# Patient Record
Sex: Male | Born: 1940
Health system: Southern US, Community
[De-identification: ages and names within clinical notes are randomized; demographics above are authoritative.]

## PROBLEM LIST (undated history)

## (undated) DIAGNOSIS — T782XXA Anaphylactic shock, unspecified, initial encounter: Secondary | ICD-10-CM

## (undated) DIAGNOSIS — F17200 Nicotine dependence, unspecified, uncomplicated: Secondary | ICD-10-CM

## (undated) DIAGNOSIS — I6529 Occlusion and stenosis of unspecified carotid artery: Secondary | ICD-10-CM

## (undated) DIAGNOSIS — IMO0002 Reserved for concepts with insufficient information to code with codable children: Secondary | ICD-10-CM

## (undated) DIAGNOSIS — I10 Essential (primary) hypertension: Secondary | ICD-10-CM

## (undated) DIAGNOSIS — J449 Chronic obstructive pulmonary disease, unspecified: Secondary | ICD-10-CM

## (undated) DIAGNOSIS — I739 Peripheral vascular disease, unspecified: Secondary | ICD-10-CM

## (undated) DIAGNOSIS — Z86010 Personal history of colonic polyps: Secondary | ICD-10-CM

## (undated) DIAGNOSIS — K5790 Diverticulosis of intestine, part unspecified, without perforation or abscess without bleeding: Secondary | ICD-10-CM

## (undated) DIAGNOSIS — R892 Abnormal level of other drugs, medicaments and biological substances in specimens from other organs, systems and tissues: Secondary | ICD-10-CM

## (undated) DIAGNOSIS — Z9889 Other specified postprocedural states: Secondary | ICD-10-CM

## (undated) DIAGNOSIS — Z87442 Personal history of urinary calculi: Secondary | ICD-10-CM

## (undated) DIAGNOSIS — K222 Esophageal obstruction: Secondary | ICD-10-CM

## (undated) DIAGNOSIS — K5792 Diverticulitis of intestine, part unspecified, without perforation or abscess without bleeding: Secondary | ICD-10-CM

## (undated) DIAGNOSIS — M1990 Unspecified osteoarthritis, unspecified site: Secondary | ICD-10-CM

## (undated) DIAGNOSIS — G458 Other transient cerebral ischemic attacks and related syndromes: Secondary | ICD-10-CM

## (undated) DIAGNOSIS — K219 Gastro-esophageal reflux disease without esophagitis: Secondary | ICD-10-CM

## (undated) DIAGNOSIS — D1800 Hemangioma unspecified site: Secondary | ICD-10-CM

## (undated) DIAGNOSIS — Z8719 Personal history of other diseases of the digestive system: Secondary | ICD-10-CM

## (undated) DIAGNOSIS — C61 Malignant neoplasm of prostate: Secondary | ICD-10-CM

## (undated) HISTORY — DX: Diverticulosis of intestine, part unspecified, without perforation or abscess without bleeding: K57.90

## (undated) HISTORY — DX: Essential (primary) hypertension: I10

## (undated) HISTORY — DX: Gastro-esophageal reflux disease without esophagitis: K21.9

## (undated) HISTORY — DX: Anaphylactic shock, unspecified, initial encounter: T78.2XXA

## (undated) HISTORY — DX: Personal history of urinary calculi: Z87.442

## (undated) HISTORY — DX: Other transient cerebral ischemic attacks and related syndromes: G45.8

## (undated) HISTORY — DX: Personal history of colonic polyps: Z86.010

## (undated) HISTORY — DX: Personal history of other diseases of the digestive system: Z98.890

## (undated) HISTORY — DX: Occlusion and stenosis of unspecified carotid artery: I65.29

## (undated) HISTORY — DX: Nicotine dependence, unspecified, uncomplicated: F17.200

## (undated) HISTORY — DX: Hemangioma unspecified site: D18.00

## (undated) HISTORY — DX: Esophageal obstruction: K22.2

## (undated) HISTORY — DX: Reserved for concepts with insufficient information to code with codable children: IMO0002

## (undated) HISTORY — DX: Malignant neoplasm of prostate: C61

## (undated) HISTORY — DX: Peripheral vascular disease, unspecified: I73.9

## (undated) HISTORY — DX: Chronic obstructive pulmonary disease, unspecified: J44.9

## (undated) HISTORY — DX: Personal history of other diseases of the digestive system: Z87.19

## (undated) HISTORY — DX: Abnormal level of other drugs, medicaments and biological substances in specimens from other organs, systems and tissues: R89.2

## (undated) HISTORY — DX: Unspecified osteoarthritis, unspecified site: M19.90

## (undated) HISTORY — PX: TONSILLECTOMY: SUR1361

## (undated) HISTORY — DX: Diverticulitis of intestine, part unspecified, without perforation or abscess without bleeding: K57.92

---

## 1959-12-05 HISTORY — PX: SHOULDER SURGERY: SHX246

## 2003-03-20 ENCOUNTER — Encounter: Payer: Self-pay | Admitting: Emergency Medicine

## 2003-03-20 ENCOUNTER — Emergency Department (HOSPITAL_COMMUNITY): Admission: EM | Admit: 2003-03-20 | Discharge: 2003-03-20 | Payer: Self-pay | Admitting: Emergency Medicine

## 2003-05-01 ENCOUNTER — Ambulatory Visit (HOSPITAL_COMMUNITY): Admission: RE | Admit: 2003-05-01 | Discharge: 2003-05-01 | Payer: Self-pay | Admitting: Gastroenterology

## 2003-05-14 ENCOUNTER — Encounter: Payer: Self-pay | Admitting: Gastroenterology

## 2003-05-14 ENCOUNTER — Encounter: Admission: RE | Admit: 2003-05-14 | Discharge: 2003-05-14 | Payer: Self-pay | Admitting: Gastroenterology

## 2004-03-22 ENCOUNTER — Encounter: Admission: RE | Admit: 2004-03-22 | Discharge: 2004-03-22 | Payer: Self-pay | Admitting: Family Medicine

## 2004-10-10 ENCOUNTER — Encounter: Admission: RE | Admit: 2004-10-10 | Discharge: 2004-10-10 | Payer: Self-pay | Admitting: Internal Medicine

## 2004-10-17 ENCOUNTER — Ambulatory Visit: Payer: Self-pay

## 2005-12-04 HISTORY — PX: OTHER SURGICAL HISTORY: SHX169

## 2006-02-01 ENCOUNTER — Encounter: Admission: RE | Admit: 2006-02-01 | Discharge: 2006-02-01 | Payer: Self-pay | Admitting: Internal Medicine

## 2006-10-01 ENCOUNTER — Ambulatory Visit (HOSPITAL_COMMUNITY): Admission: RE | Admit: 2006-10-01 | Discharge: 2006-10-01 | Payer: Self-pay | Admitting: Gastroenterology

## 2006-12-04 DIAGNOSIS — K5790 Diverticulosis of intestine, part unspecified, without perforation or abscess without bleeding: Secondary | ICD-10-CM

## 2006-12-04 HISTORY — DX: Diverticulosis of intestine, part unspecified, without perforation or abscess without bleeding: K57.90

## 2007-04-09 ENCOUNTER — Encounter: Admission: RE | Admit: 2007-04-09 | Discharge: 2007-04-09 | Payer: Self-pay | Admitting: Internal Medicine

## 2007-08-26 ENCOUNTER — Encounter: Payer: Self-pay | Admitting: Internal Medicine

## 2007-08-26 ENCOUNTER — Encounter
Admission: RE | Admit: 2007-08-26 | Discharge: 2007-08-26 | Payer: Self-pay | Admitting: Physical Medicine and Rehabilitation

## 2007-09-02 ENCOUNTER — Encounter
Admission: RE | Admit: 2007-09-02 | Discharge: 2007-09-02 | Payer: Self-pay | Admitting: Physical Medicine and Rehabilitation

## 2007-09-13 ENCOUNTER — Encounter
Admission: RE | Admit: 2007-09-13 | Discharge: 2007-09-13 | Payer: Self-pay | Admitting: Physical Medicine and Rehabilitation

## 2007-10-08 ENCOUNTER — Encounter
Admission: RE | Admit: 2007-10-08 | Discharge: 2007-10-08 | Payer: Self-pay | Admitting: Physical Medicine and Rehabilitation

## 2007-10-22 ENCOUNTER — Encounter: Payer: Self-pay | Admitting: Internal Medicine

## 2008-02-21 ENCOUNTER — Ambulatory Visit: Admission: RE | Admit: 2008-02-21 | Discharge: 2008-05-21 | Payer: Self-pay | Admitting: Radiation Oncology

## 2008-08-03 ENCOUNTER — Encounter: Payer: Self-pay | Admitting: Internal Medicine

## 2008-08-11 ENCOUNTER — Encounter: Payer: Self-pay | Admitting: Internal Medicine

## 2008-09-11 ENCOUNTER — Ambulatory Visit: Payer: Self-pay | Admitting: Vascular Surgery

## 2008-09-11 ENCOUNTER — Encounter: Payer: Self-pay | Admitting: Internal Medicine

## 2008-09-11 ENCOUNTER — Encounter: Admission: RE | Admit: 2008-09-11 | Discharge: 2008-09-11 | Payer: Self-pay | Admitting: Family Medicine

## 2008-09-11 ENCOUNTER — Encounter: Payer: Self-pay | Admitting: Family Medicine

## 2008-09-11 ENCOUNTER — Ambulatory Visit: Admission: RE | Admit: 2008-09-11 | Discharge: 2008-09-11 | Payer: Self-pay | Admitting: Family Medicine

## 2008-09-22 ENCOUNTER — Encounter: Admission: RE | Admit: 2008-09-22 | Discharge: 2008-09-22 | Payer: Self-pay | Admitting: Family Medicine

## 2008-09-24 ENCOUNTER — Encounter: Payer: Self-pay | Admitting: Internal Medicine

## 2009-05-30 ENCOUNTER — Encounter: Payer: Self-pay | Admitting: Internal Medicine

## 2009-05-30 ENCOUNTER — Emergency Department (HOSPITAL_COMMUNITY): Admission: EM | Admit: 2009-05-30 | Discharge: 2009-05-30 | Payer: Self-pay | Admitting: Emergency Medicine

## 2009-06-01 ENCOUNTER — Telehealth (INDEPENDENT_AMBULATORY_CARE_PROVIDER_SITE_OTHER): Payer: Self-pay

## 2009-06-04 ENCOUNTER — Telehealth: Payer: Self-pay | Admitting: Internal Medicine

## 2009-06-04 ENCOUNTER — Encounter: Admission: RE | Admit: 2009-06-04 | Discharge: 2009-06-04 | Payer: Self-pay | Admitting: Internal Medicine

## 2009-06-04 DIAGNOSIS — K222 Esophageal obstruction: Secondary | ICD-10-CM | POA: Insufficient documentation

## 2009-06-08 ENCOUNTER — Ambulatory Visit: Payer: Self-pay | Admitting: Internal Medicine

## 2009-06-08 ENCOUNTER — Ambulatory Visit (HOSPITAL_COMMUNITY): Admission: RE | Admit: 2009-06-08 | Discharge: 2009-06-08 | Payer: Self-pay | Admitting: Internal Medicine

## 2009-06-08 HISTORY — PX: UPPER GASTROINTESTINAL ENDOSCOPY: SHX188

## 2009-06-24 ENCOUNTER — Encounter: Payer: Self-pay | Admitting: Internal Medicine

## 2009-06-30 ENCOUNTER — Encounter: Admission: RE | Admit: 2009-06-30 | Discharge: 2009-06-30 | Payer: Self-pay | Admitting: Family Medicine

## 2009-09-27 ENCOUNTER — Telehealth: Payer: Self-pay | Admitting: Internal Medicine

## 2009-12-04 DIAGNOSIS — C61 Malignant neoplasm of prostate: Secondary | ICD-10-CM

## 2009-12-04 HISTORY — DX: Malignant neoplasm of prostate: C61

## 2010-02-14 LAB — TSH: TSH: 0.783

## 2010-02-14 LAB — VITAMIN D 25 HYDROXY (VIT D DEFICIENCY, FRACTURES): Vit D, 25-Hydroxy: 25

## 2010-02-14 LAB — PSA: PSA: 1.94 ng/mL

## 2010-02-14 LAB — VITAMIN B12: Vitamin B12 Deficiency Panel: 272

## 2010-02-14 LAB — VITAMIN D PNL(25-HYDRXY+1,25-DIHY)-BLD: Vit D, 25-Hydroxy: 25

## 2010-04-07 ENCOUNTER — Ambulatory Visit: Payer: Self-pay | Admitting: Vascular Surgery

## 2010-10-04 HISTORY — PX: SPIROMETRY: SHX456

## 2010-10-19 LAB — COMPREHENSIVE METABOLIC PANEL
ALT: 20 U/L (ref 10–40)
AST: 23 U/L
Albumin: 4.4
Alkaline Phosphatase: 78 U/L
BUN: 8 mg/dL (ref 4–21)
Calcium: 9.8 mg/dL
Creat: 0.86
Glucose: 108
Potassium: 4.5 mmol/L
Sodium: 140 mmol/L (ref 137–147)
Total Bilirubin: 0.5 mg/dL
Total Protein: 6.9 g/dL

## 2010-10-19 LAB — CBC
Hemoglobin: 14.9 g/dL (ref 13.5–17.5)
WBC: 7.8
platelet count: 244

## 2010-10-19 LAB — LIPID PANEL
Cholesterol, Total: 183
Direct LDL: 90
HDL: 71 mg/dL — AB (ref 35–70)
Triglycerides: 111

## 2010-10-19 LAB — PULMONARY FUNCTION TEST

## 2010-11-03 ENCOUNTER — Ambulatory Visit: Payer: Self-pay | Admitting: Vascular Surgery

## 2010-12-04 DIAGNOSIS — K222 Esophageal obstruction: Secondary | ICD-10-CM

## 2010-12-04 HISTORY — DX: Esophageal obstruction: K22.2

## 2010-12-25 ENCOUNTER — Encounter: Payer: Self-pay | Admitting: Family Medicine

## 2011-01-05 NOTE — Procedures (Signed)
Summary: EGD/[facility]  EGD/[facility]   Imported By: Lester [place] 07/14/2009 08:56:56  _____________________________________________________________________  External Attachment:    Type:   Image     Comment:   External Document

## 2011-01-05 NOTE — Progress Notes (Signed)
Summary: Schedule Barium swallow  Phone Note Outgoing Call Call back at [facility] Phone [phone]   Call placed by: Darcey Nora RN,  June 01, 2009 12:15 PM Call placed to: Patient Summary of Call: Patient scheduled for BS with tablet at [facility] [address] for 06-04-09 11:15 arrival.  Patient  will contact Dr Ernestene Mention office and have records faxed here for the last 2 years.  If they are unable to help him he will come here or there and sign a release  New Problems: DYSPHAGIA UNSPECIFIED (ICD-787.20)   New Problems: DYSPHAGIA UNSPECIFIED (ICD-787.20)

## 2011-01-05 NOTE — Consult Note (Signed)
Summary: Consultation Report/ER consult  Consultation Report/ER consult   Imported By: Christie Nottingham CMA 06/01/2009 12:22:40  _____________________________________________________________________  External Attachment:    Type:   Image     Comment:   External Document

## 2011-01-05 NOTE — Procedures (Signed)
Summary: EGD   EGD  Procedure date:  06/08/2009  Findings:      Location: [facility]    ENDOSCOPY PROCEDURE REPORT  PATIENT:  Parker Martinez, Parker Martinez  MR#:  [id number] BIRTHDATE:   May 10, 1941, 68 yrs. old   GENDER:   male  ENDOSCOPIST:   Iva Boop, MD, [facility]    PROCEDURE DATE:  06/08/2009 PROCEDURE:  EGD with balloon dilatation ASA CLASS:   Class II INDICATIONS: 1) dysphagia  2) dilation of esophageal stricture (years of intermittent dysphagia and food impactions, stricture on recent barium swallow)  MEDICATIONS:    Fentanyl 100 mcg IV, Versed 5 mg IV TOPICAL ANESTHETIC:   Cetacaine Spray  DESCRIPTION OF PROCEDURE:   After the risks benefits and alternatives of the procedure were thoroughly explained, informed consent was obtained.  The EG-[id number] ([id number]) endoscope was introduced through the mouth and advanced to the second portion of the duodenum, without limitations.  The instrument was slowly withdrawn as the mucosa was carefully examined. <<PROCEDUREIMAGES>>            <<OLD IMAGES>>  A stricture was found in the distal esophagus. Ring-like stricture 2-3 cm above GE junction. Gastroscope would not pass until dilation accomplished.  Duodenitis was found in the bulb and descending duodenum. Some patchy erythema.  The examination was otherwise normal.    Dilation was then performed at the distal esophagus  1) Dilator:   Balloon   Size(s):   15, 16.5 Resistance:   moderate   Heme:   yes Appearance:   satisfactory heme as expected with dilation  COMPLICATIONS:   None  ENDOSCOPIC IMPRESSION:  1) Stricture in the distal esophagus, dilated to 16.5 mm  2) Duodenitis in the bulb/descending duodenum (mild)  3) Otherwise normal examination. RECOMMENDATIONS:  Clear liquids until 3PM then soft diet. Normal diet tomorrow.  Call Dr. Leone Payor in 3-4 weeks re: is swallowing ok or are there still episodes of swallowing difficulty. If so, then a repeat dilation is  needed.  Continue current medications.  REPEAT EXAM:   No    Iva Boop, MD, [facility]   CC: The Patient

## 2011-01-05 NOTE — Progress Notes (Signed)
Summary: TRIAGE: stricture on Bariium swallow  Phone Note Call from Patient Call back at C#  [phone]   Caller: [facility]  Call For: Parker Martinez  Reason for Call: Talk to Nurse Summary of Call: pt had barium swallow -was told he has a severe esophageal stricture  Initial call taken by: [facility],  June 04, 2009 11:46 AM  Follow-up for Phone Call        I spoke with patient's wife.  I advised her that dictaed report is not ready yet.  I told her I will call back with any recommendations from Dr Leone Payor once he has reviewed scan.  She says she will keep him on a soft diet Follow-up by: Darcey Nora RN,  June 04, 2009 12:18 PM  Additional Follow-up for Phone Call Additional follow up Details #1::        I have reviewed report and images From what I know he is not on Plavix or Coumadin (double check med list and place into [facility], ED note has one list) See if he can have an EGD/dili (no fluoro, likely balloon)Tues afternoon at [facility], could move my 130 LEC case down a bit or do it in between cases, let Steph know as we could schedule others for 7/7 from PM clinic today Chew carefully, small pieces, avoid bread, beef, chicken, other meat pieces Additional Follow-up by: Iva Boop MD,  June 04, 2009 1:19 PM  New Problems: ESOPHAGEAL STRICTURE (ICD-530.3)   Additional Follow-up for Phone Call Additional follow up Details #2::    Patient 's wife notified and given instructions for zeg next week 06-09-09 12:30 Follow-up by: Darcey Nora RN,  June 04, 2009 3:05 PM  New Problems: ESOPHAGEAL STRICTURE (ICD-530.3) New/Updated Medications: PANTOPRAZOLE SODIUM 40 MG  TBEC (PANTOPRAZOLE SODIUM) 1 each day 30 minutes before meal FEXOFENADINE HCL 180 MG TABS (FEXOFENADINE HCL) 1 by mouth qd BABY ASPIRIN 81 MG CHEW (ASPIRIN) 1 by mouth qd FLONASE 50 MCG/ACT SUSP (FLUTICASONE PROPIONATE) 1 spray per nostril PRN

## 2011-01-05 NOTE — Progress Notes (Signed)
Summary: Propanazole  Phone Note Call from Patient Call back at [phone] (cell)   Caller: Patient Call For: Dr. Leone Payor Reason for Call: Talk to Nurse Summary of Call: pt needs refill of Propanazole... [facility] on [address] Initial call taken by: Vallarie Mare,  September 27, 2009 3:22 PM    Prescriptions: PANTOPRAZOLE SODIUM 40 MG  TBEC (PANTOPRAZOLE SODIUM) 1 each day 30 minutes before meal  #30 x 9   Entered by:   Francee Piccolo CMA (AAMA)   Authorized by:   Iva Boop MD, [facility]   Signed by:   Francee Piccolo CMA (AAMA) on 09/27/2009   Method used:   Electronically to        [facility]* (retail)       [address]       [place], [place]  [address]       Ph: [phone]       Fax: [phone]   RxID:   [id number]

## 2011-01-05 NOTE — Letter (Signed)
Summary: [facility]  [facility]   Imported By: Lester Martha 07/14/2009 09:00:06  _____________________________________________________________________  External Attachment:    Type:   Image     Comment:   External Document

## 2011-01-05 NOTE — Letter (Signed)
Summary: [facility]  [facility]   Imported By: Lester Bunkie 07/14/2009 08:55:21  _____________________________________________________________________  External Attachment:    Type:   Image     Comment:   External Document

## 2011-01-05 NOTE — Letter (Signed)
Summary: [facility]  [facility]   Imported By: Lester Darby 07/14/2009 08:58:23  _____________________________________________________________________  External Attachment:    Type:   Image     Comment:   External Document

## 2011-03-13 LAB — POCT I-STAT, CHEM 8
BUN: 9 mg/dL (ref 6–23)
Calcium, Ion: 1.05 mmol/L — ABNORMAL LOW (ref 1.12–1.32)
Chloride: 108 mEq/L (ref 96–112)
Creatinine, Ser: 0.8 mg/dL (ref 0.4–1.5)
Glucose, Bld: 148 mg/dL — ABNORMAL HIGH (ref 70–99)
HCT: 55 % — ABNORMAL HIGH (ref 39.0–52.0)
Hemoglobin: 18.7 g/dL — ABNORMAL HIGH (ref 13.0–17.0)
Potassium: 4.2 mEq/L (ref 3.5–5.1)
Sodium: 142 mEq/L (ref 135–145)
TCO2: 22 mmol/L (ref 0–100)

## 2011-04-18 NOTE — Consult Note (Signed)
VASCULAR SURGERY CONSULTATION   Parker Martinez, Parker Martinez.  DOB:  [birth year]                                       04/07/2010  ZOXWR#:[id number]   HISTORY:  This is a pleasant 70 year old gentleman who was referred with  peripheral vascular disease by Dr. Perrin Maltese.  He states that for years he  has had claudication symptoms in his right calf.  Over the last 6  months, these symptoms have gradually progressed.  He experiences calf  claudication only and has not had any thigh or hip claudication on the  right and has had no claudication symptoms on the left.  His symptoms  are brought on by ambulation and relieved with rest.  They occur at  approximately one city block.   PAST MEDICAL HISTORY:  Fairly unremarkable.  He does have a history of  prostate cancer which is being followed.  He denies any history of  diabetes, hypertension, hypercholesterolemia, history of previous  myocardial infarction, history of congestive heart failure or history of  COPD.   FAMILY HISTORY:  There is no history of premature cardiovascular  disease.   SOCIAL HISTORY:  He is married.  He has one child.  He smokes a pack per  day of cigarettes and has been smoking for 55 years.  He has 3 to 4  drinks a day.   REVIEW OF SYSTEMS:  GENERAL:  He has had no recent weight loss, weight  gain, or problems with his appetite.  CARDIOVASCULAR:  He had no chest pain, chest pressure, palpitations or  arrhythmias.  He has had no orthopnea.  He does admit to dyspnea on  exertion.  GI:  He has had a history of reflux.  GU:  He has some urinary frequency.  MUSCULOSKELETAL:  He has occasional joint pain.  ENT:  He has had some loss of his hearing recently.  He has had no  change in eye site, no nosebleeds or sore throat.  PULMONARY, NEUROLOGIC, PSYCHIATRIC, HEMATOLOGIC, AND INTEGUMENTARY:  Review of systems is unremarkable and is documented on the medical  history form in his chart.   PHYSICAL EXAMINATION:   This is a pleasant 70 year old gentleman who  appears his stated age.  Blood pressure is 129/74, heart rate is 69,  saturation 100%.  Temperature is 97.8.  HEENT:  Unremarkable.  Lungs:  Clear bilaterally to auscultation without rales, rhonchi or wheezing.  Cardiovascular:  I do not detect any carotid bruits.  Has a regular rate  and rhythm.  He has no significant peripheral edema.  He has palpable  femoral pulses and a palpable left popliteal, left dorsalis pedis and  posterior tibial pulse.  His pedal pulses on the left are diminished.  I  cannot palpate popliteal or pedal pulses on the right side.  Abdomen:  Soft and nontender.  I cannot appreciate an aneurysm.  He has normal  pitched bowel sounds.  Musculoskeletal:  No major deformities or  cyanosis.  Neurologic:  He has no focal weakness or paresthesias.  Skin:  There are no ulcers or rashes.   I have independently interpreted his arterial Doppler study which shows  monophasic Doppler signals in the right foot with an ABI of 75%.  On the  left side he has biphasic Doppler signals in the posterior tibial and  dorsalis pedis position with an ABI of 100%.  Based on his exam, he has evidence of a superficial femoral artery  occlusion on the right.  He has a stable claudication with no rest pain  and no evidence of limb-threatening ischemia.  We have discussed the  importance of tobacco cessation and also the importance of getting on a  structured walking program.  I have explained that generally we would  not consider arteriography and revascularization unless he developed  disabling claudication, rest pain or nonhealing ulcers.  I plan on  seeing him back in 6 months with followup ABIs.  He knows to call sooner  if he has problems.     Di Kindle. Edilia Bo, M.D.  Electronically Signed  CSD/MEDQ  D:  04/07/2010  T:  04/08/2010  Job:  [id number]   cc:   Jonita Albee, M.D.

## 2011-04-21 NOTE — Op Note (Signed)
Parker Martinez, Parker Martinez                          ACCOUNT NO.:  [id number]   MEDICAL RECORD NO.:  [id number]                   PATIENT TYPE:  AMB   LOCATION:  ENDO                                 FACILITY:  [facility]   PHYSICIAN:  Anselmo Rod, M.D.               DATE OF BIRTH:  [birth year]   DATE OF PROCEDURE:  05/01/2003  DATE OF DISCHARGE:                                 OPERATIVE REPORT   PROCEDURE:  Esophagogastroduodenoscopy.   ENDOSCOPIST:  Anselmo Rod, M.D.   INSTRUMENT USED:  Olympus video panendoscope.   INDICATIONS FOR PROCEDURE:  History of dysphagia with meat impaction in 70-year-old white male undergoing EGD.  The patient has a history of  strictures.  Dilatation is planned.   PREPROCEDURE PREPARATION:  Informed consent was procured from the patient.  The patient was fasted for eight hours prior to the procedure.   PREPROCEDURE PHYSICAL EXAMINATION:  VITAL SIGNS: Stable.  NECK:  Supple.  CHEST:  Clear to auscultation.  S1 and S2 regular.  ABDOMEN:  Soft with normal bowel sounds.   DESCRIPTION OF PROCEDURE:  The patient was placed in the left lateral  decubitus position and sedated with 50 mg of Demerol and 5 mg of Versed  intravenously.  Once the patient was adequately sedated and maintained on  low flow oxygen and continuous cardiac monitoring, the Olympus video  panendoscope was advanced through the mouthpiece, over the tongue, and into  the esophagus under direct vision.  The entire esophagus appeared widely  patent with no evidence of ring, stricture, masses, esophagitis, or  Barrett's mucosa.  The scope was easily advanced through the stomach.  Retroflexion in the high cardia revealed no abnormalities.  The entire  gastric mucosa appeared normal except for mild antral gastritis.  There was  a prominent fold in the duodenal bulb which may be a duodenal varix, but I  am doubtful about this.  Small bowel distal to the above appeared normal up  to 60 cm.   Duodenal bulb appeared healthy with no evidence of erosions or  ulcerations.   IMPRESSION:  1. Widely patient esophagus, no strictures seen, no dilatation required.  2. Mild antral gastritis.  3. No hiatal hernia on retroflexion.  4. Prominent fold in the duodenum questionable varix.    RECOMMENDATIONS:  Outpatient follow-up for further recommendations and  possible CT scan of the abdomen and to further evaluate abnormalities seen  on EGD.  Barium swallow as the patient seems to have persistent symptoms.  Continue PPI's.  Avoid nonsteroidals and follow antireflux measures.  Outpatient follow-up in the next two weeks for further recommendations.                                               Jyothi Nat  Loreta Ave, M.D.    JNM/MEDQ  D:  05/01/2003  T:  05/02/2003  Job:  [id number]   cc:   Christella Noa, M.D.  [address] [place]  [place], [place] [address]  Fax: [phone]

## 2011-04-21 NOTE — Op Note (Signed)
Parker Martinez, Parker Martinez              ACCOUNT NO.:  [id number]   MEDICAL RECORD NO.:  [id number]          PATIENT TYPE:  AMB   LOCATION:  ENDO                         FACILITY:  [facility]   PHYSICIAN:  Anselmo Rod, M.D.  DATE OF BIRTH:  1941/03/09   DATE OF PROCEDURE:  10/01/2006  DATE OF DISCHARGE:  10/01/2006                                 OPERATIVE REPORT   PROCEDURE PERFORMED:  Esophagogastroduodenoscopy with disimpaction of a meat  bolus.   ENDOSCOPIST:  Anselmo Rod, M.D.   INSTRUMENT USED:  Olympus video panendoscope.   INDICATION FOR PROCEDURE:  A 70 year old white male with a history of a meat  impaction since 7 p.m. yesterday, undergoing emergency EGD for disimpaction  purposes.   PREPROCEDURE PREPARATION:  Informed consent was procured from the patient.  The patient was in a fasting state for over 12 hours, as he could not  swallow his own secretions.  Risks and benefits of the procedure were  discussed with the patient in great detail.   PREPROCEDURE PHYSICAL:  VITAL SIGNS:  The patient had stable vital signs.  NECK:  Supple.  CHEST:  Clear to auscultation.  CARDIAC:  S1 and S2 regular.  ABDOMEN:  Soft with normal bowel sounds.   DESCRIPTION OF THE PROCEDURE:  The patient was placed in the left lateral  decubitus position and sedated with fentanyl and Versed.  Once the patient  was adequately sedated and maintained on low-flow oxygen and continuous  cardiac monitoring, the Olympus video panendoscope was advanced through the  mouthpiece, over the tongue and into the esophagus under direct vision.  A  large meat bolus was impacted at the GE junction.  Forceps were taken and  gently passed to the meat bolus and this was fragmented up into smaller  pieces and washed into the stomach with gentle flushes.  The gastric mucosa  was then visualized and a small hiatal hernia was seen on high retroflexion.  There was some debris in the distal stomach and therefore the  proximal small  bowel could not be visualized.  The patient tolerated the procedure well  without immediate complications.   IMPRESSION:  1. Large meat bolus impacted at gastroesophageal junction, gently broken      up with the forceps and pushed into the stomach.  2. Small hiatal hernia.  3. Proximal small bowel not visualized.  4. Severe esophagitis noted after disimpaction of meat bolus in the distal      esophagus (grade 3 esophagitis).   RECOMMENDATIONS:  1. Continue Prevacid, but increase the dosage to 30 mg b.i.d.  2. Carafate suspension 1 g q.i.d. has been prescribed to be taken in      between meals and at bedtime.  3. Avoid rare meats and drink liberal amount of fluids with meals.  4. Outpatient followup in the next 2 weeks for further recommendations.      Anselmo Rod, M.D.  Electronically Signed     JNM/MEDQ  D:  10/04/2006  T:  10/05/2006  Job:  [id number]   cc:   Sharlot Gowda, M.D.

## 2011-06-28 ENCOUNTER — Ambulatory Visit: Payer: Self-pay

## 2011-06-30 ENCOUNTER — Other Ambulatory Visit: Payer: Self-pay | Admitting: Gastroenterology

## 2011-06-30 MED ORDER — PANTOPRAZOLE SODIUM 40 MG PO TBEC: DELAYED_RELEASE_TABLET | ORAL | Status: DC

## 2011-07-24 ENCOUNTER — Encounter (HOSPITAL_COMMUNITY): Payer: Self-pay

## 2011-08-04 ENCOUNTER — Other Ambulatory Visit: Payer: Self-pay | Admitting: Gastroenterology

## 2011-08-04 ENCOUNTER — Other Ambulatory Visit: Payer: Self-pay | Admitting: Internal Medicine

## 2011-08-04 NOTE — Telephone Encounter (Signed)
Denied refill for Pantoprazole 40 mg #30. The patient needs an appointment.

## 2011-08-08 MED ORDER — PANTOPRAZOLE SODIUM 40 MG PO TBEC: DELAYED_RELEASE_TABLET | ORAL | Status: DC

## 2011-08-08 NOTE — Telephone Encounter (Signed)
Patient scheduled an appointment. Medication filled until appointment.

## 2011-08-09 ENCOUNTER — Other Ambulatory Visit: Payer: Self-pay | Admitting: Internal Medicine

## 2011-08-09 NOTE — Telephone Encounter (Signed)
Talked to pharmacy and they did receive the medication refill from 08/08/11.

## 2011-09-13 ENCOUNTER — Ambulatory Visit (INDEPENDENT_AMBULATORY_CARE_PROVIDER_SITE_OTHER): Payer: PRIVATE HEALTH INSURANCE | Admitting: Internal Medicine

## 2011-09-13 ENCOUNTER — Encounter: Payer: Self-pay | Admitting: Internal Medicine

## 2011-09-13 DIAGNOSIS — F17200 Nicotine dependence, unspecified, uncomplicated: Secondary | ICD-10-CM | POA: Insufficient documentation

## 2011-09-13 DIAGNOSIS — Z87891 Personal history of nicotine dependence: Secondary | ICD-10-CM | POA: Insufficient documentation

## 2011-09-13 DIAGNOSIS — K222 Esophageal obstruction: Secondary | ICD-10-CM

## 2011-09-13 MED ORDER — PANTOPRAZOLE SODIUM 40 MG PO TBEC: DELAYED_RELEASE_TABLET | ORAL | Status: DC

## 2011-09-13 NOTE — Patient Instructions (Addendum)
You have been given a separate informational sheet regarding your tobacco use, the importance of quitting and local resources to help you quit. Your prescription(s) has(have) been sent to your pharmacy for you to pick up. If your swallowing gets worse call back for repeat dilation. Please reduce your daily caffeine intake.

## 2011-09-13 NOTE — Assessment & Plan Note (Signed)
He is aware of problem but not willing to quit Wife smokes also  I advised they quit together

## 2011-09-13 NOTE — Assessment & Plan Note (Signed)
Refill PPI Was advised to call for repeat dilaton if deterioration Reduce caffeine Quit smoking

## 2011-09-13 NOTE — Progress Notes (Signed)
  Subjective:    Patient ID: Parker Martinez, male    DOB: 1941-08-03, 70 y.o.   MRN: [id number]  HPI70 yo wm with GERD and esophageal stricture s/p dilation in 2010. Doing well without impact dysphagia except rare instance of fullness if eats too fast. Last episode 1-2 months ago. < 6 episodes a year. Heartburn controlled if takes PPI. Usually 2 cups of coffee daily sometimes more. Still smokes and not ready to quit.     Review of Systems As above.     Objective:   Physical Exam WDWN Lungs: coarse BS and decreased with prolonged expiration Heart distant S1St abd soft and nontender       Assessment & Plan:  He was advised to pursue repeat CT colonoscopy as last was 02/2011

## 2012-01-09 ENCOUNTER — Other Ambulatory Visit: Payer: Self-pay

## 2012-01-28 ENCOUNTER — Encounter (HOSPITAL_BASED_OUTPATIENT_CLINIC_OR_DEPARTMENT_OTHER): Payer: Self-pay | Admitting: *Deleted

## 2012-01-28 ENCOUNTER — Ambulatory Visit (INDEPENDENT_AMBULATORY_CARE_PROVIDER_SITE_OTHER): Payer: PRIVATE HEALTH INSURANCE | Admitting: Emergency Medicine

## 2012-01-28 ENCOUNTER — Emergency Department (HOSPITAL_BASED_OUTPATIENT_CLINIC_OR_DEPARTMENT_OTHER)
Admission: EM | Admit: 2012-01-28 | Discharge: 2012-01-28 | Discharge status: Against Medical Advice | Payer: PRIVATE HEALTH INSURANCE | Attending: Emergency Medicine | Admitting: Emergency Medicine

## 2012-01-28 VITALS — BP 165/81 | HR 163 | Resp 30

## 2012-01-28 DIAGNOSIS — K219 Gastro-esophageal reflux disease without esophagitis: Secondary | ICD-10-CM | POA: Insufficient documentation

## 2012-01-28 DIAGNOSIS — R220 Localized swelling, mass and lump, head: Secondary | ICD-10-CM | POA: Insufficient documentation

## 2012-01-28 DIAGNOSIS — T7840XA Allergy, unspecified, initial encounter: Secondary | ICD-10-CM

## 2012-01-28 DIAGNOSIS — J449 Chronic obstructive pulmonary disease, unspecified: Secondary | ICD-10-CM | POA: Insufficient documentation

## 2012-01-28 DIAGNOSIS — R0902 Hypoxemia: Secondary | ICD-10-CM

## 2012-01-28 DIAGNOSIS — J4489 Other specified chronic obstructive pulmonary disease: Secondary | ICD-10-CM | POA: Insufficient documentation

## 2012-01-28 DIAGNOSIS — T782XXA Anaphylactic shock, unspecified, initial encounter: Secondary | ICD-10-CM

## 2012-01-28 DIAGNOSIS — R000 Tachycardia, unspecified: Secondary | ICD-10-CM

## 2012-01-28 MED ORDER — NON FORMULARY: 10.0000 mg | Freq: Once | Status: DC

## 2012-01-28 MED ORDER — EPINEPHRINE 0.3 MG/0.3ML IJ DEVI
0.3000 mg | Freq: Once | INTRAMUSCULAR | Status: AC
  Administered 2012-01-28: 0.3 mg via SUBCUTANEOUS

## 2012-01-28 MED ORDER — NON FORMULARY
10.0000 mg | Freq: Once | Status: AC
  Administered 2012-01-28: 10 mg via ORAL

## 2012-01-28 MED ORDER — EPINEPHRINE 0.3 MG/0.3ML IJ DEVI: 0.3000 mg | Freq: Once | INTRAMUSCULAR | Status: DC

## 2012-01-28 MED ORDER — NON FORMULARY
150.0000 mg | Freq: Once | Status: DC
  Administered 2012-01-28: 150 mg via ORAL

## 2012-01-28 MED ORDER — NON FORMULARY
150.0000 mg | Freq: Once | Status: AC
  Administered 2012-01-28: 150 mg via ORAL

## 2012-01-28 MED ORDER — METHYLPREDNISOLONE SODIUM SUCC 125 MG IJ SOLR: 125.0000 mg | Freq: Once | INTRAMUSCULAR | Status: DC

## 2012-01-28 MED ORDER — METHYLPREDNISOLONE SODIUM SUCC 125 MG IJ SOLR
125.0000 mg | Freq: Once | INTRAMUSCULAR | Status: AC
  Administered 2012-01-28: 125 mg via INTRAVENOUS

## 2012-01-28 MED ORDER — ALBUTEROL SULFATE (2.5 MG/3ML) 0.083% IN NEBU
2.5000 mg | INHALATION_SOLUTION | Freq: Once | RESPIRATORY_TRACT | Status: AC
  Administered 2012-01-28: 2.5 mg via RESPIRATORY_TRACT

## 2012-01-28 NOTE — ED Notes (Signed)
MD at bedside. 

## 2012-01-28 NOTE — Progress Notes (Signed)
  Subjective:    Patient ID: Parker Martinez, male    DOB: [birth year], 71 y.o.   MRN: [id number]  HPI Was brought back urgently complaining of shortness of breath and anaphylaxis due to unknown trigger. He was wheezing, short of breath and developing hives. He had run out of his EpiPen and had not administered epinephrine at home. He had already taken Benadryl by mouth prior to arrival. He has a history of recurrent anaphylaxis and had been evaluated at the [employer] of health without trigger found.   Review of Systems As above    Objective:   Physical Exam Patient was brought back urgently and several providers brought into the examination room to care for this patient. EMS was contacted. Vital signs were noted. Urticarial rash noted on arms and trunk. No posterior pharyngeal edema. Bilateral wheezes on respiratory exam. Tachycardia without murmur.     Assessment & Plan:  Please see emergency care sheet as part of this visit were found in the media section of chart review. The patient was placed on oxygen via nasal cannula, given epi via EpiPen, Zantac 300 mg and Zyrtec 10 mg were given by mouth. Solu-Medrol 125 mg was administered through IV. He also received an albuterol neb treatment. EMS arrived promptly patient was transported to the emergency department.  The care was delivered by Dr. Cleta Alberts and Eddie Candle, PA-C.

## 2012-01-28 NOTE — ED Notes (Signed)
Pt seen at [facility] earlier for anaphylactic reaction. Treated with Solumedrol, Epi, Ranitidine, Neb tx and Benadryl. States he is fine now. Occasional cough. BBS slightly diminished. No distress.

## 2012-01-28 NOTE — ED Provider Notes (Signed)
History     CSN: [id number]  Arrival date & time 01/28/12  1322   First MD Initiated Contact with Patient 01/28/12 1340      Chief Complaint  Patient presents with  . Allergic Reaction    (Consider location/radiation/quality/duration/timing/severity/associated sxs/prior treatment) HPI Patient presents via EMS after being treated at [facility] urgent care prior to arrival for presumed anaphylactic reaction. Patient states he has had multiple events similar to this in the past. He describes swelling and itching of his hands followed by onset of itching and swelling of his throat. He denies any difficulty breathing or lip or tongue swelling. He denies vomiting or syncope. He states that he has an EpiPen but did not use it as he went directly to urgent care for treatment. He did take 50 mg of Benadryl. At urgent care he was given Solu-Medrol and epinephrine injection and transferred to the ED period patient denies any symptoms currently and states that all of his prior symptoms have now resolved.  There are no other associated systemic symptoms.  Other alleviating or modifying factors are unknown besides those mentioned in HPI.   Past Medical History  Diagnosis Date  . Allergic rhinitis   . Status post dilatation of esophageal stricture   . Duodenitis   . COPD (chronic obstructive pulmonary disease)   . Prostate cancer   . GERD (gastroesophageal reflux disease)   . Kidney stones     Past Surgical History  Procedure Date  . Upper gastrointestinal endoscopy 06/08/2009    dilated esophageal stricture, duodenitis    Family History  Problem Relation Age of Onset  . Lung cancer Father     History  Substance Use Topics  . Smoking status: Current Everyday Smoker -- 1.0 packs/day    Types: Cigarettes  . Smokeless tobacco: Never Used   Comment: would like to quit not ready to.  . Alcohol Use: 7.2 oz/week    12 Cans of beer per week     socially      Review of Systems ROS reviewed  and otherwise negative except for mentioned in HPI  Allergies  Review of patient's allergies indicates no known allergies.  Home Medications   Current Outpatient Rx  Name Route Sig Dispense Refill  . ASPIRIN 81 MG PO TABS Oral Take 81 mg by mouth daily.      Marland Kitchen FEXOFENADINE HCL 180 MG PO TABS Oral Take 180 mg by mouth daily.      Marland Kitchen HYDROCODONE-ACETAMINOPHEN 5-325 MG PO TABS Oral Take 1 tablet by mouth every 6 (six) hours as needed.    Marland Kitchen PANTOPRAZOLE SODIUM 40 MG PO TBEC  Take 1 tablet by mouth each day 30 minutes before a meal 30 tablet 11    BP 133/70  Pulse 110  Temp(Src) 98.2 F (36.8 C) (Oral)  Resp 20  Ht 5\' 10"  (1.778 m)  Wt 170 lb (77.111 kg)  BMI 24.39 kg/m2  SpO2 96% Vitals reviewed Physical Exam Physical Examination: General appearance - alert, well appearing, and in no distress Mental status - alert, oriented to person, place, and time Eyes - pupils equal and reactive, no conjunctival injection Mouth - mucous membranes moist, pharynx normal without lesions, no lip or tongue swelling, palate symmetric Chest - clear to auscultation, no wheezes, rales or rhonchi, symmetric air entry, no stridor, no increased respiratory effort Heart - normal rate, regular rhythm, normal S1, S2, no murmurs, rubs, clicks or gallops Extremities - peripheral pulses normal, no pedal edema, no clubbing  or cyanosis Skin - normal coloration and turgor, no rashes  ED Course  Procedures (including critical care time)  Labs Reviewed - No data to display No results found.   1. Allergic reaction       MDM  Pt presents to the ED after treatement with epinephrine and solumedrol for presumed anaphylaxis.  Pt states his symtpoms are entirely resolved. Pt also states that he has had multiple similar episodes in the past and that he is very familiar with signs of worsening.  I have advised that he stay in the ED for observation due to having gotten epinephrine due to concern for possible rebound  effect.  Pt has no lip/tongue swelling or airway involvement on my exam.  After long d/w patient and family, he has decided not to be observed and has left the department AMA.  He agrees to return if his symptoms worsen.        Ethelda Chick, MD 01/30/12 1144

## 2012-01-28 NOTE — ED Notes (Signed)
Pt brought to ED via EMS from [place]. Hx anaphylactic reaction to unknown substance. Her for same. Given meds PTA. No distress at present.

## 2012-01-28 NOTE — ED Notes (Signed)
Pt requesting to leave. States he has been through this 30+times and will return if he has further problems. Dr. aware.

## 2012-02-02 ENCOUNTER — Encounter: Payer: Self-pay | Admitting: Physician Assistant

## 2012-03-12 ENCOUNTER — Telehealth: Payer: Self-pay | Admitting: *Deleted

## 2012-03-12 NOTE — Telephone Encounter (Signed)
PT NEEDS RX FOR PROVENTIL INHALER

## 2012-03-13 MED ORDER — ALBUTEROL SULFATE HFA 108 (90 BASE) MCG/ACT IN AERS: 2.0000 | INHALATION_SPRAY | Freq: Four times a day (QID) | RESPIRATORY_TRACT | Status: DC | PRN

## 2012-03-13 NOTE — Telephone Encounter (Signed)
Notified pt Rx sent in and he is due for OV.

## 2012-03-13 NOTE — Telephone Encounter (Signed)
Needs ov before more are given.  Next time call the pharmacy for Rx refills.

## 2012-03-18 ENCOUNTER — Encounter: Payer: Self-pay | Admitting: Family Medicine

## 2012-03-18 ENCOUNTER — Ambulatory Visit (INDEPENDENT_AMBULATORY_CARE_PROVIDER_SITE_OTHER): Payer: PRIVATE HEALTH INSURANCE | Admitting: Family Medicine

## 2012-03-18 VITALS — BP 164/90 | HR 88 | Temp 98.3°F | Ht 69.5 in | Wt 172.2 lb

## 2012-03-18 DIAGNOSIS — C61 Malignant neoplasm of prostate: Secondary | ICD-10-CM

## 2012-03-18 DIAGNOSIS — J449 Chronic obstructive pulmonary disease, unspecified: Secondary | ICD-10-CM

## 2012-03-18 DIAGNOSIS — K219 Gastro-esophageal reflux disease without esophagitis: Secondary | ICD-10-CM

## 2012-03-18 DIAGNOSIS — T782XXA Anaphylactic shock, unspecified, initial encounter: Secondary | ICD-10-CM

## 2012-03-18 DIAGNOSIS — J4489 Other specified chronic obstructive pulmonary disease: Secondary | ICD-10-CM

## 2012-03-18 DIAGNOSIS — F17200 Nicotine dependence, unspecified, uncomplicated: Secondary | ICD-10-CM

## 2012-03-18 MED ORDER — ALBUTEROL SULFATE HFA 108 (90 BASE) MCG/ACT IN AERS: 2.0000 | INHALATION_SPRAY | Freq: Four times a day (QID) | RESPIRATORY_TRACT | Status: DC | PRN

## 2012-03-18 MED ORDER — HYDROCODONE-ACETAMINOPHEN 5-325 MG PO TABS: 1.0000 | ORAL_TABLET | Freq: Four times a day (QID) | ORAL | Status: DC | PRN

## 2012-03-18 NOTE — Assessment & Plan Note (Signed)
Unknown trigger.  Has epi pen at home.

## 2012-03-18 NOTE — Assessment & Plan Note (Signed)
Contemplative 2/2 cost of cigarettes mainly. Encouraged cessation.

## 2012-03-18 NOTE — Progress Notes (Signed)
Addended by: Eustaquio Boyden on: 03/18/2012 09:20 PM   Modules accepted: Level of Service

## 2012-03-18 NOTE — Assessment & Plan Note (Signed)
Chronic.  Only on albuterol and seems stable although pt endorses some dyspnea and cough at baseline. Unsure if recent PFTs, will request records from [place]. Discussed importance of smoking cessation.

## 2012-03-18 NOTE — Patient Instructions (Addendum)
Keep eye on blood pressure at home.  If consistently >140/90, please let us know and return to be seen. Work on quitting smoking. Return at your convenience for physical, prior fasting for blood work.

## 2012-03-18 NOTE — Progress Notes (Signed)
Subjective:    Patient ID: Parker Martinez, male    DOB: [birth year], 71 y.o.   MRN: [id number]  HPI CC: establish  New pt to establish.  Transferring from [place].  Concerned about communication issues recently there - getting messages through to MD.  BP running high today.  Attributes to meeting new doc.  No h/o HTN.  Mother passed away this year.  No concerns today, would like to establish with new doc.  H/o L5/S1 and C3-4 issues.  Recently worsening neck pain.  Smoking - too expensive.  <1 ppd.  Anaphylaxis reactions in past but unsure to what, has had w/u done for this multiple times.  Pancakes seem to have caused this, ?pizza.  Carries epi pen with him.  Preventative Annual physical done August.  Due. Pneumonia 2012 Prostate - currently with cancer, active surveillance (Dr. [facility] at [facility]) Colon screen - virtual colonoscopy 2007 WNL Leone Payor)  [facility] Payor - virtual colonoscopy 10 yrs ago ENT - Dr. Ramond [facility] - Dr. Fransico Michael  Medications and allergies reviewed and updated in chart.  Past histories reviewed and updated if relevant as below. Patient Active Problem List  Diagnoses  . ESOPHAGEAL STRICTURE  . Active smoker   Past Medical History  Diagnosis Date  . Allergic rhinitis   . Status post dilatation of esophageal stricture   . Duodenitis   . COPD (chronic obstructive pulmonary disease)   . Prostate cancer     active surveillance (Dr. Earlene Plater)  . GERD (gastroesophageal reflux disease)   . History of kidney stones remote  . PAD (peripheral artery disease)   . Anaphylaxis     unknown trigger  . Arthritis     lumbar and cervical spine  . Smoker   . Diverticulosis 2008    by CT  . Hemangioma     left arm   Past Surgical History  Procedure Date  . Upper gastrointestinal endoscopy 06/08/2009    dilated esophageal stricture, duodenitis  . Shoulder surgery 1961    due to chronic dislocation  . Tonsillectomy 1944 ?  Marland Kitchen Virtual colonoscopy 2007    [name], rpt rec 5  yrs   History  Substance Use Topics  . Smoking status: Current Everyday Smoker -- 1.0 packs/day    Types: Cigarettes  . Smokeless tobacco: Never Used   Comment: would like to quit not ready to.  . Alcohol Use: 7.2 oz/week    12 Cans of beer per week     socially   Family History  Problem Relation Age of Onset  . Lung cancer Father   . Coronary artery disease Neg Hx   . Stroke Neg Hx   . Diabetes Neg Hx   . Arthritis Mother    No Known Allergies Current Outpatient Prescriptions on File Prior to Visit  Medication Sig Dispense Refill  . aspirin 81 MG tablet Take 81 mg by mouth daily.        . fexofenadine (ALLEGRA) 180 MG tablet Take 180 mg by mouth daily.        . montelukast (SINGULAIR) 10 MG tablet Take 10 mg by mouth at bedtime.      . pantoprazole (PROTONIX) 40 MG tablet Take 1 tablet by mouth each day 30 minutes before a meal  30 tablet  11  . ranitidine (ZANTAC) 150 MG capsule Take 150 mg by mouth 2 (two) times daily.      Marland Kitchen DISCONTD: albuterol (PROVENTIL HFA;VENTOLIN HFA) 108 (90 BASE) MCG/ACT inhaler Inhale 2 puffs into  the lungs every 6 (six) hours as needed for wheezing.  1 Inhaler  0    Review of Systems  Constitutional: Negative for fever, chills, activity change, appetite change, fatigue and unexpected weight change.  HENT: Negative for hearing loss and neck pain.   Eyes: Negative for visual disturbance.  Respiratory: Positive for cough (h/o COPD) and shortness of breath. Negative for chest tightness and wheezing.   Cardiovascular: Negative for chest pain, palpitations and leg swelling.  Gastrointestinal: Negative for nausea, vomiting, abdominal pain, diarrhea, constipation, blood in stool and abdominal distention.  Genitourinary: Negative for hematuria and difficulty urinating.  Musculoskeletal: Negative for myalgias and arthralgias.  Skin: Negative for rash.  Neurological: Negative for dizziness, seizures, syncope and headaches.  Hematological: Does not  bruise/bleed easily.  Psychiatric/Behavioral: Negative for dysphoric mood. The patient is not nervous/anxious.       Objective:   Physical Exam  Nursing note and vitals reviewed. Constitutional: He is oriented to person, place, and time. He appears well-developed and well-nourished. No distress.  HENT:  Head: Normocephalic and atraumatic.  Right Ear: External ear normal.  Left Ear: External ear normal.  Nose: Nose normal.  Mouth/Throat: Oropharynx is clear and moist. No oropharyngeal exudate.  Eyes: Conjunctivae and EOM are normal. Pupils are equal, round, and reactive to light. No scleral icterus.  Neck: Normal range of motion. Neck supple. No thyromegaly present.  Cardiovascular: Normal rate, regular rhythm, normal heart sounds and intact distal pulses.   No murmur heard. Pulses:      Radial pulses are 2+ on the right side, and 2+ on the left side.  Pulmonary/Chest: Effort normal. No respiratory distress. He has wheezes. He has no rales.       Mild wheezing, coarse, diminished breath sounds  Abdominal: Soft. Bowel sounds are normal. He exhibits no distension and no mass. There is no tenderness. There is no rebound and no guarding.  Musculoskeletal: Normal range of motion. He exhibits no edema.  Lymphadenopathy:    He has no cervical adenopathy.  Neurological: He is alert and oriented to person, place, and time.       CN grossly intact, station and gait intact  Skin: Skin is warm and dry. No rash noted.       Congenital left hand/forearm hemangioma  Psychiatric: He has a normal mood and affect. His behavior is normal. Judgment and thought content normal.       Assessment & Plan:

## 2012-03-28 ENCOUNTER — Other Ambulatory Visit: Payer: Self-pay | Admitting: *Deleted

## 2012-03-28 MED ORDER — MONTELUKAST SODIUM 10 MG PO TABS: 10.0000 mg | ORAL_TABLET | Freq: Every day | ORAL | Status: DC

## 2012-04-02 ENCOUNTER — Telehealth: Payer: Self-pay | Admitting: Family Medicine

## 2012-04-02 ENCOUNTER — Encounter: Payer: Self-pay | Admitting: Family Medicine

## 2012-04-02 MED ORDER — HYDROCHLOROTHIAZIDE 12.5 MG PO CAPS: 12.5000 mg | ORAL_CAPSULE | Freq: Every day | ORAL | Status: DC

## 2012-04-02 NOTE — Telephone Encounter (Signed)
Pt dropped off log of bps ranging 138-158/70-80s.   Please notify I reviewed records and based on numbers, likely recommend start taking blood pressure medicine.  Sent in water pill hctz to pharmacy.  Return in 1-2 months for follow up after abx out of rehab. update [place] if questions.

## 2012-04-03 NOTE — Telephone Encounter (Signed)
Patient returned call and left message. Returned patient's call and left message advising to start Rx. Advised to call back and schedule a follow up for 1-2 months and if he had any questions,concerns or problems.

## 2012-04-03 NOTE — Telephone Encounter (Signed)
Message left for patient to return my call.  

## 2012-04-22 ENCOUNTER — Other Ambulatory Visit: Payer: Self-pay | Admitting: Family Medicine

## 2012-04-22 DIAGNOSIS — Z0000 Encounter for general adult medical examination without abnormal findings: Secondary | ICD-10-CM

## 2012-04-22 DIAGNOSIS — C61 Malignant neoplasm of prostate: Secondary | ICD-10-CM

## 2012-04-23 ENCOUNTER — Other Ambulatory Visit (INDEPENDENT_AMBULATORY_CARE_PROVIDER_SITE_OTHER): Payer: PRIVATE HEALTH INSURANCE

## 2012-04-23 DIAGNOSIS — Z0000 Encounter for general adult medical examination without abnormal findings: Secondary | ICD-10-CM

## 2012-04-23 DIAGNOSIS — C61 Malignant neoplasm of prostate: Secondary | ICD-10-CM

## 2012-04-23 LAB — COMPREHENSIVE METABOLIC PANEL
ALT: 24 U/L (ref 0–53)
AST: 19 U/L (ref 0–37)
Albumin: 4 g/dL (ref 3.5–5.2)
Alkaline Phosphatase: 94 U/L (ref 39–117)
BUN: 7 mg/dL (ref 6–23)
CO2: 31 mEq/L (ref 19–32)
Calcium: 9.7 mg/dL (ref 8.4–10.5)
Chloride: 103 mEq/L (ref 96–112)
Creatinine, Ser: 0.7 mg/dL (ref 0.4–1.5)
GFR: 110.54 mL/min (ref >60.00)
Glucose, Bld: 88 mg/dL (ref 70–99)
Potassium: 4.9 mEq/L (ref 3.5–5.1)
Sodium: 143 mEq/L (ref 135–145)
Total Bilirubin: 0.7 mg/dL (ref 0.3–1.2)
Total Protein: 6.7 g/dL (ref 6.0–8.3)

## 2012-04-23 LAB — CBC WITH DIFFERENTIAL/PLATELET
Basophils Absolute: 0 10*3/uL (ref 0.0–0.1)
Basophils Relative: 0.3 % (ref 0.0–3.0)
Eosinophils Absolute: 0.3 10*3/uL (ref 0.0–0.7)
Eosinophils Relative: 3.4 % (ref 0.0–5.0)
HCT: 52.7 % — ABNORMAL HIGH (ref 39.0–52.0)
Hemoglobin: 17.8 g/dL — ABNORMAL HIGH (ref 13.0–17.0)
Lymphocytes Relative: 31.5 % (ref 12.0–46.0)
Lymphs Abs: 3 10*3/uL (ref 0.7–4.0)
MCHC: 33.8 g/dL (ref 30.0–36.0)
MCV: 97.3 fl (ref 78.0–100.0)
Monocytes Absolute: 0.9 10*3/uL (ref 0.1–1.0)
Monocytes Relative: 9.7 % (ref 3.0–12.0)
Neutro Abs: 5.3 10*3/uL (ref 1.4–7.7)
Neutrophils Relative %: 55.1 % (ref 43.0–77.0)
Platelets: 243 10*3/uL (ref 150.0–400.0)
RBC: 5.42 Mil/uL (ref 4.22–5.81)
RDW: 12.3 % (ref 11.5–14.6)
WBC: 9.6 10*3/uL (ref 4.5–10.5)

## 2012-04-23 LAB — LIPID PANEL
Cholesterol: 211 mg/dL — ABNORMAL HIGH (ref 0–200)
HDL: 60.4 mg/dL (ref >39.00)
Total CHOL/HDL Ratio: 3
Triglycerides: 103 mg/dL (ref 0.0–149.0)
VLDL: 20.6 mg/dL (ref 0.0–40.0)

## 2012-04-23 LAB — PSA: PSA: 2.46 ng/mL (ref 0.10–4.00)

## 2012-04-23 LAB — TSH: TSH: 0.7 u[IU]/mL (ref 0.35–5.50)

## 2012-04-23 LAB — LDL CHOLESTEROL, DIRECT: Direct LDL: 140.8 mg/dL

## 2012-04-25 ENCOUNTER — Ambulatory Visit (INDEPENDENT_AMBULATORY_CARE_PROVIDER_SITE_OTHER): Payer: PRIVATE HEALTH INSURANCE | Admitting: Family Medicine

## 2012-04-25 ENCOUNTER — Encounter: Payer: Self-pay | Admitting: Family Medicine

## 2012-04-25 VITALS — BP 142/86 | HR 88 | Temp 97.9°F | Ht 69.0 in | Wt 167.0 lb

## 2012-04-25 DIAGNOSIS — I779 Disorder of arteries and arterioles, unspecified: Secondary | ICD-10-CM

## 2012-04-25 DIAGNOSIS — D45 Polycythemia vera: Secondary | ICD-10-CM

## 2012-04-25 DIAGNOSIS — I739 Peripheral vascular disease, unspecified: Secondary | ICD-10-CM | POA: Insufficient documentation

## 2012-04-25 DIAGNOSIS — E785 Hyperlipidemia, unspecified: Secondary | ICD-10-CM

## 2012-04-25 DIAGNOSIS — I10 Essential (primary) hypertension: Secondary | ICD-10-CM

## 2012-04-25 DIAGNOSIS — Z1211 Encounter for screening for malignant neoplasm of colon: Secondary | ICD-10-CM

## 2012-04-25 DIAGNOSIS — D751 Secondary polycythemia: Secondary | ICD-10-CM | POA: Insufficient documentation

## 2012-04-25 DIAGNOSIS — Z0001 Encounter for general adult medical examination with abnormal findings: Secondary | ICD-10-CM | POA: Insufficient documentation

## 2012-04-25 DIAGNOSIS — Z0000 Encounter for general adult medical examination without abnormal findings: Secondary | ICD-10-CM | POA: Insufficient documentation

## 2012-04-25 DIAGNOSIS — J449 Chronic obstructive pulmonary disease, unspecified: Secondary | ICD-10-CM

## 2012-04-25 DIAGNOSIS — F17200 Nicotine dependence, unspecified, uncomplicated: Secondary | ICD-10-CM

## 2012-04-25 MED ORDER — ZOSTER VACCINE LIVE 19400 UNT/0.65ML ~~LOC~~ SOLR: 0.6500 mL | Freq: Once | SUBCUTANEOUS | Status: AC

## 2012-04-25 MED ORDER — FLUTICASONE PROPIONATE 50 MCG/ACT NA SUSP: 2.0000 | Freq: Every day | NASAL | Status: DC

## 2012-04-25 NOTE — Assessment & Plan Note (Signed)
Good control on current regimen - continue. Started HCTZ beginning of the month, tolerating well.

## 2012-04-25 NOTE — Progress Notes (Signed)
Subjective:    Patient ID: Parker Martinez, male    DOB: March 17, 1941, 71 y.o.   MRN: [id number]  HPI CC: annual physical  HTN - started on HCTZ 12.5mg .  No HA, vision changes, CP/tightness, SOB, leg swelling.   Smoking 1 ppd, working on cutting back.  Bought cheap cigarettes he doesn't like so doesn't smoke as much.  Allergic rhinitis - Worsening cough and drainage over last few weeks.  Compliant with allegra, singulair regularly.  Not on INS.  Using albuterol more regularly.  Not using nasal saline.  PAD - Followed by [facility].  LLE arterial blockage 40-45%.  F/u appt with them in June 2013.  Prostate cancer - f/u with Dr. Earlene Plater June 2013.  May rpt biopsy.  Preventative  Pneumonia 2011 Shingles shot - would like sent to [facility]. Prostate - currently with cancer, active surveillance (Dr. Earlene Plater at [facility]).  PSA this week was 2.46. Colon screen - virtual colonoscopy 2007 WNL Leone Payor).  rec rpt 5 yrs.  Requests stool kit this year.  Vision screen - has appt 5/24 with ophtho. Hearing - wants to see Dr. Jearld Fenton ENT, due for this as well.  Trouble with pressure changes but never told has ETD.  1 fall in last year - dog pulled him down when chasing squirrel.  Injured right shoulder.  No other falls. No depression, anhedonia.  Medications and allergies reviewed and updated in chart.  Past histories reviewed and updated if relevant as below. Patient Active Problem List  Diagnoses  . ESOPHAGEAL STRICTURE  . Active smoker  . COPD (chronic obstructive pulmonary disease)  . Prostate cancer  . GERD (gastroesophageal reflux disease)  . Idiopathic anaphylaxis   Past Medical History  Diagnosis Date  . Allergic rhinitis   . Status post dilatation of esophageal stricture   . Duodenitis   . COPD (chronic obstructive pulmonary disease)   . Prostate cancer     active surveillance (Dr. Earlene Plater)  . GERD (gastroesophageal reflux disease)   . History of kidney stones remote  . PAD (peripheral artery  disease)   . Idiopathic anaphylaxis     unknown trigger, s/p eval at [facility]  . Arthritis     lumbar and cervical spine  . Smoker   . Diverticulosis 2008    by CT  . Hemangioma     congenital left arm  . DDD (degenerative disc disease)     lumbar (HNP R L3/4) s/p ESI, cervical  . HTN (hypertension)     borderline readings at home   Past Surgical History  Procedure Date  . Upper gastrointestinal endoscopy 06/08/2009    dilated esophageal stricture, duodenitis  . Shoulder surgery 1961    due to chronic dislocation  . Tonsillectomy 1944 ?  Marland Kitchen Virtual colonoscopy 2007    [name], rpt rec 5 yrs   History  Substance Use Topics  . Smoking status: Current Everyday Smoker -- 1.0 packs/day    Types: Cigarettes  . Smokeless tobacco: Never Used   Comment: would like to quit not ready to.  . Alcohol Use: 7.2 oz/week    12 Cans of beer per week     socially   Family History  Problem Relation Age of Onset  . Lung cancer Father   . Coronary artery disease Neg Hx   . Stroke Neg Hx   . Diabetes Neg Hx   . Arthritis Mother    No Known Allergies Current Outpatient Prescriptions on File Prior to Visit  Medication Sig Dispense  Refill  . albuterol (PROVENTIL HFA;VENTOLIN HFA) 108 (90 BASE) MCG/ACT inhaler Inhale 2 puffs into the lungs every 6 (six) hours as needed for wheezing.  1 Inhaler  11  . aspirin 81 MG tablet Take 81 mg by mouth daily.        Marland Kitchen b complex vitamins tablet Take 1 tablet by mouth daily.      . cholecalciferol (VITAMIN D) 1000 UNITS tablet Take 1,000 Units by mouth daily.      . cyclobenzaprine (FLEXERIL) 10 MG tablet Take 10 mg by mouth 3 (three) times daily as needed.      . fexofenadine (ALLEGRA) 180 MG tablet Take 180 mg by mouth daily.        . hydrochlorothiazide (MICROZIDE) 12.5 MG capsule Take 1 capsule (12.5 mg total) by mouth daily.  30 capsule  3  . HYDROcodone-acetaminophen (NORCO) 5-325 MG per tablet Take 1 tablet by mouth every 6 (six) hours as needed.  60  tablet  0  . montelukast (SINGULAIR) 10 MG tablet Take 1 tablet (10 mg total) by mouth at bedtime.  90 tablet  3  . Multiple Vitamin (MULTIVITAMIN) tablet Take 1 tablet by mouth daily.      . pantoprazole (PROTONIX) 40 MG tablet Take 1 tablet by mouth each day 30 minutes before a meal  30 tablet  11  . ranitidine (ZANTAC) 150 MG capsule Take 150 mg by mouth 2 (two) times daily.      . fluticasone (FLONASE) 50 MCG/ACT nasal spray Place 2 sprays into the nose daily.  16 g  3     Review of Systems  Constitutional: Negative for fever, chills, activity change, appetite change, fatigue and unexpected weight change.  HENT: Negative for hearing loss and neck pain.   Eyes: Negative for visual disturbance.  Respiratory: Positive for cough (h/o COPD) and shortness of breath. Negative for chest tightness and wheezing.   Cardiovascular: Negative for chest pain, palpitations and leg swelling.  Gastrointestinal: Negative for nausea, vomiting, abdominal pain, diarrhea, constipation, blood in stool and abdominal distention.  Genitourinary: Negative for hematuria and difficulty urinating.  Musculoskeletal: Negative for myalgias and arthralgias.  Skin: Negative for rash.  Neurological: Negative for dizziness, seizures, syncope and headaches.  Hematological: Does not bruise/bleed easily.  Psychiatric/Behavioral: Negative for dysphoric mood. The patient is not nervous/anxious.        Objective:   Physical Exam  Nursing note and vitals reviewed. Constitutional: He is oriented to person, place, and time. He appears well-developed and well-nourished. No distress.  HENT:  Head: Normocephalic and atraumatic.  Right Ear: External ear normal.  Left Ear: External ear normal.  Nose: Nose normal.  Mouth/Throat: Oropharynx is clear and moist. No oropharyngeal exudate.  Eyes: Conjunctivae and EOM are normal. Pupils are equal, round, and reactive to light. No scleral icterus.  Neck: Normal range of motion. Neck  supple. Carotid bruit is not present. No thyromegaly present.  Cardiovascular: Normal rate, regular rhythm, normal heart sounds and intact distal pulses.   No murmur heard. Pulses:      Radial pulses are 2+ on the right side, and 2+ on the left side.       Diminished BLE periph pulses  Pulmonary/Chest: Effort normal and breath sounds normal. No respiratory distress. He has no wheezes. He has no rales.       Coarse breath sounds  Abdominal: Soft. Bowel sounds are normal. He exhibits no distension and no mass. There is no tenderness. There is no rebound  and no guarding.  Genitourinary:       deferred  Musculoskeletal: Normal range of motion. He exhibits no edema.  Lymphadenopathy:    He has no cervical adenopathy.  Neurological: He is alert and oriented to person, place, and time.       CN grossly intact, station and gait intact  Skin: Skin is warm and dry. No rash noted.       Congenital left hand/forearm hemangioma  Psychiatric: He has a normal mood and affect. His behavior is normal. Judgment and thought content normal.       Assessment & Plan:

## 2012-04-25 NOTE — Assessment & Plan Note (Signed)
Discussed goal LDL likely <100 given h/o PAD. Discussed low chol diet and provided with handout. rtc 6 mo for recheck and discussion of statin if remaining elevated.

## 2012-04-25 NOTE — Assessment & Plan Note (Signed)
Anticipate secondary due to smoking.  Will review old records. Return in 6 mo for repeat. Encourage smoking cessation. Seems to be coming down since has been cutting back on smoking.

## 2012-04-25 NOTE — Patient Instructions (Addendum)
We will send Dr. Earlene Plater (urology) a copy of PSA. Low cholesterol diet handout provided today. Good to see you today, return in 6 months for fasting blood work (cholesterol and red cells), afterwards for visit. Continue working on quitting smoking. Keep eye on blood pressure at home and if consistently >140/90 then let me know. Stool kit today. zostavax sent to pharmacy.

## 2012-04-25 NOTE — Assessment & Plan Note (Signed)
Only on albuterol.  Cough/dyspnea at baseline. Will review records from [place] for recent PFTs

## 2012-04-25 NOTE — Assessment & Plan Note (Signed)
Anticipate goal LDL <100.  Give 6 mo for TLC, if staying above goal, will rec start statin.

## 2012-04-25 NOTE — Assessment & Plan Note (Signed)
Preventative protocols reviewed and updated unless pt declined. Virtual colonoscopy 2007, due for rpt but requests to start with iFOB. Prostate cancer followed by Dr. Earlene Plater - will fax today's PSA.  Deferred DRE. UTD on pneumonia shot.  Unsure about tetanus - if not found on records will rpt next visit.  Requests zostavax be sent to [facility] today. Discussed healthy diet and lifestyle.

## 2012-04-25 NOTE — Assessment & Plan Note (Signed)
Encouraged smoking cessation - pt states slowly backing down.

## 2012-04-29 ENCOUNTER — Encounter: Payer: Self-pay | Admitting: Family Medicine

## 2012-05-07 ENCOUNTER — Encounter: Payer: Self-pay | Admitting: Family Medicine

## 2012-05-21 ENCOUNTER — Encounter: Payer: Self-pay | Admitting: Neurosurgery

## 2012-05-22 ENCOUNTER — Ambulatory Visit (INDEPENDENT_AMBULATORY_CARE_PROVIDER_SITE_OTHER): Payer: PRIVATE HEALTH INSURANCE | Admitting: Neurosurgery

## 2012-05-22 ENCOUNTER — Encounter: Payer: Self-pay | Admitting: Neurosurgery

## 2012-05-22 ENCOUNTER — Encounter (INDEPENDENT_AMBULATORY_CARE_PROVIDER_SITE_OTHER): Payer: PRIVATE HEALTH INSURANCE

## 2012-05-22 VITALS — BP 145/78 | HR 102 | Resp 16 | Ht 69.0 in | Wt 164.2 lb

## 2012-05-22 DIAGNOSIS — I70219 Atherosclerosis of native arteries of extremities with intermittent claudication, unspecified extremity: Secondary | ICD-10-CM

## 2012-05-22 DIAGNOSIS — I739 Peripheral vascular disease, unspecified: Secondary | ICD-10-CM

## 2012-05-22 NOTE — Progress Notes (Signed)
[facility] [place] PAD/PVD Office Note  CC: An ABI for evaluation of PVD Referring Physician: Edilia Bo  History of Present Illness: 71 year old male patient of Dr. Edilia Bo followed for a history of lower extremity peripheral vascular disease. The patient's had no intervention to this point and reports no increased claudication pain, no rest pain and has no open wounds or limb threatening ischemia. The patient reports no new medical problems or no recent surgeries. The patient states he does have a "burning" in his anterior left thigh but has multilevel lumbar degenerative disc disease as well as stenosis.  Past Medical History  Diagnosis Date  . Allergic rhinitis   . Status post dilatation of esophageal stricture   . Duodenitis   . COPD (chronic obstructive pulmonary disease)     severe, participated in [place] study  . Prostate cancer 2011    active surveillance (Dr. Earlene Plater)  . GERD (gastroesophageal reflux disease)   . History of kidney stones remote  . PAD (peripheral artery disease)   . Idiopathic anaphylaxis     h/o recurrent anaphylaxis, unknown trigger, s/p eval at [facility] [phone]), last episode 03/2011  . Arthritis     lumbar and cervical spine  . Smoker   . Diverticulosis 2008    by CT  . Hemangioma     congenital left arm  . DDD (degenerative disc disease)     lumbar (HNP R L3/4) s/p ESI, cervical  . HTN (hypertension)     borderline readings at home    ROS: [x]  Positive   [ ]  Denies    General: [ ]  Weight loss, [ ]  Fever, [ ]  chills Neurologic: [ ]  Dizziness, [ ]  Blackouts, [ ]  Seizure [ ]  Stroke, [ ]  "Mini stroke", [ ]  Slurred speech, [ ]  Temporary blindness; [ ]  weakness in arms or legs, [ ]  Hoarseness Cardiac: [ ]  Chest pain/pressure, [ ]  Shortness of breath at rest [ ]  Shortness of breath with exertion, [ ]  Atrial fibrillation or irregular heartbeat Vascular: [ ]  Pain in legs with walking, [ ]  Pain in legs at rest, [ ]  Pain  in legs at night,  [ ]  Non-healing ulcer, [ ]  Blood clot in vein/DVT,   Pulmonary: [ ]  Home oxygen, [ ]  Productive cough, [ ]  Coughing up blood, [ ]  Asthma,  [ ]  Wheezing Musculoskeletal:  [ ]  Arthritis, [ ]  Low back pain, [ ]  Joint pain Hematologic: [ ]  Easy Bruising, [ ]  Anemia; [ ]  Hepatitis Gastrointestinal: [ ]  Blood in stool, [ ]  Gastroesophageal Reflux/heartburn, [ ]  Trouble swallowing Urinary: [ ]  chronic Kidney disease, [ ]  on HD - [ ]  MWF or [ ]  TTHS, [ ]  Burning with urination, [ ]  Difficulty urinating Skin: [ ]  Rashes, [ ]  Wounds Psychological: [ ]  Anxiety, [ ]  Depression   Social History History  Substance Use Topics  . Smoking status: Current Everyday Smoker -- 1.0 packs/day    Types: Cigarettes  . Smokeless tobacco: Never Used   Comment: would like to quit not ready to.  . Alcohol Use: 7.2 oz/week    12 Cans of beer per week     socially    Family History Family History  Problem Relation Age of Onset  . Lung cancer Father   . Cancer Father   . Hyperlipidemia Father   . Coronary artery disease Neg Hx   . Stroke Neg Hx   . Diabetes Neg Hx   . Arthritis Mother  No Known Allergies  Current Outpatient Prescriptions  Medication Sig Dispense Refill  . albuterol (PROVENTIL HFA;VENTOLIN HFA) 108 (90 BASE) MCG/ACT inhaler Inhale 2 puffs into the lungs every 6 (six) hours as needed for wheezing.  1 Inhaler  11  . aspirin 81 MG tablet Take 81 mg by mouth daily.        Marland Kitchen b complex vitamins tablet Take 1 tablet by mouth daily.      . cholecalciferol (VITAMIN D) 1000 UNITS tablet Take 1,000 Units by mouth daily.      . cyclobenzaprine (FLEXERIL) 10 MG tablet Take 10 mg by mouth 3 (three) times daily as needed.      . fexofenadine (ALLEGRA) 180 MG tablet Take 180 mg by mouth daily.        . fluticasone (FLONASE) 50 MCG/ACT nasal spray Place 2 sprays into the nose daily.  16 g  3  . hydrochlorothiazide (MICROZIDE) 12.5 MG capsule Take 1 capsule (12.5 mg total) by mouth  daily.  30 capsule  3  . HYDROcodone-acetaminophen (NORCO) 5-325 MG per tablet Take 1 tablet by mouth every 6 (six) hours as needed.  60 tablet  0  . montelukast (SINGULAIR) 10 MG tablet Take 1 tablet (10 mg total) by mouth at bedtime.  90 tablet  3  . Multiple Vitamin (MULTIVITAMIN) tablet Take 1 tablet by mouth daily.      . pantoprazole (PROTONIX) 40 MG tablet Take 1 tablet by mouth each day 30 minutes before a meal  30 tablet  11  . ranitidine (ZANTAC) 150 MG capsule Take 150 mg by mouth 2 (two) times daily.        Physical Examination  Filed Vitals:   05/22/12 1530  BP: 145/78  Pulse: 102  Resp: 16    Body mass index is 24.25 kg/(m^2).  General:  WDWN in NAD Gait: Normal HEENT: WNL Eyes: Pupils equal Pulmonary: normal non-labored breathing , without Rales, rhonchi,  wheezing Cardiac: RRR, without  Murmurs, rubs or gallops; No carotid bruits Abdomen: soft, NT, no masses Skin: no rashes, ulcers noted Vascular Exam/Pulses: Patient has palpable femoral pulses PT and DP pulses are not palpable  Extremities without ischemic changes, no Gangrene , no cellulitis; no open wounds;  Musculoskeletal: no muscle wasting or atrophy  Neurologic: A&O X 3; Appropriate Affect ; SENSATION: normal; MOTOR FUNCTION:  moving all extremities equally. Speech is fluent/normal  Non-Invasive Vascular Imaging: ABIs today are 0.68 and monophasic on the right 0.69 and monophasic on the left. Previous ABIs were 0.71 on the right and 0.93 on the left as was reviewed with Dr. Edilia Bo who states the patient should followup in 6 months with repeat ABIs.  ASSESSMENT/PLAN: Assessment and plan as above, the patient will followup in 6 months with repeat studies. His questions were encouraged and answered.  Lauree Chandler ANP  Clinic M.D.: Edilia Bo

## 2012-05-23 NOTE — Addendum Note (Signed)
Addended by: Sharee Pimple on: 05/23/2012 08:21 AM   Modules accepted: Orders

## 2012-05-29 ENCOUNTER — Ambulatory Visit: Payer: Self-pay | Admitting: Neurosurgery

## 2012-06-03 ENCOUNTER — Encounter: Payer: Self-pay | Admitting: Internal Medicine

## 2012-06-03 ENCOUNTER — Other Ambulatory Visit: Payer: Self-pay

## 2012-06-03 DIAGNOSIS — I739 Peripheral vascular disease, unspecified: Secondary | ICD-10-CM

## 2012-06-10 ENCOUNTER — Ambulatory Visit (INDEPENDENT_AMBULATORY_CARE_PROVIDER_SITE_OTHER): Payer: PRIVATE HEALTH INSURANCE | Admitting: Family Medicine

## 2012-06-10 ENCOUNTER — Telehealth: Payer: Self-pay | Admitting: Family Medicine

## 2012-06-10 ENCOUNTER — Encounter: Payer: Self-pay | Admitting: Family Medicine

## 2012-06-10 VITALS — BP 140/60 | HR 84 | Temp 98.1°F | Wt 166.5 lb

## 2012-06-10 DIAGNOSIS — N419 Inflammatory disease of prostate, unspecified: Secondary | ICD-10-CM | POA: Insufficient documentation

## 2012-06-10 MED ORDER — CIPROFLOXACIN HCL 500 MG PO TABS: 500.0000 mg | ORAL_TABLET | Freq: Two times a day (BID) | ORAL | Status: DC

## 2012-06-10 MED ORDER — EPINEPHRINE 0.3 MG/0.3ML IJ DEVI: 0.3000 mg | Freq: Once | INTRAMUSCULAR | Status: DC

## 2012-06-10 NOTE — Telephone Encounter (Signed)
Caller: Rosco/Patient; PCP: Eustaquio Boyden; CB#: [phone]; ; ; Call regarding Prostatitis Symptoms; Patient reports urgency, frequency with small amounts of urine, reports "low grade fever"; Emergent s/s of "Urinary tract symptoms and any flank or low back, penis or scrotal pain" positive per Urinary Symptoms, Male guideline. Appt scheduled with Dr. Para March for 06/10/12 @ 4:00 pm. Patient has another appt at 2:45pm and would not be able to get to office for an earlier appt.

## 2012-06-10 NOTE — Telephone Encounter (Signed)
Will see pt today.  

## 2012-06-10 NOTE — Assessment & Plan Note (Signed)
Check ucx, start cipro given the exam and history.  Nontoxic.  Okay for outpatient f/u.  D/wpt.  He agrees.

## 2012-06-10 NOTE — Patient Instructions (Addendum)
We'll contact you with your lab report. Take the cipro for 3 weeks.  Take care.  This should get better gradually.

## 2012-06-10 NOTE — Progress Notes (Signed)
H/o anaphylaxis and needs refill on epipen.  No recent sx.    Back pain.  H/o prostatitis.  Lower back pain typical for prev prostatitis, not his typical MSK back pain.  Frequency, trouble emptying bladder, incomplete voiding.  No fevers but occ chills. Going on for about 3 weeks.    Meds, vitals, and allergies reviewed.   ROS: See HPI.  Otherwise, noncontributory.  nad ncat ctab rrr Back w/o CVA pain abd benign, not ttp Prostate ttp

## 2012-06-11 LAB — URINE CULTURE
Colony Count: NO GROWTH
Organism ID, Bacteria: NO GROWTH

## 2012-06-21 ENCOUNTER — Other Ambulatory Visit: Payer: Self-pay | Admitting: Family Medicine

## 2012-06-21 NOTE — Telephone Encounter (Signed)
Rx called in as directed.   

## 2012-06-21 NOTE — Telephone Encounter (Signed)
OK to refill

## 2012-06-21 NOTE — Telephone Encounter (Signed)
plz phone in. 

## 2012-08-07 ENCOUNTER — Other Ambulatory Visit: Payer: Self-pay | Admitting: Family Medicine

## 2012-08-19 ENCOUNTER — Other Ambulatory Visit: Payer: Self-pay | Admitting: *Deleted

## 2012-08-19 MED ORDER — CYCLOBENZAPRINE HCL 10 MG PO TABS: 10.0000 mg | ORAL_TABLET | Freq: Two times a day (BID) | ORAL | Status: DC | PRN

## 2012-08-19 NOTE — Telephone Encounter (Signed)
OK to refill

## 2012-09-16 ENCOUNTER — Other Ambulatory Visit: Payer: Self-pay

## 2012-09-16 MED ORDER — PANTOPRAZOLE SODIUM 40 MG PO TBEC: DELAYED_RELEASE_TABLET | ORAL | Status: DC

## 2012-10-14 ENCOUNTER — Other Ambulatory Visit: Payer: Self-pay | Admitting: Orthopedic Surgery

## 2012-10-14 ENCOUNTER — Ambulatory Visit
Admission: RE | Admit: 2012-10-14 | Discharge: 2012-10-14 | Disposition: A | Discharge status: Home / Self Care | Payer: PRIVATE HEALTH INSURANCE | Source: Ambulatory Visit | Attending: Orthopedic Surgery | Admitting: Orthopedic Surgery

## 2012-10-14 DIAGNOSIS — M25519 Pain in unspecified shoulder: Secondary | ICD-10-CM

## 2012-10-15 ENCOUNTER — Ambulatory Visit (INDEPENDENT_AMBULATORY_CARE_PROVIDER_SITE_OTHER): Payer: PRIVATE HEALTH INSURANCE | Admitting: Family Medicine

## 2012-10-15 ENCOUNTER — Telehealth: Payer: Self-pay

## 2012-10-15 ENCOUNTER — Encounter: Payer: Self-pay | Admitting: Family Medicine

## 2012-10-15 VITALS — BP 132/70 | HR 105 | Temp 98.4°F | Wt 158.0 lb

## 2012-10-15 DIAGNOSIS — N419 Inflammatory disease of prostate, unspecified: Secondary | ICD-10-CM

## 2012-10-15 MED ORDER — CIPROFLOXACIN HCL 500 MG PO TABS: 500.0000 mg | ORAL_TABLET | Freq: Two times a day (BID) | ORAL | Status: AC

## 2012-10-15 NOTE — Telephone Encounter (Signed)
Pt walked in;since 10/12/12 developing back pain all the way across lower back;left groin pain,frequency and trouble urinating, no fever, pt has prostate CA. Pt request Cipro refill. Dr Para March said needs to be seen. Pt to see Dr Para March today at 12:30.pt understood and will return and check in at front desk when comes back.

## 2012-10-15 NOTE — Patient Instructions (Addendum)
Start back on the cipro and call Dr. Earlene Plater about follow up.

## 2012-10-15 NOTE — Progress Notes (Signed)
Possible recurrent prostatitis.  Worse over the last few days.  He had some left over vicodin and that helped with the pain.  Lower back pain noted.  Urinary frequency at baseline.  No burning with urination.  No blood in urine.  He has more urinary urgency recently.  No fevers.  Occ chills.  No other known trigger for back pain.    He has had f/u with Dr. Teressa Senter re: R shoulder pain.    Meds, vitals, and allergies reviewed.   ROS: See HPI.  Otherwise, noncontributory.  nad ncat Back w/o rash No focal tenderness on back on palpation DRE with tender prostate No other masses noted

## 2012-10-16 NOTE — Assessment & Plan Note (Signed)
C/w exam.  Restart cipro and I'd like him to see uro about this given the recurrence of sx.  He agrees and he'll call about setting that up.  I sent 3 weeks of cipro, we can extend if needed.  He had voided just before the OV.  Nontoxic.

## 2012-10-28 ENCOUNTER — Ambulatory Visit: Payer: PRIVATE HEALTH INSURANCE | Admitting: Family Medicine

## 2012-12-10 ENCOUNTER — Encounter: Payer: Self-pay | Admitting: Neurosurgery

## 2012-12-11 ENCOUNTER — Ambulatory Visit: Payer: PRIVATE HEALTH INSURANCE | Admitting: Neurosurgery

## 2013-01-13 ENCOUNTER — Other Ambulatory Visit: Payer: Self-pay | Admitting: Family Medicine

## 2013-01-13 NOTE — Telephone Encounter (Signed)
OK to refill

## 2013-01-13 NOTE — Telephone Encounter (Signed)
plz phone in. 

## 2013-01-13 NOTE — Telephone Encounter (Signed)
Rx called in as directed.   

## 2013-03-04 ENCOUNTER — Encounter: Payer: Self-pay | Admitting: Family Medicine

## 2013-03-04 ENCOUNTER — Ambulatory Visit (INDEPENDENT_AMBULATORY_CARE_PROVIDER_SITE_OTHER): Payer: PRIVATE HEALTH INSURANCE | Admitting: Family Medicine

## 2013-03-04 VITALS — BP 110/68 | HR 81 | Temp 98.5°F | Wt 155.2 lb

## 2013-03-04 DIAGNOSIS — F17200 Nicotine dependence, unspecified, uncomplicated: Secondary | ICD-10-CM

## 2013-03-04 DIAGNOSIS — J441 Chronic obstructive pulmonary disease with (acute) exacerbation: Secondary | ICD-10-CM | POA: Insufficient documentation

## 2013-03-04 DIAGNOSIS — R634 Abnormal weight loss: Secondary | ICD-10-CM

## 2013-03-04 MED ORDER — ALBUTEROL SULFATE HFA 108 (90 BASE) MCG/ACT IN AERS: 2.0000 | INHALATION_SPRAY | Freq: Four times a day (QID) | RESPIRATORY_TRACT | Status: DC | PRN

## 2013-03-04 MED ORDER — DOXYCYCLINE HYCLATE 100 MG PO CAPS: 100.0000 mg | ORAL_CAPSULE | Freq: Two times a day (BID) | ORAL | Status: DC

## 2013-03-04 MED ORDER — MOMETASONE FURO-FORMOTEROL FUM 200-5 MCG/ACT IN AERO: 1.0000 | INHALATION_SPRAY | Freq: Two times a day (BID) | RESPIRATORY_TRACT | Status: DC

## 2013-03-04 MED ORDER — PREDNISONE 20 MG PO TABS: ORAL_TABLET | ORAL | Status: DC

## 2013-03-04 NOTE — Assessment & Plan Note (Signed)
Treat with doxy, prednisone course, and plain mucinex. See pt instructions for supportive care. If persistent or not improving as expected, update [place].

## 2013-03-04 NOTE — Patient Instructions (Addendum)
Double check with Dr. Leone Payor about repeating virtual colonoscopy. Schedule appointment with Dr. Earlene Plater if due. Check with insurance about cost of low resolution CT scan of lungs for lung cancer screening. I do think you have COPD exacerbation.  Treat with steroid course, antibiotic course, and plain mucinex. Use cough medicine at home for night time. Let us know if fever >101, worsening productive cough, or any other concerns or not improving as expected.

## 2013-03-04 NOTE — Assessment & Plan Note (Signed)
Encouraged f/u with Dr. Earlene Plater for prostate cancer surveillance Encouraged f/u with Dr. Leone Payor for rpt colon cancer screening as due. Encouraged LRCT - will call insurance to check on coverage of this with [employer]. If persistent weight loss, recommended return for further evaluation.

## 2013-03-04 NOTE — Assessment & Plan Note (Signed)
States quit 3 d ago. Encouraged to remain abstinent. 60+ PY hx.

## 2013-03-04 NOTE — Progress Notes (Signed)
  Subjective:    Patient ID: Parker Martinez, male    DOB: 11/09/1941, 72 y.o.   MRN: [id number]  HPI CC: sinus drainage  3 months ago, not feeling well, stopped all meds and started feeling better.  COPD - on proventil and dulera.  Ran out of dulera over weekend.  Needs both refilled.  H/o severe COPD, participated in [place] study.  Did have CT of lungs done, but 10-12 yrs ago.  Presents with worsening sinus drainage over last several weeks.  Increased cough recently, worse at night and early AM, as well as increased sputum production, significant increase in dyspnea.  No recent COPD flare. No fevers/chills, sore throat, headaches, ear or tooth pain.  Chronic tinnitus. hasn't tried any OTC remedies. No sick contacts at home. Smoker - 1 ppd.  Hasn't had a cigarette since Saturday afternoon.  60+ PY hx.  Holding aspirin 2/2 upcoming dental procedures.  Weight loss noted.  Not eating well, no appetite, some dental issues.  Not trying to lose weight. Wt Readings from Last 3 Encounters:  03/04/13 155 lb 3 oz (70.393 kg)  10/15/12 158 lb (71.668 kg)  06/10/12 166 lb 8 oz (75.524 kg)   Past Medical History  Diagnosis Date  . Allergic rhinitis   . Status post dilatation of esophageal stricture   . Duodenitis   . COPD (chronic obstructive pulmonary disease)     severe, participated in [place] study  . Prostate cancer 2011    active surveillance (Dr. Earlene Plater)  . GERD (gastroesophageal reflux disease)   . History of kidney stones remote  . PAD (peripheral artery disease)   . Idiopathic anaphylaxis     h/o recurrent anaphylaxis, unknown trigger, s/p eval at [facility] [phone]), last episode 03/2011  . Arthritis     lumbar and cervical spine  . Smoker   . Diverticulosis 2008    by CT  . Hemangioma     congenital left arm  . DDD (degenerative disc disease)     lumbar (HNP R L3/4) s/p ESI, cervical  . HTN (hypertension)     borderline readings at home    Review of  Systems Per HPI    Objective:   Physical Exam  Nursing note and vitals reviewed. Constitutional: He appears well-developed and well-nourished. No distress.  thin  HENT:  Head: Normocephalic and atraumatic.  Right Ear: Hearing, tympanic membrane, external ear and ear canal normal.  Left Ear: Hearing, tympanic membrane, external ear and ear canal normal.  Nose: Nose normal. No mucosal edema or rhinorrhea. Right sinus exhibits no maxillary sinus tenderness and no frontal sinus tenderness. Left sinus exhibits no maxillary sinus tenderness and no frontal sinus tenderness.  Mouth/Throat: Uvula is midline, oropharynx is clear and moist and mucous membranes are normal. No oropharyngeal exudate, posterior oropharyngeal edema, posterior oropharyngeal erythema or tonsillar abscesses.  Pale turbinates  Eyes: Conjunctivae and EOM are normal. Pupils are equal, round, and reactive to light. No scleral icterus.  Neck: Normal range of motion. Neck supple.  Cardiovascular: Normal rate, regular rhythm, normal heart sounds and intact distal pulses.   No murmur heard. Pulmonary/Chest: Effort normal and breath sounds normal. No respiratory distress. He has no wheezes. He has no rales.  Coarse especially at bases  Lymphadenopathy:    He has no cervical adenopathy.  Skin: Skin is warm and dry. No rash noted.       Assessment & Plan:

## 2013-04-16 ENCOUNTER — Ambulatory Visit (INDEPENDENT_AMBULATORY_CARE_PROVIDER_SITE_OTHER): Payer: PRIVATE HEALTH INSURANCE | Admitting: Family Medicine

## 2013-04-16 ENCOUNTER — Ambulatory Visit
Admission: RE | Admit: 2013-04-16 | Discharge: 2013-04-16 | Disposition: A | Discharge status: Home / Self Care | Payer: PRIVATE HEALTH INSURANCE | Source: Ambulatory Visit | Attending: Family Medicine | Admitting: Family Medicine

## 2013-04-16 ENCOUNTER — Encounter: Payer: Self-pay | Admitting: Family Medicine

## 2013-04-16 ENCOUNTER — Other Ambulatory Visit: Payer: Self-pay | Admitting: Family Medicine

## 2013-04-16 VITALS — BP 118/82 | HR 64 | Temp 98.6°F | Wt 150.8 lb

## 2013-04-16 DIAGNOSIS — J449 Chronic obstructive pulmonary disease, unspecified: Secondary | ICD-10-CM

## 2013-04-16 DIAGNOSIS — R109 Unspecified abdominal pain: Secondary | ICD-10-CM

## 2013-04-16 LAB — LIPASE: Lipase: 15 U/L (ref 11.0–59.0)

## 2013-04-16 LAB — POCT URINALYSIS DIPSTICK
Blood, UA: NEGATIVE
Glucose, UA: NEGATIVE
Ketones, UA: NEGATIVE
Leukocytes, UA: NEGATIVE
Nitrite, UA: NEGATIVE
Spec Grav, UA: >=1.03
Urobilinogen, UA: 0.2
pH, UA: 6

## 2013-04-16 LAB — COMPREHENSIVE METABOLIC PANEL
ALT: 19 U/L (ref 0–53)
AST: 17 U/L (ref 0–37)
Albumin: 3.6 g/dL (ref 3.5–5.2)
Alkaline Phosphatase: 96 U/L (ref 39–117)
BUN: 6 mg/dL (ref 6–23)
CO2: 28 mEq/L (ref 19–32)
Calcium: 9.3 mg/dL (ref 8.4–10.5)
Chloride: 103 mEq/L (ref 96–112)
Creatinine, Ser: 0.9 mg/dL (ref 0.4–1.5)
GFR: 89.09 mL/min (ref >60.00)
Glucose, Bld: 101 mg/dL — ABNORMAL HIGH (ref 70–99)
Potassium: 4.2 mEq/L (ref 3.5–5.1)
Sodium: 140 mEq/L (ref 135–145)
Total Bilirubin: 0.6 mg/dL (ref 0.3–1.2)
Total Protein: 6.7 g/dL (ref 6.0–8.3)

## 2013-04-16 LAB — CBC WITH DIFFERENTIAL/PLATELET
Basophils Absolute: 0 10*3/uL (ref 0.0–0.1)
Basophils Relative: 0.3 % (ref 0.0–3.0)
Eosinophils Absolute: 0.2 10*3/uL (ref 0.0–0.7)
Eosinophils Relative: 1.6 % (ref 0.0–5.0)
HCT: 49 % (ref 39.0–52.0)
Hemoglobin: 16.9 g/dL (ref 13.0–17.0)
Lymphocytes Relative: 22.6 % (ref 12.0–46.0)
Lymphs Abs: 2.4 10*3/uL (ref 0.7–4.0)
MCHC: 34.4 g/dL (ref 30.0–36.0)
MCV: 93 fl (ref 78.0–100.0)
Monocytes Absolute: 0.9 10*3/uL (ref 0.1–1.0)
Monocytes Relative: 8.5 % (ref 3.0–12.0)
Neutro Abs: 7 10*3/uL (ref 1.4–7.7)
Neutrophils Relative %: 67 % (ref 43.0–77.0)
Platelets: 274 10*3/uL (ref 150.0–400.0)
RBC: 5.28 Mil/uL (ref 4.22–5.81)
RDW: 12.4 % (ref 11.5–14.6)
WBC: 10.5 10*3/uL (ref 4.5–10.5)

## 2013-04-16 MED ORDER — IOHEXOL 300 MG/ML  SOLN
100.0000 mL | Freq: Once | INTRAMUSCULAR | Status: AC | PRN
  Administered 2013-04-16: 100 mL via INTRAVENOUS

## 2013-04-16 MED ORDER — TIOTROPIUM BROMIDE MONOHYDRATE 18 MCG IN CAPS: 18.0000 ug | ORAL_CAPSULE | Freq: Every day | RESPIRATORY_TRACT | Status: DC

## 2013-04-16 MED ORDER — HYDROCODONE-ACETAMINOPHEN 5-325 MG PO TABS: ORAL_TABLET | ORAL | Status: DC

## 2013-04-16 MED ORDER — MONTELUKAST SODIUM 10 MG PO TABS: 10.0000 mg | ORAL_TABLET | Freq: Every day | ORAL | Status: DC

## 2013-04-16 MED ORDER — AMOXICILLIN-POT CLAVULANATE 875-125 MG PO TABS: 1.0000 | ORAL_TABLET | Freq: Two times a day (BID) | ORAL | Status: DC

## 2013-04-16 NOTE — Assessment & Plan Note (Signed)
Continue to encourage smoking cessation. Endorses fair compliance with dulera.   Recent increased need of albuterol as well as increased cough and DOE.  However, clinically no evidence for COPD exacerbation.  I will start spiriva today, encouraged compliance with dulera rtc 1 mo for f/u COPD, consider spirometry

## 2013-04-16 NOTE — Patient Instructions (Signed)
I want to get blood work today as well as evaluate for diverticulitis and kidney stone, as well as liver and gallbladder. We will call you at [phone] (M) with results In interim, watch for fever or worsening abdominal pain - if that happens, let us know. For breathing, continue dulera and singulair.  Start spiriva - sent into pharmacy.

## 2013-04-16 NOTE — Progress Notes (Signed)
  Subjective:    Patient ID: Parker Martinez, male    DOB: 1941-04-15, 72 y.o.   MRN: [id number]  HPI CC: L flank pain  Holding aspirin for upcoming dental surgery.  Scheduled for 6/2/20214.  COPD - noticing increased use of albuterol in last week.  Increased DOE recently, increased cough recently worse at night.  Increased sputum production.   Occasional fevers/chills. Has been on dulera for last several years. Smoking - 4-5 cig/day. Had COPD exacerbation 03/2013.  H/o severe COPD, participated in [place] study.   L lateral abd pain that started 1d ago, radiates to lower abdomen.  Sharp stabbing pain intermittently.  No fevers from this, diarrhea or bowel changes, hematuria, dysuria, urgency.  + frequency, chronic.  Remote h/o kidney stones.  No h/o diverticulitis.  No flank pain.  No nausea/vomting.  Wt Readings from Last 3 Encounters:  04/16/13 150 lb 12 oz (68.38 kg)  03/04/13 155 lb 3 oz (70.393 kg)  10/15/12 158 lb (71.668 kg)  insurance won't cover LRCT for lung cancer surveillance.  Past Medical History  Diagnosis Date  . Allergic rhinitis   . Status post dilatation of esophageal stricture   . Duodenitis   . COPD (chronic obstructive pulmonary disease)     severe, participated in [place] study  . Prostate cancer 2011    active surveillance (Dr. Earlene Plater)  . GERD (gastroesophageal reflux disease)   . History of kidney stones remote  . PAD (peripheral artery disease)   . Idiopathic anaphylaxis     h/o recurrent anaphylaxis, unknown trigger, s/p eval at [facility] [phone]), last episode 03/2011  . Arthritis     lumbar and cervical spine  . Smoker   . Diverticulosis 2008    by CT  . Hemangioma     congenital left arm  . DDD (degenerative disc disease)     lumbar (HNP R L3/4) s/p ESI, cervical  . HTN (hypertension)     borderline readings at home     Review of Systems Per HPI    Objective:   Physical Exam  Nursing note and vitals  reviewed. Constitutional: He appears well-developed and well-nourished. No distress.  HENT:  Head: Normocephalic and atraumatic.  Mouth/Throat: Oropharynx is clear and moist. No oropharyngeal exudate.  Eyes: Conjunctivae and EOM are normal. Pupils are equal, round, and reactive to light. No scleral icterus.  Neck: Normal range of motion. Neck supple.  Cardiovascular: Normal rate, regular rhythm, normal heart sounds and intact distal pulses.   No murmur heard. Pulmonary/Chest: Effort normal. No respiratory distress. He has no wheezes. He has no rales.  Coarse breath sounds but overall clear Harsh rattly cough present  Abdominal: Soft. Normal appearance and bowel sounds are normal. He exhibits no distension and no mass. There is no hepatosplenomegaly. There is tenderness (moderate) in the right upper quadrant, epigastric area, periumbilical area, suprapubic area and left lower quadrant. There is guarding (at RUQ and some at LLQ) and positive Murphy's sign. There is no rigidity, no rebound and no CVA tenderness. No hernia.  Musculoskeletal: He exhibits no edema.  Skin: Skin is warm and dry. No rash noted.       Assessment & Plan:

## 2013-04-16 NOTE — Assessment & Plan Note (Signed)
Anticipate diverticulitis. Doubt nephrolithiasis as no hematuria today. Check blood work today (CMP, CBC, lipase). Given abdominal exam with guarding, I will obtain CT abd/pelvis with contrast to eval for diverticulitis and for liver/gallbladder pathology We will call him with results. Pt agrees with plan

## 2013-06-13 ENCOUNTER — Ambulatory Visit (INDEPENDENT_AMBULATORY_CARE_PROVIDER_SITE_OTHER): Payer: PRIVATE HEALTH INSURANCE | Admitting: Internal Medicine

## 2013-06-13 ENCOUNTER — Encounter: Payer: Self-pay | Admitting: Internal Medicine

## 2013-06-13 VITALS — BP 118/72 | HR 104 | Ht 67.5 in | Wt 155.4 lb

## 2013-06-13 DIAGNOSIS — K219 Gastro-esophageal reflux disease without esophagitis: Secondary | ICD-10-CM

## 2013-06-13 DIAGNOSIS — K222 Esophageal obstruction: Secondary | ICD-10-CM

## 2013-06-13 DIAGNOSIS — Z1211 Encounter for screening for malignant neoplasm of colon: Secondary | ICD-10-CM

## 2013-06-13 MED ORDER — PANTOPRAZOLE SODIUM 40 MG PO TBEC: 40.0000 mg | DELAYED_RELEASE_TABLET | Freq: Every day | ORAL | Status: DC

## 2013-06-13 NOTE — Patient Instructions (Addendum)
You have been given a separate informational sheet regarding your tobacco use, the importance of quitting and local resources to help you quit.  We have sent the following medications to your pharmacy for you to pick up at your convenience: Pantoprazole  Follow up with [place] in 2 months.  I appreciate the opportunity to care for you.

## 2013-06-13 NOTE — Progress Notes (Signed)
  Subjective:    Patient ID: Parker Martinez, male    DOB: [birth year], 72 y.o.   MRN: [id number]  HPI He has been having some mild solid food dysphagia. Was dilated to 16.5 mm 2012 and told to take PPI but he stopped pantoprazole at some point. He had a CT colonoscopy 02/2006 Medications, allergies, past medical history, past surgical history, family history and social history are reviewed and updated in the EMR.   Review of Systems As above    Objective:   Physical Exam Well-developed elderly white man in no acute distress       Assessment & Plan:   1. Esophageal stricture   2. GERD (gastroesophageal reflux disease)   3. Special screening for malignant neoplasms, colon      Restart PPI and regroup in 2 mos.  If he still having dysphagia then we'll plan on repeating an EGD with esophageal dilation. If not will do schedule a colonoscopy for screening purposes and continue the PPI.  CC: Eustaquio Boyden, MD

## 2013-06-14 ENCOUNTER — Other Ambulatory Visit: Payer: Self-pay | Admitting: Family Medicine

## 2013-06-15 NOTE — Telephone Encounter (Signed)
plz phone in. 

## 2013-06-16 NOTE — Telephone Encounter (Signed)
Rx called in as directed.   

## 2013-08-04 HISTORY — PX: COLONOSCOPY: SHX174

## 2013-08-14 ENCOUNTER — Ambulatory Visit (INDEPENDENT_AMBULATORY_CARE_PROVIDER_SITE_OTHER): Payer: PRIVATE HEALTH INSURANCE | Admitting: Internal Medicine

## 2013-08-14 ENCOUNTER — Encounter: Payer: Self-pay | Admitting: Internal Medicine

## 2013-08-14 VITALS — BP 120/84 | HR 80 | Ht 67.5 in | Wt 156.1 lb

## 2013-08-14 DIAGNOSIS — Z1211 Encounter for screening for malignant neoplasm of colon: Secondary | ICD-10-CM

## 2013-08-14 DIAGNOSIS — K222 Esophageal obstruction: Secondary | ICD-10-CM

## 2013-08-14 MED ORDER — NA SULFATE-K SULFATE-MG SULF 17.5-3.13-1.6 GM/177ML PO SOLN: 1.0000 | Freq: Once | ORAL | Status: DC

## 2013-08-14 NOTE — Progress Notes (Signed)
  Subjective:    Patient ID: Parker Martinez, male    DOB: Oct 07, 1941, 72 y.o.   MRN: [id number]  HPI The patient has a hx of esophageal stricture - had stopped PPI and began to have dysphagia again. No dysphagia back on PPI. Had diverticulitis since last visit. Tx w/ Augmentin Due for repeat screening colonoscopy. Medications, allergies, past medical history, past surgical history, family history and social history are reviewed and updated in the EMR.  Review of Systems As above    Objective:   Physical Exam WDWN NAD    Assessment & Plan:  ESOPHAGEAL STRICTURE  Special screening for malignant neoplasms, colon  See problem oriented charting  Will schedule screening colonoscopy The risks and benefits as well as alternatives of endoscopic procedure(s) have been discussed and reviewed. All questions answered. The patient agrees to proceed.  FA:[id number] Sharen Hones, MD

## 2013-08-14 NOTE — Assessment & Plan Note (Signed)
No dysphagia after back on PPI - so does not need EGD dilation. He understands need to stay on PPI.

## 2013-08-14 NOTE — Patient Instructions (Addendum)
You have been given a separate informational sheet regarding your tobacco use, the importance of quitting and local resources to help you quit. You have been scheduled for a colonoscopy with propofol. Please follow written instructions given to you at your visit today.  Please pick up your prep kit at the pharmacy within the next 1-3 days. If you use inhalers (even only as needed), please bring them with you on the day of your procedure. Your physician has requested that you go to www.startemmi.com and enter the access code given to you at your visit today. This web site gives a general overview about your procedure. However, you should still follow specific instructions given to you by our office regarding your preparation for the procedure. CC:  Javier Gutierrez MD  

## 2013-08-15 ENCOUNTER — Encounter: Payer: Self-pay | Admitting: Internal Medicine

## 2013-08-15 ENCOUNTER — Encounter: Payer: Self-pay | Admitting: Family Medicine

## 2013-08-25 ENCOUNTER — Other Ambulatory Visit: Payer: Self-pay | Admitting: Family Medicine

## 2013-08-25 NOTE — Telephone Encounter (Signed)
Ok to refill in Dr. Timoteo Expose absence? Last filled 06/14/13.

## 2013-08-25 NOTE — Telephone Encounter (Signed)
Please call in

## 2013-08-26 ENCOUNTER — Encounter: Payer: Self-pay | Admitting: Internal Medicine

## 2013-08-26 ENCOUNTER — Ambulatory Visit (AMBULATORY_SURGERY_CENTER): Payer: PRIVATE HEALTH INSURANCE | Admitting: Internal Medicine

## 2013-08-26 VITALS — BP 123/72 | HR 78 | Temp 96.8°F | Resp 18 | Ht 67.5 in | Wt 156.0 lb

## 2013-08-26 DIAGNOSIS — K648 Other hemorrhoids: Secondary | ICD-10-CM

## 2013-08-26 DIAGNOSIS — Z1211 Encounter for screening for malignant neoplasm of colon: Secondary | ICD-10-CM

## 2013-08-26 DIAGNOSIS — D126 Benign neoplasm of colon, unspecified: Secondary | ICD-10-CM

## 2013-08-26 DIAGNOSIS — K5730 Diverticulosis of large intestine without perforation or abscess without bleeding: Secondary | ICD-10-CM

## 2013-08-26 MED ORDER — SODIUM CHLORIDE 0.9 % IV SOLN: 500.0000 mL | INTRAVENOUS | Status: DC

## 2013-08-26 NOTE — Patient Instructions (Addendum)
I found and removed 4 polyps - they all look benign.  You also have diverticulosis and hemorrhoids - not typically problems but can be.  Please read the handouts.  If you have hemorrhoid problems (swelling, itching, bleeding) I am able to treat those with an in-office procedure. If you like, please call my office at [phone] to schedule an appointment and I can evaluate you further.   I will let you know pathology results and when to have another routine colonoscopy by mail.  I appreciate the opportunity to care for you. Iva Boop, MD, FACG  YOU HAD AN ENDOSCOPIC PROCEDURE TODAY AT THE [facility]: Refer to the procedure report that was given to you for any specific questions about what was found during the examination.  If the procedure report does not answer your questions, please call your gastroenterologist to clarify.  If you requested that your care partner not be given the details of your procedure findings, then the procedure report has been included in a sealed envelope for you to review at your convenience later.  YOU SHOULD EXPECT: Some feelings of bloating in the abdomen. Passage of more gas than usual.  Walking can help get rid of the air that was put into your GI tract during the procedure and reduce the bloating. If you had a lower endoscopy (such as a colonoscopy or flexible sigmoidoscopy) you may notice spotting of blood in your stool or on the toilet paper. If you underwent a bowel prep for your procedure, then you may not have a normal bowel movement for a few days.  DIET: Your first meal following the procedure should be a light meal and then it is ok to progress to your normal diet.  A half-sandwich or bowl of soup is an example of a good first meal.  Heavy or fried foods are harder to digest and may make you feel nauseous or bloated.  Likewise meals heavy in dairy and vegetables can cause extra gas to form and this can also increase the bloating.  Drink  plenty of fluids but you should avoid alcoholic beverages for 24 hours.  ACTIVITY: Your care partner should take you home directly after the procedure.  You should plan to take it easy, moving slowly for the rest of the day.  You can resume normal activity the day after the procedure however you should NOT DRIVE or use heavy machinery for 24 hours (because of the sedation medicines used during the test).    SYMPTOMS TO REPORT IMMEDIATELY: A gastroenterologist can be reached at any hour.  During normal business hours, 8:30 AM to 5:00 PM Monday through Friday, call [phone].  After hours and on weekends, please call the GI answering service at [phone] who will take a message and have the physician on call contact you.   Following lower endoscopy (colonoscopy or flexible sigmoidoscopy):  Excessive amounts of blood in the stool  Significant tenderness or worsening of abdominal pains  Swelling of the abdomen that is new, acute  Fever of 100F or higher  FOLLOW UP: If any biopsies were taken you will be contacted by phone or by letter within the next 1-3 weeks.  Call your gastroenterologist if you have not heard about the biopsies in 3 weeks.  Our staff will call the home number listed on your records the next business day following your procedure to check on you and address any questions or concerns that you may have at that time regarding  the information given to you following your procedure. This is a courtesy call and so if there is no answer at the home number and we have not heard from you through the emergency physician on call, we will assume that you have returned to your regular daily activities without incident.  SIGNATURES/CONFIDENTIALITY: You and/or your care partner have signed paperwork which will be entered into your electronic medical record.  These signatures attest to the fact that that the information above on your After Visit Summary has been reviewed and is understood.   Full responsibility of the confidentiality of this discharge information lies with you and/or your care-partner.

## 2013-08-26 NOTE — Progress Notes (Signed)
Pt voided 250 cc in urinal.

## 2013-08-26 NOTE — Progress Notes (Signed)
Lidocaine-40mg IV prior to Propofol InductionPropofol given over incremental dosages 

## 2013-08-26 NOTE — Progress Notes (Signed)
Called to room to assist during endoscopic procedure.  Patient ID and intended procedure confirmed with present staff. Received instructions for my participation in the procedure from the performing physician.  

## 2013-08-26 NOTE — Progress Notes (Signed)
Patient did not experience any of the following events: a burn prior to discharge; a fall within the facility; wrong site/side/patient/procedure/implant event; or a hospital transfer or hospital admission upon discharge from the facility. (G8907) Patient did not have preoperative order for IV antibiotic SSI prophylaxis. (G8918)  

## 2013-08-26 NOTE — Telephone Encounter (Signed)
Rx called in as directed.   

## 2013-08-26 NOTE — Op Note (Signed)
[facility] [address] [place], [address]   COLONOSCOPY PROCEDURE REPORT  PATIENT: Parker Martinez, Parker Martinez  MR#: [id number] BIRTHDATE: 1941/06/23 , 72  yrs. old GENDER: Male ENDOSCOPIST: Iva Boop, MD, [facility] PROCEDURE DATE:  08/26/2013 PROCEDURE:   Colonoscopy with biopsy and snare polypectomy First Screening Colonoscopy - Avg.  risk and is 50 yrs.  old or older Yes.  Prior Negative Screening - Now for repeat screening. N/A  History of Adenoma - Now for follow-up colonoscopy & has been > or = to 3 yrs.  N/A  Polyps Removed Today? Yes. ASA CLASS:   Class III INDICATIONS:average risk screening and first colonoscopy. MEDICATIONS: propofol (Diprivan) 500mg  IV, MAC sedation, administered by CRNA, and These medications were titrated to patient response per physician's verbal order  DESCRIPTION OF PROCEDURE:   After the risks benefits and alternatives of the procedure were thoroughly explained, informed consent was obtained.  A digital rectal exam revealed increased firmness of the prostate.   The LB [id number]  endoscope was introduced through the anus and advanced to the cecum, which was identified by both the appendix and ileocecal valve. No adverse events experienced.   The quality of the prep was Suprep adequate The instrument was then slowly withdrawn as the colon was fully examined.   COLON FINDINGS: Four sessile polyps measuring 4-8 mm in size were found at the ileocecal valve, splenic flexure, and in the descending colon.  A polypectomy was performed with cold forceps (IC valve, descending)and with a cold snare (splenic flexure, descending).  The resection was complete and the polyp tissue was completely retrieved.   There was severe diverticulosis noted in the sigmoid colon with associated angulation.   The colon mucosa was otherwise normal.  Retroflexed views revealed internal hemorrhoids. The time to cecum=9 minutes 23 seconds.   Withdrawal time=18 minutes 48 seconds.  The scope was withdrawn and the procedure completed. COMPLICATIONS: There were no complications.  ENDOSCOPIC IMPRESSION: 1.   Four sessile polyps measuring 4-8 mm in size were found at the ileocecal valve, splenic flexure, and in the descending colon; polypectomy was performed with cold forceps and with a cold snare 2.   There was severe diverticulosis noted in the sigmoid colon 3.   The colon mucosa was otherwise normal  - adequate prep  RECOMMENDATIONS: Timing of repeat colonoscopy will be determined by pathology findings.  eSigned:  Iva Boop, MD, [facility] 08/26/2013 12:20 PM   cc: The Patient and Eustaquio Boyden MD

## 2013-08-27 ENCOUNTER — Telehealth: Payer: Self-pay | Admitting: *Deleted

## 2013-08-27 NOTE — Telephone Encounter (Signed)
  Follow up Call-  Call back number 08/26/2013  Post procedure Call Back phone  # [phone]  Permission to leave phone message Yes     Patient questions:  Do you have a fever, pain , or abdominal swelling? no Pain Score  0 *  Have you tolerated food without any problems? yes  Have you been able to return to your normal activities? yes  Do you have any questions about your discharge instructions: Diet   no Medications  no Follow up visit  no  Do you have questions or concerns about your Care? no  Actions: * If pain score is 4 or above: No action needed, pain <4.  Pt c/o abd cramping, thinks maybe some air still in there, advised pt to drink warm liquids ,walk around, and call us back if pt had an increase in pain.-adm

## 2013-09-02 ENCOUNTER — Encounter: Payer: Self-pay | Admitting: Internal Medicine

## 2013-09-02 DIAGNOSIS — Z860100 Personal history of colon polyps, unspecified: Secondary | ICD-10-CM

## 2013-09-02 HISTORY — DX: Personal history of colon polyps, unspecified: Z86.0100

## 2013-09-02 HISTORY — DX: Personal history of colonic polyps: Z86.010

## 2013-09-02 NOTE — Progress Notes (Signed)
Quick Note:  4 adenomas max 8 mm Repeat colon 08/2016 ______

## 2013-10-27 ENCOUNTER — Other Ambulatory Visit: Payer: Self-pay | Admitting: Family Medicine

## 2013-10-27 ENCOUNTER — Encounter: Payer: Self-pay | Admitting: Family Medicine

## 2013-10-27 NOTE — Telephone Encounter (Signed)
Ok to refill 

## 2013-10-27 NOTE — Telephone Encounter (Signed)
Printed and handed to [place].

## 2013-12-04 DIAGNOSIS — K5792 Diverticulitis of intestine, part unspecified, without perforation or abscess without bleeding: Secondary | ICD-10-CM

## 2013-12-04 HISTORY — DX: Diverticulitis of intestine, part unspecified, without perforation or abscess without bleeding: K57.92

## 2013-12-09 ENCOUNTER — Encounter: Payer: Self-pay | Admitting: Family Medicine

## 2013-12-18 ENCOUNTER — Encounter: Payer: Self-pay | Admitting: Family Medicine

## 2014-03-30 ENCOUNTER — Ambulatory Visit (INDEPENDENT_AMBULATORY_CARE_PROVIDER_SITE_OTHER): Payer: Medicare Other | Admitting: Internal Medicine

## 2014-03-30 ENCOUNTER — Encounter: Payer: Self-pay | Admitting: Internal Medicine

## 2014-03-30 VITALS — BP 130/80 | HR 102 | Temp 97.8°F | Wt 163.0 lb

## 2014-03-30 DIAGNOSIS — N410 Acute prostatitis: Secondary | ICD-10-CM

## 2014-03-30 MED ORDER — HYDROCODONE-ACETAMINOPHEN 5-325 MG PO TABS: 1.0000 | ORAL_TABLET | Freq: Four times a day (QID) | ORAL | Status: DC | PRN

## 2014-03-30 MED ORDER — CIPROFLOXACIN HCL 500 MG PO TABS: 500.0000 mg | ORAL_TABLET | Freq: Two times a day (BID) | ORAL | Status: DC

## 2014-03-30 NOTE — Assessment & Plan Note (Signed)
Last episode about 1.5 years ago Fairly typical presentation for him Will treat with cipro for 3 weeks Copy to Dr [name]

## 2014-03-30 NOTE — Progress Notes (Signed)
Pre visit review using our clinic review tool, if applicable. No additional management support is needed unless otherwise documented below in the visit note. 

## 2014-03-30 NOTE — Progress Notes (Signed)
Subjective:    Patient ID: Parker Martinez, male    DOB: 1941/06/02, 73 y.o.   MRN: [id number]  HPI Thinks he has the prostatitis again Has had this multiple times in past History of prostate cancer -- watch and wait status for past 2 years  Started 2 days ago-- suprapubic pain Worse with cough No dysuria but has vastly increased frequency and urgency Feels incomplete with his emptying  Appetite is off--no major change No vomiting. Occasional nausea with sinus drainage and cough  Current Outpatient Prescriptions on File Prior to Visit  Medication Sig Dispense Refill  . aspirin 81 MG tablet Take 81 mg by mouth daily.        . cyclobenzaprine (FLEXERIL) 10 MG tablet TAKE ONE TABLET BY MOUTH TWICE DAILY AS NEEDED FOR MUSCLE SPASMS  30 tablet  1  . EPINEPHrine (EPIPEN) 0.3 mg/0.3 mL DEVI Inject 0.3 mLs (0.3 mg total) into the muscle once.  2 Device  1  . HYDROcodone-acetaminophen (NORCO/VICODIN) 5-325 MG per tablet TAKE ONE TABLET BY MOUTH EVERY 6 HOURS AS NEEDED FOR PAIN  50 tablet  0  . mometasone-formoterol (DULERA) 200-5 MCG/ACT AERO Inhale 1 puff into the lungs 2 (two) times daily.  13 g  11  . montelukast (SINGULAIR) 10 MG tablet Take 1 tablet (10 mg total) by mouth at bedtime.  90 tablet  3  . pantoprazole (PROTONIX) 40 MG tablet Take 1 tablet (40 mg total) by mouth daily before breakfast. Take 1 tablet by mouth each day 30 minutes before a meal  30 tablet  11  . tiotropium (SPIRIVA HANDIHALER) 18 MCG inhalation capsule Place 1 capsule (18 mcg total) into inhaler and inhale daily.  30 capsule  12   No current facility-administered medications on file prior to visit.    No Known Allergies  Past Medical History  Diagnosis Date  . Allergic rhinitis   . Status post dilatation of esophageal stricture   . Duodenitis   . COPD (chronic obstructive pulmonary disease)     severe, participated in [place] study  . Prostate cancer 2011    active surveillance (Dr. [name])  . GERD  (gastroesophageal reflux disease)   . History of kidney stones remote  . PAD (peripheral artery disease)   . Idiopathic anaphylaxis     h/o recurrent anaphylaxis, unknown trigger, s/p eval at [facility] [phone]), last episode 03/2011  . Arthritis     lumbar and cervical spine  . Smoker   . Diverticulosis 2008    by CT  . Hemangioma     congenital left arm  . DDD (degenerative disc disease)     lumbar (HNP R L3/4) s/p ESI, cervical  . HTN (hypertension)     borderline readings at home  . Esophageal stricture 2012    s/p dilation  . Personal history of colonic adenomas 09/02/2013    Past Surgical History  Procedure Laterality Date  . Upper gastrointestinal endoscopy  06/08/2009    dilated esophageal stricture, duodenitis  . Shoulder surgery  1961    due to chronic dislocation  . Tonsillectomy  1944 ?  Marland Kitchen Virtual colonoscopy  2007    [name], rpt rec 5 yrs  . Spirometry  10/2010    severe obstruction, FEV1 41%, ratio 0.46  . Colonoscopy  08/2013    4 tubular adenomas, severe divertic, rpt 3 yrs [name])    Family History  Problem Relation Age of Onset  . Lung cancer Father   .  Cancer Father   . Hyperlipidemia Father   . Coronary artery disease Neg Hx   . Stroke Neg Hx   . Diabetes Neg Hx   . Arthritis Mother     History   Social History  . Marital Status: Married    Spouse Name: N/A    Number of Children: 1  . Years of Education: N/A   Occupational History  . retired    Social History Main Topics  . Smoking status: Current Every Day Smoker -- 0.50 packs/day for 60 years    Types: Cigarettes  . Smokeless tobacco: Never Used     Comment: would like to quit not ready to.  . Alcohol Use: 7.2 oz/week    12 Cans of beer per week     Comment: socially  . Drug Use: No  . Sexual Activity: Not on file   Other Topics Concern  . Not on file   Social History Narrative   Caffeine: 2-4 cups coffee   Lives with wife, 1 bulldog, grown child  [facility])   Was pharmacist.  Optometrist      Tested negative for hydrocodone and positive for alcohol on UDS 10/2013.  Recommend Q6 mo testing and will need to discuss EtOH use at next visit.   Review of Systems Has chronic back problems More gas---moving his bowels more recently    Objective:   Physical Exam  Constitutional: He appears well-developed and well-nourished. No distress.  Abdominal: Soft. He exhibits no distension. There is no rebound and no guarding.  Mild suprapubic tenderness  Genitourinary:  No scrotal swelling or tenderness No hernia Prostate mildly enlarged, normal median sulcus and no nodules. Moderate tenderness          Assessment & Plan:

## 2014-03-31 ENCOUNTER — Telehealth: Payer: Self-pay | Admitting: Family Medicine

## 2014-03-31 NOTE — Telephone Encounter (Signed)
Relevant patient education assigned to patient using Emmi. ° °

## 2014-04-30 ENCOUNTER — Other Ambulatory Visit: Payer: Self-pay | Admitting: Family Medicine

## 2014-05-06 ENCOUNTER — Other Ambulatory Visit: Payer: Self-pay | Admitting: Family Medicine

## 2014-05-13 ENCOUNTER — Other Ambulatory Visit: Payer: Self-pay | Admitting: Family Medicine

## 2014-06-08 ENCOUNTER — Other Ambulatory Visit: Payer: Self-pay | Admitting: Family Medicine

## 2014-06-10 ENCOUNTER — Ambulatory Visit (INDEPENDENT_AMBULATORY_CARE_PROVIDER_SITE_OTHER): Payer: Medicare Other | Admitting: Family Medicine

## 2014-06-10 ENCOUNTER — Encounter: Payer: Self-pay | Admitting: Family Medicine

## 2014-06-10 VITALS — BP 150/76 | HR 108 | Temp 98.7°F | Wt 166.5 lb

## 2014-06-10 DIAGNOSIS — M5136 Other intervertebral disc degeneration, lumbar region: Secondary | ICD-10-CM | POA: Insufficient documentation

## 2014-06-10 DIAGNOSIS — I10 Essential (primary) hypertension: Secondary | ICD-10-CM

## 2014-06-10 DIAGNOSIS — J449 Chronic obstructive pulmonary disease, unspecified: Secondary | ICD-10-CM

## 2014-06-10 DIAGNOSIS — M5137 Other intervertebral disc degeneration, lumbosacral region: Secondary | ICD-10-CM

## 2014-06-10 DIAGNOSIS — F17200 Nicotine dependence, unspecified, uncomplicated: Secondary | ICD-10-CM

## 2014-06-10 MED ORDER — HYDROCODONE-ACETAMINOPHEN 5-325 MG PO TABS: 1.0000 | ORAL_TABLET | Freq: Four times a day (QID) | ORAL | Status: DC | PRN

## 2014-06-10 MED ORDER — TIOTROPIUM BROMIDE MONOHYDRATE 18 MCG IN CAPS: ORAL_CAPSULE | RESPIRATORY_TRACT | Status: DC

## 2014-06-10 MED ORDER — MOMETASONE FURO-FORMOTEROL FUM 200-5 MCG/ACT IN AERO: INHALATION_SPRAY | RESPIRATORY_TRACT | Status: DC

## 2014-06-10 MED ORDER — MONTELUKAST SODIUM 10 MG PO TABS: ORAL_TABLET | ORAL | Status: DC

## 2014-06-10 NOTE — Progress Notes (Signed)
BP 150/76  Pulse 108  Temp(Src) 98.7 F (37.1 C) (Oral)  Wt 166 lb 8 oz (75.524 kg)   CC: 1 mo f/u , med refill  Subjective:    Patient ID: Parker Martinez, male    DOB: Aug 07, 1941, 73 y.o.   MRN: [id number]  HPI: DIN BOOKWALTER is a 73 y.o. male presenting on 06/10/2014 for Medication Refill   H/o severe COPD, participated in [place] study.  Never on oxygen. Takes dulera twice daily, spiriva once daily and singulair daily. Smoking - 1/2 ppd. Has bought vape. SPIROMETRY Date: 10/2010 severe obstruction, FEV1 41%, ratio 0.46 Denies recent fevers/chills. Chronic productive cough. Knows to avoid humid heat, stays in [facility]. Discussed LRCT lung in past - had declined 2/2 financial concerns.  Intermittent hydrocodone use - once a week to 2-3 per day. Last dose was last week.  Recent diverticulitis by CT scan treated with augmentin course.  EtOH - 1-2 beers in afternoons.  bp elevated today - has never been on bp meds in past.  BP Readings from Last 3 Encounters:  06/10/14 150/76  03/30/14 130/80  08/26/13 123/72    Relevant past medical, surgical, family and social history reviewed and updated as indicated.  Allergies and medications reviewed and updated. Current Outpatient Prescriptions on File Prior to Visit  Medication Sig  . albuterol (PROVENTIL HFA;VENTOLIN HFA) 108 (90 BASE) MCG/ACT inhaler Inhale 2 puffs into the lungs every 6 (six) hours as needed for wheezing.  Marland Kitchen aspirin 81 MG tablet Take 81 mg by mouth daily.    . cyclobenzaprine (FLEXERIL) 10 MG tablet TAKE ONE TABLET BY MOUTH TWICE DAILY AS NEEDED FOR MUSCLE SPASMS  . EPINEPHrine (EPIPEN) 0.3 mg/0.3 mL DEVI Inject 0.3 mLs (0.3 mg total) into the muscle once.  . pantoprazole (PROTONIX) 40 MG tablet Take 1 tablet (40 mg total) by mouth daily before breakfast. Take 1 tablet by mouth each day 30 minutes before a meal   No current facility-administered medications on file prior to visit.    Review of Systems Per HPI  unless specifically indicated above    Objective:    BP 150/76  Pulse 108  Temp(Src) 98.7 F (37.1 C) (Oral)  Wt 166 lb 8 oz (75.524 kg)  Physical Exam  Nursing note and vitals reviewed. Constitutional: He appears well-developed and well-nourished. No distress.  Cardiovascular: Normal rate, regular rhythm, normal heart sounds and intact distal pulses.   No murmur heard. Pulmonary/Chest: Effort normal. No respiratory distress. He has decreased breath sounds. He has wheezes (faint exp diffuse). He has no rhonchi. He has no rales.  Coarse throughout Productive cough present  Musculoskeletal: He exhibits no edema.  Skin: Skin is warm and dry. No rash noted.       Assessment & Plan:   Problem List Items Addressed This Visit   HTN (hypertension)     Borderline elevated today. Continue to monitor. Off HCTZ.    DDD (degenerative disc disease)     Refilled hydrocodone.    Relevant Medications      HYDROcodone-acetaminophen (NORCO/VICODIN) 5-325 MG per tablet   Current smoker - Primary     Continue to encourage cessation. Has started vapes. 60+ PY hx.    COPD (chronic obstructive pulmonary disease)     Compliant with triple therapy. Continue to encourage smoking cessation. States has not recently needed albuterol. Will be due for spirometry in near future.    Relevant Medications      montelukast (SINGULAIR) 10 MG tablet  tiotropium (SPIRIVA HANDIHALER) 18 MCG inhalation capsule      mometasone-formoterol (DULERA) 200-5 MCG/ACT AERO       Follow up plan: Return in about 3 months (around 09/10/2014), or as needed, for annual exam, prior fasting for blood work.

## 2014-06-10 NOTE — Patient Instructions (Addendum)
Return for fasting blood work and afterwards for [employer] visit i've refilled medications in the interim. Good to see you today, call us with questions.

## 2014-06-10 NOTE — Assessment & Plan Note (Signed)
Refilled hydrocodone 

## 2014-06-10 NOTE — Progress Notes (Signed)
Pre visit review using our clinic review tool, if applicable. No additional management support is needed unless otherwise documented below in the visit note. 

## 2014-06-10 NOTE — Assessment & Plan Note (Signed)
Continue to encourage cessation. Has started vapes. 60+ PY hx.

## 2014-06-10 NOTE — Assessment & Plan Note (Signed)
Borderline elevated today. Continue to monitor. Off HCTZ.

## 2014-06-10 NOTE — Assessment & Plan Note (Signed)
Compliant with triple therapy. Continue to encourage smoking cessation. States has not recently needed albuterol. Will be due for spirometry in near future.

## 2014-06-11 ENCOUNTER — Telehealth: Payer: Self-pay | Admitting: Family Medicine

## 2014-06-11 NOTE — Telephone Encounter (Signed)
Relevant patient education assigned to patient using Emmi. ° °

## 2014-07-20 ENCOUNTER — Other Ambulatory Visit: Payer: Self-pay

## 2014-07-20 MED ORDER — PANTOPRAZOLE SODIUM 40 MG PO TBEC
40.0000 mg | DELAYED_RELEASE_TABLET | Freq: Every day | ORAL | Status: DC
Start: 2014-07-20 — End: 2015-01-27

## 2014-09-01 ENCOUNTER — Other Ambulatory Visit: Payer: Self-pay | Admitting: Family Medicine

## 2014-09-01 DIAGNOSIS — C61 Malignant neoplasm of prostate: Secondary | ICD-10-CM

## 2014-09-01 DIAGNOSIS — E559 Vitamin D deficiency, unspecified: Secondary | ICD-10-CM

## 2014-09-01 DIAGNOSIS — E538 Deficiency of other specified B group vitamins: Secondary | ICD-10-CM

## 2014-09-01 DIAGNOSIS — D751 Secondary polycythemia: Secondary | ICD-10-CM

## 2014-09-01 DIAGNOSIS — E785 Hyperlipidemia, unspecified: Secondary | ICD-10-CM

## 2014-09-01 DIAGNOSIS — I10 Essential (primary) hypertension: Secondary | ICD-10-CM

## 2014-09-04 ENCOUNTER — Other Ambulatory Visit: Payer: Self-pay | Admitting: Family Medicine

## 2014-09-04 ENCOUNTER — Other Ambulatory Visit: Payer: Medicare Other

## 2014-09-08 ENCOUNTER — Other Ambulatory Visit (INDEPENDENT_AMBULATORY_CARE_PROVIDER_SITE_OTHER): Payer: Medicare Other

## 2014-09-08 DIAGNOSIS — E559 Vitamin D deficiency, unspecified: Secondary | ICD-10-CM

## 2014-09-08 DIAGNOSIS — C61 Malignant neoplasm of prostate: Secondary | ICD-10-CM

## 2014-09-08 DIAGNOSIS — I10 Essential (primary) hypertension: Secondary | ICD-10-CM

## 2014-09-08 DIAGNOSIS — E538 Deficiency of other specified B group vitamins: Secondary | ICD-10-CM

## 2014-09-08 DIAGNOSIS — E785 Hyperlipidemia, unspecified: Secondary | ICD-10-CM

## 2014-09-08 DIAGNOSIS — D751 Secondary polycythemia: Secondary | ICD-10-CM

## 2014-09-08 LAB — CBC WITH DIFFERENTIAL/PLATELET
Basophils Absolute: 0 10*3/uL (ref 0.0–0.1)
Basophils Relative: 0.2 % (ref 0.0–3.0)
Eosinophils Absolute: 0.2 10*3/uL (ref 0.0–0.7)
Eosinophils Relative: 2.3 % (ref 0.0–5.0)
HCT: 47.1 % (ref 39.0–52.0)
Hemoglobin: 15.9 g/dL (ref 13.0–17.0)
Lymphocytes Relative: 28.2 % (ref 12.0–46.0)
Lymphs Abs: 2.3 10*3/uL (ref 0.7–4.0)
MCHC: 33.7 g/dL (ref 30.0–36.0)
MCV: 96.4 fl (ref 78.0–100.0)
Monocytes Absolute: 0.9 10*3/uL (ref 0.1–1.0)
Monocytes Relative: 10.7 % (ref 3.0–12.0)
Neutro Abs: 4.9 10*3/uL (ref 1.4–7.7)
Neutrophils Relative %: 58.6 % (ref 43.0–77.0)
Platelets: 257 10*3/uL (ref 150.0–400.0)
RBC: 4.88 Mil/uL (ref 4.22–5.81)
RDW: 12.9 % (ref 11.5–15.5)
WBC: 8.3 10*3/uL (ref 4.0–10.5)

## 2014-09-09 LAB — BASIC METABOLIC PANEL
BUN: 13 mg/dL (ref 6–23)
CO2: 27 mEq/L (ref 19–32)
Calcium: 9.7 mg/dL (ref 8.4–10.5)
Chloride: 106 mEq/L (ref 96–112)
Creatinine, Ser: 0.8 mg/dL (ref 0.4–1.5)
GFR: 98.93 mL/min (ref >60.00)
Glucose, Bld: 109 mg/dL — ABNORMAL HIGH (ref 70–99)
Potassium: 4.6 mEq/L (ref 3.5–5.1)
Sodium: 142 mEq/L (ref 135–145)

## 2014-09-09 LAB — LIPID PANEL
Cholesterol: 221 mg/dL — ABNORMAL HIGH (ref 0–200)
HDL: 58.7 mg/dL (ref >39.00)
LDL Cholesterol: 130 mg/dL — ABNORMAL HIGH (ref 0–99)
NonHDL: 162.3
Total CHOL/HDL Ratio: 4
Triglycerides: 163 mg/dL — ABNORMAL HIGH (ref 0.0–149.0)
VLDL: 32.6 mg/dL (ref 0.0–40.0)

## 2014-09-09 LAB — PSA, MEDICARE: PSA: 3.16 ng/ml (ref 0.10–4.00)

## 2014-09-09 LAB — VITAMIN D 25 HYDROXY (VIT D DEFICIENCY, FRACTURES): VITD: 41.53 ng/mL (ref 30.00–100.00)

## 2014-09-09 LAB — VITAMIN B12: Vitamin B-12: 258 pg/mL (ref 211–911)

## 2014-09-11 ENCOUNTER — Ambulatory Visit (INDEPENDENT_AMBULATORY_CARE_PROVIDER_SITE_OTHER): Payer: Medicare Other | Admitting: Family Medicine

## 2014-09-11 ENCOUNTER — Encounter: Payer: Self-pay | Admitting: Family Medicine

## 2014-09-11 ENCOUNTER — Other Ambulatory Visit: Payer: Self-pay | Admitting: *Deleted

## 2014-09-11 VITALS — BP 150/84 | HR 80 | Temp 97.8°F | Ht 69.0 in | Wt 170.0 lb

## 2014-09-11 DIAGNOSIS — Z7189 Other specified counseling: Secondary | ICD-10-CM | POA: Insufficient documentation

## 2014-09-11 DIAGNOSIS — Z23 Encounter for immunization: Secondary | ICD-10-CM

## 2014-09-11 DIAGNOSIS — Z720 Tobacco use: Secondary | ICD-10-CM

## 2014-09-11 DIAGNOSIS — C61 Malignant neoplasm of prostate: Secondary | ICD-10-CM

## 2014-09-11 DIAGNOSIS — F17200 Nicotine dependence, unspecified, uncomplicated: Secondary | ICD-10-CM

## 2014-09-11 DIAGNOSIS — K222 Esophageal obstruction: Secondary | ICD-10-CM

## 2014-09-11 DIAGNOSIS — R0989 Other specified symptoms and signs involving the circulatory and respiratory systems: Secondary | ICD-10-CM

## 2014-09-11 DIAGNOSIS — I6523 Occlusion and stenosis of bilateral carotid arteries: Secondary | ICD-10-CM | POA: Insufficient documentation

## 2014-09-11 DIAGNOSIS — I739 Peripheral vascular disease, unspecified: Secondary | ICD-10-CM

## 2014-09-11 DIAGNOSIS — E785 Hyperlipidemia, unspecified: Secondary | ICD-10-CM

## 2014-09-11 DIAGNOSIS — I6529 Occlusion and stenosis of unspecified carotid artery: Secondary | ICD-10-CM | POA: Insufficient documentation

## 2014-09-11 DIAGNOSIS — J449 Chronic obstructive pulmonary disease, unspecified: Secondary | ICD-10-CM

## 2014-09-11 DIAGNOSIS — I10 Essential (primary) hypertension: Secondary | ICD-10-CM

## 2014-09-11 DIAGNOSIS — Z0000 Encounter for general adult medical examination without abnormal findings: Secondary | ICD-10-CM | POA: Insufficient documentation

## 2014-09-11 DIAGNOSIS — M5136 Other intervertebral disc degeneration, lumbar region: Secondary | ICD-10-CM

## 2014-09-11 HISTORY — DX: Occlusion and stenosis of unspecified carotid artery: I65.29

## 2014-09-11 MED ORDER — HYDROCODONE-ACETAMINOPHEN 5-325 MG PO TABS: 1.0000 | ORAL_TABLET | Freq: Four times a day (QID) | ORAL | Status: DC | PRN

## 2014-09-11 NOTE — Assessment & Plan Note (Signed)
Reviewed with patient. Goal LDL <100 given smoking history. Discussed diet changes to lower chol levels. Consider statin.

## 2014-09-11 NOTE — Assessment & Plan Note (Signed)
Goal LDL <100, ideally lower = better. Will refer back to [facility] given pt endorsing worsening claudication sxs. Again discussed importance of smoking cessation.

## 2014-09-11 NOTE — Assessment & Plan Note (Signed)
Continue to encourage cessation.  Precontemplative. 60+PY hx Again discussed LRCT lung cancer screening - pt states he underwent screening x3 years at [facility]

## 2014-09-11 NOTE — Addendum Note (Signed)
Addended by: Royann Shivers A on: 09/11/2014 01:00 PM   Modules accepted: Orders

## 2014-09-11 NOTE — Assessment & Plan Note (Signed)

## 2014-09-11 NOTE — Assessment & Plan Note (Signed)
Advanced directives: has living will at home. Wife is HCPOA. "no extraordinary efforts" does not want feeding tube.

## 2014-09-11 NOTE — Telephone Encounter (Signed)
Ok to refill? Patient came in and said he doesn't have any left and would like to p/u today if possible.

## 2014-09-11 NOTE — Progress Notes (Signed)
Pre visit review using our clinic review tool, if applicable. No additional management support is needed unless otherwise documented below in the visit note. 

## 2014-09-11 NOTE — Progress Notes (Signed)
BP 150/84  Pulse 80  Temp(Src) 97.8 F (36.6 C) (Oral)  Ht 5\' 9"  (1.753 m)  Wt 170 lb (77.111 kg)  BMI 25.09 kg/m2   CC: medicare wellness  Subjective:    Patient ID: Parker Martinez, male    DOB: 12/22/1940, 73 y.o.   MRN: [id number]  HPI: Parker Martinez is a 73 y.o. male presenting on 09/11/2014 for Annual Exam   Daughter just transitioned to hospice care this week for breast cancer. Understandably upset over this.  BP staying elevated. Prior on HCTZ, but felt fatigued while on this.  BP Readings from Last 3 Encounters:  09/11/14 150/84  06/10/14 150/76  03/30/14 130/80  COPD - albuterol prn. Dulera and spiriva regularly.  Smoker - 1/2 ppd. Also uses vapes. Not allowed to smoke in the house States did go through lung cancer screening study through [place]. Had CT scan for 3 years - this was around 2008  Vision screen - recent appt with ophtho.  Hearing - trouble at high frequencies. Referred to Dr. Janace Hoard ENT, due for this as well. Trouble with pressure changes but never told has ETD.  No falls in last year - fall discussed at screening was from 2013. Denies depression, anhedonia, dealing with daughter's illness.  Preventative: COLONOSCOPY Date: 08/2013 4 tubular adenomas, severe divertic, rpt 3 yrs [name]).  Prostate cancer - currently active surveillance (Dr. [name] at [facility]). Due for f/u, has had trouble getting through to his office. We will schedule this. Flu shot today Tetanus - unsure Pneumonia 2011, prevnar today Shingles shot - will check with insurance. Advanced directives: has living will at home. Wife is HCPOA. "no extraordinary efforts" does not want feeding tube.  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  Relevant past medical, surgical, family and social history reviewed and updated as indicated.  Allergies and medications reviewed and updated. Current Outpatient Prescriptions on File Prior to Visit  Medication Sig  . albuterol (PROVENTIL HFA;VENTOLIN HFA) 108 (90 BASE) MCG/ACT inhaler Inhale 2 puffs into the lungs every 6 (six) hours as needed for wheezing.  . cyclobenzaprine (FLEXERIL) 10 MG tablet TAKE ONE TABLET BY MOUTH TWICE DAILY AS NEEDED FOR MUSCLE SPASMS  . fluticasone (FLONASE) 50 MCG/ACT nasal spray USE TWO SPRAYS IN EACH NOSTRIL DAILY  . HYDROcodone-acetaminophen (NORCO/VICODIN) 5-325 MG per tablet Take 1 tablet by mouth every 6 (six) hours as needed for moderate pain.  . mometasone-formoterol (DULERA) 200-5 MCG/ACT AERO Inhale 1 puff into the lungs 2 times daily as directed  . montelukast (SINGULAIR) 10 MG tablet Take one every night at bedtime.  . pantoprazole (PROTONIX) 40 MG tablet Take 1 tablet (40 mg total) by mouth daily before breakfast. Take 1 tablet by mouth each day 30 minutes before a meal  . tiotropium (SPIRIVA HANDIHALER) 18 MCG inhalation capsule Place 1 capsule into inhaler and inhale once daily as directed.  Marland Kitchen aspirin 81 MG tablet Take 81 mg by mouth daily.    Marland Kitchen EPINEPHrine (EPIPEN) 0.3 mg/0.3 mL DEVI Inject 0.3 mLs (0.3 mg total) into the muscle once.   No current facility-administered medications on file prior to visit.    Review of Systems Per HPI unless specifically indicated above    Objective:    BP 150/84  Pulse 80  Temp(Src) 97.8 F (36.6 C) (Oral)  Ht 5\' 9"  (1.753 m)  Wt 170 lb (77.111 kg)  BMI 25.09 kg/m2  Physical Exam  Nursing note and vitals reviewed. Constitutional: He is oriented to person, place, and time. He appears well-developed and well-nourished. No distress.  HENT:  Head: Normocephalic and atraumatic.  Right Ear: Hearing, tympanic membrane, external ear and ear canal normal.  Left Ear:  Hearing, tympanic membrane, external ear and ear canal normal.  Nose: Nose normal.  Mouth/Throat: Uvula is midline, oropharynx is clear and moist and mucous membranes are normal. No oropharyngeal exudate, posterior oropharyngeal edema or posterior oropharyngeal erythema.  Eyes: Conjunctivae and EOM are normal. Pupils are equal, round, and reactive to light. No scleral icterus.  Neck: Normal range of motion. Neck supple. Carotid bruit is present (R). No thyromegaly present.  Cardiovascular: Normal rate, regular rhythm, normal heart sounds and intact distal pulses.   No murmur heard. Pulses:      Radial pulses are 2+ on the right side, and 2+ on the left side.  Pulmonary/Chest: Effort normal and breath sounds normal. No respiratory distress. He has no wheezes. He has no rales.  coarse  Abdominal: Soft. Bowel sounds are normal. He exhibits no distension and no mass. There is tenderness (mild discomfort RUQ). There is no rebound and no guarding.  Musculoskeletal: Normal range of motion. He exhibits no edema.  Lymphadenopathy:    He has no cervical adenopathy.  Neurological: He is alert and oriented to person, place, and time.  CN grossly intact, station and gait intact Recall 3/3 Calculation 5/5 serial 3s  Skin: Skin is warm and dry. No rash noted.  Psychiatric: He has a normal mood and affect. His behavior is normal. Judgment and thought content normal.   Results for orders placed in visit on 09/08/14  PSA, MEDICARE      Result Value Ref Range   PSA 3.16  0.10 - 4.00 ng/ml  LIPID PANEL      Result Value Ref Range   Cholesterol 221 (*) 0 - 200 mg/dL   Triglycerides 163.0 (*) 0.0 - 149.0 mg/dL   HDL 58.70  >39.00 mg/dL   VLDL 32.6  0.0 - 40.0 mg/dL   LDL Cholesterol 130 (*) 0 - 99 mg/dL   Total CHOL/HDL Ratio 4     NonHDL 390.30    BASIC METABOLIC PANEL      Result Value Ref Range   Sodium 142  135 - 145 mEq/L   Potassium 4.6  3.5 - 5.1 mEq/L   Chloride 106  96 - 112 mEq/L   CO2 27   19 - 32 mEq/L   Glucose, Bld 109 (*) 70 - 99 mg/dL   BUN 13  6 - 23 mg/dL   Creatinine, Ser 0.8  0.4 - 1.5 mg/dL   Calcium 9.7  8.4 - 10.5 mg/dL   GFR 98.93  >60.00 mL/min  VITAMIN B12      Result Value Ref Range   Vitamin B-12 258  211 - 911 pg/mL  VITAMIN D 25 HYDROXY      Result Value Ref Range   VITD 41.53  30.00 - 100.00 ng/mL  CBC WITH DIFFERENTIAL      Result Value Ref Range   WBC 8.3  4.0 - 10.5 K/uL   RBC 4.88  4.22 - 5.81 Mil/uL   Hemoglobin 15.9  13.0 - 17.0 g/dL   HCT 47.1  39.0 - 52.0 %   MCV 96.4  78.0 - 100.0 fl   MCHC 33.7  30.0 - 36.0 g/dL   RDW 12.9  11.5 - 15.5 %   Platelets 257.0  150.0 - 400.0 K/uL   Neutrophils Relative % 58.6  43.0 - 77.0 %   Lymphocytes Relative 28.2  12.0 - 46.0 %   Monocytes Relative 10.7  3.0 - 12.0 %   Eosinophils Relative 2.3  0.0 - 5.0 %   Basophils Relative 0.2  0.0 - 3.0 %   Neutro Abs 4.9  1.4 - 7.7 K/uL   Lymphs Abs 2.3  0.7 - 4.0 K/uL   Monocytes Absolute 0.9  0.1 - 1.0 K/uL   Eosinophils Absolute 0.2  0.0 - 0.7 K/uL   Basophils Absolute 0.0  0.0 - 0.1 K/uL      Assessment & Plan:   Problem List Items Addressed This Visit   Right carotid bruit     Schedule carotid US    Relevant Orders      Doppler carotid   Prostate cancer     Refer back to Dr. [name] - overdue.    Relevant Orders      Ambulatory referral to Urology   PAD (peripheral artery disease)     Goal LDL <100, ideally lower = better. Will refer back to [facility] given pt endorsing worsening claudication sxs. Again discussed importance of smoking cessation.    Relevant Orders      Ambulatory referral to Vascular Surgery   Medicare annual wellness visit, initial - Primary     I have personally reviewed the Medicare Annual Wellness questionnaire and have noted 1. The patient's medical and social history 2. Their use of alcohol, tobacco or illicit drugs 3. Their current medications and supplements 4. The patient's functional ability including ADL's, fall  risks, home safety risks and hearing or visual impairment. 5. Diet and physical activity 6. Evidence for depression or mood disorders The patients weight, height, BMI have been recorded in the chart.  Hearing and vision has been addressed. I have made referrals, counseling and provided education to the patient based review of the above and I have provided the pt with a written personalized care plan for preventive services. Provider list updated - see scanned questionairre.  Reviewed preventative protocols and updated unless pt declined.    HTN (hypertension)     Monitor bp at home and if persistently elevated rec restart antihypertensive. Recheck next vist.    ESOPHAGEAL STRICTURE     Pt will call to reschedule titration.    Dyslipidemia     Reviewed with patient. Goal LDL <100 given smoking history. Discussed diet changes to lower chol levels. Consider statin.    DDD (degenerative disc disease), lumbar     Hydrocodone prn.    Current smoker     Continue to encourage cessation.  Precontemplative. 60+PY hx Again discussed LRCT lung cancer screening - pt states he underwent screening x3 years at [facility]    COPD (chronic obstructive pulmonary disease)     Compliant with triple therapy, rare albuterol use.    Advanced care planning/counseling discussion     Advanced directives: has living will at home. Wife is HCPOA. "no extraordinary efforts" does not want feeding tube.         Follow up plan: Return in about 6 months (around 03/13/2015), or as needed, for follow up.

## 2014-09-11 NOTE — Telephone Encounter (Signed)
Discussed at visit today Printed and placed in [name]'s box.

## 2014-09-11 NOTE — Telephone Encounter (Signed)
Patient notified and Rx placed up front for pick up. 

## 2014-09-11 NOTE — Assessment & Plan Note (Signed)
Pt will call to reschedule titration.

## 2014-09-11 NOTE — Assessment & Plan Note (Signed)
Schedule carotid US

## 2014-09-11 NOTE — Patient Instructions (Signed)
Flu shot today. Restart b12 supplement 500-1072mcg daily. Bring me copy of your living will to update chart. Pass by Marion's office to schedule appointment with Dr. [name], Dr. Scot Dock, and carotid ultrasound. Call your insurance about the shingles shot to see if it is covered or how much it would cost and where is cheaper (here or pharmacy).  If you want to receive here, call for nurse visit.  You need to fully quit smoking. Good to see you today, call us with questions. Return as needed or in 6 months for follow up.

## 2014-09-11 NOTE — Assessment & Plan Note (Signed)
Monitor bp at home and if persistently elevated rec restart antihypertensive. Recheck next vist.

## 2014-09-11 NOTE — Assessment & Plan Note (Signed)
Compliant with triple therapy, rare albuterol use.

## 2014-09-11 NOTE — Assessment & Plan Note (Signed)
Refer back to Dr. [name] - overdue.

## 2014-09-11 NOTE — Assessment & Plan Note (Signed)
Hydrocodone prn.

## 2014-09-18 ENCOUNTER — Ambulatory Visit (INDEPENDENT_AMBULATORY_CARE_PROVIDER_SITE_OTHER): Payer: Medicare Other | Admitting: Internal Medicine

## 2014-09-18 ENCOUNTER — Encounter: Payer: Self-pay | Admitting: Internal Medicine

## 2014-09-18 VITALS — BP 122/64 | HR 72 | Ht 67.5 in | Wt 172.0 lb

## 2014-09-18 DIAGNOSIS — R1310 Dysphagia, unspecified: Secondary | ICD-10-CM

## 2014-09-18 DIAGNOSIS — R1314 Dysphagia, pharyngoesophageal phase: Secondary | ICD-10-CM

## 2014-09-18 DIAGNOSIS — K222 Esophageal obstruction: Secondary | ICD-10-CM

## 2014-09-18 DIAGNOSIS — R1319 Other dysphagia: Secondary | ICD-10-CM

## 2014-09-18 NOTE — Progress Notes (Addendum)
Subjective:    Patient ID: Parker Martinez, male    DOB: 03-18-1941, 73 y.o.   MRN: [id number]  HPI Patient is here with complaints of recurrent dysphagia. I have dilated him a number of years ago. He is on his PPI. He is having intermittent solid food dysphagia at this time, similar to what he has had in the past. It's been coming on progressively worsening over time. Denies weight loss or bleeding.  No Known Allergies Outpatient Prescriptions Prior to Visit  Medication Sig Dispense Refill  . albuterol (PROVENTIL HFA;VENTOLIN HFA) 108 (90 BASE) MCG/ACT inhaler Inhale 2 puffs into the lungs every 6 (six) hours as needed for wheezing.      Marland Kitchen aspirin 81 MG tablet Take 81 mg by mouth daily.        . cyclobenzaprine (FLEXERIL) 10 MG tablet TAKE ONE TABLET BY MOUTH TWICE DAILY AS NEEDED FOR MUSCLE SPASMS  30 tablet  1  . EPINEPHrine (EPIPEN) 0.3 mg/0.3 mL DEVI Inject 0.3 mLs (0.3 mg total) into the muscle once.  2 Device  1  . fluticasone (FLONASE) 50 MCG/ACT nasal spray USE TWO SPRAYS IN EACH NOSTRIL DAILY  16 g  1  . HYDROcodone-acetaminophen (NORCO/VICODIN) 5-325 MG per tablet Take 1 tablet by mouth every 6 (six) hours as needed for moderate pain.  50 tablet  0  . mometasone-formoterol (DULERA) 200-5 MCG/ACT AERO Inhale 1 puff into the lungs 2 times daily as directed  13 g  3  . montelukast (SINGULAIR) 10 MG tablet Take one every night at bedtime.  30 tablet  11  . pantoprazole (PROTONIX) 40 MG tablet Take 1 tablet (40 mg total) by mouth daily before breakfast. Take 1 tablet by mouth each day 30 minutes before a meal  30 tablet  5  . tiotropium (SPIRIVA HANDIHALER) 18 MCG inhalation capsule Place 1 capsule into inhaler and inhale once daily as directed.  30 capsule  3  . vitamin B-12 (CYANOCOBALAMIN) 500 MCG tablet Take 500 mcg by mouth daily.       No facility-administered medications prior to visit.   Past Medical History  Diagnosis Date  . Allergic rhinitis   . Status post dilatation  of esophageal stricture   . Duodenitis   . COPD (chronic obstructive pulmonary disease)     severe, participated in [place] study  . Prostate cancer 2011    active surveillance (Dr. [name])  . GERD (gastroesophageal reflux disease)   . History of kidney stones remote  . PAD (peripheral artery disease)   . Idiopathic anaphylaxis     h/o recurrent anaphylaxis, unknown trigger, s/p eval at [place] [phone]), last episode 03/2011  . Arthritis     lumbar and cervical spine  . Smoker   . Diverticulosis 2008    severe by colonoscopy and CT  . Hemangioma     congenital left arm  . DDD (degenerative disc disease)     lumbar (HNP R L3/4) s/p ESI, cervical  . HTN (hypertension)     borderline readings at home  . Esophageal stricture 2012    s/p dilation  . Personal history of colonic adenomas 09/02/2013  . Diverticulitis 2015    by CT   Past Surgical History  Procedure Laterality Date  . Upper gastrointestinal endoscopy  06/08/2009    dilated esophageal stricture, duodenitis  . Shoulder surgery  1961    due to chronic dislocation  . Tonsillectomy  1944 ?  Marland Kitchen Virtual colonoscopy  2007    Teja Judice, rpt rec 5 yrs  . Spirometry  10/2010    severe obstruction, FEV1 41%, ratio 0.46  . Colonoscopy  08/2013    4 tubular adenomas, severe divertic, rpt 3 yrs [name])   History   Social History  . Marital Status: Married    Spouse Name: N/A    Number of Children: 1  . Years of Education: N/A   Occupational History  . retired    Social History Main Topics  . Smoking status: Current Every Day Smoker -- 0.50 packs/day for 60 years    Types: Cigarettes  . Smokeless tobacco: Never Used     Comment: would like to quit not ready to. use vapor cig  . Alcohol Use: None     Comment: socially  . Drug Use: No  . Sexual Activity: None   Other Topics Concern  . 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 Wife is HCPOA. "no extraordinary efforts" does not want feeding tube.      Tested negative for hydrocodone and positive for alcohol on UDS 10/2013.  Recommend Q6 mo testing and will need to discuss EtOH use at next visit.   Family History  Problem Relation Age of Onset  . Lung cancer Father   . Hyperlipidemia Father   . Coronary artery disease Neg Hx   . Stroke Neg Hx   . Diabetes Neg Hx   . Arthritis Mother   . Breast cancer Daughter        Review of Systems Chronic back pain. He has followup of his atherosclerosis pending in the near future with Dr. Scot Dock and also he has a Doppler carotid ultrasound pending. A new bruit was heard on exam.    Objective:   Physical Exam General:  NAD Eyes:   anicteric Lungs:  clear Heart:  S1S2 no rubs, murmurs or gallops    Data Reviewed:   Prior upper endoscopy procedure, with dilation. This was in 2010.    Assessment & Plan:   1. Esophageal dysphagia   2. Esophageal stricture    Plan for upper endoscopy with esophageal dilation likely with balloon as done before. Endoscopy risks please.  I appreciate the opportunity to care for this patient.   CC: Ria Bush, MD

## 2014-09-18 NOTE — Patient Instructions (Addendum)
You have been given a separate informational sheet regarding your tobacco use, the importance of quitting and local resources to help you quit.  You have been scheduled for an endoscopy. Please follow written instructions given to you at your visit today. If you use inhalers (even only as needed), please bring them with you on the day of your procedure. Your physician has requested that you go to www.startemmi.com and enter the access code given to you at your visit today. This web site gives a general overview about your procedure. However, you should still follow specific instructions given to you by our office regarding your preparation for the procedure.   I appreciate the opportunity to care for you.

## 2014-09-20 ENCOUNTER — Encounter: Payer: Self-pay | Admitting: Family Medicine

## 2014-09-23 ENCOUNTER — Encounter (HOSPITAL_COMMUNITY): Payer: Self-pay | Admitting: *Deleted

## 2014-09-24 ENCOUNTER — Encounter (HOSPITAL_COMMUNITY): Payer: Self-pay | Admitting: Pharmacy Technician

## 2014-09-25 ENCOUNTER — Other Ambulatory Visit: Payer: Self-pay | Admitting: *Deleted

## 2014-09-25 DIAGNOSIS — I739 Peripheral vascular disease, unspecified: Secondary | ICD-10-CM

## 2014-09-28 ENCOUNTER — Encounter: Payer: Self-pay | Admitting: Internal Medicine

## 2014-09-29 ENCOUNTER — Encounter (INDEPENDENT_AMBULATORY_CARE_PROVIDER_SITE_OTHER): Payer: Medicare Other

## 2014-09-29 DIAGNOSIS — R0989 Other specified symptoms and signs involving the circulatory and respiratory systems: Secondary | ICD-10-CM

## 2014-10-01 NOTE — Anesthesia Preprocedure Evaluation (Addendum)
Anesthesia Evaluation  Patient identified by MRN, date of birth, ID band Patient awake    Reviewed: Allergy & Precautions, H&P , NPO status , Patient's Chart, lab work & pertinent test results  History of Anesthesia Complications Negative for: history of anesthetic complications  Airway Mallampati: II  TM Distance: >3 FB Neck ROM: Full    Dental  (+) Poor Dentition, Missing, Dental Advisory Given, Chipped   Pulmonary COPD COPD inhaler, Current Smoker,  breath sounds clear to auscultation  Pulmonary exam normal       Cardiovascular hypertension, Pt. on medications + Peripheral Vascular Disease Rhythm:Regular Rate:Normal     Neuro/Psych negative neurological ROS  negative psych ROS   GI/Hepatic Neg liver ROS, GERD-  Medicated and Controlled,  Endo/Other  negative endocrine ROS  Renal/GU negative Renal ROS  negative genitourinary   Musculoskeletal  (+) Arthritis -, Osteoarthritis,    Abdominal   Peds negative pediatric ROS (+)  Hematology negative hematology ROS (+)   Anesthesia Other Findings Idiopathetic anaphylaxis  Reproductive/Obstetrics negative OB ROS                           Anesthesia Physical Anesthesia Plan  ASA: III  Anesthesia Plan: MAC   Post-op Pain Management:    Induction: Intravenous  Airway Management Planned: Nasal Cannula  Additional Equipment:   Intra-op Plan:   Post-operative Plan: Extubation in OR  Informed Consent: I have reviewed the patients History and Physical, chart, labs and discussed the procedure including the risks, benefits and alternatives for the proposed anesthesia with the patient or authorized representative who has indicated his/her understanding and acceptance.   Dental advisory given  Plan Discussed with: CRNA  Anesthesia Plan Comments:         Anesthesia Quick Evaluation

## 2014-10-02 ENCOUNTER — Encounter (HOSPITAL_COMMUNITY): Payer: Self-pay | Admitting: *Deleted

## 2014-10-02 ENCOUNTER — Encounter (HOSPITAL_COMMUNITY)
Admission: RE | Disposition: A | Discharge status: Home / Self Care | Payer: Self-pay | Source: Ambulatory Visit | Attending: Internal Medicine

## 2014-10-02 ENCOUNTER — Ambulatory Visit (HOSPITAL_COMMUNITY): Payer: Medicare Other | Admitting: Anesthesiology

## 2014-10-02 ENCOUNTER — Encounter (HOSPITAL_COMMUNITY): Payer: Medicare Other | Admitting: Anesthesiology

## 2014-10-02 ENCOUNTER — Ambulatory Visit (HOSPITAL_COMMUNITY)
Admission: RE | Admit: 2014-10-02 | Discharge: 2014-10-02 | Disposition: A | Discharge status: Home / Self Care | Payer: Medicare Other | Source: Ambulatory Visit | Attending: Internal Medicine | Admitting: Internal Medicine

## 2014-10-02 DIAGNOSIS — M5030 Other cervical disc degeneration, unspecified cervical region: Secondary | ICD-10-CM | POA: Insufficient documentation

## 2014-10-02 DIAGNOSIS — Z8546 Personal history of malignant neoplasm of prostate: Secondary | ICD-10-CM | POA: Diagnosis not present

## 2014-10-02 DIAGNOSIS — M5136 Other intervertebral disc degeneration, lumbar region: Secondary | ICD-10-CM | POA: Diagnosis not present

## 2014-10-02 DIAGNOSIS — K208 Other esophagitis: Secondary | ICD-10-CM | POA: Insufficient documentation

## 2014-10-02 DIAGNOSIS — M549 Dorsalgia, unspecified: Secondary | ICD-10-CM | POA: Insufficient documentation

## 2014-10-02 DIAGNOSIS — M1389 Other specified arthritis, multiple sites: Secondary | ICD-10-CM | POA: Insufficient documentation

## 2014-10-02 DIAGNOSIS — I739 Peripheral vascular disease, unspecified: Secondary | ICD-10-CM | POA: Insufficient documentation

## 2014-10-02 DIAGNOSIS — I10 Essential (primary) hypertension: Secondary | ICD-10-CM | POA: Insufficient documentation

## 2014-10-02 DIAGNOSIS — K219 Gastro-esophageal reflux disease without esophagitis: Secondary | ICD-10-CM | POA: Insufficient documentation

## 2014-10-02 DIAGNOSIS — R1319 Other dysphagia: Secondary | ICD-10-CM

## 2014-10-02 DIAGNOSIS — K222 Esophageal obstruction: Secondary | ICD-10-CM | POA: Diagnosis not present

## 2014-10-02 DIAGNOSIS — R1314 Dysphagia, pharyngoesophageal phase: Secondary | ICD-10-CM

## 2014-10-02 DIAGNOSIS — F17210 Nicotine dependence, cigarettes, uncomplicated: Secondary | ICD-10-CM | POA: Diagnosis not present

## 2014-10-02 DIAGNOSIS — J449 Chronic obstructive pulmonary disease, unspecified: Secondary | ICD-10-CM | POA: Diagnosis not present

## 2014-10-02 DIAGNOSIS — R1310 Dysphagia, unspecified: Secondary | ICD-10-CM | POA: Diagnosis present

## 2014-10-02 DIAGNOSIS — I7090 Unspecified atherosclerosis: Secondary | ICD-10-CM | POA: Diagnosis not present

## 2014-10-02 DIAGNOSIS — G8929 Other chronic pain: Secondary | ICD-10-CM | POA: Diagnosis not present

## 2014-10-02 HISTORY — PX: BALLOON DILATION: SHX5330

## 2014-10-02 HISTORY — PX: ESOPHAGOGASTRODUODENOSCOPY (EGD) WITH PROPOFOL: SHX5813

## 2014-10-02 SURGERY — ESOPHAGOGASTRODUODENOSCOPY (EGD) WITH PROPOFOL
Anesthesia: Monitor Anesthesia Care

## 2014-10-02 MED ORDER — PROPOFOL 10 MG/ML IV EMUL
INTRAVENOUS | Status: DC | PRN
  Administered 2014-10-02 (×4): 40 mg via INTRAVENOUS

## 2014-10-02 MED ORDER — PROPOFOL INFUSION 10 MG/ML OPTIME
INTRAVENOUS | Status: DC | PRN
Start: 2014-10-02 — End: 2014-10-02
  Administered 2014-10-02: 160 ug/kg/min via INTRAVENOUS

## 2014-10-02 MED ORDER — PROPOFOL 10 MG/ML IV BOLUS
INTRAVENOUS | Status: AC
Start: 2014-10-02 — End: 2014-10-02
  Filled 2014-10-02: qty 20

## 2014-10-02 MED ORDER — LACTATED RINGERS IV SOLN
INTRAVENOUS | Status: DC | PRN
  Administered 2014-10-02: 14:00:00 via INTRAVENOUS

## 2014-10-02 MED ORDER — PROPOFOL 10 MG/ML IV BOLUS
INTRAVENOUS | Status: AC
  Filled 2014-10-02: qty 20

## 2014-10-02 MED ORDER — SODIUM CHLORIDE 0.9 % IV SOLN: INTRAVENOUS | Status: DC

## 2014-10-02 MED ORDER — LIDOCAINE HCL (CARDIAC) 20 MG/ML IV SOLN
INTRAVENOUS | Status: AC
  Filled 2014-10-02: qty 5

## 2014-10-02 SURGICAL SUPPLY — 14 items

## 2014-10-02 NOTE — Discharge Instructions (Addendum)
You have rings (thin strictures) in the lower esophagus again. I dilalted them. I also took biopsies as I had thought acid reflux had caused your problems but now ? If an allergic condition may also be related.  I will call with results.  Please stay on pantoprazole.  YOU HAD AN ENDOSCOPIC PROCEDURE TODAY: Refer to the procedure report and other information in the discharge instructions given to you for any specific questions about what was found during the examination. If this information does not answer your questions, please call Dr. Celesta Aver office at [phone] to clarify.   YOU SHOULD EXPECT: Some feelings of bloating in the abdomen. Passage of more gas than usual. Walking can help get rid of the air that was put into your GI tract during the procedure and reduce the bloating. DIET: Your first meal following the procedure should be clear liquids until 4 PM then soft foods. Try regular food tomorrow.  Drink plenty of fluids but you should avoid alcoholic beverages for 24 hours.   ACTIVITY: Your care partner should take you home directly after the procedure. You should plan to take it easy, moving slowly for the rest of the day. You can resume normal activity the day after the procedure however YOU SHOULD NOT DRIVE, use power tools, machinery or perform tasks that involve climbing or major physical exertion for 24 hours (because of the sedation medicines used during the test).   SYMPTOMS TO REPORT IMMEDIATELY: A gastroenterologist can be reached at any hour. Please call [phone]  for any of the following symptoms:   Following upper endoscopy (EGD, EUS, ERCP, esophageal dilation) Vomiting of blood or coffee ground material  New, significant abdominal pain  New, significant chest pain or pain under the shoulder blades  Painful or persistently difficult swallowing  New shortness of breath  Black, tarry-looking or red, bloody stools  FOLLOW UP:  If any biopsies were taken  you will be contacted by phone or by letter within the next 1-3 weeks. Call [phone]  if you have not heard about the biopsies in 3 weeks.  Please also call with any specific questions about appointments or follow up tests.    Esophagogastroduodenoscopy Care After Refer to this sheet in the next few weeks. These instructions provide you with information on caring for yourself after your procedure. Your caregiver may also give you more specific instructions. Your treatment has been planned according to current medical practices, but problems sometimes occur. Call your caregiver if you have any problems or questions after your procedure.  HOME CARE INSTRUCTIONS  Do not eat or drink anything until the numbing medicine (local anesthetic) has worn off and your gag reflex has returned. You will know that the local anesthetic has worn off when you can swallow comfortably.  Do not drive for 12 hours after the procedure or as directed by your caregiver.  Only take medicines as directed by your caregiver. SEEK MEDICAL CARE IF:   You cannot stop coughing.  You are not urinating at all or less than usual. SEEK IMMEDIATE MEDICAL CARE IF:  You have difficulty swallowing.  You cannot eat or drink.  You have worsening throat or chest pain.  You have dizziness, lightheadedness, or you faint.  You have nausea or vomiting.  You have chills.  You have a fever.  You have severe abdominal pain.  You have black, tarry, or bloody stools. Document Released: 11/06/2012 Document Reviewed: 11/06/2012 [facility] Patient Information 2015 [place]. This information is not  intended to replace advice given to you by your health care provider. Make sure you discuss any questions you have with your health care provider.

## 2014-10-02 NOTE — H&P (View-Only) (Signed)
Subjective:    Patient ID: [name], male    DOB: [birth year], 73 y.o.   MRN: [id number]  HPI Patient is here with complaints of recurrent dysphagia. I have dilated him a number of years ago. He is on his PPI. He is having intermittent solid food dysphagia at this time, similar to what he has had in the past. It's been coming on progressively worsening over time. Denies weight loss or bleeding.  No Known Allergies Outpatient Prescriptions Prior to Visit  Medication Sig Dispense Refill  . albuterol (PROVENTIL HFA;VENTOLIN HFA) 108 (90 BASE) MCG/ACT inhaler Inhale 2 puffs into the lungs every 6 (six) hours as needed for wheezing.      Marland Kitchen aspirin 81 MG tablet Take 81 mg by mouth daily.        . cyclobenzaprine (FLEXERIL) 10 MG tablet TAKE ONE TABLET BY MOUTH TWICE DAILY AS NEEDED FOR MUSCLE SPASMS  30 tablet  1  . EPINEPHrine (EPIPEN) 0.3 mg/0.3 mL DEVI Inject 0.3 mLs (0.3 mg total) into the muscle once.  2 Device  1  . fluticasone (FLONASE) 50 MCG/ACT nasal spray USE TWO SPRAYS IN EACH NOSTRIL DAILY  16 g  1  . HYDROcodone-acetaminophen (NORCO/VICODIN) 5-325 MG per tablet Take 1 tablet by mouth every 6 (six) hours as needed for moderate pain.  50 tablet  0  . mometasone-formoterol (DULERA) 200-5 MCG/ACT AERO Inhale 1 puff into the lungs 2 times daily as directed  13 g  3  . montelukast (SINGULAIR) 10 MG tablet Take one every night at bedtime.  30 tablet  11  . pantoprazole (PROTONIX) 40 MG tablet Take 1 tablet (40 mg total) by mouth daily before breakfast. Take 1 tablet by mouth each day 30 minutes before a meal  30 tablet  5  . tiotropium (SPIRIVA HANDIHALER) 18 MCG inhalation capsule Place 1 capsule into inhaler and inhale once daily as directed.  30 capsule  3  . vitamin B-12 (CYANOCOBALAMIN) 500 MCG tablet Take 500 mcg by mouth daily.       No facility-administered medications prior to visit.   Past Medical History  Diagnosis Date  . Allergic rhinitis   . Status post dilatation  of esophageal stricture   . Duodenitis   . COPD (chronic obstructive pulmonary disease)     severe, participated in [place] study  . Prostate cancer 2011    active surveillance (Dr. [name])  . GERD (gastroesophageal reflux disease)   . History of kidney stones remote  . PAD (peripheral artery disease)   . Idiopathic anaphylaxis     h/o recurrent anaphylaxis, unknown trigger, s/p eval at [facility] [id number]), last episode 03/2011  . Arthritis     lumbar and cervical spine  . Smoker   . Diverticulosis 2008    severe by colonoscopy and CT  . Hemangioma     congenital left arm  . DDD (degenerative disc disease)     lumbar (HNP R L3/4) s/p ESI, cervical  . HTN (hypertension)     borderline readings at home  . Esophageal stricture 2012    s/p dilation  . Personal history of colonic adenomas 09/02/2013  . Diverticulitis 2015    by CT   Past Surgical History  Procedure Laterality Date  . Upper gastrointestinal endoscopy  06/08/2009    dilated esophageal stricture, duodenitis  . Shoulder surgery  1961    due to chronic dislocation  . Tonsillectomy  1944 ?  Marland Kitchen Virtual colonoscopy  2007    [name], rpt rec 5 yrs  . Spirometry  10/2010    severe obstruction, FEV1 41%, ratio 0.46  . Colonoscopy  08/2013    4 tubular adenomas, severe divertic, rpt 3 yrs [name])   History   Social History  . Marital Status: Married    Spouse Name: N/A    Number of Children: 1  . Years of Education: N/A   Occupational History  . retired    Social History Main Topics  . Smoking status: Current Every Day Smoker -- 0.50 packs/day for 60 years    Types: Cigarettes  . Smokeless tobacco: Never Used     Comment: would like to quit not ready to. use vapor cig  . Alcohol Use: None     Comment: socially  . Drug Use: No  . Sexual Activity: None   Other Topics Concern  . 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 Wife is HCPOA. "no extraordinary efforts" does not want feeding tube.      Tested negative for hydrocodone and positive for alcohol on UDS 10/2013.  Recommend Q6 mo testing and will need to discuss EtOH use at next visit.   Family History  Problem Relation Age of Onset  . Lung cancer Father   . Hyperlipidemia Father   . Coronary artery disease Neg Hx   . Stroke Neg Hx   . Diabetes Neg Hx   . Arthritis Mother   . Breast cancer Daughter        Review of Systems Chronic back pain. He has followup of his atherosclerosis pending in the near future with Dr. Scot Dock and also he has a Doppler carotid ultrasound pending. A new bruit was heard on exam.    Objective:   Physical Exam General:  NAD Eyes:   anicteric Lungs:  clear Heart:  S1S2 no rubs, murmurs or gallops    Data Reviewed:   Prior upper endoscopy procedure, with dilation. This was in 2010.    Assessment & Plan:   1. Esophageal dysphagia   2. Esophageal stricture    Plan for upper endoscopy with esophageal dilation likely with balloon as done before. Endoscopy risks please.  I appreciate the opportunity to care for this patient.   CC: Ria Bush, MD

## 2014-10-02 NOTE — Anesthesia Postprocedure Evaluation (Signed)
  Anesthesia Post-op Note  Patient: Parker Martinez  Procedure(s) Performed: Procedure(s) (LRB): ESOPHAGOGASTRODUODENOSCOPY (EGD) WITH PROPOFOL (N/A) BALLOON DILATION (N/A)  Patient Location: PACU  Anesthesia Type: MAC  Level of Consciousness: awake and alert   Airway and Oxygen Therapy: Patient Spontanous Breathing  Post-op Pain: mild  Post-op Assessment: Post-op Vital signs reviewed, Patient's Cardiovascular Status Stable, Respiratory Function Stable, Patent Airway and No signs of Nausea or vomiting  Last Vitals:  Filed Vitals:   10/02/14 1505  BP:   Pulse:   Temp:   Resp: 14    Post-op Vital Signs: stable   Complications: No apparent anesthesia complications

## 2014-10-02 NOTE — Interval H&P Note (Signed)
History and Physical Interval Note:  10/02/2014 2:12 PM  Parker Martinez  has presented today for surgery, with the diagnosis of esophageal dysphagia esophageal stricture  The various methods of treatment have been discussed with the patient and family. After consideration of risks, benefits and other options for treatment, the patient has consented to  Procedure(s): ESOPHAGOGASTRODUODENOSCOPY (EGD) WITH PROPOFOL (N/A) BALLOON DILATION (N/A) as a surgical intervention .  The patient's history has been reviewed, patient examined, no change in status, stable for surgery.  I have reviewed the patient's chart and labs.  Questions were answered to the patient's satisfaction.     Silvano Rusk

## 2014-10-02 NOTE — Op Note (Signed)
[place], [id number]   ENDOSCOPY PROCEDURE REPORT  PATIENT: Parker, Martinez  MR#: [id number] BIRTHDATE: November 01, 1941 , 41  yrs. old GENDER: male ENDOSCOPIST: Gatha Mayer, MD, [facility] PROCEDURE DATE:  10/02/2014 PROCEDURE:  EGD w/ biopsy   and balloon dilation < 30 mm ASA CLASS:     Class III INDICATIONS:  dysphagia and therapeutic procedure. MEDICATIONS: Monitored anesthesia care and Per Anesthesia TOPICAL ANESTHETIC: none  DESCRIPTION OF PROCEDURE: After the risks benefits and alternatives of the procedure were thoroughly explained, informed consent was obtained.  The Pentax Gastroscope [id number] endoscope was introduced through the mouth and advanced to the second portion of the duodenum , Without limitations.  The instrument was slowly withdrawn as the mucosa was fully examined.    1) Multiple rings in distal esophagus - scope would not pass until dilated to 15 and 16.5 mm with TTS balloon.  Good effect, appropriatre heme 2) Otherwise normal - I took esophageal biopsies to look for eosinophilic esophagitis.  Retroflexed views revealed no abnormalities.     The scope was then withdrawn from the patient and the procedure completed.  COMPLICATIONS: There were no immediate complications.  ENDOSCOPIC IMPRESSION: 1) Multiple rings in distal esophagus - scope would not pass until dilated to 15 and 16.5 mm with TTS balloon.  Good effect, appropriatre heme 2) Otherwise normal - I took esophageal biopsies to look for eosinophilic esophagitis   - can cause multiple rings  RECOMMENDATIONS: 1.  Clear liquids until , then soft foods rest of day.  Resume prior diet tomorrow. 2.  Office will call with results 3.  Continue PPI    eSigned:  Gatha Mayer, MD, [facility] 10/02/2014 2:45 PM   CC: The Patient

## 2014-10-02 NOTE — Transfer of Care (Signed)
Immediate Anesthesia Transfer of Care Note  Patient: Parker Martinez  Procedure(s) Performed: Procedure(s): ESOPHAGOGASTRODUODENOSCOPY (EGD) WITH PROPOFOL (N/A) BALLOON DILATION (N/A)  Patient Location: PACU  Anesthesia Type:MAC  Level of Consciousness: awake, alert  and oriented  Airway & Oxygen Therapy: Patient Spontanous Breathing and Patient connected to nasal cannula oxygen  Post-op Assessment: Report given to PACU RN and Post -op Vital signs reviewed and stable  Post vital signs: Reviewed and stable  Complications: No apparent anesthesia complications

## 2014-10-03 ENCOUNTER — Encounter: Payer: Self-pay | Admitting: Family Medicine

## 2014-10-05 ENCOUNTER — Encounter (HOSPITAL_COMMUNITY): Payer: Self-pay | Admitting: Internal Medicine

## 2014-10-05 ENCOUNTER — Encounter: Payer: Self-pay | Admitting: *Deleted

## 2014-10-09 ENCOUNTER — Encounter: Payer: Self-pay | Admitting: Family Medicine

## 2014-10-09 NOTE — Progress Notes (Signed)
Quick Note:  Biopsies show some inflammation - mild Please ask how he is doing with swallowing   ______

## 2014-10-20 ENCOUNTER — Encounter: Payer: Self-pay | Admitting: Vascular Surgery

## 2014-10-21 ENCOUNTER — Ambulatory Visit (INDEPENDENT_AMBULATORY_CARE_PROVIDER_SITE_OTHER): Payer: Medicare Other | Admitting: Vascular Surgery

## 2014-10-21 ENCOUNTER — Ambulatory Visit (HOSPITAL_COMMUNITY)
Admission: RE | Admit: 2014-10-21 | Discharge: 2014-10-21 | Disposition: A | Discharge status: Home / Self Care | Payer: Medicare Other | Source: Ambulatory Visit | Attending: Vascular Surgery | Admitting: Vascular Surgery

## 2014-10-21 ENCOUNTER — Encounter: Payer: Self-pay | Admitting: Vascular Surgery

## 2014-10-21 VITALS — BP 129/73 | HR 85 | Resp 18 | Ht 68.75 in | Wt 170.7 lb

## 2014-10-21 DIAGNOSIS — I70219 Atherosclerosis of native arteries of extremities with intermittent claudication, unspecified extremity: Secondary | ICD-10-CM

## 2014-10-21 DIAGNOSIS — I739 Peripheral vascular disease, unspecified: Secondary | ICD-10-CM | POA: Insufficient documentation

## 2014-10-21 DIAGNOSIS — I6523 Occlusion and stenosis of bilateral carotid arteries: Secondary | ICD-10-CM

## 2014-10-21 NOTE — Assessment & Plan Note (Signed)
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 His symptoms are stable and his ABIs are stable. We again discussed the importance of tobacco cessation in detail today. I've encouraged him to stay as active as possible. I bordered follow up ABIs in 1 year and I'll see him back at that time. He knows to call sooner if he has problems.

## 2014-10-21 NOTE — Assessment & Plan Note (Signed)
The patient has bilateral 40-59% carotid stenoses that are followed by his primary care physician. He is asymptomatic.

## 2014-10-21 NOTE — Progress Notes (Signed)
Patient ID: Parker Martinez, male   DOB: Jan 05, 1941, 73 y.o.   MRN: [id number]  Reason for Visit: Follow-up and PVD   Referred by Ria Bush, MD  Subjective:     HPI:  Parker Martinez is a 73 y.o. male who was last seen in our office on 05/22/2012 by the nurse practitioner.  Back then, ABI on the right was 68%. This is the symptomatic side. He continues to have stable claudication of the right calf. This is brought on by ambulation and relieved with rest. His symptoms occur at approximately 100 yards. His symptoms have been stable over the last year. He denies any significant symptoms in the left leg. He denies any rest pain. He denies any history of nonhealing ulcers.  He does continue to smoke 1 pack per day of cigarettes.   Past Medical History  Diagnosis Date  . Allergic rhinitis   . Status post dilatation of esophageal stricture   . Duodenitis   . COPD (chronic obstructive pulmonary disease)     severe, participated in [place] study  . Prostate cancer 2011    active surveillance (Dr. [name])  . GERD (gastroesophageal reflux disease)   . History of kidney stones remote  . PAD (peripheral artery disease)   . Idiopathic anaphylaxis     h/o recurrent anaphylaxis, unknown trigger, s/p eval at [facility] [phone]), last episode 03/2011  . Arthritis     lumbar and cervical spine  . Smoker   . Diverticulosis 2008    severe by colonoscopy and CT  . Hemangioma     congenital left arm  . DDD (degenerative disc disease)     lumbar (HNP R L3/4) s/p ESI, cervical  . Esophageal stricture 2012    s/p dilation  . Personal history of colonic adenomas 09/02/2013  . Diverticulitis 2015    by CT  . HTN (hypertension)     borderline readings at home  . Carotid stenosis 09/11/2014    40-59% bilat ICA stenosis, rpt 1 yr (09/2014)    Family History  Problem Relation Age of Onset  . Lung cancer Father   . Hyperlipidemia Father   . Coronary artery disease Neg Hx   .  Stroke Neg Hx   . Diabetes Neg Hx   . Arthritis Mother   . Breast cancer Daughter    Past Surgical History  Procedure Laterality Date  . Upper gastrointestinal endoscopy  06/08/2009    dilated esophageal stricture, duodenitis  . Shoulder surgery  1961    due to chronic dislocation  . Tonsillectomy  1944 ?  Marland Kitchen Virtual colonoscopy  2007    [name], rpt rec 5 yrs  . Spirometry  10/2010    severe obstruction, FEV1 41%, ratio 0.46  . Colonoscopy  08/2013    4 tubular adenomas, severe divertic, rpt 3 yrs [name])  . Esophagogastroduodenoscopy (egd) with propofol N/A 10/02/2014    WNL, mild esophagitis; Gatha Mayer, MD  . Balloon dilation N/A 10/02/2014    Procedure: [place];  Surgeon: Gatha Mayer, MD;  Location: [facility];  Service: Endoscopy;  Laterality: N/A;   Short Social History:  History  Substance Use Topics  . Smoking status: Current Every Day Smoker -- 0.50 packs/day for 60 years    Types: Cigarettes  . Smokeless tobacco: Never Used     Comment: would like to quit not ready to. use vapor cig  . Alcohol Use: 7.2 oz/week    12 Cans  of beer per week     Comment: socially    No Known Allergies  Current Outpatient Prescriptions  Medication Sig Dispense Refill  . albuterol (PROVENTIL HFA;VENTOLIN HFA) 108 (90 BASE) MCG/ACT inhaler Inhale 2 puffs into the lungs every 6 (six) hours as needed for wheezing.    Marland Kitchen aspirin 81 MG tablet Take 81 mg by mouth every morning.     . cyclobenzaprine (FLEXERIL) 10 MG tablet Take 10 mg by mouth 2 (two) times daily as needed for muscle spasms.    Marland Kitchen EPINEPHrine (EPIPEN) 0.3 mg/0.3 mL DEVI Inject 0.3 mLs (0.3 mg total) into the muscle once. 2 Device 1  . fluticasone (FLONASE) 50 MCG/ACT nasal spray Place 1 spray into both nostrils 2 (two) times daily.    Marland Kitchen HYDROcodone-acetaminophen (NORCO/VICODIN) 5-325 MG per tablet Take 1 tablet by mouth every 6 (six) hours as needed for moderate pain. 50 tablet 0  . mometasone-formoterol  (DULERA) 200-5 MCG/ACT AERO Inhale 1 puff into the lungs 2 times daily as directed 13 g 3  . montelukast (SINGULAIR) 10 MG tablet Take one every night at bedtime. 30 tablet 11  . pantoprazole (PROTONIX) 40 MG tablet Take 1 tablet (40 mg total) by mouth daily before breakfast. Take 1 tablet by mouth each day 30 minutes before a meal 30 tablet 5  . tiotropium (SPIRIVA HANDIHALER) 18 MCG inhalation capsule Place 1 capsule into inhaler and inhale once daily as directed. 30 capsule 3  . vitamin B-12 (CYANOCOBALAMIN) 500 MCG tablet Take 500 mcg by mouth every morning.      No current facility-administered medications for this visit.    Review of Systems  Constitutional: Negative for chills and fever.  Eyes: Negative for loss of vision.  Respiratory: Positive for cough and wheezing.  Cardiovascular: Positive for claudication and dyspnea with exertion. Negative for chest pain, chest tightness, orthopnea and palpitations.  GI: Negative for blood in stool and vomiting.  GU: Negative for dysuria and hematuria.  Musculoskeletal: Negative for leg pain, joint pain and myalgias.  Skin: Negative for rash and wound.  Neurological: Negative for dizziness and speech difficulty.  Hematologic: Negative for bruises/bleeds easily. Psychiatric: Negative for depressed mood.       Objective:  Objective  Filed Vitals:   10/21/14 0932  BP: 129/73  Pulse: 85  Resp: 18  Height: 5' 8.75" (1.746 m)  Weight: 170 lb 11.2 oz (77.429 kg)   Body mass index is 25.4 kg/(m^2).  Physical Exam  Constitutional: He is oriented to person, place, and time. He appears well-developed and well-nourished.  HENT:  Head: Normocephalic and atraumatic.  Neck: Neck supple. No JVD present. No thyromegaly present.  Cardiovascular: Normal rate, regular rhythm and normal heart sounds.  Exam reveals no friction rub.   No murmur heard. Pulses:      Femoral pulses are 2+ on the right side, and 1+ on the left side.      Popliteal  pulses are 0 on the right side, and 1+ on the left side.       Dorsalis pedis pulses are 0 on the right side, and 0 on the left side.       Posterior tibial pulses are 0 on the right side, and 1+ on the left side.  He has soft bilateral carotid bruits.  Pulmonary/Chest: Breath sounds normal. He has no wheezes. He has no rales.  Abdominal: Soft. Bowel sounds are normal. There is no tenderness.  I do not palpate an aneurysm.  Musculoskeletal:  Normal range of motion. He exhibits no edema.  Lymphadenopathy:    He has no cervical adenopathy.  Neurological: He is alert and oriented to person, place, and time. He has normal strength. No sensory deficit.  Skin: No lesion and no rash noted.  Psychiatric: He has a normal mood and affect.    Data: ARTERIAL DOPPLER STUDY: I have independently interpreted his arterial Doppler study today which shows monophasic Doppler signals in the right dorsalis pedis and posterior tibial positions with an ABI of 72%. Toe pressure on the right is 58 mmHg. On the left side there are biphasic Doppler signals in the posterior tibial and dorsalis pedis positions with an ABI of 98%. Toe pressure on the left is 68 mmHg.  I have reviewed the records from Dr. Bosie Clos office. The patient does have a history of prostate cancer which is being followed by Dr. [name].       Assessment/Plan:     Carotid stenosis The patient has bilateral 40-59% carotid stenoses that are followed by his primary care physician. He is asymptomatic.  Atherosclerosis of native arteries of extremity with intermittent claudication Based on his exam he has evidence of superficial femoral artery occlusive disease on the right. His symptoms are stable and his ABIs are stable. We again discussed the importance of tobacco cessation in detail today. I've encouraged him to stay as active as possible. I bordered follow up ABIs in 1 year and I'll see him back at that time. He knows to call sooner if he has  problems.    Angelia Mould MD [facility]

## 2014-10-21 NOTE — Addendum Note (Signed)
Addended by: Mena Goes on: 10/21/2014 11:06 AM   Modules accepted: Orders

## 2014-12-11 ENCOUNTER — Other Ambulatory Visit: Payer: Self-pay | Admitting: Family Medicine

## 2014-12-18 ENCOUNTER — Other Ambulatory Visit: Payer: Self-pay

## 2014-12-18 NOTE — Telephone Encounter (Signed)
Pt left note requesting rx hydrocodone apap. Call when ready for pick up.pt had annual wellness exam on 09/11/14.

## 2014-12-21 MED ORDER — HYDROCODONE-ACETAMINOPHEN 5-325 MG PO TABS: 1.0000 | ORAL_TABLET | Freq: Four times a day (QID) | ORAL | Status: DC | PRN

## 2014-12-21 NOTE — Telephone Encounter (Signed)
Patient notified and Rx placed up front for pick up. 

## 2014-12-21 NOTE — Telephone Encounter (Signed)
Printed and in [name]'s box 

## 2015-01-01 ENCOUNTER — Other Ambulatory Visit: Payer: Self-pay | Admitting: *Deleted

## 2015-01-01 MED ORDER — ALBUTEROL SULFATE HFA 108 (90 BASE) MCG/ACT IN AERS: 2.0000 | INHALATION_SPRAY | Freq: Four times a day (QID) | RESPIRATORY_TRACT | Status: DC | PRN

## 2015-01-04 HISTORY — PX: INSERTION PROSTATE RADIATION SEED: SUR718

## 2015-01-27 ENCOUNTER — Other Ambulatory Visit: Payer: Self-pay

## 2015-01-27 MED ORDER — PANTOPRAZOLE SODIUM 40 MG PO TBEC: 40.0000 mg | DELAYED_RELEASE_TABLET | Freq: Every day | ORAL | Status: DC

## 2015-02-11 ENCOUNTER — Other Ambulatory Visit: Payer: Self-pay | Admitting: Family Medicine

## 2015-03-15 ENCOUNTER — Encounter: Payer: Self-pay | Admitting: *Deleted

## 2015-03-15 ENCOUNTER — Ambulatory Visit (INDEPENDENT_AMBULATORY_CARE_PROVIDER_SITE_OTHER): Payer: Medicare Other | Admitting: Family Medicine

## 2015-03-15 ENCOUNTER — Encounter: Payer: Self-pay | Admitting: Family Medicine

## 2015-03-15 VITALS — BP 148/80 | HR 96 | Temp 97.0°F | Ht 69.0 in | Wt 166.0 lb

## 2015-03-15 DIAGNOSIS — M5136 Other intervertebral disc degeneration, lumbar region: Secondary | ICD-10-CM

## 2015-03-15 DIAGNOSIS — J449 Chronic obstructive pulmonary disease, unspecified: Secondary | ICD-10-CM

## 2015-03-15 DIAGNOSIS — C61 Malignant neoplasm of prostate: Secondary | ICD-10-CM

## 2015-03-15 DIAGNOSIS — F17200 Nicotine dependence, unspecified, uncomplicated: Secondary | ICD-10-CM

## 2015-03-15 DIAGNOSIS — I70219 Atherosclerosis of native arteries of extremities with intermittent claudication, unspecified extremity: Secondary | ICD-10-CM | POA: Diagnosis not present

## 2015-03-15 DIAGNOSIS — E785 Hyperlipidemia, unspecified: Secondary | ICD-10-CM

## 2015-03-15 DIAGNOSIS — Z720 Tobacco use: Secondary | ICD-10-CM | POA: Diagnosis not present

## 2015-03-15 DIAGNOSIS — I10 Essential (primary) hypertension: Secondary | ICD-10-CM

## 2015-03-15 MED ORDER — EPINEPHRINE 0.3 MG/0.3ML IJ SOAJ: 0.3000 mg | Freq: Once | INTRAMUSCULAR | Status: DC

## 2015-03-15 MED ORDER — HYDROCODONE-ACETAMINOPHEN 5-325 MG PO TABS: 1.0000 | ORAL_TABLET | Freq: Four times a day (QID) | ORAL | Status: DC | PRN

## 2015-03-15 NOTE — Assessment & Plan Note (Signed)
Discuss statin next visit if chol levels remain uncontrolled.

## 2015-03-15 NOTE — Assessment & Plan Note (Addendum)
Continue to encourage cessation. precontemplative today. Declines assistance.

## 2015-03-15 NOTE — Assessment & Plan Note (Signed)
Hydrocodone refilled. Infrequent use. UDS today and update controlled substance agreement. Discussed alcohol use.

## 2015-03-15 NOTE — Progress Notes (Signed)
Pre visit review using our clinic review tool, if applicable. No additional management support is needed unless otherwise documented below in the visit note. 

## 2015-03-15 NOTE — Assessment & Plan Note (Signed)
Triple therapy. Coarse and wheezy today. Continue to encourage smoking cessation. Not interested today.

## 2015-03-15 NOTE — Assessment & Plan Note (Signed)
Appreciate onc/uro care of patient.

## 2015-03-15 NOTE — Patient Instructions (Addendum)
meds refilled. UDS today. Continue thinking about quitting smoking - best thing to do for your health. Return in 6 months for medicare wellness visit.

## 2015-03-15 NOTE — Progress Notes (Signed)
BP 148/80 mmHg  Pulse 96  Temp(Src) 97 F (36.1 C) (Oral)  Ht 5\' 9"  (1.753 m)  Wt 166 lb (75.297 kg)  BMI 24.50 kg/m2   CC: 45mo f/u visit  Subjective:    Patient ID: Parker Martinez, male    DOB: [birth year], 74 y.o.   MRN: [id number]  HPI: Parker Martinez is a 73 y.o. male presenting on 03/15/2015 for Follow-up   Severe COPD - uses albuterol a few times a week. spiriva nightly and dulera 1 puff bid. Marked dyspnea several times a day. Keeps productive cough with significant sinus drainage.   Continued smoker - 1 ppd. Intermittently contemplative. Not currently interested in smoking cessation  Idiopathic anaphylaxis - ?pancakes or pizza related.   Prostate discomfort after prostate biopsy. Cancer followed by oncologist and Dr [name].   Lumbar DDD with L radiculopathy - on hydrocodone. Requests refill. Due for UDS.  Alcohol use - 0 to 3-4 beer per sitting. Denies trouble with this.   Tested negative for hydrocodone and positive for alcohol on UDS 10/2013. Recommend Q6 mo testing and will need to discuss EtOH use at next visit.   Known PAD and carotid stenosis.  Lab Results  Component Value Date   LDLCALC 130* 09/08/2014    Relevant past medical, surgical, family and social history reviewed and updated as indicated. Interim medical history since our last visit reviewed. Allergies and medications reviewed and updated. Current Outpatient Prescriptions on File Prior to Visit  Medication Sig  . albuterol (PROVENTIL HFA;VENTOLIN HFA) 108 (90 BASE) MCG/ACT inhaler Inhale 2 puffs into the lungs every 6 (six) hours as needed for wheezing.  Marland Kitchen aspirin 81 MG tablet Take 81 mg by mouth every morning.   . cyclobenzaprine (FLEXERIL) 10 MG tablet Take 10 mg by mouth 2 (two) times daily as needed for muscle spasms.  . mometasone-formoterol (DULERA) 200-5 MCG/ACT AERO Inhale 1 puff into the lungs 2 times daily as directed  . montelukast (SINGULAIR) 10 MG tablet Take one every night at  bedtime.  . pantoprazole (PROTONIX) 40 MG tablet Take 1 tablet (40 mg total) by mouth daily before breakfast. Take 1 tablet by mouth each day 30 minutes before a meal  . SPIRIVA HANDIHALER 18 MCG inhalation capsule PLACE 1 CAPSULE INTO INHALER AND INHALE ONCE DAILY AS DIRECTED  . vitamin B-12 (CYANOCOBALAMIN) 500 MCG tablet Take 500 mcg by mouth every morning.   . fluticasone (FLONASE) 50 MCG/ACT nasal spray Place 1 spray into both nostrils 2 (two) times daily.  Marland Kitchen PROVENTIL HFA 108 (90 BASE) MCG/ACT inhaler INHALE 2 PUFFS INTO THE LUNGS EVERY 6 HOURS AS NEEDED FOR WHEEZING   No current facility-administered medications on file prior to visit.   Past Medical History  Diagnosis Date  . Allergic rhinitis   . Status post dilatation of esophageal stricture   . Duodenitis   . COPD (chronic obstructive pulmonary disease)     severe, participated in [place] study  . Prostate cancer 2011    prostate seed implant (Dr. [name])  . GERD (gastroesophageal reflux disease)   . History of kidney stones remote  . PAD (peripheral artery disease)   . Idiopathic anaphylaxis     h/o recurrent anaphylaxis, unknown trigger, s/p eval at [facility] [phone]), last episode 03/2011  . Arthritis     lumbar and cervical spine  . Smoker   . Diverticulosis 2008    severe by colonoscopy and CT  . Hemangioma  congenital left arm  . DDD (degenerative disc disease)     lumbar (HNP R L3/4) s/p ESI, cervical  . Esophageal stricture 2012    s/p dilation  . Personal history of colonic adenomas 09/02/2013  . Diverticulitis 2015    by CT  . HTN (hypertension)     borderline readings at home  . Carotid stenosis 09/11/2014    40-59% bilat ICA stenosis, rpt 1 yr (09/2014)     Review of Systems Per HPI unless specifically indicated above     Objective:    BP 148/80 mmHg  Pulse 96  Temp(Src) 97 F (36.1 C) (Oral)  Ht 5\' 9"  (1.753 m)  Wt 166 lb (75.297 kg)  BMI 24.50 kg/m2  Wt Readings from Last 3  Encounters:  03/15/15 166 lb (75.297 kg)  10/21/14 170 lb 11.2 oz (77.429 kg)  09/18/14 172 lb (78.019 kg)    Physical Exam  Constitutional: He appears well-developed and well-nourished. No distress.  HENT:  Mouth/Throat: Oropharynx is clear and moist. No oropharyngeal exudate.  Eyes: Conjunctivae and EOM are normal. Pupils are equal, round, and reactive to light. No scleral icterus.  Neck: No thyromegaly present.  Cardiovascular: Normal rate, regular rhythm, normal heart sounds and intact distal pulses.   No murmur heard. Pulmonary/Chest: Effort normal. No respiratory distress. He has decreased breath sounds. He has wheezes. He has no rhonchi. He has no rales.  Coarse throughout  Musculoskeletal: He exhibits no edema.  Skin: Skin is warm and dry. No rash noted.  Psychiatric: He has a normal mood and affect.  Nursing note and vitals reviewed.     Assessment & Plan:   Problem List Items Addressed This Visit    Prostate cancer    Appreciate onc/uro care of patient.      Relevant Medications   HYDROcodone-acetaminophen (NORCO/VICODIN) 5-325 MG per tablet   HTN (hypertension)    Chronic, mildly elevated today. Did not start antihypertensive today.  BP Readings from Last 3 Encounters:  03/15/15 148/80  10/21/14 129/73  10/02/14 156/80        Relevant Medications   EPINEPHrine 0.3 mg/0.3 mL IJ SOAJ injection   Dyslipidemia    Discuss statin next visit if chol levels remain uncontrolled.      DDD (degenerative disc disease), lumbar    Hydrocodone refilled. Infrequent use. UDS today and update controlled substance agreement. Discussed alcohol use.      Relevant Medications   HYDROcodone-acetaminophen (NORCO/VICODIN) 5-325 MG per tablet   Current smoker - Primary    Continue to encourage cessation. precontemplative today. Declines assistance.      COPD (chronic obstructive pulmonary disease)    Triple therapy. Coarse and wheezy today. Continue to encourage smoking  cessation. Not interested today.      Atherosclerosis of native arteries of extremity with intermittent claudication    Known PAD. Will need to discuss statin      Relevant Medications   EPINEPHrine 0.3 mg/0.3 mL IJ SOAJ injection       Follow up plan: Return in about 6 months (around 09/14/2015), or return as needed, for medicare wellness.

## 2015-03-15 NOTE — Assessment & Plan Note (Signed)
Chronic, mildly elevated today. Did not start antihypertensive today.  BP Readings from Last 3 Encounters:  03/15/15 148/80  10/21/14 129/73  10/02/14 156/80

## 2015-03-15 NOTE — Assessment & Plan Note (Addendum)
Known PAD. Will need to discuss statin

## 2015-04-02 ENCOUNTER — Other Ambulatory Visit: Payer: Self-pay | Admitting: Family Medicine

## 2015-04-11 ENCOUNTER — Encounter: Payer: Self-pay | Admitting: Family Medicine

## 2015-04-11 DIAGNOSIS — R892 Abnormal level of other drugs, medicaments and biological substances in specimens from other organs, systems and tissues: Secondary | ICD-10-CM | POA: Insufficient documentation

## 2015-04-30 ENCOUNTER — Encounter: Payer: Self-pay | Admitting: Family Medicine

## 2015-06-22 ENCOUNTER — Other Ambulatory Visit: Payer: Self-pay | Admitting: Family Medicine

## 2015-07-14 ENCOUNTER — Other Ambulatory Visit: Payer: Self-pay

## 2015-07-14 NOTE — Telephone Encounter (Signed)
Pt left note requesting rx hydrocodone apap. Call when ready for pick up. Pt last seen and rx last printed # 50 on 03/15/15.Please advise.

## 2015-07-15 MED ORDER — HYDROCODONE-ACETAMINOPHEN 5-325 MG PO TABS: 1.0000 | ORAL_TABLET | Freq: Four times a day (QID) | ORAL | Status: DC | PRN

## 2015-07-15 NOTE — Telephone Encounter (Signed)
Printed and in [name]'s box 

## 2015-07-15 NOTE — Telephone Encounter (Signed)
Patient came to pick up Rx before I could call him. Rx given to [place] for patient.

## 2015-08-10 ENCOUNTER — Other Ambulatory Visit: Payer: Self-pay

## 2015-08-10 MED ORDER — PANTOPRAZOLE SODIUM 40 MG PO TBEC: 40.0000 mg | DELAYED_RELEASE_TABLET | Freq: Every day | ORAL | Status: DC

## 2015-08-27 ENCOUNTER — Other Ambulatory Visit: Payer: Self-pay | Admitting: Family Medicine

## 2015-09-12 ENCOUNTER — Other Ambulatory Visit: Payer: Self-pay | Admitting: Family Medicine

## 2015-09-12 DIAGNOSIS — I10 Essential (primary) hypertension: Secondary | ICD-10-CM

## 2015-09-12 DIAGNOSIS — E559 Vitamin D deficiency, unspecified: Secondary | ICD-10-CM

## 2015-09-12 DIAGNOSIS — C61 Malignant neoplasm of prostate: Secondary | ICD-10-CM

## 2015-09-12 DIAGNOSIS — E538 Deficiency of other specified B group vitamins: Secondary | ICD-10-CM

## 2015-09-12 DIAGNOSIS — D751 Secondary polycythemia: Secondary | ICD-10-CM

## 2015-09-12 DIAGNOSIS — E785 Hyperlipidemia, unspecified: Secondary | ICD-10-CM

## 2015-09-13 ENCOUNTER — Other Ambulatory Visit: Payer: Medicare Other

## 2015-09-16 ENCOUNTER — Ambulatory Visit (INDEPENDENT_AMBULATORY_CARE_PROVIDER_SITE_OTHER): Payer: Medicare Other | Admitting: Family Medicine

## 2015-09-16 ENCOUNTER — Encounter: Payer: Self-pay | Admitting: Family Medicine

## 2015-09-16 VITALS — BP 130/76 | HR 88 | Temp 97.4°F | Ht 69.0 in | Wt 169.8 lb

## 2015-09-16 DIAGNOSIS — D751 Secondary polycythemia: Secondary | ICD-10-CM

## 2015-09-16 DIAGNOSIS — R7989 Other specified abnormal findings of blood chemistry: Secondary | ICD-10-CM

## 2015-09-16 DIAGNOSIS — C61 Malignant neoplasm of prostate: Secondary | ICD-10-CM

## 2015-09-16 DIAGNOSIS — E538 Deficiency of other specified B group vitamins: Secondary | ICD-10-CM | POA: Diagnosis not present

## 2015-09-16 DIAGNOSIS — Z720 Tobacco use: Secondary | ICD-10-CM

## 2015-09-16 DIAGNOSIS — E785 Hyperlipidemia, unspecified: Secondary | ICD-10-CM

## 2015-09-16 DIAGNOSIS — F17200 Nicotine dependence, unspecified, uncomplicated: Secondary | ICD-10-CM

## 2015-09-16 DIAGNOSIS — I10 Essential (primary) hypertension: Secondary | ICD-10-CM

## 2015-09-16 DIAGNOSIS — J449 Chronic obstructive pulmonary disease, unspecified: Secondary | ICD-10-CM

## 2015-09-16 DIAGNOSIS — Z0000 Encounter for general adult medical examination without abnormal findings: Secondary | ICD-10-CM | POA: Diagnosis not present

## 2015-09-16 DIAGNOSIS — E559 Vitamin D deficiency, unspecified: Secondary | ICD-10-CM

## 2015-09-16 DIAGNOSIS — I70213 Atherosclerosis of native arteries of extremities with intermittent claudication, bilateral legs: Secondary | ICD-10-CM

## 2015-09-16 DIAGNOSIS — Z7189 Other specified counseling: Secondary | ICD-10-CM | POA: Diagnosis not present

## 2015-09-16 DIAGNOSIS — I739 Peripheral vascular disease, unspecified: Secondary | ICD-10-CM

## 2015-09-16 DIAGNOSIS — I6523 Occlusion and stenosis of bilateral carotid arteries: Secondary | ICD-10-CM

## 2015-09-16 DIAGNOSIS — K219 Gastro-esophageal reflux disease without esophagitis: Secondary | ICD-10-CM

## 2015-09-16 DIAGNOSIS — T782XXS Anaphylactic shock, unspecified, sequela: Secondary | ICD-10-CM

## 2015-09-16 LAB — LIPID PANEL
Cholesterol: 197 mg/dL (ref 0–200)
HDL: 66.6 mg/dL (ref >39.00)
LDL Cholesterol: 93 mg/dL (ref 0–99)
NonHDL: 130
Total CHOL/HDL Ratio: 3
Triglycerides: 186 mg/dL — ABNORMAL HIGH (ref 0.0–149.0)
VLDL: 37.2 mg/dL (ref 0.0–40.0)

## 2015-09-16 LAB — HEPATIC FUNCTION PANEL
ALT: 15 U/L (ref 0–53)
AST: 17 U/L (ref 0–37)
Albumin: 4.4 g/dL (ref 3.5–5.2)
Alkaline Phosphatase: 68 U/L (ref 39–117)
Bilirubin, Direct: 0.1 mg/dL (ref 0.0–0.3)
Total Bilirubin: 0.6 mg/dL (ref 0.2–1.2)
Total Protein: 7.1 g/dL (ref 6.0–8.3)

## 2015-09-16 LAB — BASIC METABOLIC PANEL
BUN: 9 mg/dL (ref 6–23)
CO2: 31 mEq/L (ref 19–32)
Calcium: 10.1 mg/dL (ref 8.4–10.5)
Chloride: 102 mEq/L (ref 96–112)
Creatinine, Ser: 0.77 mg/dL (ref 0.40–1.50)
GFR: 104.6 mL/min (ref >60.00)
Glucose, Bld: 94 mg/dL (ref 70–99)
Potassium: 4.3 mEq/L (ref 3.5–5.1)
Sodium: 141 mEq/L (ref 135–145)

## 2015-09-16 LAB — CBC WITH DIFFERENTIAL/PLATELET
Basophils Absolute: 0 10*3/uL (ref 0.0–0.1)
Basophils Relative: 0.5 % (ref 0.0–3.0)
Eosinophils Absolute: 0.1 10*3/uL (ref 0.0–0.7)
Eosinophils Relative: 1.6 % (ref 0.0–5.0)
HCT: 48.2 % (ref 39.0–52.0)
Hemoglobin: 16 g/dL (ref 13.0–17.0)
Lymphocytes Relative: 25.2 % (ref 12.0–46.0)
Lymphs Abs: 1.9 10*3/uL (ref 0.7–4.0)
MCHC: 33.2 g/dL (ref 30.0–36.0)
MCV: 97.8 fl (ref 78.0–100.0)
Monocytes Absolute: 0.8 10*3/uL (ref 0.1–1.0)
Monocytes Relative: 10.7 % (ref 3.0–12.0)
Neutro Abs: 4.6 10*3/uL (ref 1.4–7.7)
Neutrophils Relative %: 62 % (ref 43.0–77.0)
Platelets: 233 10*3/uL (ref 150.0–400.0)
RBC: 4.93 Mil/uL (ref 4.22–5.81)
RDW: 12.9 % (ref 11.5–15.5)
WBC: 7.4 10*3/uL (ref 4.0–10.5)

## 2015-09-16 LAB — VITAMIN D 25 HYDROXY (VIT D DEFICIENCY, FRACTURES): VITD: 41.35 ng/mL (ref 30.00–100.00)

## 2015-09-16 LAB — VITAMIN B12: Vitamin B-12: 835 pg/mL (ref 211–911)

## 2015-09-16 NOTE — Assessment & Plan Note (Signed)
Chronic, stable off meds.  

## 2015-09-16 NOTE — Addendum Note (Signed)
Addended by: Daralene Milch C on: 09/16/2015 04:00 PM   Modules accepted: SmartSet

## 2015-09-16 NOTE — Addendum Note (Signed)
Addended by: Ria Bush on: 09/16/2015 11:46 AM   Modules accepted: Miquel Dunn

## 2015-09-16 NOTE — Assessment & Plan Note (Signed)

## 2015-09-16 NOTE — Progress Notes (Signed)
Pre visit review using our clinic review tool, if applicable. No additional management support is needed unless otherwise documented below in the visit note. 

## 2015-09-16 NOTE — Addendum Note (Signed)
Addended by: Ria Bush on: 09/16/2015 12:53 PM   Modules accepted: Miquel Dunn

## 2015-09-16 NOTE — Assessment & Plan Note (Signed)
Recheck today. Anticipate secondary due to smoking.

## 2015-09-16 NOTE — Assessment & Plan Note (Signed)
DEXA ordered.  

## 2015-09-16 NOTE — Assessment & Plan Note (Signed)
Check FLP today. Off med.

## 2015-09-16 NOTE — Addendum Note (Signed)
Addended by: Ellamae Sia on: 09/16/2015 03:07 PM   Modules accepted: Orders

## 2015-09-16 NOTE — Assessment & Plan Note (Signed)
Continue PPI/zantac. H/o esoph rings s/p dilation last yaer

## 2015-09-16 NOTE — Progress Notes (Addendum)
BP 130/76 mmHg  Pulse 88  Temp(Src) 97.4 F (36.3 C) (Oral)  Ht '5\' 9"'$  (1.753 m)  Wt 169 lb 12 oz (76.998 kg)  BMI 25.06 kg/m2   CC: medicare wellness visit  Subjective:    Patient ID: Parker Martinez, male    DOB: 07/30/1941, 74 y.o.   MRN: [id number]  HPI: Parker Martinez is a 74 y.o. male presenting on 09/16/2015 for Annual Exam   Known severe COPD not oxygen dependent - on spiriva and dulera. Continued 1/2-1ppd smoker Tested negative for hydrocodone and positive for alcohol on UDS 10/2013 and again 03/2015. Hydrocodone #50 ~Q3 mo. Endorses regular alcohol use. Does not mix narcotic with alcohol.   Vision screen - recent appt with ophtho.  Hearing - persistent trouble at high frequencies. 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 Discussed ear protection.  No falls in last year Denies depression, anhedonia, dealing with daughter's illness.  Preventative: COLONOSCOPY Date: 08/2013 4 tubular adenomas, severe divertic, rpt 3 yrs [name]).  Prostate cancer - prostate seeds started 2016 (Dr. [name] at [facility]). Normal recent CT scan.  Lung cancer screening - completed at [facility] Flu shot - declines Tetanus - unsure Pneumonia 2011, prevnar 2015 Shingles shot - will check with insurance. Advanced directives: in chart 09/2014. Wife Judson Roch is Economist. No extraordinary efforts if terminal condition - does not want prolonged life support or feeding tube. Seat belt use discussed Sunscreen use discussed. No changing moles on skin.  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 Interim medical history since our last visit reviewed. Allergies and medications reviewed and updated. Current Outpatient Prescriptions on File Prior  to Visit  Medication Sig  . albuterol (PROVENTIL HFA;VENTOLIN HFA) 108 (90 BASE) MCG/ACT inhaler Inhale 2 puffs into the lungs every 6 (six) hours as needed for wheezing.  Marland Kitchen aspirin 81 MG tablet Take 81 mg by mouth every morning.   . cyclobenzaprine (FLEXERIL) 10 MG tablet Take 10 mg by mouth 2 (two) times daily as needed for muscle spasms.  . DULERA 200-5 MCG/ACT AERO INHALE 1 PUFF 2 TIMES DAILY AS NEEDED  . EPINEPHrine 0.3 mg/0.3 mL IJ SOAJ injection Inject 0.3 mLs (0.3 mg total) into the muscle once.  . fluticasone (FLONASE) 50 MCG/ACT nasal spray Place 1 spray into both nostrils 2 (two) times daily.  Marland Kitchen HYDROcodone-acetaminophen (NORCO/VICODIN) 5-325 MG per tablet Take 1 tablet by mouth every 6 (six) hours as needed for moderate pain. (Patient taking differently: Take 1 tablet by mouth every 4 (four) hours as needed for moderate pain. )  . montelukast (SINGULAIR) 10 MG tablet TAKE ONE TABLET BY MOUTH EVERY NIGHT AT BEDTIME  . pantoprazole (PROTONIX) 40 MG tablet Take 1 tablet (40 mg total) by mouth daily before breakfast. Take 1 tablet by mouth each day 30 minutes before a meal  . SPIRIVA HANDIHALER 18 MCG inhalation capsule PLACE 1 CAPSULE INTO INHALER AND INHALE ONCE DAILY AS DIRECTED  . vitamin B-12 (CYANOCOBALAMIN) 500 MCG tablet Take 500 mcg by mouth every morning.    No current facility-administered medications on file prior to visit.    Review of Systems Per HPI unless specifically indicated above     Objective:    BP 130/76 mmHg  Pulse 88  Temp(Src) 97.4 F (36.3 C) (Oral)  Ht  $'5\' 9"'h$  (1.753 m)  Wt 169 lb 12 oz (76.998 kg)  BMI 25.06 kg/m2  Wt Readings from Last 3 Encounters:  09/16/15 169 lb 12 oz (76.998 kg)  03/15/15 166 lb (75.297 kg)  10/21/14 170 lb 11.2 oz (77.429 kg)    Physical Exam  Constitutional: He is oriented to person, place, and time. He appears well-developed and well-nourished. No distress.  HENT:  Head: Normocephalic and atraumatic.  Right Ear:  Hearing, tympanic membrane, external ear and ear canal normal.  Left Ear: Hearing, tympanic membrane, external ear and ear canal normal.  Nose: Nose normal.  Mouth/Throat: Uvula is midline, oropharynx is clear and moist and mucous membranes are normal. No oropharyngeal exudate, posterior oropharyngeal edema or posterior oropharyngeal erythema.  Eyes: Conjunctivae and EOM are normal. Pupils are equal, round, and reactive to light. No scleral icterus.  Neck: Normal range of motion. Neck supple. Carotid bruit is present (bilateral). No thyromegaly present.  Cardiovascular: Normal rate, regular rhythm, normal heart sounds and intact distal pulses.   No murmur heard. Pulses:      Radial pulses are 2+ on the right side, and 2+ on the left side.  Pulmonary/Chest: Effort normal and breath sounds normal. No respiratory distress. He has no wheezes. He has no rales.  Abdominal: Soft. Bowel sounds are normal. He exhibits no distension and no mass. There is no tenderness. There is no rebound and no guarding.  Genitourinary:  DRE - deferred  Musculoskeletal: Normal range of motion. He exhibits no edema.  Lymphadenopathy:    He has no cervical adenopathy.  Neurological: He is alert and oriented to person, place, and time.  CN grossly intact, station and gait intact Recall 3/3  3/5 calculation serial 7s  Skin: Skin is warm and dry. No rash noted.  Psychiatric: He has a normal mood and affect. His behavior is normal. Judgment and thought content normal.  Nursing note and vitals reviewed.  Results for orders placed or performed in visit on 09/08/14  PSA, Medicare  Result Value Ref Range   PSA 3.16 0.10 - 4.00 ng/ml  Lipid panel  Result Value Ref Range   Cholesterol 221 (H) 0 - 200 mg/dL   Triglycerides 163.0 (H) 0.0 - 149.0 mg/dL   HDL 58.70 >39.00 mg/dL   VLDL 32.6 0.0 - 40.0 mg/dL   LDL Cholesterol 130 (H) 0 - 99 mg/dL   Total CHOL/HDL Ratio 4    NonHDL 741.28   Basic metabolic panel  Result  Value Ref Range   Sodium 142 135 - 145 mEq/L   Potassium 4.6 3.5 - 5.1 mEq/L   Chloride 106 96 - 112 mEq/L   CO2 27 19 - 32 mEq/L   Glucose, Bld 109 (H) 70 - 99 mg/dL   BUN 13 6 - 23 mg/dL   Creatinine, Ser 0.8 0.4 - 1.5 mg/dL   Calcium 9.7 8.4 - 10.5 mg/dL   GFR 98.93 >60.00 mL/min  Vitamin B12  Result Value Ref Range   Vitamin B-12 258 211 - 911 pg/mL  Vit D  25 hydroxy (rtn osteoporosis monitoring)  Result Value Ref Range   VITD 41.53 30.00 - 100.00 ng/mL  CBC with Differential  Result Value Ref Range   WBC 8.3 4.0 - 10.5 K/uL   RBC 4.88 4.22 - 5.81 Mil/uL   Hemoglobin 15.9 13.0 - 17.0 g/dL   HCT 47.1 39.0 - 52.0 %   MCV 96.4 78.0 - 100.0 fl   MCHC 33.7 30.0 - 36.0 g/dL  RDW 12.9 11.5 - 15.5 %   Platelets 257.0 150.0 - 400.0 K/uL   Neutrophils Relative % 58.6 43.0 - 77.0 %   Lymphocytes Relative 28.2 12.0 - 46.0 %   Monocytes Relative 10.7 3.0 - 12.0 %   Eosinophils Relative 2.3 0.0 - 5.0 %   Basophils Relative 0.2 0.0 - 3.0 %   Neutro Abs 4.9 1.4 - 7.7 K/uL   Lymphs Abs 2.3 0.7 - 4.0 K/uL   Monocytes Absolute 0.9 0.1 - 1.0 K/uL   Eosinophils Absolute 0.2 0.0 - 0.7 K/uL   Basophils Absolute 0.0 0.0 - 0.1 K/uL      Assessment & Plan:   Problem List Items Addressed This Visit    Prostate cancer [facility])    Seed implant with Dr [name] at [facility]. PSA checked there.      Relevant Medications   fexofenadine (ALLEGRA) 180 MG tablet   Polycythemia    Recheck today. Anticipate secondary due to smoking.       PAD (peripheral artery disease) ([place])    Followed by [facility].       Medicare annual wellness visit, subsequent - Primary    I have personally reviewed the Medicare Annual Wellness questionnaire and have noted 1. The patient's medical and social history 2. Their use of alcohol, tobacco or illicit drugs 3. Their current medications and supplements 4. The patient's functional ability including ADL's, fall risks, home safety risks and hearing or visual impairment.  Cognitive function has been assessed and addressed as indicated.  5. Diet and physical activity 6. Evidence for depression or mood disorders The patients weight, height, BMI have been recorded in the chart. I have made referrals, counseling and provided education to the patient based on review of the above and I have provided the pt with a written personalized care plan for preventive services. Provider list updated.. See scanned questionairre as needed for further documentation. Reviewed preventative protocols and updated unless pt declined.       Idiopathic anaphylaxis    Unknown trigger. Epi pen at home      HTN (hypertension)    Chronic, stable off meds.      Healthcare maintenance    Preventative protocols reviewed and updated unless pt declined. Discussed healthy diet and lifestyle.       GERD (gastroesophageal reflux disease)    Continue PPI/zantac. 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 Off med.      Relevant Orders   Hepatic function panel   Current smoker    Continue to encourage cessation. Contemplative.  Previously underwent lung cancer screening at [facility].      COPD (chronic obstructive pulmonary disease) ([place])    Stable period - continue to monitor.      Relevant Medications   fexofenadine (ALLEGRA) 180 MG tablet   Carotid stenosis    DEXA ordered.      Relevant Orders   Carotid   Atherosclerosis of native arteries of extremity with intermittent claudication ([place])    F/u with [facility].      Advanced care planning/counseling discussion    Advanced directives: in chart 09/2014. Wife Judson Roch is Economist. No extraordinary efforts if terminal condition - does not want prolonged life support or feeding tube.       Other Visit Diagnoses    Peripheral vascular disease, unspecified ([place])  Follow up plan: Return in about 6 months (around  03/16/2016), or as needed, for follow up visit.

## 2015-09-16 NOTE — Assessment & Plan Note (Signed)
Preventative protocols reviewed and updated unless pt declined. Discussed healthy diet and lifestyle.  

## 2015-09-16 NOTE — Assessment & Plan Note (Signed)
Unknown trigger. Epi pen at home

## 2015-09-16 NOTE — Assessment & Plan Note (Signed)
F/u with [facility].

## 2015-09-16 NOTE — Patient Instructions (Addendum)
Call your insurance about the shingles shot to see if it is covered or how much it would cost and where is cheaper (here or pharmacy).  If you want to receive here, call for nurse visit. Schedule appointment with hearing doctor. We will schedule you for carotid ultrasound. labwork today. Return as needed or in 6 months for f/u visit.  Health Maintenance, Male A healthy lifestyle and preventative care can promote health and wellness.  Maintain regular health, dental, and eye exams.  Eat a healthy diet. Foods like vegetables, fruits, whole grains, low-fat dairy products, and lean protein foods contain the nutrients you need and are low in calories. Decrease your intake of foods high in solid fats, added sugars, and salt. Get information about a proper diet from your health care provider, if necessary.  Regular physical exercise is one of the most important things you can do for your health. Most adults should get at least 150 minutes of moderate-intensity exercise (any activity that increases your heart rate and causes you to sweat) each week. In addition, most adults need muscle-strengthening exercises on 2 or more days a week.   Maintain a healthy weight. The body mass index (BMI) is a screening tool to identify possible weight problems. It provides an estimate of body fat based on height and weight. Your health care provider can find your BMI and can help you achieve or maintain a healthy weight. For males 20 years and older:  A BMI below 18.5 is considered underweight.  A BMI of 18.5 to 24.9 is normal.  A BMI of 25 to 29.9 is considered overweight.  A BMI of 30 and above is considered obese.  Maintain normal blood lipids and cholesterol by exercising and minimizing your intake of saturated fat. Eat a balanced diet with plenty of fruits and vegetables. Blood tests for lipids and cholesterol should begin at age 45 and be repeated every 5 years. If your lipid or cholesterol levels are high,  you are over age 13, or you are at high risk for heart disease, you may need your cholesterol levels checked more frequently.Ongoing high lipid and cholesterol levels should be treated with medicines if diet and exercise are not working.  If you smoke, find out from your health care provider how to quit. If you do not use tobacco, do not start.  Lung cancer screening is recommended for adults aged 51-80 years who are at high risk for developing lung cancer because of a history of smoking. A yearly low-dose CT scan of the lungs is recommended for people who have at least a 30-pack-year history of smoking and are current smokers or have quit within the past 15 years. A pack year of smoking is smoking an average of 1 pack of cigarettes a day for 1 year (for example, a 30-pack-year history of smoking could mean smoking 1 pack a day for 30 years or 2 packs a day for 15 years). Yearly screening should continue until the smoker has stopped smoking for at least 15 years. Yearly screening should be stopped for people who develop a health problem that would prevent them from having lung cancer treatment.  If you choose to drink alcohol, do not have more than 2 drinks per day. One drink is considered to be 12 oz (360 mL) of beer, 5 oz (150 mL) of wine, or 1.5 oz (45 mL) of liquor.  Avoid the use of street drugs. Do not share needles with anyone. Ask for help if you  need support or instructions about stopping the use of drugs.  High blood pressure causes heart disease and increases the risk of stroke. High blood pressure is more likely to develop in:  People who have blood pressure in the end of the normal range (100-139/85-89 mm Hg).  People who are overweight or obese.  People who are African American.  If you are 55-63 years of age, have your blood pressure checked every 3-5 years. If you are 74 years of age or older, have your blood pressure checked every year. You should have your blood pressure measured  twice--once when you are at a hospital or clinic, and once when you are not at a hospital or clinic. Record the average of the two measurements. To check your blood pressure when you are not at a hospital or clinic, you can use:  An automated blood pressure machine at a pharmacy.  A home blood pressure monitor.  If you are 36-29 years old, ask your health care provider if you should take aspirin to prevent heart disease.  Diabetes screening involves taking a blood sample to check your fasting blood sugar level. This should be done once every 3 years after age 78 if you are at a normal weight and without risk factors for diabetes. Testing should be considered at a younger age or be carried out more frequently if you are overweight and have at least 1 risk factor for diabetes.  Colorectal cancer can be detected and often prevented. Most routine colorectal cancer screening begins at the age of 10 and continues through age 74. However, your health care provider may recommend screening at an earlier age if you have risk factors for colon cancer. On a yearly basis, your health care provider may provide home test kits to check for hidden blood in the stool. A small camera at the end of a tube may be used to directly examine the colon (sigmoidoscopy or colonoscopy) to detect the earliest forms of colorectal cancer. Talk to your health care provider about this at age 67 when routine screening begins. A direct exam of the colon should be repeated every 5-10 years through age 54, unless early forms of precancerous polyps or small growths are found.  People who are at an increased risk for hepatitis B should be screened for this virus. You are considered at high risk for hepatitis B if:  You were born in a country where hepatitis B occurs often. Talk with your health care provider about which countries are considered high risk.  Your parents were born in a high-risk country and you have not received a shot to  protect against hepatitis B (hepatitis B vaccine).  You have HIV or AIDS.  You use needles to inject street drugs.  You live with, or have sex with, someone who has hepatitis B.  You are a man who has sex with other men (MSM).  You get hemodialysis treatment.  You take certain medicines for conditions like cancer, organ transplantation, and autoimmune conditions.  Hepatitis C blood testing is recommended for all people born from [birth year] through 1966 and any individual with known risk factors for hepatitis C.  Healthy men should no longer receive prostate-specific antigen (PSA) blood tests as part of routine cancer screening. Talk to your health care provider about prostate cancer screening.  Testicular cancer screening is not recommended for adolescents or adult males who have no symptoms. Screening includes self-exam, a health care provider exam, and other screening tests. Consult with  your health care provider about any symptoms you have or any concerns you have about testicular cancer.  Practice safe sex. Use condoms and avoid high-risk sexual practices to reduce the spread of sexually transmitted infections (STIs).  You should be screened for STIs, including gonorrhea and chlamydia if:  You are sexually active and are younger than 24 years.  You are older than 24 years, and your health care provider tells you that you are at risk for this type of infection.  Your sexual activity has changed since you were last screened, and you are at an increased risk for chlamydia or gonorrhea. Ask your health care provider if you are at risk.  If you are at risk of being infected with HIV, it is recommended that you take a prescription medicine daily to prevent HIV infection. This is called pre-exposure prophylaxis (PrEP). You are considered at risk if:  You are a man who has sex with other men (MSM).  You are a heterosexual man who is sexually active with multiple partners.  You take drugs by  injection.  You are sexually active with a partner who has HIV.  Talk with your health care provider about whether you are at high risk of being infected with HIV. If you choose to begin PrEP, you should first be tested for HIV. You should then be tested every 3 months for as long as you are taking PrEP.  Use sunscreen. Apply sunscreen liberally and repeatedly throughout the day. You should seek shade when your shadow is shorter than you. Protect yourself by wearing long sleeves, pants, a wide-brimmed hat, and sunglasses year round whenever you are outdoors.  Tell your health care provider of new moles or changes in moles, especially if there is a change in shape or color. Also, tell your health care provider if a mole is larger than the size of a pencil eraser.  A one-time screening for abdominal aortic aneurysm (AAA) and surgical repair of large AAAs by ultrasound is recommended for men aged 56-75 years who are current or former smokers.  Stay current with your vaccines (immunizations).   This information is not intended to replace advice given to you by your health care provider. Make sure you discuss any questions you have with your health care provider.   Document Released: 05/18/2008 Document Revised: 12/11/2014 Document Reviewed: 04/17/2011 Elsevier Interactive Patient Education [employer].

## 2015-09-16 NOTE — Assessment & Plan Note (Signed)
Seed implant with Dr [name] at [facility]. PSA checked there.

## 2015-09-16 NOTE — Assessment & Plan Note (Addendum)
Advanced directives: in chart 09/2014. Wife Parker Martinez is Economist. No extraordinary efforts if terminal condition - does not want prolonged life support or feeding tube.

## 2015-09-16 NOTE — Assessment & Plan Note (Signed)
Continue to encourage cessation. Contemplative.  Previously underwent lung cancer screening at [facility].

## 2015-09-16 NOTE — Assessment & Plan Note (Signed)
Stable period - continue to monitor.

## 2015-09-16 NOTE — Assessment & Plan Note (Signed)
Followed by [facility] 

## 2015-09-21 ENCOUNTER — Telehealth: Payer: Self-pay | Admitting: Family Medicine

## 2015-09-21 NOTE — Telephone Encounter (Signed)
Spoke with patient. He elects to not try statin at this time, but to do dietary changes and have lipids rechecked at follow up.

## 2015-09-21 NOTE — Telephone Encounter (Signed)
Pt is requesting cb please call  [phone]

## 2015-09-22 NOTE — Telephone Encounter (Signed)
Noted  

## 2015-09-24 ENCOUNTER — Ambulatory Visit (INDEPENDENT_AMBULATORY_CARE_PROVIDER_SITE_OTHER): Payer: Medicare Other

## 2015-09-24 DIAGNOSIS — Z23 Encounter for immunization: Secondary | ICD-10-CM

## 2015-10-05 ENCOUNTER — Other Ambulatory Visit: Payer: Self-pay | Admitting: Family Medicine

## 2015-10-05 MED ORDER — HYDROCODONE-ACETAMINOPHEN 5-325 MG PO TABS: ORAL_TABLET | ORAL | Status: DC

## 2015-10-05 NOTE — Telephone Encounter (Signed)
Patient aware and Rx given to [facility] to give to patient.

## 2015-10-05 NOTE — Telephone Encounter (Signed)
Ok to refill 

## 2015-10-05 NOTE — Telephone Encounter (Signed)
Printed and in [name]'s box 

## 2015-10-11 ENCOUNTER — Ambulatory Visit (INDEPENDENT_AMBULATORY_CARE_PROVIDER_SITE_OTHER): Payer: Medicare Other

## 2015-10-11 DIAGNOSIS — I6523 Occlusion and stenosis of bilateral carotid arteries: Secondary | ICD-10-CM

## 2015-10-14 ENCOUNTER — Encounter: Payer: Self-pay | Admitting: *Deleted

## 2015-10-27 ENCOUNTER — Encounter (HOSPITAL_COMMUNITY): Payer: Medicare Other

## 2015-10-27 ENCOUNTER — Ambulatory Visit: Payer: Medicare Other | Admitting: Vascular Surgery

## 2015-11-01 ENCOUNTER — Encounter: Payer: Self-pay | Admitting: Vascular Surgery

## 2015-11-03 ENCOUNTER — Encounter: Payer: Self-pay | Admitting: Vascular Surgery

## 2015-11-03 ENCOUNTER — Ambulatory Visit (INDEPENDENT_AMBULATORY_CARE_PROVIDER_SITE_OTHER): Payer: Medicare Other | Admitting: Vascular Surgery

## 2015-11-03 ENCOUNTER — Ambulatory Visit (HOSPITAL_COMMUNITY)
Admission: RE | Admit: 2015-11-03 | Discharge: 2015-11-03 | Disposition: A | Discharge status: Home / Self Care | Payer: Medicare Other | Source: Ambulatory Visit | Attending: Vascular Surgery | Admitting: Vascular Surgery

## 2015-11-03 ENCOUNTER — Other Ambulatory Visit: Payer: Self-pay | Admitting: Vascular Surgery

## 2015-11-03 VITALS — BP 158/71 | HR 77 | Ht 69.0 in | Wt 172.1 lb

## 2015-11-03 DIAGNOSIS — I739 Peripheral vascular disease, unspecified: Secondary | ICD-10-CM | POA: Diagnosis not present

## 2015-11-03 DIAGNOSIS — I70211 Atherosclerosis of native arteries of extremities with intermittent claudication, right leg: Secondary | ICD-10-CM

## 2015-11-03 DIAGNOSIS — I6523 Occlusion and stenosis of bilateral carotid arteries: Secondary | ICD-10-CM | POA: Diagnosis not present

## 2015-11-03 DIAGNOSIS — I70219 Atherosclerosis of native arteries of extremities with intermittent claudication, unspecified extremity: Secondary | ICD-10-CM

## 2015-11-03 NOTE — Progress Notes (Signed)
[facility]  Patient name: Parker Martinez MRN: [id number] DOB: 01-15-1941 Sex: male  REASON FOR VISIT: Follow up of carotid disease and peripheral vascular disease  HPI: Parker Martinez is a 74 y.o. male who I last saw on 10/21/2014. He had stable claudication of the right calf. He was asymptomatic from a carotid standpoint. At the time of his last visit, he had bilateral 40-59% carotid stenoses that were followed by his primary care physician. With respect to his peripheral vascular disease, he had evidence of superficial femoral artery occlusive disease on the right and his symptoms were stable. He comes in for a 1 year follow up visit.  Since I saw him last, he does have stable bilateral lower extremity calf claudication which is essentially equal on both sides. He denies rest pain or history of nonhealing ulcers.  His carotid disease is followed by his primary care physician. He has been asymptomatic from this standpoint. He denies any history of stroke, TIAs, expressive or receptive aphasia, or amaurosis fugax. He is on aspirin. He does not take a statin because he's trying to control his cholesterol with diet.  Past Medical History  Diagnosis Date  . Allergic rhinitis   . Status post dilatation of esophageal stricture   . Duodenitis   . COPD (chronic obstructive pulmonary disease) (HCC)     severe, participated in [place] study  . Prostate cancer [facility]) 2011    prostate seed implant (Dr. [name])  . GERD (gastroesophageal reflux disease)   . History of kidney stones remote  . PAD (peripheral artery disease) ([place])   . Idiopathic anaphylaxis     h/o recurrent anaphylaxis, unknown trigger, s/p eval at [facility] [phone]), last episode 03/2011  . Arthritis     lumbar and cervical spine  . Smoker   . Diverticulosis 2008    severe by colonoscopy and CT  . Hemangioma     congenital left arm  . DDD (degenerative disc disease)     lumbar  (HNP R L3/4) s/p ESI, cervical  . Esophageal stricture 2012    s/p dilation  . Personal history of colonic adenomas 09/02/2013  . Diverticulitis 2015    by CT  . HTN (hypertension)     borderline readings at home  . Carotid stenosis 09/11/2014    40-59% bilat ICA stenosis, rpt 1 yr (09/2014)   . Abnormal drug screen 10/2013, 03/2015    low Cr, +EtOH, neg hydrocodone (10/2013, again 03/2015)    Family History  Problem Relation Age of Onset  . Lung cancer Father   . Hyperlipidemia Father   . Coronary artery disease Neg Hx   . Stroke Neg Hx   . Diabetes Neg Hx   . Arthritis Mother   . Breast cancer Daughter     SOCIAL HISTORY: Social History  Substance Use Topics  . Smoking status: Current Every Day Smoker -- 1.00 packs/day for 60 years    Types: Cigarettes  . Smokeless tobacco: Never Used     Comment: would like to quit not ready to. use vapor cig  . Alcohol Use: 7.2 oz/week    12 Cans of beer per week     Comment: socially    No Known Allergies  Current Outpatient Prescriptions  Medication Sig Dispense Refill  . albuterol (PROVENTIL HFA;VENTOLIN HFA) 108 (90 BASE) MCG/ACT inhaler Inhale 2 puffs into the lungs every 6 (six) hours as needed for wheezing. 6.7 g 3  .  alfuzosin (UROXATRAL) 10 MG 24 hr tablet Take 10 mg by mouth daily with breakfast.    . aspirin 81 MG tablet Take 81 mg by mouth every morning.     . cyclobenzaprine (FLEXERIL) 10 MG tablet Take 10 mg by mouth 2 (two) times daily as needed for muscle spasms.    . DULERA 200-5 MCG/ACT AERO INHALE 1 PUFF 2 TIMES DAILY AS NEEDED 13 g 2  . EPINEPHrine 0.3 mg/0.3 mL IJ SOAJ injection Inject 0.3 mLs (0.3 mg total) into the muscle once. 1 Device 1  . fexofenadine (ALLEGRA) 180 MG tablet Take 180 mg by mouth daily.    . fluticasone (FLONASE) 50 MCG/ACT nasal spray Place 1 spray into both nostrils 2 (two) times daily.    Marland Kitchen HYDROcodone-acetaminophen (NORCO/VICODIN) 5-325 MG tablet TAKE 1 TABLET BY MOUTH EVERY 6 HOURS AS  NEEDED FOR MODERATE PAIN 50 tablet 0  . montelukast (SINGULAIR) 10 MG tablet TAKE ONE TABLET BY MOUTH EVERY NIGHT AT BEDTIME 30 tablet 5  . Multiple Vitamin (MULTIVITAMIN) tablet Take 1 tablet by mouth daily.    . pantoprazole (PROTONIX) 40 MG tablet Take 1 tablet (40 mg total) by mouth daily before breakfast. Take 1 tablet by mouth each day 30 minutes before a meal 30 tablet 3  . ranitidine (ZANTAC) 150 MG tablet Take 150 mg by mouth daily.    . solifenacin (VESICARE) 5 MG tablet Take 5 mg by mouth daily.    Marland Kitchen SPIRIVA HANDIHALER 18 MCG inhalation capsule PLACE 1 CAPSULE INTO INHALER AND INHALE ONCE DAILY AS DIRECTED 30 capsule 6  . vitamin B-12 (CYANOCOBALAMIN) 500 MCG tablet Take 500 mcg by mouth every morning.      No current facility-administered medications for this visit.    REVIEW OF SYSTEMS:  '[X]'$  denotes positive finding, '[ ]'$  denotes negative finding Cardiac  Comments:  Chest pain or chest pressure:    Shortness of breath upon exertion: X   Short of breath when lying flat: X   Irregular heart rhythm:        Vascular    Pain in calf, thigh, or hip brought on by ambulation: X   Pain in feet at night that wakes you up from your sleep:  X   Blood clot in your veins:    Leg swelling:         Pulmonary    Oxygen at home:    Productive cough:  X   Wheezing:  X       Neurologic    Sudden weakness in arms or legs:     Sudden numbness in arms or legs:     Sudden onset of difficulty speaking or slurred speech:    Temporary loss of vision in one eye:     Problems with dizziness:         Gastrointestinal    Blood in stool:     Vomited blood:         Genitourinary    Burning when urinating:     Blood in urine:        Psychiatric    Major depression:         Hematologic    Bleeding problems:    Problems with blood clotting too easily:        Skin    Rashes or ulcers:        Constitutional    Fever or chills:      PHYSICAL EXAM: Filed Vitals:   11/03/15 4098  BP: 158/71  Pulse: 77  Height: '5\' 9"'$  (1.753 m)  Weight: 172 lb 1.6 oz (78.064 kg)  SpO2: 97%    GENERAL: The patient is a well-nourished male, in no acute distress. The vital signs are documented above. CARDIAC: There is a regular rate and rhythm.  VASCULAR: He has soft bilateral carotid bruits.  He has palpable femoral pulses. I cannot palpate popliteal or pedal pulses.  He has no significant lower extremity swelling. PULMONARY: There is good air exchange bilaterally without wheezing or rales. ABDOMEN: Soft and non-tender with normal pitched bowel sounds.  MUSCULOSKELETAL: There are no major deformities or cyanosis. NEUROLOGIC: No focal weakness or paresthesias are detected. SKIN: There are no ulcers or rashes noted. PSYCHIATRIC: The patient has a normal affect.  DATA:   ARTERIAL DOPPLER STUDY: I have independently interpreted his arterial Doppler study which shows monophasic Doppler signals in the right foot with an ABI of 67%.  The patient did have a carotid Doppler study done elsewhere on 10/11/2015 which showed a stable 40-59% right carotid stenosis with a less than 39% left carotid stenosis.  MEDICAL ISSUES:  PERIPHERAL VASCULAR DISEASE: he has stable bilateral lower extremity claudication. He understands we would not consider intervention unless he developed disabling claudication, rest pain, or nonhealing ulcer. I have ordered follow up ABIs in 18 months and I will see him back at that time. We have again discussed the importance of tobacco cessation. I have encouraged him to walk as much as possible. He knows to call sooner if he has problems.  BILATERAL CAROTID DISEASE: He is asymptomatic from a carotid standpoint. His carotid disease is followed by his primary care physician and he gets carotid duplex scans yearly. He understands we would not consider carotid intervention unless his disease progressed to greater than 80% or he develop new neurologic  symptoms.  HYPERTENSION: The patient's initial blood pressure today was elevated. We repeated this and this was still elevated. We have encouraged the patient to follow up with their primary care physician for management of their blood pressure.    Deitra Mayo [facility] [place]: [phone]

## 2015-11-08 ENCOUNTER — Other Ambulatory Visit: Payer: Self-pay | Admitting: Family Medicine

## 2015-12-13 ENCOUNTER — Other Ambulatory Visit: Payer: Self-pay

## 2015-12-13 MED ORDER — PANTOPRAZOLE SODIUM 40 MG PO TBEC: 40.0000 mg | DELAYED_RELEASE_TABLET | Freq: Every day | ORAL | Status: DC

## 2016-01-06 ENCOUNTER — Other Ambulatory Visit: Payer: Self-pay | Admitting: Family Medicine

## 2016-01-06 NOTE — Telephone Encounter (Signed)
Ok to refill 

## 2016-01-06 NOTE — Telephone Encounter (Signed)
Patient notified and Rx placed up front for pick up. 

## 2016-01-06 NOTE — Telephone Encounter (Signed)
Printed and in [name]'s box 

## 2016-01-11 ENCOUNTER — Other Ambulatory Visit: Payer: Self-pay | Admitting: Family Medicine

## 2016-02-28 ENCOUNTER — Other Ambulatory Visit: Payer: Self-pay | Admitting: Family Medicine

## 2016-03-16 ENCOUNTER — Ambulatory Visit: Payer: Medicare Other | Admitting: Family Medicine

## 2016-03-20 ENCOUNTER — Ambulatory Visit (INDEPENDENT_AMBULATORY_CARE_PROVIDER_SITE_OTHER): Payer: Medicare Other | Admitting: Family Medicine

## 2016-03-20 ENCOUNTER — Encounter: Payer: Self-pay | Admitting: Family Medicine

## 2016-03-20 VITALS — BP 160/80 | HR 100 | Temp 97.5°F | Wt 165.0 lb

## 2016-03-20 DIAGNOSIS — Z720 Tobacco use: Secondary | ICD-10-CM | POA: Diagnosis not present

## 2016-03-20 DIAGNOSIS — E785 Hyperlipidemia, unspecified: Secondary | ICD-10-CM

## 2016-03-20 DIAGNOSIS — I70213 Atherosclerosis of native arteries of extremities with intermittent claudication, bilateral legs: Secondary | ICD-10-CM

## 2016-03-20 DIAGNOSIS — I10 Essential (primary) hypertension: Secondary | ICD-10-CM | POA: Diagnosis not present

## 2016-03-20 DIAGNOSIS — I739 Peripheral vascular disease, unspecified: Secondary | ICD-10-CM

## 2016-03-20 DIAGNOSIS — J449 Chronic obstructive pulmonary disease, unspecified: Secondary | ICD-10-CM

## 2016-03-20 DIAGNOSIS — S30860A Insect bite (nonvenomous) of lower back and pelvis, initial encounter: Secondary | ICD-10-CM

## 2016-03-20 DIAGNOSIS — W57XXXA Bitten or stung by nonvenomous insect and other nonvenomous arthropods, initial encounter: Secondary | ICD-10-CM

## 2016-03-20 DIAGNOSIS — F17200 Nicotine dependence, unspecified, uncomplicated: Secondary | ICD-10-CM

## 2016-03-20 DIAGNOSIS — R892 Abnormal level of other drugs, medicaments and biological substances in specimens from other organs, systems and tissues: Secondary | ICD-10-CM

## 2016-03-20 LAB — LIPID PANEL
Cholesterol: 187 mg/dL (ref 0–200)
HDL: 75.6 mg/dL (ref >39.00)
LDL Cholesterol: 86 mg/dL (ref 0–99)
NonHDL: 111.08
Total CHOL/HDL Ratio: 2
Triglycerides: 123 mg/dL (ref 0.0–149.0)
VLDL: 24.6 mg/dL (ref 0.0–40.0)

## 2016-03-20 MED ORDER — HYDROCODONE-ACETAMINOPHEN 5-325 MG PO TABS: ORAL_TABLET | ORAL | Status: DC

## 2016-03-20 MED ORDER — PREDNISONE 10 MG PO TABS: ORAL_TABLET | ORAL | Status: DC

## 2016-03-20 MED ORDER — MOMETASONE FURO-FORMOTEROL FUM 200-5 MCG/ACT IN AERO: 2.0000 | INHALATION_SPRAY | Freq: Two times a day (BID) | RESPIRATORY_TRACT | Status: DC

## 2016-03-20 NOTE — Assessment & Plan Note (Signed)
Reviewed signs of tick borne illness to update [place] for abx course.

## 2016-03-20 NOTE — Assessment & Plan Note (Signed)
Sparing hydrocodone use. Update UDS today. Reviewed narcotic and alcohol use. Regular drinker averages 2 beers/day

## 2016-03-20 NOTE — Assessment & Plan Note (Signed)
bp elevated today - pt denies hypertension trouble recently. Advised to start checking at home and update me if persistently >140/90

## 2016-03-20 NOTE — Patient Instructions (Addendum)
Watch for any new fever, rash, joint pains, abdominal pain or nausea, or headache for up to 10 days after tick bite - if this happens let us know right away for antibiotic course. Start monitoring blood pressures at home and let us know if consistently >140/90.  Check with Rob on cheaper alternatives to dulera.  Take prednisone taper for next 8 days. After done with this, increase dulera to 2 puffs twice daily. Return in 1-2 months for spirometry.  Hydrocodone refilled today. Update urine drug screen. labwork today.  Work on quitting smoking.

## 2016-03-20 NOTE — Assessment & Plan Note (Signed)
Pt has worked on decreasing sugars in diet to improve triglycerides. Update FLP today. Discussed statin given PAD hx if LDL >70.

## 2016-03-20 NOTE — Progress Notes (Signed)
Pre visit review using our clinic review tool, if applicable. No additional management support is needed unless otherwise documented below in the visit note. 

## 2016-03-20 NOTE — Assessment & Plan Note (Addendum)
Worsening - pt will call to schedule f/u with [facility]

## 2016-03-20 NOTE — Progress Notes (Signed)
BP 160/80 mmHg  Pulse 100  Temp(Src) 97.5 F (36.4 C) (Oral)  Wt 165 lb (74.844 kg)  SpO2 95%   CC: 6 mo f/u visit  Subjective:    Patient ID: Parker Martinez, male    DOB: January 23, 1941, 75 y.o.   MRN: [id number]  HPI: Parker Martinez is a 75 y.o. male presenting on 03/20/2016 for Follow-up   Known PAD followed by Dr Scot Dock, last seen 10/2015. Stable R calf claudication. Worsening claudication symptoms endorsed as he needs to stop and rest with minimal walking - planning on f/u sooner with [facility]. No resting pain, no nonhealing ulcers. Known carotid disease last [place] 10/2015 showing 40-59% carotid stenosis on right and <39% on left. Pending f/u ABIs in 2018.   Known severe COPD not on oxygen. Compliant with spiriva and dulera. Continued 1/2-1 pd smoker. Needing rescue inhaler more often - uses once daily. Increased dyspnea at rest, wheezing, more productive cough progressively worsening over last several months. Denies significant allergic rhinitis. No fevers/chills. Feels more sinus congestion but denies facial pressure or sinus headache. Complaint with flonase, allegra, singulair.   Continued smoker - states he completed [facility] study with CT scan for lung cancer screening.   Multiple tick bites recently. Bite on left buttock with knot present.   Tested negative for hydrocodone and positive for alcohol on UDS 10/2013 and again 03/2015. Hydrocodone #50 ~Q3 mo. Endorses regular alcohol use (2 beers per day). Does not mix narcotic with alcohol.   Elevated blood pressure without history of hypertension. Doesn't check bp at home.   Relevant past medical, surgical, family and social history reviewed and updated as indicated. Interim medical history since our last visit reviewed. Allergies and medications reviewed and updated. Current Outpatient Prescriptions on File Prior to Visit  Medication Sig  . albuterol (PROVENTIL HFA;VENTOLIN HFA) 108 (90 BASE) MCG/ACT inhaler Inhale 2 puffs into  the lungs every 6 (six) hours as needed for wheezing.  Marland Kitchen alfuzosin (UROXATRAL) 10 MG 24 hr tablet Take 10 mg by mouth daily with breakfast.  . aspirin 81 MG tablet Take 81 mg by mouth every morning.   . cyclobenzaprine (FLEXERIL) 10 MG tablet Take 10 mg by mouth 2 (two) times daily as needed for muscle spasms.  Marland Kitchen EPINEPHrine 0.3 mg/0.3 mL IJ SOAJ injection Inject 0.3 mLs (0.3 mg total) into the muscle once.  . fexofenadine (ALLEGRA) 180 MG tablet Take 180 mg by mouth daily.  . fluticasone (FLONASE) 50 MCG/ACT nasal spray USE TWO SPRAYS IN EACH NOSTRIL DAILY  . montelukast (SINGULAIR) 10 MG tablet TAKE ONE (1) TABLET BY MOUTH EVERY NIGHTAT BEDTIME  . Multiple Vitamin (MULTIVITAMIN) tablet Take 1 tablet by mouth daily.  . pantoprazole (PROTONIX) 40 MG tablet Take 1 tablet (40 mg total) by mouth daily before breakfast. Take 1 tablet by mouth each day 30 minutes before a meal  . ranitidine (ZANTAC) 150 MG tablet Take 150 mg by mouth daily.  Marland Kitchen SPIRIVA HANDIHALER 18 MCG inhalation capsule PLACE 1 CAPSULE INTO INHALER AND INHALE ONCE DAILY AS DIRECTED  . vitamin B-12 (CYANOCOBALAMIN) 500 MCG tablet Take 500 mcg by mouth every morning.    No current facility-administered medications on file prior to visit.    Review of Systems Per HPI unless specifically indicated in ROS section     Objective:    BP 160/80 mmHg  Pulse 100  Temp(Src) 97.5 F (36.4 C) (Oral)  Wt 165 lb (74.844 kg)  SpO2 95%  Wt Readings  from Last 3 Encounters:  03/20/16 165 lb (74.844 kg)  11/03/15 172 lb 1.6 oz (78.064 kg)  09/16/15 169 lb 12 oz (76.998 kg)   Body mass index is 24.36 kg/(m^2).  Physical Exam  Constitutional: He appears well-developed and well-nourished. No distress.  HENT:  Mouth/Throat: Oropharynx is clear and moist. No oropharyngeal exudate.  Cardiovascular: Normal rate, regular rhythm, normal heart sounds and intact distal pulses.   No murmur heard. Pulmonary/Chest: Effort normal. No respiratory  distress. He has no decreased breath sounds. He has wheezes (coarse). He has no rhonchi. He has no rales.  Coarse throughout  Musculoskeletal: He exhibits no edema.  Skin: Skin is warm and dry. No rash noted.  Psychiatric: He has a normal mood and affect.  Nursing note and vitals reviewed.  Results for orders placed or performed in visit on 09/16/15  Lipid panel  Result Value Ref Range   Cholesterol 197 0 - 200 mg/dL   Triglycerides 186.0 (H) 0.0 - 149.0 mg/dL   HDL 66.60 >39.00 mg/dL   VLDL 37.2 0.0 - 40.0 mg/dL   LDL Cholesterol 93 0 - 99 mg/dL   Total CHOL/HDL Ratio 3    NonHDL 027.74   Basic metabolic panel  Result Value Ref Range   Sodium 141 135 - 145 mEq/L   Potassium 4.3 3.5 - 5.1 mEq/L   Chloride 102 96 - 112 mEq/L   CO2 31 19 - 32 mEq/L   Glucose, Bld 94 70 - 99 mg/dL   BUN 9 6 - 23 mg/dL   Creatinine, Ser 0.77 0.40 - 1.50 mg/dL   Calcium 10.1 8.4 - 10.5 mg/dL   GFR 104.60 >60.00 mL/min  Vitamin B12  Result Value Ref Range   Vitamin B-12 835 211 - 911 pg/mL  Vit D  25 hydroxy (rtn osteoporosis monitoring)  Result Value Ref Range   VITD 41.35 30.00 - 100.00 ng/mL  CBC with Differential/Platelet  Result Value Ref Range   WBC 7.4 4.0 - 10.5 K/uL   RBC 4.93 4.22 - 5.81 Mil/uL   Hemoglobin 16.0 13.0 - 17.0 g/dL   HCT 48.2 39.0 - 52.0 %   MCV 97.8 78.0 - 100.0 fl   MCHC 33.2 30.0 - 36.0 g/dL   RDW 12.9 11.5 - 15.5 %   Platelets 233.0 150.0 - 400.0 K/uL   Neutrophils Relative % 62.0 43.0 - 77.0 %   Lymphocytes Relative 25.2 12.0 - 46.0 %   Monocytes Relative 10.7 3.0 - 12.0 %   Eosinophils Relative 1.6 0.0 - 5.0 %   Basophils Relative 0.5 0.0 - 3.0 %   Neutro Abs 4.6 1.4 - 7.7 K/uL   Lymphs Abs 1.9 0.7 - 4.0 K/uL   Monocytes Absolute 0.8 0.1 - 1.0 K/uL   Eosinophils Absolute 0.1 0.0 - 0.7 K/uL   Basophils Absolute 0.0 0.0 - 0.1 K/uL  Hepatic function panel  Result Value Ref Range   Total Bilirubin 0.6 0.2 - 1.2 mg/dL   Bilirubin, Direct 0.1 0.0 - 0.3 mg/dL     Alkaline Phosphatase 68 39 - 117 U/L   AST 17 0 - 37 U/L   ALT 15 0 - 53 U/L   Total Protein 7.1 6.0 - 8.3 g/dL   Albumin 4.4 3.5 - 5.2 g/dL      Assessment & Plan:   Problem List Items Addressed This Visit    Current smoker    Did undergo lung cancer screening 2008 at [facility].  Continue to encourage cessation. Precontemplative. Discussed  implications of continued smoking with worsening COPD and PAD.      COPD (chronic obstructive pulmonary disease) (HCC) - Primary    Severe. Will need spirometry updated. Treat today minor COPD flare with prednisone course then increase dulera to 200/5 2 puffs bid.  Continue spiriva. Consider pulm referral. Does not have frequent bronchitis episodes. Anticipate emphysema.       Relevant Medications   mometasone-formoterol (DULERA) 200-5 MCG/ACT AERO   predniSONE (DELTASONE) 10 MG tablet   HTN (hypertension)    bp elevated today - pt denies hypertension trouble recently. Advised to start checking at home and update me if persistently >140/90      PAD (peripheral artery disease) ([place])    Notes worsening claudication - will call to schedule f/u appt with [facility]      Dyslipidemia    Pt has worked on decreasing sugars in diet to improve triglycerides. Update FLP today. Discussed statin given PAD hx if LDL >70.       Relevant Orders   Lipid panel   Atherosclerosis of native arteries of extremity with intermittent claudication (HCC)    Worsening - pt will call to schedule f/u with [facility]      Abnormal drug screen    Sparing hydrocodone use. Update UDS today. Reviewed narcotic and alcohol use. Regular drinker averages 2 beers/day      Tick bite    Reviewed signs of tick borne illness to update [place] for abx course.          Follow up plan: Return in about 6 weeks (around 05/01/2016), or as needed, for follow up visit.  Ria Bush, MD

## 2016-03-20 NOTE — Assessment & Plan Note (Signed)
Notes worsening claudication - will call to schedule f/u appt with [facility]

## 2016-03-20 NOTE — Assessment & Plan Note (Signed)
Did undergo lung cancer screening 2008 at [facility].  Continue to encourage cessation. Precontemplative. Discussed implications of continued smoking with worsening COPD and PAD.

## 2016-03-20 NOTE — Assessment & Plan Note (Signed)
Severe. Will need spirometry updated. Treat today minor COPD flare with prednisone course then increase dulera to 200/5 2 puffs bid.  Continue spiriva. Consider pulm referral. Does not have frequent bronchitis episodes. Anticipate emphysema.

## 2016-03-29 ENCOUNTER — Other Ambulatory Visit: Payer: Self-pay | Admitting: Family Medicine

## 2016-03-29 MED ORDER — PRAVASTATIN SODIUM 40 MG PO TABS: 40.0000 mg | ORAL_TABLET | Freq: Every day | ORAL | Status: DC

## 2016-04-13 ENCOUNTER — Other Ambulatory Visit: Payer: Self-pay

## 2016-04-13 MED ORDER — PANTOPRAZOLE SODIUM 40 MG PO TBEC: 40.0000 mg | DELAYED_RELEASE_TABLET | Freq: Every day | ORAL | Status: DC

## 2016-05-04 ENCOUNTER — Encounter: Payer: Self-pay | Admitting: Family Medicine

## 2016-05-04 ENCOUNTER — Ambulatory Visit (INDEPENDENT_AMBULATORY_CARE_PROVIDER_SITE_OTHER): Payer: Medicare Other | Admitting: Family Medicine

## 2016-05-04 VITALS — BP 126/70 | HR 75 | Temp 97.9°F | Wt 169.8 lb

## 2016-05-04 DIAGNOSIS — I739 Peripheral vascular disease, unspecified: Secondary | ICD-10-CM

## 2016-05-04 DIAGNOSIS — Z720 Tobacco use: Secondary | ICD-10-CM | POA: Diagnosis not present

## 2016-05-04 DIAGNOSIS — I70213 Atherosclerosis of native arteries of extremities with intermittent claudication, bilateral legs: Secondary | ICD-10-CM

## 2016-05-04 DIAGNOSIS — E785 Hyperlipidemia, unspecified: Secondary | ICD-10-CM

## 2016-05-04 DIAGNOSIS — F17200 Nicotine dependence, unspecified, uncomplicated: Secondary | ICD-10-CM

## 2016-05-04 DIAGNOSIS — J449 Chronic obstructive pulmonary disease, unspecified: Secondary | ICD-10-CM

## 2016-05-04 DIAGNOSIS — I10 Essential (primary) hypertension: Secondary | ICD-10-CM

## 2016-05-04 DIAGNOSIS — R892 Abnormal level of other drugs, medicaments and biological substances in specimens from other organs, systems and tissues: Secondary | ICD-10-CM

## 2016-05-04 MED ORDER — ALBUTEROL SULFATE (2.5 MG/3ML) 0.083% IN NEBU
2.5000 mg | INHALATION_SOLUTION | Freq: Once | RESPIRATORY_TRACT | Status: AC
  Administered 2016-05-04: 2.5 mg via RESPIRATORY_TRACT

## 2016-05-04 NOTE — Assessment & Plan Note (Signed)
bp better controlled today. Continue to mointor off meds.

## 2016-05-04 NOTE — Progress Notes (Signed)
Pre visit review using our clinic review tool, if applicable. No additional management support is needed unless otherwise documented below in the visit note. 

## 2016-05-04 NOTE — Assessment & Plan Note (Signed)
See above - we started pravastatin '40mg'$  daily. Goal LDL <70

## 2016-05-04 NOTE — Assessment & Plan Note (Signed)
Severe. Spirometry today.  Continue spiriva and dulera (last visit increased).

## 2016-05-04 NOTE — Patient Instructions (Addendum)
Update UDS today.  Continue pravastatin. I'm glad blood pressure is doing better.  Work on continued quitting smoking.  Spirometry today.  Return after 09/17/2016 for wellness visit.

## 2016-05-04 NOTE — Assessment & Plan Note (Signed)
Continue to encourage smoking cessation. 

## 2016-05-04 NOTE — Progress Notes (Signed)
BP 126/70 mmHg  Pulse 75  Temp(Src) 97.9 F (36.6 C) (Oral)  Wt 169 lb 12 oz (76.998 kg)  SpO2 97%   CC: spirometry  Subjective:    Patient ID: Parker Martinez, male    DOB: 06/08/1941, 75 y.o.   MRN: [id number]  HPI: Parker Martinez is a 74 y.o. male presenting on 05/04/2016 for Follow-up   Known severe COPD not on oxygen anticipate emphysema. Compliant with spiriva and dulera. Continued 1/2-1 pd smoker. Needing rescue inhaler more often - uses once daily. Increased dyspnea at rest, wheezing, more productive cough progressively worsening over last several months. Also complaint with flonase, allegra, singulair.   Treated for mild COPD exacerbation last visit - states prednisone significantly helped. However last night had another episode of dyspnea with increased sputum production.   However today he feels like his breathing is at baseline.   HLD - last visit we started pravastatin '40mg'$  daily. Denies myalgias.   Known PAD - followed by Dr Scot Dock [facility].   Due for updated UDS (unsure why not done last visit)  Relevant past medical, surgical, family and social history reviewed and updated as indicated. Interim medical history since our last visit reviewed. Allergies and medications reviewed and updated. Current Outpatient Prescriptions on File Prior to Visit  Medication Sig  . albuterol (PROVENTIL HFA;VENTOLIN HFA) 108 (90 BASE) MCG/ACT inhaler Inhale 2 puffs into the lungs every 6 (six) hours as needed for wheezing.  Marland Kitchen alfuzosin (UROXATRAL) 10 MG 24 hr tablet Take 10 mg by mouth daily with breakfast.  . aspirin 81 MG tablet Take 81 mg by mouth every morning.   . cyclobenzaprine (FLEXERIL) 10 MG tablet Take 10 mg by mouth 2 (two) times daily as needed for muscle spasms.  Marland Kitchen EPINEPHrine 0.3 mg/0.3 mL IJ SOAJ injection Inject 0.3 mLs (0.3 mg total) into the muscle once.  . fexofenadine (ALLEGRA) 180 MG tablet Take 180 mg by mouth daily.  . fluticasone (FLONASE) 50 MCG/ACT nasal  spray USE TWO SPRAYS IN EACH NOSTRIL DAILY  . HYDROcodone-acetaminophen (NORCO/VICODIN) 5-325 MG tablet TAKE 1 TABLET BY MOUTH EVERY 6 HOURS AS NEEDED FOR MODERATE PAIN  . mometasone-formoterol (DULERA) 200-5 MCG/ACT AERO Inhale 2 puffs into the lungs 2 (two) times daily.  . montelukast (SINGULAIR) 10 MG tablet TAKE ONE (1) TABLET BY MOUTH EVERY NIGHTAT BEDTIME  . Multiple Vitamin (MULTIVITAMIN) tablet Take 1 tablet by mouth daily.  . pantoprazole (PROTONIX) 40 MG tablet Take 1 tablet (40 mg total) by mouth daily before breakfast. Take 1 tablet by mouth each day 30 minutes before a meal  . pravastatin (PRAVACHOL) 40 MG tablet Take 1 tablet (40 mg total) by mouth daily.  . ranitidine (ZANTAC) 150 MG tablet Take 150 mg by mouth daily.  Marland Kitchen SPIRIVA HANDIHALER 18 MCG inhalation capsule PLACE 1 CAPSULE INTO INHALER AND INHALE ONCE DAILY AS DIRECTED  . vitamin B-12 (CYANOCOBALAMIN) 500 MCG tablet Take 500 mcg by mouth every morning.    No current facility-administered medications on file prior to visit.    Review of Systems Per HPI unless specifically indicated in ROS section     Objective:    BP 126/70 mmHg  Pulse 75  Temp(Src) 97.9 F (36.6 C) (Oral)  Wt 169 lb 12 oz (76.998 kg)  SpO2 97%  Wt Readings from Last 3 Encounters:  05/04/16 169 lb 12 oz (76.998 kg)  03/20/16 165 lb (74.844 kg)  11/03/15 172 lb 1.6 oz (78.064 kg)    Physical  Exam  Constitutional: He appears well-developed and well-nourished. No distress.  HENT:  Mouth/Throat: Oropharynx is clear and moist. No oropharyngeal exudate.  Cardiovascular: Normal rate, regular rhythm, normal heart sounds and intact distal pulses.   No murmur heard. Pulmonary/Chest: Effort normal. No respiratory distress. He has no decreased breath sounds. He has wheezes (exp). He has no rhonchi. He has no rales.  coarse  Musculoskeletal: He exhibits no edema.  Nursing note and vitals reviewed.  Results for orders placed or performed in visit on  03/20/16  Lipid panel  Result Value Ref Range   Cholesterol 187 0 - 200 mg/dL   Triglycerides 123.0 0.0 - 149.0 mg/dL   HDL 75.60 >39.00 mg/dL   VLDL 24.6 0.0 - 40.0 mg/dL   LDL Cholesterol 86 0 - 99 mg/dL   Total CHOL/HDL Ratio 2    NonHDL 111.08       Assessment & Plan:   Problem List Items Addressed This Visit    Current smoker    Continue to encourage smoking cessation.      COPD (chronic obstructive pulmonary disease) (HCC) - Primary    Severe. Spirometry today.  Continue spiriva and dulera (last visit increased).       Relevant Orders   Spirometry: Pre & Post Eval   HTN (hypertension)    bp better controlled today. Continue to mointor off meds.      PAD (peripheral artery disease) ([place])    Last visit we started pravastatin for his PAD. Continue aspirin as well. Suggested sooner f/u with [facility] if worsening leg pain.       Dyslipidemia    See above - we started pravastatin '40mg'$  daily. Goal LDL <70      Atherosclerosis of native arteries of extremity with intermittent claudication (HCC)   Abnormal drug screen    Update UDS. Was not done last visit.          Follow up plan: Return in about 4 months (around 09/17/2016), or as needed, for annual exam, prior fasting for blood work.  Ria Bush, MD

## 2016-05-04 NOTE — Addendum Note (Signed)
Addended by: Modena Nunnery on: 05/04/2016 10:32 AM   Modules accepted: Orders

## 2016-05-04 NOTE — Assessment & Plan Note (Signed)
Update UDS. Was not done last visit.

## 2016-05-04 NOTE — Assessment & Plan Note (Signed)
Last visit we started pravastatin for his PAD. Continue aspirin as well. Suggested sooner f/u with [facility] if worsening leg pain.

## 2016-05-10 ENCOUNTER — Telehealth: Payer: Self-pay

## 2016-05-10 DIAGNOSIS — J449 Chronic obstructive pulmonary disease, unspecified: Secondary | ICD-10-CM

## 2016-05-10 NOTE — Telephone Encounter (Signed)
Pt left v/m requesting results of spirometry done on 05/04/16. Pt request cb.

## 2016-05-10 NOTE — Telephone Encounter (Signed)
i am able to open it under the media tab

## 2016-05-10 NOTE — Telephone Encounter (Signed)
When I try to open the results I get "The path specified in the workstation settings does not exist." but I am logging onto epic remotely.  If it opens for you all physically at the office I'll try to look at them tomorrow.

## 2016-05-10 NOTE — Telephone Encounter (Signed)
I'm sorry for the delay - I've been trying to open the report but for some reason it didn't go through.  Can we see if you can open it Parker Martinez, if not patient will have to return for repeat - would offer he come in for nurse visit at his convenience and we will do a no charge visit. Will cc Earl Lagos and Adrienne to see if we can fix this.

## 2016-05-10 NOTE — Telephone Encounter (Signed)
Able to receive copy of spirometry: severe airway obstruction without significant response to bronchodilator. Pre: FVC 102%, FEV1 45%, ratio 0.33 Post: FVC 102%, FEV1 48%, ratio 0.36  plz notify patient spirometry showed severe COPD - recommend continue triple therapy with spiriva and high dose dulera, with albuterol as needed. If frequent recurrent bronchitis will need to discuss other meds for this vs pulm referral. Only thing left to do to prevent progression of lung disease (and possible oxygen need) is quit smoking so keep working towards this.  I'd like to get copy of CT scan previously done at [facility] - can he sign ROI for this?

## 2016-05-10 NOTE — Telephone Encounter (Signed)
Patient notified. I will attempt to get copy of CT scan, but patient said it was done at least 30 years ago, so I doubt the record is still there.

## 2016-05-28 NOTE — Progress Notes (Signed)
Able to receive copy of spirometry: severe airway obstruction without significant response to bronchodilator. Pre: FVC 102%, FEV1 45%, ratio 0.33 Post: FVC 102%, FEV1 48%, ratio 0.36

## 2016-07-21 ENCOUNTER — Telehealth: Payer: Self-pay

## 2016-07-21 NOTE — Telephone Encounter (Signed)
Pt states he's had ankle and lower leg swelling for the past 3 days. He would like to know if he needs to go back to taking hydrochlorothiazide? appt needed? Thanks.

## 2016-07-21 NOTE — Telephone Encounter (Signed)
rec increased water, avoid salt, elevate legs over weekend. If no better, come in for eval.

## 2016-07-21 NOTE — Telephone Encounter (Signed)
Patient notified and verbalized understanding. 

## 2016-07-24 ENCOUNTER — Other Ambulatory Visit: Payer: Self-pay | Admitting: *Deleted

## 2016-07-24 MED ORDER — HYDROCODONE-ACETAMINOPHEN 5-325 MG PO TABS: ORAL_TABLET | ORAL | 0 refills | Status: DC

## 2016-07-24 NOTE — Telephone Encounter (Signed)
Printed and in [name]'s box 

## 2016-07-24 NOTE — Telephone Encounter (Signed)
Ok to refill? Last filled 03/20/16 #50 0RF

## 2016-07-25 NOTE — Telephone Encounter (Signed)
Left message to return call. rx ready for pick up. Placed at front desk.

## 2016-08-17 ENCOUNTER — Other Ambulatory Visit: Payer: Self-pay | Admitting: Internal Medicine

## 2016-09-13 ENCOUNTER — Encounter: Payer: Self-pay | Admitting: Internal Medicine

## 2016-09-20 ENCOUNTER — Encounter: Payer: Self-pay | Admitting: *Deleted

## 2016-09-20 ENCOUNTER — Encounter: Payer: Self-pay | Admitting: Family Medicine

## 2016-09-20 ENCOUNTER — Ambulatory Visit (INDEPENDENT_AMBULATORY_CARE_PROVIDER_SITE_OTHER): Payer: Medicare Other | Admitting: Family Medicine

## 2016-09-20 VITALS — BP 160/80 | HR 88 | Temp 97.4°F | Ht 69.0 in | Wt 170.5 lb

## 2016-09-20 DIAGNOSIS — Z0000 Encounter for general adult medical examination without abnormal findings: Secondary | ICD-10-CM

## 2016-09-20 DIAGNOSIS — Z23 Encounter for immunization: Secondary | ICD-10-CM

## 2016-09-20 DIAGNOSIS — K219 Gastro-esophageal reflux disease without esophagitis: Secondary | ICD-10-CM

## 2016-09-20 DIAGNOSIS — C61 Malignant neoplasm of prostate: Secondary | ICD-10-CM

## 2016-09-20 DIAGNOSIS — E785 Hyperlipidemia, unspecified: Secondary | ICD-10-CM

## 2016-09-20 DIAGNOSIS — Z7189 Other specified counseling: Secondary | ICD-10-CM

## 2016-09-20 DIAGNOSIS — D751 Secondary polycythemia: Secondary | ICD-10-CM

## 2016-09-20 DIAGNOSIS — J449 Chronic obstructive pulmonary disease, unspecified: Secondary | ICD-10-CM

## 2016-09-20 DIAGNOSIS — I739 Peripheral vascular disease, unspecified: Secondary | ICD-10-CM

## 2016-09-20 DIAGNOSIS — F17200 Nicotine dependence, unspecified, uncomplicated: Secondary | ICD-10-CM

## 2016-09-20 DIAGNOSIS — I6523 Occlusion and stenosis of bilateral carotid arteries: Secondary | ICD-10-CM

## 2016-09-20 DIAGNOSIS — I70213 Atherosclerosis of native arteries of extremities with intermittent claudication, bilateral legs: Secondary | ICD-10-CM

## 2016-09-20 DIAGNOSIS — I10 Essential (primary) hypertension: Secondary | ICD-10-CM

## 2016-09-20 NOTE — Assessment & Plan Note (Signed)
Severe. Continue spiriva and dulera - pt will check on new insurance formulary

## 2016-09-20 NOTE — Assessment & Plan Note (Signed)

## 2016-09-20 NOTE — Assessment & Plan Note (Signed)
Update FLP when returns fasting. Goal LDL <70

## 2016-09-20 NOTE — Assessment & Plan Note (Signed)
Appreciate uro care of patient.

## 2016-09-20 NOTE — Progress Notes (Signed)
Pre visit review using our clinic review tool, if applicable. No additional management support is needed unless otherwise documented below in the visit note. 

## 2016-09-20 NOTE — Assessment & Plan Note (Addendum)
Advanced directives: has living will at home, scanned into chart (09/2014). Wife is Parker Martinez. "no extraordinary efforts" does not want feeding tube.

## 2016-09-20 NOTE — Assessment & Plan Note (Signed)
Update carotid US.  

## 2016-09-20 NOTE — Assessment & Plan Note (Addendum)
Followed by [facility] - activity limiting claudication.

## 2016-09-20 NOTE — Progress Notes (Signed)
BP (!) 160/80   Pulse 88   Temp 97.4 F (36.3 C) (Oral)   Ht '5\' 9"'$  (1.753 m)   Wt 170 lb 8 oz (77.3 kg)   BMI 25.18 kg/m    CC: medicare wellness visit Subjective:    Patient ID: Parker Martinez, male    DOB: 08-Jan-1941, 75 y.o.   MRN: [id number]  HPI: Parker Martinez is a 75 y.o. male presenting on 09/20/2016 for Annual Exam   Parker Martinez passed away 2015-02-19. He spread ashes in the sea with family.  Planning on switching insurance company to [employer].   Endorses good bp range at home when checked a few times a week - 135-140/70.   Known severe COPD presumed emphysema not oxygen dependent - on spiriva and dulera. Continued 1/2-1ppd smoker Spirometry 05/2016: Severe airway obstruction without significant response to bronchodilator. Pre: FVC 102%, FEV1 45%, ratio 0.33 Post: FVC 102%, FEV1 48%, ratio 0.36  Has not submitted to UDS last 2 visits.  Rare hydrocodone use, last #50 refill 07/24/2016. Aleve is helpful.   Vision screen - passed Hearing - persistent trouble at high frequencies. 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 Discussed ear protection. Declines audiology referral at this time.  No falls in last year Denies depression, anhedonia.   Preventative: COLONOSCOPY Date: 08/2013 4 tubular adenomas, severe divertic, rpt 3 yrs [name]). due for this.  Prostate cancer - prostate seeds started 02-19-15 (Dr. [name] at [facility]). Normal recent CT scan.  Lung cancer screening - [facility] completed [facility]  Flu shot - yearly Tetanus - unsure Pneumonia 2010-02-18, prevnar 02/18/2014 Shingles shot - will check with insurance. Advanced directives: has living will at home, scanned into chart (09/2014). Wife is Parker Martinez. "no extraordinary efforts" does not want feeding tube. Seat belt use discussed Sunscreen use discussed. No changing moles on skin.  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 Interim medical history since our last visit reviewed. Allergies and medications reviewed and updated. Current Outpatient Prescriptions on File Prior to Visit  Medication Sig  . albuterol (PROVENTIL HFA;VENTOLIN HFA) 108 (90 BASE) MCG/ACT inhaler Inhale 2 puffs into the lungs every 6 (six) hours as needed for wheezing.  Marland Kitchen alfuzosin (UROXATRAL) 10 MG 24 hr tablet Take 10 mg by mouth daily with breakfast.  . aspirin 81 MG tablet Take 81 mg by mouth every morning.   . cyclobenzaprine (FLEXERIL) 10 MG tablet Take 10 mg by mouth 2 (two) times daily as needed for muscle spasms.  Marland Kitchen EPINEPHrine 0.3 mg/0.3 mL IJ SOAJ injection Inject 0.3 mLs (0.3 mg total) into the muscle once.  . fexofenadine (ALLEGRA) 180 MG tablet Take 180 mg by mouth daily.  . fluticasone (FLONASE) 50 MCG/ACT nasal spray USE TWO SPRAYS IN EACH NOSTRIL DAILY  . HYDROcodone-acetaminophen (NORCO/VICODIN) 5-325 MG tablet TAKE 1 TABLET BY MOUTH EVERY 6 HOURS AS NEEDED FOR MODERATE PAIN  . mometasone-formoterol (DULERA) 200-5 MCG/ACT AERO Inhale 2 puffs into the lungs 2 (two) times daily.  . montelukast (SINGULAIR) 10 MG tablet TAKE ONE (1) TABLET BY MOUTH EVERY NIGHTAT BEDTIME  . Multiple Vitamin (MULTIVITAMIN) tablet Take 1 tablet by mouth daily.  . pantoprazole (PROTONIX) 40 MG tablet TAKE 1 TABLET BY MOUTH DAILY BEFORE BREAKFAST  .  pravastatin (PRAVACHOL) 40 MG tablet Take 1 tablet (40 mg total) by mouth daily.  . ranitidine (ZANTAC) 150 MG tablet Take 150 mg by mouth daily.  Marland Kitchen SPIRIVA HANDIHALER 18 MCG inhalation capsule PLACE 1 CAPSULE INTO INHALER AND INHALE ONCE DAILY AS DIRECTED  . vitamin B-12 (CYANOCOBALAMIN) 500 MCG tablet Take 500 mcg by mouth every morning.    No current facility-administered medications on file  prior to visit.     Review of Systems  Constitutional: Negative for activity change, appetite change, chills, fatigue, fever and unexpected weight change.       Stays cold  HENT: Negative for hearing loss.   Eyes: Negative for visual disturbance.  Respiratory: Positive for cough, shortness of breath and wheezing. Negative for chest tightness.   Cardiovascular: Negative for chest pain, palpitations and leg swelling.  Gastrointestinal: Negative for abdominal distention, abdominal pain, blood in stool, constipation, diarrhea, nausea and vomiting.  Genitourinary: Negative for difficulty urinating and hematuria.  Musculoskeletal: Negative for arthralgias, myalgias and neck pain.  Skin: Negative for rash.  Neurological: Negative for dizziness, seizures, syncope and headaches.  Hematological: Negative for adenopathy. Does not bruise/bleed easily.  Psychiatric/Behavioral: Negative for dysphoric mood. The patient is not nervous/anxious.    Per HPI unless specifically indicated in ROS section     Objective:    BP (!) 160/80   Pulse 88   Temp 97.4 F (36.3 C) (Oral)   Ht '5\' 9"'$  (1.753 m)   Wt 170 lb 8 oz (77.3 kg)   BMI 25.18 kg/m   Wt Readings from Last 3 Encounters:  09/20/16 170 lb 8 oz (77.3 kg)  05/04/16 169 lb 12 oz (77 kg)  03/20/16 165 lb (74.8 kg)    Physical Exam  Constitutional: He is oriented to person, place, and time. He appears well-developed and well-nourished. No distress.  HENT:  Head: Normocephalic and atraumatic.  Right Ear: Hearing, tympanic membrane, external ear and ear canal normal.  Left Ear: Hearing, tympanic membrane, external ear and ear canal normal.  Nose: Nose normal.  Mouth/Throat: Uvula is midline, oropharynx is clear and moist and mucous membranes are normal. No oropharyngeal exudate, posterior oropharyngeal edema or posterior oropharyngeal erythema.  Eyes: Conjunctivae and EOM are normal. Pupils are equal, round, and reactive to light. No scleral  icterus.  Neck: Normal range of motion. Neck supple. Carotid bruit is not present. No thyromegaly present.  Cardiovascular: Normal rate, regular rhythm, normal heart sounds and intact distal pulses.   No murmur heard. Pulses:      Radial pulses are 2+ on the right side, and 2+ on the left side.  Pulmonary/Chest: Effort normal and breath sounds normal. No respiratory distress. He has no wheezes. He has no rales.  Abdominal: Soft. Bowel sounds are normal. He exhibits no distension and no mass. There is no tenderness. There is no rebound and no guarding.  Musculoskeletal: Normal range of motion. He exhibits no edema.  Lymphadenopathy:    He has no cervical adenopathy.  Neurological: He is alert and oriented to person, place, and time.  CN grossly intact, station and gait intact Recall 3/3 Calculation 5/5 serial 3s  Skin: Skin is warm and dry. No rash noted.  Psychiatric: He has a normal mood and affect. His behavior is normal. Judgment and thought content normal.  Nursing note and vitals reviewed.  Results for orders placed or performed in visit on 03/20/16  Lipid panel  Result Value Ref Range   Cholesterol 187 0 - 200 mg/dL  Triglycerides 123.0 0.0 - 149.0 mg/dL   HDL 75.60 >39.00 mg/dL   VLDL 24.6 0.0 - 40.0 mg/dL   LDL Cholesterol 86 0 - 99 mg/dL   Total CHOL/HDL Ratio 2    NonHDL 111.08    Lab Results  Component Value Date   CREATININE 0.77 09/16/2015       Assessment & Plan:   Problem List Items Addressed This Visit    Advanced care planning/counseling discussion    Advanced directives: has living will at home, scanned into chart (09/2014). Wife is Parker Martinez. "no extraordinary efforts" does not want feeding tube.      Atherosclerosis of native arteries of extremity with intermittent claudication (HCC)    Followed by [facility] - activity limiting claudication.       Carotid stenosis    Update carotid US      Relevant Orders   VAS US CAROTID   COPD, severe (HCC)     Severe. Continue spiriva and dulera - pt will check on new insurance formulary      Current smoker    Continue to encourage cessation. Contemplative.       Dyslipidemia    Update FLP when returns fasting. Goal LDL <70      Relevant Orders   LDL Cholesterol, Direct   GERD (gastroesophageal reflux disease)    Continue PPI/zantac. Discussed relation of GERD to alcohol use.       Healthcare maintenance    Preventative protocols reviewed and updated unless pt declined. Discussed healthy diet and lifestyle.       HTN (hypertension)    bp elevated today but pt endorses well controlled at home - will continue to monitor at home.      Relevant Orders   Basic metabolic panel   Medicare annual wellness visit, subsequent - Primary    I have personally reviewed the Medicare Annual Wellness questionnaire and have noted 1. The patient's medical and social history 2. Their use of alcohol, tobacco or illicit drugs 3. Their current medications and supplements 4. The patient's functional ability including ADL's, fall risks, home safety risks and hearing or visual impairment. Cognitive function has been assessed and addressed as indicated.  5. Diet and physical activity 6. Evidence for depression or mood disorders The patients weight, height, BMI have been recorded in the chart. I have made referrals, counseling and provided education to the patient based on review of the above and I have provided the pt with a written personalized care plan for preventive services. Provider list updated.. See scanned questionairre as needed for further documentation. Reviewed preventative protocols and updated unless pt declined.       PAD (peripheral artery disease) (HCC)    Continue aspirin, statin.      Polycythemia    Anticipate secondary polycythemia. Update labs.      Relevant Orders   CBC with Differential/Platelet   Prostate cancer ([place])    Appreciate uro care of patient.        Other  Visit Diagnoses   None.      Follow up plan: Return in about 4 months (around 01/21/2017), or as needed, for follow up visit.  Ria Bush, MD

## 2016-09-20 NOTE — Assessment & Plan Note (Addendum)
bp elevated today but pt endorses well controlled at home - will continue to monitor at home.

## 2016-09-20 NOTE — Assessment & Plan Note (Signed)
Preventative protocols reviewed and updated unless pt declined. Discussed healthy diet and lifestyle.  

## 2016-09-20 NOTE — Assessment & Plan Note (Signed)
Continue to encourage cessation. Contemplative.

## 2016-09-20 NOTE — Assessment & Plan Note (Signed)
Continue aspirin, statin.  

## 2016-09-20 NOTE — Addendum Note (Signed)
Addended by: Royann Shivers A on: 09/20/2016 10:49 AM   Modules accepted: Orders

## 2016-09-20 NOTE — Assessment & Plan Note (Signed)
Continue PPI/zantac. Discussed relation of GERD to alcohol use.

## 2016-09-20 NOTE — Patient Instructions (Addendum)
Flu shot today UDS today.  Keep an eye on blood pressure at home - goal <140/90. Today it was high. If persistently elevated at home, let us know.  Return at your convenience for fasting labs  Check on covered alternatives to dulera (symbicort or advair or breo) and albuterol (ventolin, proair, preventil).  Let us know when you'd like to see audiologist.  Call Dr Celesta Aver office to schedule colonoscopy - see if endoscopy can be done at same time.  Return as needed or in 4 months for follow up visit.   Health Maintenance, Male A healthy lifestyle and preventative care can promote health and wellness.  Maintain regular health, dental, and eye exams.  Eat a healthy diet. Foods like vegetables, fruits, whole grains, low-fat dairy products, and lean protein foods contain the nutrients you need and are low in calories. Decrease your intake of foods high in solid fats, added sugars, and salt. Get information about a proper diet from your health care provider, if necessary.  Regular physical exercise is one of the most important things you can do for your health. Most adults should get at least 150 minutes of moderate-intensity exercise (any activity that increases your heart rate and causes you to sweat) each week. In addition, most adults need muscle-strengthening exercises on 2 or more days a week.   Maintain a healthy weight. The body mass index (BMI) is a screening tool to identify possible weight problems. It provides an estimate of body fat based on height and weight. Your health care provider can find your BMI and can help you achieve or maintain a healthy weight. For males 20 years and older:  A BMI below 18.5 is considered underweight.  A BMI of 18.5 to 24.9 is normal.  A BMI of 25 to 29.9 is considered overweight.  A BMI of 30 and above is considered obese.  Maintain normal blood lipids and cholesterol by exercising and minimizing your intake of saturated fat. Eat a balanced diet  with plenty of fruits and vegetables. Blood tests for lipids and cholesterol should begin at age 59 and be repeated every 5 years. If your lipid or cholesterol levels are high, you are over age 12, or you are at high risk for heart disease, you may need your cholesterol levels checked more frequently.Ongoing high lipid and cholesterol levels should be treated with medicines if diet and exercise are not working.  If you smoke, find out from your health care provider how to quit. If you do not use tobacco, do not start.  Lung cancer screening is recommended for adults aged 20-80 years who are at high risk for developing lung cancer because of a history of smoking. A yearly low-dose CT scan of the lungs is recommended for people who have at least a 30-pack-year history of smoking and are current smokers or have quit within the past 15 years. A pack year of smoking is smoking an average of 1 pack of cigarettes a day for 1 year (for example, a 30-pack-year history of smoking could mean smoking 1 pack a day for 30 years or 2 packs a day for 15 years). Yearly screening should continue until the smoker has stopped smoking for at least 15 years. Yearly screening should be stopped for people who develop a health problem that would prevent them from having lung cancer treatment.  If you choose to drink alcohol, do not have more than 2 drinks per day. One drink is considered to be 12 oz (360  mL) of beer, 5 oz (150 mL) of wine, or 1.5 oz (45 mL) of liquor.  Avoid the use of street drugs. Do not share needles with anyone. Ask for help if you need support or instructions about stopping the use of drugs.  High blood pressure causes heart disease and increases the risk of stroke. High blood pressure is more likely to develop in:  People who have blood pressure in the end of the normal range (100-139/85-89 mm Hg).  People who are overweight or obese.  People who are African American.  If you are 52-44 years of age,  have your blood pressure checked every 3-5 years. If you are 45 years of age or older, have your blood pressure checked every year. You should have your blood pressure measured twice--once when you are at a hospital or clinic, and once when you are not at a hospital or clinic. Record the average of the two measurements. To check your blood pressure when you are not at a hospital or clinic, you can use:  An automated blood pressure machine at a pharmacy.  A home blood pressure monitor.  If you are 13-54 years old, ask your health care provider if you should take aspirin to prevent heart disease.  Diabetes screening involves taking a blood sample to check your fasting blood sugar level. This should be done once every 3 years after age 55 if you are at a normal weight and without risk factors for diabetes. Testing should be considered at a younger age or be carried out more frequently if you are overweight and have at least 1 risk factor for diabetes.  Colorectal cancer can be detected and often prevented. Most routine colorectal cancer screening begins at the age of 74 and continues through age 36. However, your health care provider may recommend screening at an earlier age if you have risk factors for colon cancer. On a yearly basis, your health care provider may provide home test kits to check for hidden blood in the stool. A small camera at the end of a tube may be used to directly examine the colon (sigmoidoscopy or colonoscopy) to detect the earliest forms of colorectal cancer. Talk to your health care provider about this at age 87 when routine screening begins. A direct exam of the colon should be repeated every 5-10 years through age 16, unless early forms of precancerous polyps or small growths are found.  People who are at an increased risk for hepatitis B should be screened for this virus. You are considered at high risk for hepatitis B if:  You were born in a country where hepatitis B occurs  often. Talk with your health care provider about which countries are considered high risk.  Your parents were born in a high-risk country and you have not received a shot to protect against hepatitis B (hepatitis B vaccine).  You have HIV or AIDS.  You use needles to inject street drugs.  You live with, or have sex with, someone who has hepatitis B.  You are a man who has sex with other men (MSM).  You get hemodialysis treatment.  You take certain medicines for conditions like cancer, organ transplantation, and autoimmune conditions.  Hepatitis C blood testing is recommended for all people born from [birth year] through 1966 and any individual with known risk factors for hepatitis C.  Healthy men should no longer receive prostate-specific antigen (PSA) blood tests as part of routine cancer screening. Talk to your health care provider  about prostate cancer screening.  Testicular cancer screening is not recommended for adolescents or adult males who have no symptoms. Screening includes self-exam, a health care provider exam, and other screening tests. Consult with your health care provider about any symptoms you have or any concerns you have about testicular cancer.  Practice safe sex. Use condoms and avoid high-risk sexual practices to reduce the spread of sexually transmitted infections (STIs).  You should be screened for STIs, including gonorrhea and chlamydia if:  You are sexually active and are younger than 24 years.  You are older than 24 years, and your health care provider tells you that you are at risk for this type of infection.  Your sexual activity has changed since you were last screened, and you are at an increased risk for chlamydia or gonorrhea. Ask your health care provider if you are at risk.  If you are at risk of being infected with HIV, it is recommended that you take a prescription medicine daily to prevent HIV infection. This is called pre-exposure prophylaxis (PrEP). You  are considered at risk if:  You are a man who has sex with other men (MSM).  You are a heterosexual man who is sexually active with multiple partners.  You take drugs by injection.  You are sexually active with a partner who has HIV.  Talk with your health care provider about whether you are at high risk of being infected with HIV. If you choose to begin PrEP, you should first be tested for HIV. You should then be tested every 3 months for as long as you are taking PrEP.  Use sunscreen. Apply sunscreen liberally and repeatedly throughout the day. You should seek shade when your shadow is shorter than you. Protect yourself by wearing long sleeves, pants, a wide-brimmed hat, and sunglasses year round whenever you are outdoors.  Tell your health care provider of new moles or changes in moles, especially if there is a change in shape or color. Also, tell your health care provider if a mole is larger than the size of a pencil eraser.  A one-time screening for abdominal aortic aneurysm (AAA) and surgical repair of large AAAs by ultrasound is recommended for men aged 55-75 years who are current or former smokers.  Stay current with your vaccines (immunizations).   This information is not intended to replace advice given to you by your health care provider. Make sure you discuss any questions you have with your health care provider.   Document Released: 05/18/2008 Document Revised: 12/11/2014 Document Reviewed: 04/17/2011 Elsevier Interactive Patient Education [employer].

## 2016-09-20 NOTE — Assessment & Plan Note (Addendum)
Anticipate secondary polycythemia. Update labs.

## 2016-09-22 ENCOUNTER — Other Ambulatory Visit (INDEPENDENT_AMBULATORY_CARE_PROVIDER_SITE_OTHER): Payer: Medicare Other

## 2016-09-22 DIAGNOSIS — I10 Essential (primary) hypertension: Secondary | ICD-10-CM

## 2016-09-22 DIAGNOSIS — D751 Secondary polycythemia: Secondary | ICD-10-CM | POA: Diagnosis not present

## 2016-09-22 LAB — CBC WITH DIFFERENTIAL/PLATELET
Basophils Absolute: 0 10*3/uL (ref 0.0–0.1)
Basophils Relative: 0.6 % (ref 0.0–3.0)
Eosinophils Absolute: 0.4 10*3/uL (ref 0.0–0.7)
Eosinophils Relative: 6.3 % — ABNORMAL HIGH (ref 0.0–5.0)
HCT: 42 % (ref 39.0–52.0)
Hemoglobin: 14.4 g/dL (ref 13.0–17.0)
Lymphocytes Relative: 30.1 % (ref 12.0–46.0)
Lymphs Abs: 1.9 10*3/uL (ref 0.7–4.0)
MCHC: 34.3 g/dL (ref 30.0–36.0)
MCV: 94.9 fl (ref 78.0–100.0)
Monocytes Absolute: 0.8 10*3/uL (ref 0.1–1.0)
Monocytes Relative: 12.8 % — ABNORMAL HIGH (ref 3.0–12.0)
Neutro Abs: 3.1 10*3/uL (ref 1.4–7.7)
Neutrophils Relative %: 50.2 % (ref 43.0–77.0)
Platelets: 244 10*3/uL (ref 150.0–400.0)
RBC: 4.43 Mil/uL (ref 4.22–5.81)
RDW: 12.9 % (ref 11.5–15.5)
WBC: 6.2 10*3/uL (ref 4.0–10.5)

## 2016-09-22 LAB — BASIC METABOLIC PANEL
BUN: 7 mg/dL (ref 6–23)
CO2: 31 mEq/L (ref 19–32)
Calcium: 9.8 mg/dL (ref 8.4–10.5)
Chloride: 106 mEq/L (ref 96–112)
Creatinine, Ser: 0.84 mg/dL (ref 0.40–1.50)
GFR: 94.35 mL/min (ref >60.00)
Glucose, Bld: 94 mg/dL (ref 70–99)
Potassium: 4.7 mEq/L (ref 3.5–5.1)
Sodium: 143 mEq/L (ref 135–145)

## 2016-09-25 ENCOUNTER — Other Ambulatory Visit (INDEPENDENT_AMBULATORY_CARE_PROVIDER_SITE_OTHER): Payer: Medicare Other

## 2016-09-25 ENCOUNTER — Encounter: Payer: Self-pay | Admitting: *Deleted

## 2016-09-25 DIAGNOSIS — E785 Hyperlipidemia, unspecified: Secondary | ICD-10-CM | POA: Diagnosis not present

## 2016-09-25 LAB — LDL CHOLESTEROL, DIRECT: Direct LDL: 66 mg/dL

## 2016-09-28 ENCOUNTER — Encounter: Payer: Self-pay | Admitting: Internal Medicine

## 2016-09-28 ENCOUNTER — Ambulatory Visit (INDEPENDENT_AMBULATORY_CARE_PROVIDER_SITE_OTHER): Payer: Medicare Other | Admitting: Internal Medicine

## 2016-09-28 VITALS — BP 142/90 | HR 74 | Ht 69.0 in | Wt 168.0 lb

## 2016-09-28 DIAGNOSIS — Z86010 Personal history of colonic polyps: Secondary | ICD-10-CM | POA: Diagnosis not present

## 2016-09-28 DIAGNOSIS — R1310 Dysphagia, unspecified: Secondary | ICD-10-CM | POA: Diagnosis not present

## 2016-09-28 DIAGNOSIS — R1319 Other dysphagia: Secondary | ICD-10-CM

## 2016-09-28 DIAGNOSIS — K222 Esophageal obstruction: Secondary | ICD-10-CM | POA: Diagnosis not present

## 2016-09-28 NOTE — Patient Instructions (Signed)
   You have been scheduled for an endoscopy and colonoscopy. Please follow the written instructions given to you at your visit today. Please pick up your prep supplies at the pharmacy. If you use inhalers (even only as needed), please bring them with you on the day of your procedure. Your physician has requested that you go to www.startemmi.com and enter the access code given to you at your visit today. This web site gives a general overview about your procedure. However, you should still follow specific instructions given to you by our office regarding your preparation for the procedure.    I appreciate the opportunity to care for you. Carl [name], MD, FACG 

## 2016-09-28 NOTE — Progress Notes (Signed)
Parker Martinez 75 y.o. [birth year] [id number]  Assessment & Plan:  ` Encounter Diagnoses  Name Primary?  . Esophageal dysphagia Yes  . Esophageal stricture   . Hx of adenomatous colonic polyps     Schedule EGD and dilation  Schedule surveillance colonoscopy  The risks and benefits as well as alternatives of endoscopic procedure(s) have been discussed and reviewed. All questions answered. The patient agrees to proceed.  I appreciate the opportunity to care for this patient. CC: Ria Bush, MD    Subjective:   Chief Complaint: swallowing problems  HPI Very nice elderly white man with history of esophageal strictures and dilation in the past, last seen in October 2015, having recurrent dysphagia. He says symptoms are infrequent and mildly starting to have problems again. He had difficulty eating a piece of steak when he was at the beach [place], [place]) last week. No heartburn as long as he takes his PPI. Had 4 adenomas colonoscopy 3 years ago is due for surveillance. No other symptoms. No Known Allergies Outpatient Medications Prior to Visit  Medication Sig Dispense Refill  . albuterol (PROVENTIL HFA;VENTOLIN HFA) 108 (90 BASE) MCG/ACT inhaler Inhale 2 puffs into the lungs every 6 (six) hours as needed for wheezing. 6.7 g 3  . alfuzosin (UROXATRAL) 10 MG 24 hr tablet Take 10 mg by mouth daily with breakfast.    . aspirin 81 MG tablet Take 81 mg by mouth every morning.     . cyclobenzaprine (FLEXERIL) 10 MG tablet Take 10 mg by mouth 2 (two) times daily as needed for muscle spasms.    Marland Kitchen EPINEPHrine 0.3 mg/0.3 mL IJ SOAJ injection Inject 0.3 mLs (0.3 mg total) into the muscle once. 1 Device 1  . fexofenadine (ALLEGRA) 180 MG tablet Take 180 mg by mouth daily.    . fluticasone (FLONASE) 50 MCG/ACT nasal spray USE TWO SPRAYS IN EACH NOSTRIL DAILY 16 g 6  . HYDROcodone-acetaminophen (NORCO/VICODIN) 5-325 MG tablet TAKE 1 TABLET BY MOUTH EVERY 6 HOURS AS NEEDED FOR MODERATE PAIN  50 tablet 0  . mometasone-formoterol (DULERA) 200-5 MCG/ACT AERO Inhale 2 puffs into the lungs 2 (two) times daily. 13 g 6  . montelukast (SINGULAIR) 10 MG tablet TAKE ONE (1) TABLET BY MOUTH EVERY NIGHTAT BEDTIME 30 tablet 11  . Multiple Vitamin (MULTIVITAMIN) tablet Take 1 tablet by mouth daily.    . pantoprazole (PROTONIX) 40 MG tablet TAKE 1 TABLET BY MOUTH DAILY BEFORE BREAKFAST 30 tablet 1  . pravastatin (PRAVACHOL) 40 MG tablet Take 1 tablet (40 mg total) by mouth daily. 30 tablet 11  . ranitidine (ZANTAC) 150 MG tablet Take 150 mg by mouth daily.    Marland Kitchen SPIRIVA HANDIHALER 18 MCG inhalation capsule PLACE 1 CAPSULE INTO INHALER AND INHALE ONCE DAILY AS DIRECTED 30 capsule 6  . vitamin B-12 (CYANOCOBALAMIN) 500 MCG tablet Take 500 mcg by mouth every morning.      No facility-administered medications prior to visit.    Past Medical History:  Diagnosis Date  . Abnormal drug screen 10/2013, 03/2015   low Cr, +EtOH, neg hydrocodone (10/2013, again 03/2015)  . Allergic rhinitis   . Arthritis    lumbar and cervical spine  . Carotid stenosis 09/11/2014   40-59% bilat ICA stenosis, rpt 1 yr (09/2014)   . COPD (chronic obstructive pulmonary disease) (HCC)    severe, participated in [place] study  . DDD (degenerative disc disease)    lumbar (HNP R L3/4) s/p ESI, cervical  . Diverticulitis 2015  by CT  . Diverticulosis 2008   severe by colonoscopy and CT  . Duodenitis   . Esophageal stricture 2012   s/p dilation  . GERD (gastroesophageal reflux disease)   . Hemangioma    congenital left arm  . History of kidney stones remote  . HTN (hypertension)    borderline readings at home  . Idiopathic anaphylaxis    h/o recurrent anaphylaxis, unknown trigger, s/p eval at [facility] [phone]), last episode 03/2011  . PAD (peripheral artery disease) ([place])   . Personal history of colonic adenomas 09/02/2013  . Prostate cancer [facility]) 2011   prostate seed implant (Dr. [name])  . Smoker   .  Status post dilatation of esophageal stricture    Past Surgical History:  Procedure Laterality Date  . BALLOON DILATION N/A 10/02/2014   Procedure: BALLOON DILATION;  Surgeon: Gatha Mayer, MD;  Location: [facility];  Service: Endoscopy;  Laterality: N/A;  . COLONOSCOPY  08/2013   4 tubular adenomas, severe divertic, rpt 3 yrs [name])  . ESOPHAGOGASTRODUODENOSCOPY (EGD) WITH PROPOFOL N/A 10/02/2014   WNL, mild esophagitis; Gatha Mayer, MD  . INSERTION PROSTATE RADIATION SEED  01/2015   [name]  . SHOULDER SURGERY  1961   due to chronic dislocation  . SPIROMETRY  10/2010   severe obstruction, FEV1 41%, ratio 0.46  . TONSILLECTOMY  1944 ?  . UPPER GASTROINTESTINAL ENDOSCOPY  06/08/2009   dilated esophageal stricture, duodenitis  . virtual colonoscopy  2007   [name], rpt rec 5 yrs   Social History   Social History  . Marital status: Married    Spouse name: N/A  . Number of children: 1  . Years of education: N/A   Occupational History  . retired Retired   Social History Main Topics  . Smoking status: Current Every Day Smoker    Packs/day: 1.00    Years: 60.00    Types: Cigarettes  . Smokeless tobacco: Never Used     Comment: would like to quit not ready to. use vapor cig  . Alcohol use 7.2 oz/week    12 Cans of beer per week     Comment: socially  . Drug use: No  . Sexual activity: Not Asked   Other Topics Concern  . 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 Wife is Ladean Raya. "no extraordinary efforts" does not want feeding tube.      Tested negative for hydrocodone and positive for alcohol on UDS 10/2013.  Recommend Q6 mo testing and will need to discuss  EtOH use at next visit.   Family History  Problem Relation Age of Onset  . Lung cancer Father   . Hyperlipidemia Father   . Arthritis Mother   . Breast cancer Daughter   . Coronary artery disease Neg Hx   . Stroke Neg Hx   . Diabetes Neg Hx        Review of Systems Respiratory status is stable. He does continue to smoke.  Objective:   Physical Exam '@BP'$  (!) 142/90 Comment: Being followed by PCP. If stays elevated he is to call PCP  Pulse 74   Ht '5\' 9"'$  (1.753 m)  Wt 168 lb (76.2 kg)   BMI 24.81 kg/m @  General:  NAD Eyes:   anicteric Lungs:  Equal inspiration and expiration, fair to good air movement scattered wheezes Heart::  S1S2 no rubs, murmurs or gallops Abdomen:  soft and nontender, BS+ Ext:   no edema, cyanosis or clubbing    Data Reviewed:   As per history of present illness

## 2016-10-04 HISTORY — PX: COLONOSCOPY: SHX174

## 2016-10-04 HISTORY — PX: ESOPHAGOGASTRODUODENOSCOPY: SHX1529

## 2016-10-10 ENCOUNTER — Ambulatory Visit: Payer: Medicare Other

## 2016-10-10 DIAGNOSIS — I6523 Occlusion and stenosis of bilateral carotid arteries: Secondary | ICD-10-CM

## 2016-10-12 ENCOUNTER — Encounter: Payer: Self-pay | Admitting: Family Medicine

## 2016-10-12 DIAGNOSIS — G458 Other transient cerebral ischemic attacks and related syndromes: Secondary | ICD-10-CM

## 2016-10-12 HISTORY — DX: Other transient cerebral ischemic attacks and related syndromes: G45.8

## 2016-10-16 ENCOUNTER — Encounter: Payer: Self-pay | Admitting: Internal Medicine

## 2016-10-17 ENCOUNTER — Other Ambulatory Visit: Payer: Self-pay | Admitting: Internal Medicine

## 2016-10-30 ENCOUNTER — Encounter: Payer: Self-pay | Admitting: Internal Medicine

## 2016-10-30 ENCOUNTER — Ambulatory Visit (AMBULATORY_SURGERY_CENTER): Payer: Medicare Other | Admitting: Internal Medicine

## 2016-10-30 VITALS — BP 127/78 | HR 87 | Temp 98.7°F | Resp 9 | Ht 69.0 in | Wt 168.0 lb

## 2016-10-30 DIAGNOSIS — R1319 Other dysphagia: Secondary | ICD-10-CM

## 2016-10-30 DIAGNOSIS — Z86010 Personal history of colonic polyps: Secondary | ICD-10-CM

## 2016-10-30 DIAGNOSIS — R1310 Dysphagia, unspecified: Secondary | ICD-10-CM

## 2016-10-30 DIAGNOSIS — Q393 Congenital stenosis and stricture of esophagus: Secondary | ICD-10-CM

## 2016-10-30 DIAGNOSIS — K222 Esophageal obstruction: Secondary | ICD-10-CM | POA: Diagnosis not present

## 2016-10-30 MED ORDER — SODIUM CHLORIDE 0.9 % IV SOLN: 500.0000 mL | INTRAVENOUS | Status: DC

## 2016-10-30 NOTE — Progress Notes (Signed)
  [facility] Post-op Note  Patient: Parker Martinez  Procedure(s) Performed: colonoscopy and endoscopy with dilatation  Patient Location: [facility] - Recovery Area  Anesthesia Type: Deep Sedation/Propofol  Level of Consciousness: awake, oriented and patient cooperative  Airway and Oxygen Therapy: Patient Spontanous Breathing  Post-op Pain: none  Post-op Assessment:  Post-op Vital signs reviewed, Patient's Cardiovascular Status Stable, Respiratory Function Stable, Patent Airway, No signs of Nausea or vomiting and Pain level controlled  Post-op Vital Signs: Reviewed and stable  Complications: No apparent anesthesia complications  Djuan Talton E 2:27 PM

## 2016-10-30 NOTE — Patient Instructions (Addendum)
I dilated the esophagus again. It should help prevent swallowing problems. If they recur, let me know.  No polyps or cancer but I did see diverticulosis and hemorrhoids.  You do not need another routine colonoscopy!  I appreciate the opportunity to care for you. Gatha Mayer, MD, [facility]  Discharge instructions given. Handouts on diverticulosis and hemorrhoids.  Also a dilatation diet given. Resume previous medications. YOU HAD AN ENDOSCOPIC PROCEDURE TODAY AT [facility]:   Refer to the procedure report that was given to you for any specific questions about what was found during the examination.  If the procedure report does not answer your questions, please call your gastroenterologist to clarify.  If you requested that your care partner not be given the details of your procedure findings, then the procedure report has been included in a sealed envelope for you to review at your convenience later.  YOU SHOULD EXPECT: Some feelings of bloating in the abdomen. Passage of more gas than usual.  Walking can help get rid of the air that was put into your GI tract during the procedure and reduce the bloating. If you had a lower endoscopy (such as a colonoscopy or flexible sigmoidoscopy) you may notice spotting of blood in your stool or on the toilet paper. If you underwent a bowel prep for your procedure, you may not have a normal bowel movement for a few days.  Please Note:  You might notice some irritation and congestion in your nose or some drainage.  This is from the oxygen used during your procedure.  There is no need for concern and it should clear up in a day or so.  SYMPTOMS TO REPORT IMMEDIATELY:   Following lower endoscopy (colonoscopy or flexible sigmoidoscopy):  Excessive amounts of blood in the stool  Significant tenderness or worsening of abdominal pains  Swelling of the abdomen that is new, acute  Fever of 100F or higher   Following upper endoscopy  (EGD)  Vomiting of blood or coffee ground material  New chest pain or pain under the shoulder blades  Painful or persistently difficult swallowing  New shortness of breath  Fever of 100F or higher  Black, tarry-looking stools  For urgent or emergent issues, a gastroenterologist can be reached at any hour by calling [phone].   DIET:  We do recommend a small meal at first, but then you may proceed to your regular diet.  Drink plenty of fluids but you should avoid alcoholic beverages for 24 hours.  ACTIVITY:  You should plan to take it easy for the rest of today and you should NOT DRIVE or use heavy machinery until tomorrow (because of the sedation medicines used during the test).    FOLLOW UP: Our staff will call the number listed on your records the next business day following your procedure to check on you and address any questions or concerns that you may have regarding the information given to you following your procedure. If we do not reach you, we will leave a message.  However, if you are feeling well and you are not experiencing any problems, there is no need to return our call.  We will assume that you have returned to your regular daily activities without incident.  If any biopsies were taken you will be contacted by phone or by letter within the next 1-3 weeks.  Please call us at [phone] if you have not heard about the biopsies in 3 weeks.  SIGNATURES/CONFIDENTIALITY: You and/or your care partner have signed paperwork which will be entered into your electronic medical record.  These signatures attest to the fact that that the information above on your After Visit Summary has been reviewed and is understood.  Full responsibility of the confidentiality of this discharge information lies with you and/or your care-partner.

## 2016-10-30 NOTE — Progress Notes (Signed)
Called to room to assist during endoscopic procedure.  Patient ID and intended procedure confirmed with present staff. Received instructions for my participation in the procedure from the performing physician.  

## 2016-10-30 NOTE — Op Note (Signed)
[facility] Patient Name: Parker Martinez Procedure Date: 10/30/2016 1:47 PM MRN: [id number] Endoscopist: Gatha Mayer , MD Age: 75 Referring MD:  Date of Birth: 07/17/1941 Gender: Male Account #: [id number] Procedure:                Upper GI endoscopy Indications:              Dysphagia, Suspected stricture of the esophagus Medicines:                Propofol per Anesthesia, Monitored Anesthesia Care Procedure:                Pre-Anesthesia Assessment:                           - Prior to the procedure, a History and Physical                            was performed, and patient medications and                            allergies were reviewed. The patient's tolerance of                            previous anesthesia was also reviewed. The risks                            and benefits of the procedure and the sedation                            options and risks were discussed with the patient.                            All questions were answered, and informed consent                            was obtained. Prior Anticoagulants: The patient                            last took aspirin 1 day prior to the procedure. ASA                            Grade Assessment: II - A patient with mild systemic                            disease. After reviewing the risks and benefits,                            the patient was deemed in satisfactory condition to                            undergo the procedure.                           After obtaining informed consent, the endoscope was  passed under direct vision. Throughout the                            procedure, the patient's blood pressure, pulse, and                            oxygen saturations were monitored continuously. The                            Model GIF-HQ190 [id number]) scope was introduced                            through the mouth, and advanced to the second part         of duodenum. The upper GI endoscopy was                            accomplished without difficulty. The patient                            tolerated the procedure well. Scope In: Scope Out: Findings:                 two mild rings (acquired) were found in the distal                            esophagus. The scope was withdrawn. Dilation was                            performed with a Maloney dilator with mild                            resistance at 67 Fr. The dilation site was examined                            following endoscope reinsertion and showed complete                            resolution of luminal narrowing. Estimated blood                            loss was minimal.                           Diffuse mild inflammation characterized by erythema                            and granularity was found in the gastric antrum.                           The cardia and gastric fundus were normal on                            retroflexion.  The exam was otherwise without abnormality. Complications:            No immediate complications. Estimated Blood Loss:     Estimated blood loss was minimal. Impression:               - Mild rings x 2 distal esophagus. Dilated 54 Fr                            (18 mm)- in 2015 were dilated to 16.5 mm.                           - Gastritis.                           - The examination was otherwise normal.                           - No specimens collected. Recommendation:           - Patient has a contact number available for                            emergencies. The signs and symptoms of potential                            delayed complications were discussed with the                            patient. Return to normal activities tomorrow.                            Written discharge instructions were provided to the                            patient.                           - Clear liquids x 1 hour then soft  foods rest of                            day. Start prior diet tomorrow.                           - Continue present medications.                           - Resume aspirin at prior dose today.                           - See the other procedure note for documentation of                            additional recommendations.                           - Repeat dilation as needed - continue PPI and H2  blocker Gatha Mayer, MD 10/30/2016 2:28:51 PM This report has been signed electronically.

## 2016-10-30 NOTE — Op Note (Signed)
[place] Patient Name: Parker Martinez Procedure Date: 10/30/2016 1:47 PM MRN: [id number] Endoscopist: Gatha Mayer , MD Age: 75 Referring MD:  Date of Birth: 06/25/1941 Gender: Male Account #: [id number] Procedure:                Colonoscopy Indications:              Surveillance: Personal history of adenomatous                            polyps on last colonoscopy 3 years ago Medicines:                Propofol per Anesthesia, Monitored Anesthesia Care Procedure:                Pre-Anesthesia Assessment:                           - Prior to the procedure, a History and Physical                            was performed, and patient medications and                            allergies were reviewed. The patient's tolerance of                            previous anesthesia was also reviewed. The risks                            and benefits of the procedure and the sedation                            options and risks were discussed with the patient.                            All questions were answered, and informed consent                            was obtained. Prior Anticoagulants: The patient                            last took aspirin 2 days prior to the procedure.                            ASA Grade Assessment: II - A patient with mild                            systemic disease. After reviewing the risks and                            benefits, the patient was deemed in satisfactory                            condition to undergo the procedure.  After obtaining informed consent, the colonoscope                            was passed under direct vision. Throughout the                            procedure, the patient's blood pressure, pulse, and                            oxygen saturations were monitored continuously. The                            Model CF-HQ190L [id number]) scope was introduced                            through the  anus and advanced to the the cecum,                            identified by appendiceal orifice and ileocecal                            valve. The colonoscopy was performed without                            difficulty. The patient tolerated the procedure                            well. The quality of the bowel preparation was                            good. The bowel preparation used was Miralax. The                            ileocecal valve, appendiceal orifice, and rectum                            were photographed. Scope In: 2:06:34 PM Scope Out: 2:21:15 PM Scope Withdrawal Time: 0 hours 11 minutes 50 seconds  Total Procedure Duration: 0 hours 14 minutes 41 seconds  Findings:                 The perianal and digital rectal examinations were                            normal.                           Multiple diverticula were found in the sigmoid                            colon. There was narrowing of the colon in                            association with the diverticular opening.  Internal hemorrhoids were found during retroflexion.                           The exam was otherwise without abnormality on                            direct and retroflexion views. Complications:            No immediate complications. Estimated Blood Loss:     Estimated blood loss: none. Impression:               - Severe diverticulosis in the sigmoid colon. There                            was narrowing of the colon in association with the                            diverticular opening.                           - Internal hemorrhoids.                           - The examination was otherwise normal on direct                            and retroflexion views.                           - No specimens collected.                           - Personal history of colonic polyps. 3small                            adenomas 2014 Recommendation:           - Patient has a  contact number available for                            emergencies. The signs and symptoms of potential                            delayed complications were discussed with the                            patient. Return to normal activities tomorrow.                            Written discharge instructions were provided to the                            patient.                           - Resume previous diet.                           -  Continue present medications.                           - No repeat colonoscopy due to age. Gatha Mayer, MD 10/30/2016 2:37:01 PM This report has been signed electronically.

## 2016-10-31 ENCOUNTER — Telehealth: Payer: Self-pay

## 2016-10-31 NOTE — Telephone Encounter (Signed)
  Follow up Call-  Call back number 10/30/2016  Post procedure Call Back phone  # [phone]  Permission to leave phone message Yes  Some recent data might be hidden     Patient questions:  Do you have a fever, pain , or abdominal swelling? No. Pain Score  0 *  Have you tolerated food without any problems? No.  Have you been able to return to your normal activities? Yes.    Do you have any questions about your discharge instructions: Diet   No. Medications  No. Follow up visit  No.  Do you have questions or concerns about your Care? Yes.    Actions: * If pain score is 4 or above: No action needed, pain <4.   Per pt he had his esophagus dilated yesterday and he reported he had a scratchy sore throat yesterday.  He said he did not eat yesterday either.  He said warm coffee felt the best on his throat.  I explained this was normal post dilation of esophagus.  Try warm water gargles.  OTC sucret loz if needed.  He said he was going to eat this am.  I recommended to start with something soft first, then advance to regular diet to as tolerated.  Pt said he will do this and will call if needed.  maw

## 2016-11-01 ENCOUNTER — Encounter: Payer: Self-pay | Admitting: Family Medicine

## 2016-11-03 ENCOUNTER — Encounter: Payer: Self-pay | Admitting: Family Medicine

## 2016-11-08 ENCOUNTER — Other Ambulatory Visit: Payer: Self-pay | Admitting: *Deleted

## 2016-11-08 MED ORDER — HYDROCODONE-ACETAMINOPHEN 5-325 MG PO TABS: ORAL_TABLET | ORAL | 0 refills | Status: DC

## 2016-11-08 NOTE — Telephone Encounter (Signed)
Ok to refill in Dr. Synthia Innocent absence? Last filled 07/24/16 #50 0RF. Please give to me. Thanks!

## 2016-11-08 NOTE — Telephone Encounter (Signed)
Patient notified and Rx placed up front for pick up. 

## 2016-11-08 NOTE — Telephone Encounter (Signed)
Refilled times on in PCP absence  Will route to [employer]

## 2016-11-30 ENCOUNTER — Other Ambulatory Visit: Payer: Self-pay | Admitting: Family Medicine

## 2017-01-12 ENCOUNTER — Telehealth: Payer: Self-pay | Admitting: *Deleted

## 2017-01-12 NOTE — Telephone Encounter (Signed)
PA for [facility] in your IN box for completion.

## 2017-01-12 NOTE — Telephone Encounter (Signed)
Filled and in [name]'s box. 

## 2017-01-15 NOTE — Telephone Encounter (Signed)
Form faxed. Will await determination. 

## 2017-01-16 NOTE — Telephone Encounter (Signed)
Additional info required. Form in your IN box

## 2017-01-17 MED ORDER — FLUTICASONE FUROATE-VILANTEROL 200-25 MCG/INH IN AEPB: 1.0000 | INHALATION_SPRAY | Freq: Every day | RESPIRATORY_TRACT | 11 refills | Status: DC

## 2017-01-17 NOTE — Addendum Note (Signed)
Addended by: Ria Bush on: 01/17/2017 09:06 AM   Modules accepted: Orders

## 2017-01-17 NOTE — Telephone Encounter (Signed)
Filled and in Kims' box. Will likely need switch due to formulary change - will send in breo in case dulera is denied.

## 2017-01-17 NOTE — Telephone Encounter (Signed)
Form faxed

## 2017-01-18 NOTE — Telephone Encounter (Signed)
PA denied. [employer] has already been sent in for replacement.

## 2017-01-25 ENCOUNTER — Other Ambulatory Visit: Payer: Self-pay | Admitting: Family Medicine

## 2017-01-30 ENCOUNTER — Ambulatory Visit (INDEPENDENT_AMBULATORY_CARE_PROVIDER_SITE_OTHER): Payer: PPO | Admitting: Family Medicine

## 2017-01-30 ENCOUNTER — Encounter: Payer: Self-pay | Admitting: Family Medicine

## 2017-01-30 ENCOUNTER — Ambulatory Visit (INDEPENDENT_AMBULATORY_CARE_PROVIDER_SITE_OTHER)
Admission: RE | Admit: 2017-01-30 | Discharge: 2017-01-30 | Disposition: A | Discharge status: Home / Self Care | Payer: PPO | Source: Ambulatory Visit | Attending: Family Medicine | Admitting: Family Medicine

## 2017-01-30 VITALS — BP 130/60 | HR 120 | Temp 98.1°F | Wt 164.0 lb

## 2017-01-30 DIAGNOSIS — M25512 Pain in left shoulder: Secondary | ICD-10-CM | POA: Diagnosis not present

## 2017-01-30 DIAGNOSIS — G8929 Other chronic pain: Secondary | ICD-10-CM | POA: Insufficient documentation

## 2017-01-30 MED ORDER — HYDROCODONE-ACETAMINOPHEN 5-325 MG PO TABS: ORAL_TABLET | ORAL | 0 refills | Status: DC

## 2017-01-30 NOTE — Progress Notes (Signed)
BP 130/60   Pulse (!) 120   Temp 98.1 F (36.7 C) (Oral)   Wt 164 lb (74.4 kg)   SpO2 95%   BMI 24.22 kg/m    CC: L shoulder pain Subjective:    Patient ID: Parker Martinez, male    DOB: Oct 21, 1941, 76 y.o.   MRN: [id number]  HPI: Parker Martinez is a 76 y.o. male presenting on 01/30/2017 for Shoulder Pain (left; x2 weeks; no injury)   2 wk h/o shoulder pain that woke him up from sleep with sharp stabbing pain anterior L shoulder. Pain travels from shoulder to lateral elbow. Worse with certain movements ie putting on shirt. L arm easily falls asleep.   Denies inciting trauma/injury or falls, neck pain.  Treating with hydrocodone which helps him rest better, but doesn't help pain with movements. Did not tolerate ibuprofen - foggy headed.  Remote L chronic shoulder dislocation s/p shoulder surgery 1961 done to limit ROM of L shoulder.   H/o R RTC tear, never had this repaired.   Relevant past medical, surgical, family and social history reviewed and updated as indicated. Interim medical history since our last visit reviewed. Allergies and medications reviewed and updated. Outpatient Medications Prior to Visit  Medication Sig Dispense Refill  . alfuzosin (UROXATRAL) 10 MG 24 hr tablet Take 10 mg by mouth daily with breakfast.    . aspirin 81 MG tablet Take 81 mg by mouth every morning.     . cyclobenzaprine (FLEXERIL) 10 MG tablet Take 10 mg by mouth 2 (two) times daily as needed for muscle spasms.    Marland Kitchen EPINEPHrine 0.3 mg/0.3 mL IJ SOAJ injection Inject 0.3 mLs (0.3 mg total) into the muscle once. 1 Device 1  . fexofenadine (ALLEGRA) 180 MG tablet Take 180 mg by mouth daily.    . fluticasone (FLONASE) 50 MCG/ACT nasal spray USE TWO SPRAYS IN EACH NOSTRIL DAILY 16 g 6  . mometasone-formoterol (DULERA) 200-5 MCG/ACT AERO Inhale 2 puffs into the lungs 2 (two) times daily. 13 g 6  . montelukast (SINGULAIR) 10 MG tablet TAKE ONE TABLET BY MOUTH EVERY NIGHT AT BEDTIME 30 tablet 6  .  Multiple Vitamin (MULTIVITAMIN) tablet Take 1 tablet by mouth daily.    . pantoprazole (PROTONIX) 40 MG tablet TAKE 1 TABLET BY MOUTH DAILY BEFORE BREAKFAST. 30 tablet 11  . pravastatin (PRAVACHOL) 40 MG tablet Take 1 tablet (40 mg total) by mouth daily. 30 tablet 11  . ranitidine (ZANTAC) 150 MG tablet Take 150 mg by mouth daily.    Marland Kitchen SPIRIVA HANDIHALER 18 MCG inhalation capsule PLACE 1 CAPSULE INTO INHALER AND INHALE ONCE DAILY AS DIRECTED 30 capsule 6  . VENTOLIN HFA 108 (90 Base) MCG/ACT inhaler USE 2 PUFFS EVERY 6 HOURS AS NEEDED FOR WHEEZING 18 g 3  . vitamin B-12 (CYANOCOBALAMIN) 500 MCG tablet Take 500 mcg by mouth every morning.     Marland Kitchen HYDROcodone-acetaminophen (NORCO/VICODIN) 5-325 MG tablet TAKE 1 TABLET BY MOUTH EVERY 6 HOURS AS NEEDED FOR MODERATE PAIN 50 tablet 0  . fluticasone furoate-vilanterol (BREO ELLIPTA) 200-25 MCG/INH AEPB Inhale 1 puff into the lungs daily. (Patient not taking: Reported on 01/30/2017) 28 each 11   Facility-Administered Medications Prior to Visit  Medication Dose Route Frequency Provider Last Rate Last Dose  . 0.9 %  sodium chloride infusion  500 mL Intravenous Continuous Gatha Mayer, MD        Past Medical History:  Diagnosis Date  . Abnormal drug screen 10/2013,  03/2015   low Cr, +EtOH, neg hydrocodone (10/2013, again 03/2015)  . Allergic rhinitis   . Arthritis    lumbar and cervical spine  . Carotid stenosis 09/11/2014   40-59% bilat ICA stenosis, rpt 1 yr (09/2014)   . COPD (chronic obstructive pulmonary disease) (HCC)    severe, participated in [place] study  . DDD (degenerative disc disease)    lumbar (HNP R L3/4) s/p ESI, cervical  . Diverticulitis 2015   by CT  . Diverticulosis 2008   severe by colonoscopy and CT  . Duodenitis   . Esophageal stricture 2012   s/p dilation  . GERD (gastroesophageal reflux disease)   . Hemangioma    congenital left arm  . History of kidney stones remote  . HTN (hypertension)    borderline readings at home    . Idiopathic anaphylaxis    h/o recurrent anaphylaxis, unknown trigger, s/p eval at [place] [id number]), last episode 03/2011  . PAD (peripheral artery disease) ([place])   . Personal history of colonic adenomas 09/02/2013  . Prostate cancer [facility]) 2011   prostate seed implant (Dr. [name])  . Smoker   . Status post dilatation of esophageal stricture   . Subclavian steal syndrome 10/12/2016   Left subclavian steal, with 18 mmHg brachial artery pressure gradient (10/2016). If symptomatic, rec PV consult    Past Surgical History:  Procedure Laterality Date  . BALLOON DILATION N/A 10/02/2014   Procedure: BALLOON DILATION;  Surgeon: Gatha Mayer, MD;  Location: [facility];  Service: Endoscopy;  Laterality: N/A;  . COLONOSCOPY  08/2013   4 tubular adenomas, severe divertic, rpt 3 yrs [name])  . COLONOSCOPY  10/2016   severe diverticulosis, no f/u recommended [name])  . ESOPHAGOGASTRODUODENOSCOPY  10/2016   2 rings dilated [name])  . ESOPHAGOGASTRODUODENOSCOPY (EGD) WITH PROPOFOL N/A 10/02/2014   WNL, mild esophagitis; Gatha Mayer, MD  . INSERTION PROSTATE RADIATION SEED  01/2015   [name]  . SHOULDER SURGERY  1961   due to chronic dislocation  . SPIROMETRY  10/2010   severe obstruction, FEV1 41%, ratio 0.46  . TONSILLECTOMY  1944 ?  . UPPER GASTROINTESTINAL ENDOSCOPY  06/08/2009   dilated esophageal stricture, duodenitis  . virtual colonoscopy  2007   [name], rpt rec 5 yrs    Per HPI unless specifically indicated in ROS section below Review of Systems     Objective:    BP 130/60   Pulse (!) 120   Temp 98.1 F (36.7 C) (Oral)   Wt 164 lb (74.4 kg)   SpO2 95%   BMI 24.22 kg/m   Wt Readings from Last 3 Encounters:  01/30/17 164 lb (74.4 kg)  10/30/16 168 lb (76.2 kg)  09/28/16 168 lb (76.2 kg)    Physical Exam  Constitutional: He appears well-developed and well-nourished. No distress.  Musculoskeletal: He exhibits no edema.  R shoulder with stiffness  from known arthritis and old RTC tear L shoulder exam: No significant pain with palpation of shoulder landmarks. LROM in abduction and forward flexion - active >> passive. Unable to abduct left shoulder past 60 degrees Painful with testing SITS in internal/external rotation Neg speed test Discomfort with rotation of humeral head in [place] joint.   Skin: Skin is warm and dry. No rash noted.  Nursing note and vitals reviewed.  Results for orders placed or performed in visit on 09/25/16  LDL cholesterol, direct  Result Value Ref Range   Direct LDL 66.0 mg/dL  Assessment & Plan:   Problem List Items Addressed This Visit    Acute pain of left shoulder - Primary    In h/o remote shoulder dislocation, check xray today to eval for recurrence - I don't see fracture or dislocation today.  Possible RTC tendonitis vs tear - will refill hydrocodone, recommend pendulum swinging exercises.  Given remote h/o surgery for recurrent chronic dislocations, will refer to ortho for further eval. Pt agrees with plan. Avoid NSAIDs in h/o duodenitis, GERD.       Relevant Orders   DG Shoulder Left   Ambulatory referral to Orthopedic Surgery       Follow up plan: Return if symptoms worsen or fail to improve.  Ria Bush, MD

## 2017-01-30 NOTE — Patient Instructions (Signed)
xrays today. Use heating pad or ice onto shoulder for 10-15 min - whichever soothes better. Do pendulum exercises provided today.  We will refer you to ortho for evaluation of shoulder pain

## 2017-01-30 NOTE — Assessment & Plan Note (Addendum)
In h/o remote shoulder dislocation, check xray today to eval for recurrence - I don't see fracture or dislocation today.  Possible RTC tendonitis vs tear - will refill hydrocodone, recommend pendulum swinging exercises.  Given remote h/o surgery for recurrent chronic dislocations, will refer to ortho for further eval. Pt agrees with plan. Avoid NSAIDs in h/o duodenitis, GERD.

## 2017-01-30 NOTE — Progress Notes (Signed)
Pre visit review using our clinic review tool, if applicable. No additional management support is needed unless otherwise documented below in the visit note. 

## 2017-02-05 DIAGNOSIS — M542 Cervicalgia: Secondary | ICD-10-CM | POA: Diagnosis not present

## 2017-02-05 DIAGNOSIS — M25512 Pain in left shoulder: Secondary | ICD-10-CM | POA: Diagnosis not present

## 2017-02-06 ENCOUNTER — Other Ambulatory Visit: Payer: Self-pay | Admitting: *Deleted

## 2017-02-06 MED ORDER — FLUTICASONE FUROATE-VILANTEROL 200-25 MCG/INH IN AEPB: 1.0000 | INHALATION_SPRAY | Freq: Every day | RESPIRATORY_TRACT | 6 refills | Status: DC

## 2017-03-05 DIAGNOSIS — M25512 Pain in left shoulder: Secondary | ICD-10-CM | POA: Diagnosis not present

## 2017-03-05 DIAGNOSIS — M542 Cervicalgia: Secondary | ICD-10-CM | POA: Diagnosis not present

## 2017-03-29 ENCOUNTER — Other Ambulatory Visit: Payer: Self-pay | Admitting: Family Medicine

## 2017-04-02 DIAGNOSIS — C61 Malignant neoplasm of prostate: Secondary | ICD-10-CM | POA: Diagnosis not present

## 2017-04-02 DIAGNOSIS — R3915 Urgency of urination: Secondary | ICD-10-CM | POA: Diagnosis not present

## 2017-04-02 DIAGNOSIS — N320 Bladder-neck obstruction: Secondary | ICD-10-CM | POA: Diagnosis not present

## 2017-05-02 ENCOUNTER — Ambulatory Visit: Payer: Medicare Other | Admitting: Vascular Surgery

## 2017-05-02 ENCOUNTER — Encounter (HOSPITAL_COMMUNITY): Payer: Medicare Other

## 2017-05-15 ENCOUNTER — Other Ambulatory Visit: Payer: Self-pay | Admitting: Family Medicine

## 2017-05-15 DIAGNOSIS — G8929 Other chronic pain: Secondary | ICD-10-CM | POA: Insufficient documentation

## 2017-05-15 NOTE — Telephone Encounter (Signed)
Printed and in [place] box. [place] CSRS reviewed.

## 2017-05-15 NOTE — Telephone Encounter (Signed)
Electronic refill request. Last office visit:   09/20/16 CPE Last Filled:    50 tablet 0 01/30/2017  Please advise.

## 2017-05-16 NOTE — Telephone Encounter (Signed)
Patient notified by telephone that script is up front ready for pickup. 

## 2017-05-21 ENCOUNTER — Other Ambulatory Visit: Payer: Self-pay | Admitting: *Deleted

## 2017-05-21 NOTE — Patient Outreach (Signed)
Pt sees primary care MD routinely.  Has COPD (still smokes but states he is trying to quit), hx of prostate cancer, HTN.. Takes medications as ordered for chronic problems.  Reports he was on [facility] and was having good results with this inhaler but the plan doesn't cover this and he had to change to [facility]. He thinks the [place] worked better for him.  No acute health concerns. No care management needs. Introductory letter sent.  I am going to inquire if there are any opportunities for this member to get the [facility] covered or at a discount.  Eulah Pont. Myrtie Neither, MSN, [facility] Gerontological Nurse Practitioner [facility] Care Management [phone]

## 2017-05-29 ENCOUNTER — Other Ambulatory Visit: Payer: Self-pay | Admitting: Pharmacist

## 2017-05-29 NOTE — Patient Outreach (Signed)
[facility] Care Management  05/29/2017  Parker Martinez May 08, 1941 [id number]  Patient was referred to [place] Pharmacist by [facility] GNP, Kayleen Memos for review of patient assistance options for [facility].   Successful phone outreach to patient, HIPAA details verified.  Patient reports he previously used [place] and switched to Memory Dance this year due to insurance formulary.  He reports he doesn't feel Memory Dance works as well as Dentist did.    Review of chart shows MD office attempted to get insurance coverage of Dulera around 01/2017 and it was denied.   Discussed [employer] Patient assistance program for obtaining Dulera---patient reports income exceeds requirements.   Patient also reports income exceeds requirement for Social Security Extra Help program.  Per [place] of [place], appears Advair, Symbicort, Memory Dance are covered as Tier 3 medications.   Patient reports he prefers to discuss alternatives to [facility] with his PCP himself.  He denies other pharmacy related questions/concerns at this time.   Of note, Ruthe Mannan is an inhalation aerosol as is Symbicort.  Breo and Advair Diskus are both inhalation powders.   Plan:  Will not open patient's case as he denies questions/concerns or other pharmacy needs.   Will route note to PCP.   If therapeutically appropriate, consideration could be given to Symbicort which has a drug delivery system closer to [facility] than Breo.    Karrie Meres, PharmD, [place] [phone]

## 2017-06-04 ENCOUNTER — Encounter: Payer: Self-pay | Admitting: Vascular Surgery

## 2017-06-11 ENCOUNTER — Other Ambulatory Visit: Payer: Self-pay

## 2017-06-11 DIAGNOSIS — I739 Peripheral vascular disease, unspecified: Secondary | ICD-10-CM

## 2017-06-14 ENCOUNTER — Encounter: Payer: Self-pay | Admitting: Vascular Surgery

## 2017-06-14 ENCOUNTER — Ambulatory Visit (INDEPENDENT_AMBULATORY_CARE_PROVIDER_SITE_OTHER): Payer: PPO | Admitting: Vascular Surgery

## 2017-06-14 ENCOUNTER — Ambulatory Visit (HOSPITAL_COMMUNITY)
Admission: RE | Admit: 2017-06-14 | Discharge: 2017-06-14 | Disposition: A | Discharge status: Home / Self Care | Payer: PPO | Source: Ambulatory Visit | Attending: Vascular Surgery | Admitting: Vascular Surgery

## 2017-06-14 VITALS — BP 122/67 | HR 84 | Temp 97.9°F | Resp 20 | Ht 69.0 in | Wt 156.4 lb

## 2017-06-14 DIAGNOSIS — I739 Peripheral vascular disease, unspecified: Secondary | ICD-10-CM | POA: Insufficient documentation

## 2017-06-14 DIAGNOSIS — I70219 Atherosclerosis of native arteries of extremities with intermittent claudication, unspecified extremity: Secondary | ICD-10-CM | POA: Diagnosis not present

## 2017-06-14 DIAGNOSIS — I6521 Occlusion and stenosis of right carotid artery: Secondary | ICD-10-CM | POA: Diagnosis not present

## 2017-06-14 NOTE — Progress Notes (Signed)
Patient name: Parker Martinez MRN: [id number] DOB: September 16, 1941 Sex: male  REASON FOR VISIT:    Follow up of carotid disease and peripheral vascular disease.  HPI:   Parker Martinez is a pleasant 76 y.o. male who I last saw on 11/03/2015. At that time, he had stable claudication of the right lower extremity. He had asymptomatic carotid disease with a 40-59% right carotid stenosis and a less than 39% left carotid stenosis. His blood pressure was elevated at the time of that visit. He comes in for a 1 year follow up visit.  Since I saw him last, he has claudication of oh lower extremity's which is more significant on the right side. He experiences pain in the calves which is brought on by ambulation and relieved with rest. On the right side this occurs at short distance of less than 1 city block. He thinks that over the last year his symptoms have gradually progressed. He denies any significant thigh claudication. He denies any history of rest pain. He denies any history of nonhealing ulcers.  He does continue to smoke a half a pack per day of cigarettes. He has been smoking for 60 years.  He denies any history of stroke, TIAs, expressive or receptive aphasia, or amaurosis fugax.  Past Medical History:  Diagnosis Date  . Abnormal drug screen 10/2013, 03/2015   low Cr, +EtOH, neg hydrocodone (10/2013, again 03/2015)  . Allergic rhinitis   . Arthritis    lumbar and cervical spine  . Carotid stenosis 09/11/2014   40-59% bilat ICA stenosis, rpt 1 yr (09/2014)   . COPD (chronic obstructive pulmonary disease) (HCC)    severe, participated in [place] study  . DDD (degenerative disc disease)    lumbar (HNP R L3/4) s/p ESI, cervical  . Diverticulitis 2015   by CT  . Diverticulosis 2008   severe by colonoscopy and CT  . Duodenitis   . Esophageal stricture 2012   s/p dilation  . GERD (gastroesophageal reflux disease)   . Hemangioma    congenital left arm  . History of kidney stones remote  .  HTN (hypertension)    borderline readings at home  . Idiopathic anaphylaxis    h/o recurrent anaphylaxis, unknown trigger, s/p eval at [place] [phone]), last episode 03/2011  . PAD (peripheral artery disease) ([place])   . Personal history of colonic adenomas 09/02/2013  . Prostate cancer [facility]) 2011   prostate seed implant (Dr. [name])  . Smoker   . Status post dilatation of esophageal stricture   . Subclavian steal syndrome 10/12/2016   Left subclavian steal, with 18 mmHg brachial artery pressure gradient (10/2016). If symptomatic, rec PV consult    Family History  Problem Relation Age of Onset  . Lung cancer Father   . Hyperlipidemia Father   . Arthritis Mother   . Breast cancer Daughter   . Coronary artery disease Neg Hx   . Stroke Neg Hx   . Diabetes Neg Hx     SOCIAL HISTORY: Social History  Substance Use Topics  . Smoking status: Current Every Day Smoker    Packs/day: 1.00    Years: 60.00    Types: Cigarettes  . Smokeless tobacco: Never Used     Comment: would like to quit not ready to. use vapor cig  . Alcohol use 7.2 oz/week    12 Cans of beer per week     Comment: socially    No Known Allergies  Current Outpatient  Prescriptions  Medication Sig Dispense Refill  . alfuzosin (UROXATRAL) 10 MG 24 hr tablet Take 10 mg by mouth daily with breakfast.    . aspirin 81 MG tablet Take 81 mg by mouth every morning.     . cyclobenzaprine (FLEXERIL) 10 MG tablet Take 10 mg by mouth 2 (two) times daily as needed for muscle spasms.    Marland Kitchen EPINEPHrine 0.3 mg/0.3 mL IJ SOAJ injection Inject 0.3 mLs (0.3 mg total) into the muscle once. 1 Device 1  . fexofenadine (ALLEGRA) 180 MG tablet Take 180 mg by mouth daily.    . fluticasone (FLONASE) 50 MCG/ACT nasal spray USE TWO SPRAYS IN EACH NOSTRIL DAILY 16 g 6  . fluticasone furoate-vilanterol (BREO ELLIPTA) 200-25 MCG/INH AEPB Inhale 1 puff into the lungs daily. 60 each 6  . HYDROcodone-acetaminophen (NORCO/VICODIN)  5-325 MG tablet TAKE ONE TABLET BY MOUTH EVERY 6 HOURS AS NEEDED FOR MODERATE PAIN 50 tablet 0  . mometasone-formoterol (DULERA) 200-5 MCG/ACT AERO Inhale 2 puffs into the lungs 2 (two) times daily. 13 g 6  . montelukast (SINGULAIR) 10 MG tablet TAKE ONE TABLET BY MOUTH EVERY NIGHT AT BEDTIME 30 tablet 6  . Multiple Vitamin (MULTIVITAMIN) tablet Take 1 tablet by mouth daily.    . pantoprazole (PROTONIX) 40 MG tablet TAKE 1 TABLET BY MOUTH DAILY BEFORE BREAKFAST. 30 tablet 11  . pravastatin (PRAVACHOL) 40 MG tablet TAKE 1 TABLET BY MOUTH DAILY 30 tablet 6  . ranitidine (ZANTAC) 150 MG tablet Take 150 mg by mouth daily.    Marland Kitchen SPIRIVA HANDIHALER 18 MCG inhalation capsule PLACE 1 CAPSULE INTO INHALER AND INHALE ONCE DAILY AS DIRECTED 30 capsule 6  . VENTOLIN HFA 108 (90 Base) MCG/ACT inhaler USE 2 PUFFS EVERY 6 HOURS AS NEEDED FOR WHEEZING 18 g 3  . vitamin B-12 (CYANOCOBALAMIN) 500 MCG tablet Take 500 mcg by mouth every morning.      Current Facility-Administered Medications  Medication Dose Route Frequency Provider Last Rate Last Dose  . 0.9 %  sodium chloride infusion  500 mL Intravenous Continuous Gatha Mayer, MD        REVIEW OF SYSTEMS:  [X]  denotes positive finding, [ ]  denotes negative finding Cardiac  Comments:  Chest pain or chest pressure:    Shortness of breath upon exertion: X   Short of breath when lying flat: X   Irregular heart rhythm:        Vascular    Pain in calf, thigh, or hip brought on by ambulation: X   Pain in feet at night that wakes you up from your sleep:  X   Blood clot in your veins:    Leg swelling:         Pulmonary    Oxygen at home:    Productive cough:  X   Wheezing:  X       Neurologic    Sudden weakness in arms or legs:     Sudden numbness in arms or legs:     Sudden onset of difficulty speaking or slurred speech:    Temporary loss of vision in one eye:     Problems with dizziness:  X       Gastrointestinal    Blood in stool:     Vomited  blood:         Genitourinary    Burning when urinating:     Blood in urine:        Psychiatric    Major depression:  Hematologic    Bleeding problems:    Problems with blood clotting too easily:        Skin    Rashes or ulcers:        Constitutional    Fever or chills:     PHYSICAL EXAM:   Vitals:   06/14/17 1122  BP: 122/67  Pulse: 84  Resp: 20  Temp: 97.9 F (36.6 C)  TempSrc: Oral  SpO2: 97%  Weight: 156 lb 6.4 oz (70.9 kg)  Height: 5\' 9"  (1.753 m)    GENERAL: The patient is a well-nourished male, in no acute distress. The vital signs are documented above. CARDIAC: There is a regular rate and rhythm.  VASCULAR: He has a right carotid bruit. On the right side, I cannot palpate a femoral pulse. I cannot palpate popliteal or pedal pulses. On the left side, he has a diminished femoral pulse. I cannot palpate popliteal or pedal pulses. He has no significant lower extremity swelling. PULMONARY: There is good air exchange bilaterally without wheezing or rales. ABDOMEN: Soft and non-tender with normal pitched bowel sounds. I cannot palpate an abdominal aortic aneurysm. MUSCULOSKELETAL: There are no major deformities or cyanosis. NEUROLOGIC: No focal weakness or paresthesias are detected. SKIN: There are no ulcers or rashes noted. PSYCHIATRIC: The patient has a normal affect.  DATA:    ARTERIAL DOPPLER STUDY: I have independently interpreted his arterial Doppler study today.  On the right side there is a monophasic dorsalis pedis and posterior tibial signal with an ABI of 48%. To pressure on the right is 31 mmHg.  On the left side there is a biphasic posterior tibial signal and dorsalis pedis signal. ABI on the left is 59%. To pressure is 66 mmHg.  His ABIs are down slightly compared to the study back in November 2016.  CAROTID DUPLEX: His last carotid duplex scan was done in [facility] in November 2017. This showed a 4050% right carotid  stenosis with a less than 39% left carotid stenosis. He would be due for a follow up carotid duplex scan in November 2018.  MEDICAL ISSUES:   MULTILEVEL ARTERIAL OCCLUSIVE DISEASE: His claudication symptoms on the left are new. His claudication symptoms on the right have progressed since I saw him last. He has evidence of both aortoiliac occlusive disease and infrainguinal arterial occlusive disease. I have discussed with him the importance of tobacco cessation. I have encouraged him to stay as active as possible. We've also discussed the importance of nutrition. He is on aspirin.  HISTORY OF RIGHT CAROTID STENOSIS: His most recent duplex scan shows a 40-59% right carotid stenosis. He understands we would not consider right carotid endarterectomy unless the stenosis progressed to greater than 80%. He is asymptomatic. His carotid studies are done in [place] and he is due for a follow up study in November 2018. He is on aspirin.  HYPERTENSION: His blood pressure appears to be under good control.  TOBACCO USE: I have again counseled him on the importance of tobacco cessation.   Deitra Mayo [facility] [phone]

## 2017-06-18 NOTE — Addendum Note (Signed)
Addended by: Lianne Cure A on: 06/18/2017 03:39 PM   Modules accepted: Orders

## 2017-07-29 ENCOUNTER — Telehealth: Payer: Self-pay | Admitting: Family Medicine

## 2017-07-29 MED ORDER — BUDESONIDE-FORMOTEROL FUMARATE 160-4.5 MCG/ACT IN AERO: 2.0000 | INHALATION_SPRAY | Freq: Two times a day (BID) | RESPIRATORY_TRACT | 12 refills | Status: DC

## 2017-07-29 NOTE — Telephone Encounter (Signed)
Received message from [facility] pharmacist. Pt feels breo not as effective as dulera. dulera wasn't covered by insurance, unaffordable.  plz notify patient - I want him to try symbicort in place of breo - sent in.

## 2017-07-30 NOTE — Telephone Encounter (Signed)
Patient returned [facility] call.  Patient can be reached at [phone].

## 2017-07-30 NOTE — Telephone Encounter (Signed)
Lm on pts vm requesting a call back 

## 2017-07-30 NOTE — Telephone Encounter (Signed)
Spoke to pt and advised; states he will pickup new med

## 2017-07-31 NOTE — Telephone Encounter (Signed)
Patient called stating that he is confused about the new medication sent in for him to replace the [facility]. Patient stated that he thought that it was Singulair and he is already on that. Advised patient that Dr. [name] wants him to try Symbicort in place of Breo. Patient verbalized understanding.

## 2017-08-21 ENCOUNTER — Other Ambulatory Visit: Payer: Self-pay | Admitting: Family Medicine

## 2017-08-21 DIAGNOSIS — G8929 Other chronic pain: Secondary | ICD-10-CM

## 2017-08-21 NOTE — Telephone Encounter (Signed)
Printed and in [facility] box 

## 2017-08-21 NOTE — Telephone Encounter (Signed)
Last filled 05/16/17 #50, last OV 01/30/17 next OV none

## 2017-08-21 NOTE — Telephone Encounter (Signed)
Spoke with pt, notified him rx is at front office ready to pick up.

## 2017-09-06 ENCOUNTER — Other Ambulatory Visit: Payer: Self-pay | Admitting: Family Medicine

## 2017-09-11 ENCOUNTER — Other Ambulatory Visit: Payer: Self-pay

## 2017-09-11 MED ORDER — CYCLOBENZAPRINE HCL 10 MG PO TABS: 10.0000 mg | ORAL_TABLET | Freq: Two times a day (BID) | ORAL | 0 refills | Status: DC | PRN

## 2017-09-14 ENCOUNTER — Ambulatory Visit (INDEPENDENT_AMBULATORY_CARE_PROVIDER_SITE_OTHER): Payer: PPO

## 2017-09-14 DIAGNOSIS — Z23 Encounter for immunization: Secondary | ICD-10-CM

## 2017-09-18 ENCOUNTER — Other Ambulatory Visit: Payer: Self-pay | Admitting: Family Medicine

## 2017-10-01 DIAGNOSIS — R9721 Rising PSA following treatment for malignant neoplasm of prostate: Secondary | ICD-10-CM | POA: Diagnosis not present

## 2017-10-01 DIAGNOSIS — R350 Frequency of micturition: Secondary | ICD-10-CM | POA: Diagnosis not present

## 2017-10-01 DIAGNOSIS — N320 Bladder-neck obstruction: Secondary | ICD-10-CM | POA: Diagnosis not present

## 2017-10-01 DIAGNOSIS — C61 Malignant neoplasm of prostate: Secondary | ICD-10-CM | POA: Diagnosis not present

## 2017-10-09 ENCOUNTER — Ambulatory Visit (INDEPENDENT_AMBULATORY_CARE_PROVIDER_SITE_OTHER): Payer: PPO

## 2017-10-09 VITALS — BP 118/70 | HR 99 | Temp 97.9°F | Ht 68.0 in | Wt 158.2 lb

## 2017-10-09 DIAGNOSIS — E559 Vitamin D deficiency, unspecified: Secondary | ICD-10-CM

## 2017-10-09 DIAGNOSIS — E785 Hyperlipidemia, unspecified: Secondary | ICD-10-CM

## 2017-10-09 DIAGNOSIS — Z0000 Encounter for general adult medical examination without abnormal findings: Secondary | ICD-10-CM

## 2017-10-09 DIAGNOSIS — I10 Essential (primary) hypertension: Secondary | ICD-10-CM

## 2017-10-09 LAB — LIPID PANEL
Cholesterol: 155 mg/dL (ref 0–200)
HDL: 75.9 mg/dL (ref >39.00)
LDL Cholesterol: 54 mg/dL (ref 0–99)
NonHDL: 79.13
Total CHOL/HDL Ratio: 2
Triglycerides: 125 mg/dL (ref 0.0–149.0)
VLDL: 25 mg/dL (ref 0.0–40.0)

## 2017-10-09 LAB — CBC WITH DIFFERENTIAL/PLATELET
Basophils Absolute: 0 10*3/uL (ref 0.0–0.1)
Basophils Relative: 0.3 % (ref 0.0–3.0)
Eosinophils Absolute: 0.1 10*3/uL (ref 0.0–0.7)
Eosinophils Relative: 1.4 % (ref 0.0–5.0)
HCT: 43.6 % (ref 39.0–52.0)
Hemoglobin: 14.7 g/dL (ref 13.0–17.0)
Lymphocytes Relative: 33.6 % (ref 12.0–46.0)
Lymphs Abs: 2.9 10*3/uL (ref 0.7–4.0)
MCHC: 33.7 g/dL (ref 30.0–36.0)
MCV: 96.6 fl (ref 78.0–100.0)
Monocytes Absolute: 0.8 10*3/uL (ref 0.1–1.0)
Monocytes Relative: 9.1 % (ref 3.0–12.0)
Neutro Abs: 4.8 10*3/uL (ref 1.4–7.7)
Neutrophils Relative %: 55.6 % (ref 43.0–77.0)
Platelets: 269 10*3/uL (ref 150.0–400.0)
RBC: 4.51 Mil/uL (ref 4.22–5.81)
RDW: 12.7 % (ref 11.5–15.5)
WBC: 8.6 10*3/uL (ref 4.0–10.5)

## 2017-10-09 LAB — COMPREHENSIVE METABOLIC PANEL
ALT: 15 U/L (ref 0–53)
AST: 15 U/L (ref 0–37)
Albumin: 3.9 g/dL (ref 3.5–5.2)
Alkaline Phosphatase: 77 U/L (ref 39–117)
BUN: 9 mg/dL (ref 6–23)
CO2: 30 mEq/L (ref 19–32)
Calcium: 9.9 mg/dL (ref 8.4–10.5)
Chloride: 101 mEq/L (ref 96–112)
Creatinine, Ser: 0.8 mg/dL (ref 0.40–1.50)
GFR: 99.54 mL/min (ref >60.00)
Glucose, Bld: 114 mg/dL — ABNORMAL HIGH (ref 70–99)
Potassium: 4.3 mEq/L (ref 3.5–5.1)
Sodium: 139 mEq/L (ref 135–145)
Total Bilirubin: 0.6 mg/dL (ref 0.2–1.2)
Total Protein: 7.1 g/dL (ref 6.0–8.3)

## 2017-10-09 LAB — VITAMIN D 25 HYDROXY (VIT D DEFICIENCY, FRACTURES): VITD: 41.72 ng/mL (ref 30.00–100.00)

## 2017-10-09 LAB — LDL CHOLESTEROL, DIRECT: Direct LDL: 58 mg/dL

## 2017-10-09 NOTE — Progress Notes (Signed)
PCP notes:   Health maintenance:  Tetanus vaccine - postponed/insurance  Abnormal screenings:   Hearing - failed  Hearing Screening   125Hz  250Hz  500Hz  1000Hz  2000Hz  3000Hz  4000Hz  6000Hz  8000Hz   Right ear:   0 0 0  0    Left ear:   40 0 0  0    Comments: Tinnitus in both ears  Patient concerns:   None  Nurse concerns:  None  Next PCP appt:   10/19/17 @ 0930  I reviewed health advisor's note, was available for consultation on the day of service listed in this note, and agree with documentation and plan. Elsie Stain, MD.

## 2017-10-09 NOTE — Progress Notes (Signed)
Subjective:   Parker Martinez is a 76 y.o. male who presents for Medicare Annual/Subsequent preventive examination.  Review of Systems:  N/A Cardiac Risk Factors include: advanced age (>6men, >36 women);male gender;hypertension;dyslipidemia;smoking/ tobacco exposure     Objective:    Vitals: BP 118/70 (BP Location: Right Arm, Patient Position: Sitting, Cuff Size: Normal)   Pulse 99   Temp 97.9 F (36.6 C) (Oral)   Ht 5\' 8"  (1.727 m) Comment: shoes  Wt 158 lb 4 oz (71.8 kg)   SpO2 97%   BMI 24.06 kg/m   Body mass index is 24.06 kg/m.  Tobacco Social History   Tobacco Use  Smoking Status Current Every Day Smoker  . Packs/day: 1.00  . Years: 60.00  . Pack years: 60.00  . Types: Cigarettes  Smokeless Tobacco Never Used  Tobacco Comment   would like to quit not ready to. use vapor cig     Ready to quit: No Counseling given: No Comment: would like to quit not ready to. use vapor cig   Past Medical History:  Diagnosis Date  . Abnormal drug screen 10/2013, 03/2015   low Cr, +EtOH, neg hydrocodone (10/2013, again 03/2015)  . Allergic rhinitis   . Arthritis    lumbar and cervical spine  . Carotid stenosis 09/11/2014   40-59% bilat ICA stenosis, rpt 1 yr (09/2014)   . COPD (chronic obstructive pulmonary disease) (HCC)    severe, participated in [place] study  . DDD (degenerative disc disease)    lumbar (HNP R L3/4) s/p ESI, cervical  . Diverticulitis 2015   by CT  . Diverticulosis 2008   severe by colonoscopy and CT  . Duodenitis   . Esophageal stricture 2012   s/p dilation  . GERD (gastroesophageal reflux disease)   . Hemangioma    congenital left arm  . History of kidney stones remote  . HTN (hypertension)    borderline readings at home  . Idiopathic anaphylaxis    h/o recurrent anaphylaxis, unknown trigger, s/p eval at [place] [phone]), last episode 03/2011  . PAD (peripheral artery disease) ([place])   . Personal history of colonic adenomas  09/02/2013  . Prostate cancer [facility]) 2011   prostate seed implant (Dr. [name])  . Smoker   . Status post dilatation of esophageal stricture   . Subclavian steal syndrome 10/12/2016   Left subclavian steal, with 18 mmHg brachial artery pressure gradient (10/2016). If symptomatic, rec PV consult   Past Surgical History:  Procedure Laterality Date  . COLONOSCOPY  08/2013   4 tubular adenomas, severe divertic, rpt 3 yrs [name])  . COLONOSCOPY  10/2016   severe diverticulosis, no f/u recommended [name])  . ESOPHAGOGASTRODUODENOSCOPY  10/2016   2 rings dilated [name])  . INSERTION PROSTATE RADIATION SEED  01/2015   [name]  . SHOULDER SURGERY  1961   due to chronic dislocation  . SPIROMETRY  10/2010   severe obstruction, FEV1 41%, ratio 0.46  . TONSILLECTOMY  1944 ?  . UPPER GASTROINTESTINAL ENDOSCOPY  06/08/2009   dilated esophageal stricture, duodenitis  . virtual colonoscopy  2007   [name], rpt rec 5 yrs   Family History  Problem Relation Age of Onset  . Lung cancer Father   . Hyperlipidemia Father   . Arthritis Mother   . Breast cancer Daughter   . Coronary artery disease Neg Hx   . Stroke Neg Hx   . Diabetes Neg Hx    Social History   Substance and Sexual  Activity  Sexual Activity Not on file    Outpatient Encounter Medications as of 10/09/2017  Medication Sig  . alfuzosin (UROXATRAL) 10 MG 24 hr tablet Take 10 mg by mouth daily with breakfast.  . aspirin 81 MG tablet Take 81 mg by mouth every morning.   . budesonide-formoterol (SYMBICORT) 160-4.5 MCG/ACT inhaler Inhale 2 puffs into the lungs 2 (two) times daily.  . cyclobenzaprine (FLEXERIL) 10 MG tablet Take 1 tablet (10 mg total) by mouth 2 (two) times daily as needed for muscle spasms.  Marland Kitchen EPINEPHrine 0.3 mg/0.3 mL IJ SOAJ injection Inject 0.3 mLs (0.3 mg total) into the muscle once.  . fexofenadine (ALLEGRA) 180 MG tablet Take 180 mg by mouth daily.  Marland Kitchen HYDROcodone-acetaminophen (NORCO/VICODIN) 5-325 MG tablet TAKE  ONE TABLET BY MOUTH EVERY 6 HOURS AS NEEDED FOR MODERATE PAIN  . montelukast (SINGULAIR) 10 MG tablet TAKE ONE TABLET BY MOUTH EVERY NIGHT AT BEDTIME  . Multiple Vitamin (MULTIVITAMIN) tablet Take 1 tablet by mouth daily.  . pantoprazole (PROTONIX) 40 MG tablet TAKE 1 TABLET BY MOUTH DAILY BEFORE BREAKFAST.  Marland Kitchen pravastatin (PRAVACHOL) 40 MG tablet TAKE 1 TABLET BY MOUTH DAILY  . ranitidine (ZANTAC) 150 MG tablet Take 150 mg by mouth daily.  Marland Kitchen SPIRIVA HANDIHALER 18 MCG inhalation capsule PLACE 1 CAPSULE INTO INHALER AND INHALE ONCE DAILY AS DIRECTED  . VENTOLIN HFA 108 (90 Base) MCG/ACT inhaler USE 2 PUFFS EVERY 6 HOURS AS NEEDED FOR WHEEZING  . vitamin B-12 (CYANOCOBALAMIN) 500 MCG tablet Take 500 mcg by mouth every morning.   . fluticasone (FLONASE) 50 MCG/ACT nasal spray USE TWO SPRAYS IN EACH NOSTRIL DAILY   Facility-Administered Encounter Medications as of 10/09/2017  Medication  . 0.9 %  sodium chloride infusion    Activities of Daily Living In your present state of health, do you have any difficulty performing the following activities: 10/09/2017  Hearing? Y  Vision? Y  Difficulty concentrating or making decisions? N  Walking or climbing stairs? Y  Dressing or bathing? N  Doing errands, shopping? N  Preparing Food and eating ? N  Using the Toilet? N  In the past six months, have you accidently leaked urine? Y  Do you have problems with loss of bowel control? N  Managing your Medications? N  Managing your Finances? N  Housekeeping or managing your Housekeeping? N  Some recent data might be hidden    Patient Care Team: Ria Bush, MD as PCP - General (Family Medicine) [name], MD as Consulting Physician (Ophthalmology) Angelia Mould, MD as Consulting Physician (Vascular Surgery) Gatha Mayer, MD as Consulting Physician (Gastroenterology) Myrlene Broker, MD as Attending Physician (Urology)   Assessment:    Hearing Screening   125Hz  250Hz   500Hz  1000Hz  2000Hz  3000Hz  4000Hz  6000Hz  8000Hz   Right ear:   0 0 0  0    Left ear:   40 0 0  0    Comments: Tinnitus in both ears  Vision Screening Comments: Last vision exam in Nov 2017; Future appt scheduled 10/24/2017 with Dr. Edison Pace   Exercise Activities and Dietary recommendations Current Exercise Habits: Home exercise routine, Type of exercise: stretching;Other - see comments(resistance bands), Time (Minutes): 35, Frequency (Times/Week): 7, Weekly Exercise (Minutes/Week): 245, Intensity: Mild, Exercise limited by: respiratory conditions(s)  Goals    Starting 10/09/2017, I will continue to do strengthening exercises in an effort to build endurance for walking longer distances.      Fall Risk Fall Risk  10/09/2017  09/20/2016 09/16/2015 09/11/2014  Falls in the past year? No No No Yes  Number falls in past yr: - - - 1  Comment - - - Dog pulled him off the stool  Injury with Fall? - - - Yes  Comment - - - Tore muscles in right shoulder   Depression Screen PHQ 2/9 Scores 10/09/2017 05/21/2017 09/20/2016 09/16/2015  PHQ - 2 Score 0 0 0 0  PHQ- 9 Score 0 - - -    Cognitive Function MMSE - Mini Mental State Exam 10/09/2017  Orientation to time 5  Orientation to Place 5  Registration 3  Attention/ Calculation 0  Recall 3  Language- name 2 objects 0  Language- repeat 1  Language- follow 3 step command 3  Language- read & follow direction 0  Write a sentence 0  Copy design 0  Total score 20     PLEASE NOTE: A Mini-Cog screen was completed. Maximum score is 20. A value of 0 denotes this part of [place] MMSE was not completed or the patient failed this part of the Mini-Cog screening.   Mini-Cog Screening Orientation to Time - Max 5 pts Orientation to Place - Max 5 pts Registration - Max 3 pts Recall - Max 3 pts Language Repeat - Max 1 pts Language Follow 3 Step Command - Max 3 pts     Immunization History  Administered Date(s) Administered  . Influenza,inj,Quad PF,6+ Mos  09/11/2014, 09/24/2015, 09/20/2016, 09/14/2017  . Pneumococcal Conjugate-13 09/11/2014  . Pneumococcal Polysaccharide-23 12/04/2009   Screening Tests Health Maintenance  Topic Date Due  . DTaP/Tdap/Td (1 - Tdap) 10/10/2019 (Originally 05/31/1960)  . TETANUS/TDAP  10/10/2019 (Originally 05/31/1960)  . COLONOSCOPY  10/31/2019  . INFLUENZA VACCINE  Completed  . PNA vac Low Risk Adult  Completed      Plan:     I have personally reviewed, addressed, and noted the following in the patient's chart:  A. Medical and social history B. Use of alcohol, tobacco or illicit drugs  C. Current medications and supplements D. Functional ability and status E.  Nutritional status F.  Physical activity G. Advance directives H. List of other physicians I.  Hospitalizations, surgeries, and ER visits in previous 12 months J.  [place] to include hearing, vision, cognitive, depression L. Referrals and appointments - none  In addition, I have reviewed and discussed with patient certain preventive protocols, quality metrics, and best practice recommendations. A written personalized care plan for preventive services as well as general preventive health recommendations were provided to patient.  See attached scanned questionnaire for additional information.   Signed,   Lindell Noe, MHA, BS, LPN Health Coach

## 2017-10-09 NOTE — Progress Notes (Signed)
Pre visit review using our clinic review tool, if applicable. No additional management support is needed unless otherwise documented below in the visit note. 

## 2017-10-09 NOTE — Patient Instructions (Addendum)
Parker Martinez , Thank you for taking time to come for your Medicare Wellness Visit. I appreciate your ongoing commitment to your health goals. Please review the following plan we discussed and let me know if I can assist you in the future.   These are the goals we discussed: Goals    Starting 10/09/2017, I will continue to do strengthening exercises in an effort to build endurance for walking longer distances.       This is a list of the screening recommended for you and due dates:  Health Maintenance  Topic Date Due  . DTaP/Tdap/Td vaccine (1 - Tdap) 10/10/2019*  . Tetanus Vaccine  10/10/2019*  . Colon Cancer Screening  10/31/2019  . Flu Shot  Completed  . Pneumonia vaccines  Completed  *Topic was postponed. The date shown is not the original due date.   Preventive Care for Adults  A healthy lifestyle and preventive care can promote health and wellness. Preventive health guidelines for adults include the following key practices.  . A routine yearly physical is a good way to check with your health care provider about your health and preventive screening. It is a chance to share any concerns and updates on your health and to receive a thorough exam.  . Visit your dentist for a routine exam and preventive care every 6 months. Brush your teeth twice a day and floss once a day. Good oral hygiene prevents tooth decay and gum disease.  . The frequency of eye exams is based on your age, health, family medical history, use  of contact lenses, and other factors. Follow your health care provider's recommendations for frequency of eye exams.  . Eat a healthy diet. Foods like vegetables, fruits, whole grains, low-fat dairy products, and lean protein foods contain the nutrients you need without too many calories. Decrease your intake of foods high in solid fats, added sugars, and salt. Eat the right amount of calories for you. Get information about a proper diet from your health care provider, if  necessary.  . Regular physical exercise is one of the most important things you can do for your health. Most adults should get at least 150 minutes of moderate-intensity exercise (any activity that increases your heart rate and causes you to sweat) each week. In addition, most adults need muscle-strengthening exercises on 2 or more days a week.  Silver Sneakers may be a benefit available to you. To determine eligibility, you may visit the website: www.silversneakers.com or contact program at [phone] Mon-Fri between 8AM-8PM.   . Maintain a healthy weight. The body mass index (BMI) is a screening tool to identify possible weight problems. It provides an estimate of body fat based on height and weight. Your health care provider can find your BMI and can help you achieve or maintain a healthy weight.   For adults 20 years and older: ? A BMI below 18.5 is considered underweight. ? A BMI of 18.5 to 24.9 is normal. ? A BMI of 25 to 29.9 is considered overweight. ? A BMI of 30 and above is considered obese.   . Maintain normal blood lipids and cholesterol levels by exercising and minimizing your intake of saturated fat. Eat a balanced diet with plenty of fruit and vegetables. Blood tests for lipids and cholesterol should begin at age 58 and be repeated every 5 years. If your lipid or cholesterol levels are high, you are over 50, or you are at high risk for heart disease, you may need your  cholesterol levels checked more frequently. Ongoing high lipid and cholesterol levels should be treated with medicines if diet and exercise are not working.  . If you smoke, find out from your health care provider how to quit. If you do not use tobacco, please do not start.  . If you choose to drink alcohol, please do not consume more than 2 drinks per day. One drink is considered to be 12 ounces (355 mL) of beer, 5 ounces (148 mL) of wine, or 1.5 ounces (44 mL) of liquor.  . If you are 59-23 years old, ask your  health care provider if you should take aspirin to prevent strokes.  . Use sunscreen. Apply sunscreen liberally and repeatedly throughout the day. You should seek shade when your shadow is shorter than you. Protect yourself by wearing long sleeves, pants, a wide-brimmed hat, and sunglasses year round, whenever you are outdoors.  . Once a month, do a whole body skin exam, using a mirror to look at the skin on your back. Tell your health care provider of new moles, moles that have irregular borders, moles that are larger than a pencil eraser, or moles that have changed in shape or color.

## 2017-10-19 ENCOUNTER — Encounter: Payer: Self-pay | Admitting: Family Medicine

## 2017-10-19 ENCOUNTER — Ambulatory Visit (INDEPENDENT_AMBULATORY_CARE_PROVIDER_SITE_OTHER): Payer: PPO | Admitting: Family Medicine

## 2017-10-19 VITALS — BP 122/80 | HR 74 | Temp 97.8°F | Ht 68.0 in | Wt 158.0 lb

## 2017-10-19 DIAGNOSIS — E559 Vitamin D deficiency, unspecified: Secondary | ICD-10-CM

## 2017-10-19 DIAGNOSIS — C61 Malignant neoplasm of prostate: Secondary | ICD-10-CM

## 2017-10-19 DIAGNOSIS — J449 Chronic obstructive pulmonary disease, unspecified: Secondary | ICD-10-CM | POA: Diagnosis not present

## 2017-10-19 DIAGNOSIS — G8929 Other chronic pain: Secondary | ICD-10-CM | POA: Diagnosis not present

## 2017-10-19 DIAGNOSIS — M25512 Pain in left shoulder: Secondary | ICD-10-CM | POA: Diagnosis not present

## 2017-10-19 DIAGNOSIS — E785 Hyperlipidemia, unspecified: Secondary | ICD-10-CM

## 2017-10-19 DIAGNOSIS — I739 Peripheral vascular disease, unspecified: Secondary | ICD-10-CM

## 2017-10-19 DIAGNOSIS — I70213 Atherosclerosis of native arteries of extremities with intermittent claudication, bilateral legs: Secondary | ICD-10-CM | POA: Diagnosis not present

## 2017-10-19 DIAGNOSIS — I10 Essential (primary) hypertension: Secondary | ICD-10-CM

## 2017-10-19 DIAGNOSIS — F17200 Nicotine dependence, unspecified, uncomplicated: Secondary | ICD-10-CM

## 2017-10-19 DIAGNOSIS — M5136 Other intervertebral disc degeneration, lumbar region: Secondary | ICD-10-CM

## 2017-10-19 DIAGNOSIS — G458 Other transient cerebral ischemic attacks and related syndromes: Secondary | ICD-10-CM | POA: Diagnosis not present

## 2017-10-19 DIAGNOSIS — M51369 Other intervertebral disc degeneration, lumbar region without mention of lumbar back pain or lower extremity pain: Secondary | ICD-10-CM

## 2017-10-19 DIAGNOSIS — Z0000 Encounter for general adult medical examination without abnormal findings: Secondary | ICD-10-CM | POA: Diagnosis not present

## 2017-10-19 DIAGNOSIS — I6523 Occlusion and stenosis of bilateral carotid arteries: Secondary | ICD-10-CM

## 2017-10-19 MED ORDER — HYDROCODONE-ACETAMINOPHEN 5-325 MG PO TABS: 1.0000 | ORAL_TABLET | Freq: Four times a day (QID) | ORAL | 0 refills | Status: DC | PRN

## 2017-10-19 NOTE — Assessment & Plan Note (Signed)
[place] CSRS reviewed. Doesn't use regularly. Gets #50 every 2 months.  rec he return in 6 months for narcotic management visit.

## 2017-10-19 NOTE — Assessment & Plan Note (Signed)
Appreciate uro care

## 2017-10-19 NOTE — Assessment & Plan Note (Signed)
Chronic, stable. Continue pravastatin.  The 10-year ASCVD risk score Parker Martinez., et al., 2013) is: 22.5%   Values used to calculate the score:     Age: 76 years     Sex: Male     Is Non-Hispanic African American: No     Diabetic: No     Tobacco smoker: Yes     Systolic Blood Pressure: 350 mmHg     Is BP treated: No     HDL Cholesterol: 75.9 mg/dL     Total Cholesterol: 155 mg/dL

## 2017-10-19 NOTE — Assessment & Plan Note (Signed)
Levels well controlled

## 2017-10-19 NOTE — Assessment & Plan Note (Signed)
Now sees [facility].

## 2017-10-19 NOTE — Assessment & Plan Note (Signed)
Continue to encourage cessation. contemplative

## 2017-10-19 NOTE — Progress Notes (Signed)
BP 122/80 (BP Location: Left Arm, Patient Position: Sitting, Cuff Size: Normal)   Pulse 74   Temp 97.8 F (36.6 C) (Oral)   Ht 5\' 8"  (1.727 m)   Wt 158 lb (71.7 kg)   SpO2 95%   BMI 24.02 kg/m    CC: CPE Subjective:    Patient ID: Parker Martinez, male    DOB: [birth year], 76 y.o.   MRN: [id number]  HPI: Parker Martinez is a 76 y.o. male presenting on 10/19/2017 for Annual Exam (Pt 2)   Saw Katha Cabal last week for medicare wellness visit. Note reviewed.  Failed hearing screen R>L. Planning to return to see audiology/ENT.   Known severe COPD presumed emphysema not oxygen dependent - on spiriva and symbicort. Continued 1/2-1ppd smoker Spirometry 05/2016: Severe airway obstruction without significant response to bronchodilator. Pre: FVC 102%, FEV1 45%, ratio 0.33 Post: FVC 102%, FEV1 48%, ratio 0.36  Increasing hydrocodone use noted - takes for chronic left shoulder arthritis as well as other arthralgias (knees). Worse pain in the mornings, takes intermittently several days a week. NSAIDs area helpful.   Preventative: COLONOSCOPY 10/2016 - severe diverticulosis, no f/u recommended [name]) Prostate cancer - prostate seeds 2016 (Dr. [name] at [facility]). Normal recent CT scan.  Lung cancer screening - remotely completed [facility]  Flu shot yearly Tetanus - unsure Pneumonia 2011, prevnar 2015 shingrix - discussed Advanced directives: has living will at home, scanned into chart (09/2014). Wife is Ladean Raya. "no extraordinary efforts" does not want feeding tube. Seat belt use discussed  Sunscreen use discussed. No changing moles on skin.  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 Interim medical history since our last visit reviewed. Allergies and medications reviewed and updated. Outpatient Medications Prior to Visit  Medication Sig Dispense Refill  . alfuzosin (UROXATRAL) 10 MG 24 hr tablet Take 10 mg by mouth daily with breakfast.    . aspirin 81 MG tablet Take 81 mg by mouth every morning.     . budesonide-formoterol (SYMBICORT) 160-4.5 MCG/ACT inhaler Inhale 2 puffs into the lungs 2 (two) times daily. 1 Inhaler 12  . cyclobenzaprine (FLEXERIL) 10 MG tablet Take 1 tablet (10 mg total) by mouth 2 (two) times daily as needed for muscle spasms. 30 tablet 0  . EPINEPHrine 0.3 mg/0.3 mL IJ SOAJ injection Inject 0.3 mLs (0.3 mg total) into the muscle once. 1 Device 1  . fexofenadine (ALLEGRA) 180 MG tablet Take 180 mg by mouth daily.    . montelukast (SINGULAIR) 10 MG tablet TAKE ONE TABLET BY MOUTH EVERY NIGHT AT BEDTIME 30 tablet 1  . Multiple Vitamin (MULTIVITAMIN) tablet Take 1 tablet by mouth daily.    . pantoprazole (PROTONIX) 40 MG tablet TAKE 1 TABLET BY MOUTH DAILY BEFORE BREAKFAST. 30 tablet 11  . pravastatin (PRAVACHOL) 40 MG tablet TAKE 1 TABLET BY MOUTH DAILY 30 tablet 0  . ranitidine (ZANTAC) 150 MG tablet Take 150 mg by mouth daily.    Marland Kitchen SPIRIVA HANDIHALER 18 MCG inhalation capsule PLACE 1 CAPSULE INTO INHALER AND INHALE ONCE DAILY AS DIRECTED 30 capsule 6  . VENTOLIN HFA 108 (90 Base) MCG/ACT inhaler USE 2 PUFFS EVERY 6  HOURS AS NEEDED FOR WHEEZING 18 g 3  . vitamin B-12 (CYANOCOBALAMIN) 500 MCG tablet Take 500 mcg by mouth every morning.     Marland Kitchen HYDROcodone-acetaminophen (NORCO/VICODIN) 5-325 MG tablet TAKE ONE TABLET BY MOUTH EVERY 6 HOURS AS NEEDED FOR MODERATE PAIN 50 tablet 0  . fluticasone (FLONASE) 50 MCG/ACT nasal spray USE TWO SPRAYS IN EACH NOSTRIL DAILY 16 g 6   Facility-Administered Medications Prior to Visit  Medication Dose Route Frequency Provider Last Rate Last Dose  . 0.9 %  sodium chloride  infusion  500 mL Intravenous Continuous Gatha Mayer, MD         Per HPI unless specifically indicated in ROS section below Review of Systems  Constitutional: Negative for activity change, appetite change, chills, fatigue, fever and unexpected weight change.  HENT: Negative for hearing loss.   Eyes: Negative for visual disturbance.  Respiratory: Positive for cough, shortness of breath and wheezing. Negative for chest tightness.   Cardiovascular: Negative for chest pain, palpitations and leg swelling.  Gastrointestinal: Negative for abdominal distention, abdominal pain, blood in stool, constipation, diarrhea, nausea and vomiting.  Genitourinary: Negative for difficulty urinating and hematuria.  Musculoskeletal: Negative for arthralgias, myalgias and neck pain.  Skin: Negative for rash.  Neurological: Negative for dizziness, seizures, syncope and headaches.  Hematological: Negative for adenopathy. Does not bruise/bleed easily.  Psychiatric/Behavioral: Negative for dysphoric mood. The patient is not nervous/anxious.        Objective:    BP 122/80 (BP Location: Left Arm, Patient Position: Sitting, Cuff Size: Normal)   Pulse 74   Temp 97.8 F (36.6 C) (Oral)   Ht 5\' 8"  (1.727 m)   Wt 158 lb (71.7 kg)   SpO2 95%   BMI 24.02 kg/m   Wt Readings from Last 3 Encounters:  10/19/17 158 lb (71.7 kg)  10/09/17 158 lb 4 oz (71.8 kg)  06/14/17 156 lb 6.4 oz (70.9 kg)    Physical Exam  Constitutional: He is oriented to person, place, and time. He appears well-developed and well-nourished. No distress.  HENT:  Head: Normocephalic and atraumatic.  Right Ear: Hearing, tympanic membrane, external ear and ear canal normal.  Left Ear: Hearing, tympanic membrane, external ear and ear canal normal.  Nose: Nose normal.  Mouth/Throat: Uvula is midline, oropharynx is clear and moist and mucous membranes are normal. No oropharyngeal exudate, posterior oropharyngeal edema or posterior oropharyngeal  erythema.  Eyes: Conjunctivae and EOM are normal. Pupils are equal, round, and reactive to light. No scleral icterus.  Neck: Normal range of motion. Neck supple. No thyromegaly present.  Cardiovascular: Normal rate, regular rhythm, normal heart sounds and intact distal pulses.  No murmur heard. Pulses:      Radial pulses are 2+ on the right side, and 2+ on the left side.  Pulmonary/Chest: Effort normal and breath sounds normal. No respiratory distress. He has no wheezes. He has no rales.  Abdominal: Soft. Bowel sounds are normal. He exhibits no distension and no mass. There is no tenderness. There is no rebound and no guarding.  Musculoskeletal: Normal range of motion. He exhibits no edema.  Lymphadenopathy:    He has no cervical adenopathy.  Neurological: He is alert and oriented to person, place, and time.  CN grossly intact, station and gait intact  Skin: Skin is warm and dry. No rash noted.  Psychiatric: He has a normal mood and affect. His behavior is normal. Judgment and thought content normal.  Nursing note and vitals reviewed.  Results for  orders placed or performed in visit on 10/09/17  Lipid Panel  Result Value Ref Range   Cholesterol 155 0 - 200 mg/dL   Triglycerides 125.0 0.0 - 149.0 mg/dL   HDL 75.90 >39.00 mg/dL   VLDL 25.0 0.0 - 40.0 mg/dL   LDL Cholesterol 54 0 - 99 mg/dL   Total CHOL/HDL Ratio 2    NonHDL 79.13   LDL Cholesterol, Direct  Result Value Ref Range   Direct LDL 58.0 mg/dL  Comprehensive metabolic panel  Result Value Ref Range   Sodium 139 135 - 145 mEq/L   Potassium 4.3 3.5 - 5.1 mEq/L   Chloride 101 96 - 112 mEq/L   CO2 30 19 - 32 mEq/L   Glucose, Bld 114 (H) 70 - 99 mg/dL   BUN 9 6 - 23 mg/dL   Creatinine, Ser 0.80 0.40 - 1.50 mg/dL   Total Bilirubin 0.6 0.2 - 1.2 mg/dL   Alkaline Phosphatase 77 39 - 117 U/L   AST 15 0 - 37 U/L   ALT 15 0 - 53 U/L   Total Protein 7.1 6.0 - 8.3 g/dL   Albumin 3.9 3.5 - 5.2 g/dL   Calcium 9.9 8.4 - 10.5 mg/dL    GFR 99.54 >60.00 mL/min  CBC with Differential/Platelet  Result Value Ref Range   WBC 8.6 4.0 - 10.5 K/uL   RBC 4.51 4.22 - 5.81 Mil/uL   Hemoglobin 14.7 13.0 - 17.0 g/dL   HCT 43.6 39.0 - 52.0 %   MCV 96.6 78.0 - 100.0 fl   MCHC 33.7 30.0 - 36.0 g/dL   RDW 12.7 11.5 - 15.5 %   Platelets 269.0 150.0 - 400.0 K/uL   Neutrophils Relative % 55.6 43.0 - 77.0 %   Lymphocytes Relative 33.6 12.0 - 46.0 %   Monocytes Relative 9.1 3.0 - 12.0 %   Eosinophils Relative 1.4 0.0 - 5.0 %   Basophils Relative 0.3 0.0 - 3.0 %   Neutro Abs 4.8 1.4 - 7.7 K/uL   Lymphs Abs 2.9 0.7 - 4.0 K/uL   Monocytes Absolute 0.8 0.1 - 1.0 K/uL   Eosinophils Absolute 0.1 0.0 - 0.7 K/uL   Basophils Absolute 0.0 0.0 - 0.1 K/uL  Vitamin D, 25-hydroxy  Result Value Ref Range   VITD 41.72 30.00 - 100.00 ng/mL      Assessment & Plan:   Problem List Items Addressed This Visit    Atherosclerosis of native arteries of extremity with intermittent claudication (HCC)    Now sees [facility].       Carotid stenosis    Update carotid US.       Relevant Orders   VAS US CAROTID   Chronic pain in left shoulder    Increased hydrocodone use noted. Discussed.       Relevant Medications   HYDROcodone-acetaminophen (NORCO/VICODIN) 5-325 MG tablet   Other Relevant Orders   Pain Mgmt, Profile 8 w/Conf, U   COPD, severe ([place])    Compliant with meds. Strongly encouraged cessation.       Current smoker    Continue to encourage cessation. contemplative      DDD (degenerative disc disease), lumbar    Hydrocodone refilled.       Relevant Medications   HYDROcodone-acetaminophen (NORCO/VICODIN) 5-325 MG tablet   Dyslipidemia    Chronic, stable. Continue pravastatin.  The 10-year ASCVD risk score Mikey Bussing DC Jr., et al., 2013) is: 22.5%   Values used to calculate the score:     Age: 14  years     Sex: Male     Is Non-Hispanic African American: No     Diabetic: No     Tobacco smoker: Yes     Systolic Blood Pressure: 920  mmHg     Is BP treated: No     HDL Cholesterol: 75.9 mg/dL     Total Cholesterol: 155 mg/dL       Encounter for chronic pain management    [place] CSRS reviewed. Doesn't use regularly. Gets #50 every 2 months.  rec he return in 6 months for narcotic management visit.       Relevant Orders   Pain Mgmt, Profile 8 w/Conf, U   Healthcare maintenance - Primary    Preventative protocols reviewed and updated unless pt declined. Discussed healthy diet and lifestyle.       HTN (hypertension)    Chronic, stable. Continue current regimen.       PAD (peripheral artery disease) (HCC)    Chronic, continue aspirin and statin.       Prostate cancer ([place])    Appreciate uro care      Subclavian steal syndrome    Update carotid US. Now sees [facility].       Vitamin D deficiency    Levels well controlled          Follow up plan: Return in about 1 year (around 10/19/2018) for medicare wellness visit, annual exam, prior fasting for blood work.  Ria Bush, MD

## 2017-10-19 NOTE — Assessment & Plan Note (Signed)
Update carotid US.  

## 2017-10-19 NOTE — Assessment & Plan Note (Signed)
Chronic, continue aspirin and statin.

## 2017-10-19 NOTE — Assessment & Plan Note (Signed)
Hydrocodone refilled.

## 2017-10-19 NOTE — Assessment & Plan Note (Addendum)
Increased hydrocodone use noted. Discussed.

## 2017-10-19 NOTE — Assessment & Plan Note (Signed)
Chronic, stable. Continue current regimen. 

## 2017-10-19 NOTE — Assessment & Plan Note (Signed)
Preventative protocols reviewed and updated unless pt declined. Discussed healthy diet and lifestyle.  

## 2017-10-19 NOTE — Assessment & Plan Note (Signed)
Update carotid US. Now sees [facility].

## 2017-10-19 NOTE — Assessment & Plan Note (Addendum)
Compliant with meds. Strongly encouraged cessation.

## 2017-10-19 NOTE — Patient Instructions (Addendum)
If interested, check with pharmacy about new 2 shot shingles series (shingrix).  Work on quitting smoking - best thing you can do for your health.  Schedule appointment with ENT for hearing. Return as needed or in 1 year for medicare wellness visit with Katha Cabal and physical with me.  Return in 6 months for follow up visit on pain medicine. Urine drug screen today. Hydrocodone refilled today.   Health Maintenance, Male A healthy lifestyle and preventive care is important for your health and wellness. Ask your health care provider about what schedule of regular examinations is right for you. What should I know about weight and diet? Eat a Healthy Diet  Eat plenty of vegetables, fruits, whole grains, low-fat dairy products, and lean protein.  Do not eat a lot of foods high in solid fats, added sugars, or salt.  Maintain a Healthy Weight Regular exercise can help you achieve or maintain a healthy weight. You should:  Do at least 150 minutes of exercise each week. The exercise should increase your heart rate and make you sweat (moderate-intensity exercise).  Do strength-training exercises at least twice a week.  Watch Your Levels of Cholesterol and Blood Lipids  Have your blood tested for lipids and cholesterol every 5 years starting at 77 years of age. If you are at high risk for heart disease, you should start having your blood tested when you are 77 years old. You may need to have your cholesterol levels checked more often if: ? Your lipid or cholesterol levels are high. ? You are older than 77 years of age. ? You are at high risk for heart disease.  What should I know about cancer screening? Many types of cancers can be detected early and may often be prevented. Lung Cancer  You should be screened every year for lung cancer if: ? You are a current smoker who has smoked for at least 30 years. ? You are a former smoker who has quit within the past 15 years.  Talk to your health care  provider about your screening options, when you should start screening, and how often you should be screened.  Colorectal Cancer  Routine colorectal cancer screening usually begins at 77 years of age and should be repeated every 5-10 years until you are 77 years old. You may need to be screened more often if early forms of precancerous polyps or small growths are found. Your health care provider may recommend screening at an earlier age if you have risk factors for colon cancer.  Your health care provider may recommend using home test kits to check for hidden blood in the stool.  A small camera at the end of a tube can be used to examine your colon (sigmoidoscopy or colonoscopy). This checks for the earliest forms of colorectal cancer.  Prostate and Testicular Cancer  Depending on your age and overall health, your health care provider may do certain tests to screen for prostate and testicular cancer.  Talk to your health care provider about any symptoms or concerns you have about testicular or prostate cancer.  Skin Cancer  Check your skin from head to toe regularly.  Tell your health care provider about any new moles or changes in moles, especially if: ? There is a change in a mole's size, shape, or color. ? You have a mole that is larger than a pencil eraser.  Always use sunscreen. Apply sunscreen liberally and repeat throughout the day.  Protect yourself by wearing long sleeves, pants,  a wide-brimmed hat, and sunglasses when outside.  What should I know about heart disease, diabetes, and high blood pressure?  If you are 20-60 years of age, have your blood pressure checked every 3-5 years. If you are 72 years of age or older, have your blood pressure checked every year. You should have your blood pressure measured twice-once when you are at a hospital or clinic, and once when you are not at a hospital or clinic. Record the average of the two measurements. To check your blood pressure  when you are not at a hospital or clinic, you can use: ? An automated blood pressure machine at a pharmacy. ? A home blood pressure monitor.  Talk to your health care provider about your target blood pressure.  If you are between 40-48 years old, ask your health care provider if you should take aspirin to prevent heart disease.  Have regular diabetes screenings by checking your fasting blood sugar level. ? If you are at a normal weight and have a low risk for diabetes, have this test once every three years after the age of 28. ? If you are overweight and have a high risk for diabetes, consider being tested at a younger age or more often.  A one-time screening for abdominal aortic aneurysm (AAA) by ultrasound is recommended for men aged 32-75 years who are current or former smokers. What should I know about preventing infection? Hepatitis B If you have a higher risk for hepatitis B, you should be screened for this virus. Talk with your health care provider to find out if you are at risk for hepatitis B infection. Hepatitis C Blood testing is recommended for:  Everyone born from [birth year] through 1966.  Anyone with known risk factors for hepatitis C.  Sexually Transmitted Diseases (STDs)  You should be screened each year for STDs including gonorrhea and chlamydia if: ? You are sexually active and are younger than 77 years of age. ? You are older than 77 years of age and your health care provider tells you that you are at risk for this type of infection. ? Your sexual activity has changed since you were last screened and you are at an increased risk for chlamydia or gonorrhea. Ask your health care provider if you are at risk.  Talk with your health care provider about whether you are at high risk of being infected with HIV. Your health care provider may recommend a prescription medicine to help prevent HIV infection.  What else can I do?  Schedule regular health, dental, and eye  exams.  Stay current with your vaccines (immunizations).  Do not use any tobacco products, such as cigarettes, chewing tobacco, and e-cigarettes. If you need help quitting, ask your health care provider.  Limit alcohol intake to no more than 2 drinks per day. One drink equals 12 ounces of beer, 5 ounces of wine, or 1 ounces of hard liquor.  Do not use street drugs.  Do not share needles.  Ask your health care provider for help if you need support or information about quitting drugs.  Tell your health care provider if you often feel depressed.  Tell your health care provider if you have ever been abused or do not feel safe at home. This information is not intended to replace advice given to you by your health care provider. Make sure you discuss any questions you have with your health care provider. Document Released: 05/18/2008 Document Revised: 07/19/2016 Document Reviewed: 08/24/2015 Elsevier Interactive  Patient Education  [employer].

## 2017-10-24 DIAGNOSIS — H40003 Preglaucoma, unspecified, bilateral: Secondary | ICD-10-CM | POA: Diagnosis not present

## 2017-10-30 LAB — PAIN MGMT, PROFILE 8 W/CONF, U
6 Acetylmorphine: NEGATIVE ng/mL (ref <10)
Alcohol Metabolites: POSITIVE ng/mL — AB (ref <500)
Alphahydroxyalprazolam: NEGATIVE ng/mL (ref <25)
Alphahydroxymidazolam: NEGATIVE ng/mL (ref <50)
Alphahydroxytriazolam: NEGATIVE ng/mL (ref <50)
Aminoclonazepam: NEGATIVE ng/mL (ref <25)
Amphetamines: NEGATIVE ng/mL (ref <500)
Benzodiazepines: NEGATIVE ng/mL (ref <100)
Buprenorphine, Urine: NEGATIVE ng/mL (ref <5)
Cocaine Metabolite: NEGATIVE ng/mL (ref <150)
Creatinine: 199.5 mg/dL
Ethyl Glucuronide (ETG): 13296 ng/mL — ABNORMAL HIGH (ref <500)
Ethyl Sulfate (ETS): 3325 ng/mL — ABNORMAL HIGH (ref <100)
Hydroxyethylflurazepam: NEGATIVE ng/mL (ref <50)
Lorazepam: NEGATIVE ng/mL (ref <50)
MDMA: NEGATIVE ng/mL (ref <500)
Marijuana Metabolite: NEGATIVE ng/mL (ref <20)
Nordiazepam: NEGATIVE ng/mL (ref <50)
Opiates: NEGATIVE ng/mL (ref <100)
Oxazepam: NEGATIVE ng/mL (ref <50)
Oxidant: NEGATIVE ug/mL (ref <200)
Oxycodone: NEGATIVE ng/mL (ref <100)
Temazepam: NEGATIVE ng/mL (ref <50)
pH: 6.9 (ref 4.5–9.0)

## 2017-10-31 ENCOUNTER — Ambulatory Visit (INDEPENDENT_AMBULATORY_CARE_PROVIDER_SITE_OTHER): Payer: PPO

## 2017-10-31 DIAGNOSIS — I6523 Occlusion and stenosis of bilateral carotid arteries: Secondary | ICD-10-CM

## 2017-11-02 LAB — VAS US CAROTID
Left CCA dist dias: 18 cm/s
Left CCA dist sys: 104 cm/s
Left CCA prox dias: 21 cm/s
Left CCA prox sys: 114 cm/s
Left ICA dist dias: -36 cm/s
Left ICA dist sys: -170 cm/s
Left ICA prox dias: -18 cm/s
Left ICA prox sys: -151 cm/s
RIGHT ECA DIAS: -18 cm/s
RIGHT VERTEBRAL DIAS: -15 cm/s
Right CCA prox dias: -17 cm/s
Right CCA prox sys: -82 cm/s
Right cca dist sys: -126 cm/s

## 2017-11-13 ENCOUNTER — Other Ambulatory Visit: Payer: Self-pay | Admitting: Internal Medicine

## 2017-11-21 ENCOUNTER — Other Ambulatory Visit: Payer: Self-pay | Admitting: Family Medicine

## 2017-12-18 ENCOUNTER — Other Ambulatory Visit: Payer: Self-pay

## 2017-12-18 NOTE — Telephone Encounter (Signed)
Parker Martinez from [facility] requesting refill epi pen. Last refilled # 1 x 1 on 03/15/15. Last annual 10/19/17.Please advise.

## 2017-12-19 ENCOUNTER — Other Ambulatory Visit: Payer: Self-pay

## 2017-12-19 NOTE — Telephone Encounter (Signed)
Last rx:  03/15/15, 1 device Last OV (CPE):  10/19/17 Next OV:  04/18/18

## 2017-12-20 MED ORDER — EPINEPHRINE 0.3 MG/0.3ML IJ SOAJ: 0.3000 mg | Freq: Once | INTRAMUSCULAR | 1 refills | Status: AC

## 2017-12-21 ENCOUNTER — Other Ambulatory Visit: Payer: Self-pay | Admitting: *Deleted

## 2017-12-21 DIAGNOSIS — G458 Other transient cerebral ischemic attacks and related syndromes: Secondary | ICD-10-CM

## 2017-12-21 DIAGNOSIS — I6523 Occlusion and stenosis of bilateral carotid arteries: Secondary | ICD-10-CM

## 2018-01-14 ENCOUNTER — Other Ambulatory Visit: Payer: Self-pay | Admitting: Family Medicine

## 2018-04-01 DIAGNOSIS — C61 Malignant neoplasm of prostate: Secondary | ICD-10-CM | POA: Diagnosis not present

## 2018-04-01 DIAGNOSIS — R3915 Urgency of urination: Secondary | ICD-10-CM | POA: Diagnosis not present

## 2018-04-01 DIAGNOSIS — R350 Frequency of micturition: Secondary | ICD-10-CM | POA: Diagnosis not present

## 2018-04-09 ENCOUNTER — Other Ambulatory Visit: Payer: Self-pay | Admitting: Internal Medicine

## 2018-04-09 ENCOUNTER — Telehealth: Payer: Self-pay

## 2018-04-09 MED ORDER — TIOTROPIUM BROMIDE MONOHYDRATE 18 MCG IN CAPS: ORAL_CAPSULE | RESPIRATORY_TRACT | 1 refills | Status: DC

## 2018-04-09 NOTE — Telephone Encounter (Signed)
Sent. Thanks.   

## 2018-04-09 NOTE — Telephone Encounter (Signed)
Patient came by the office and states he needs Spiriva refilled, he is out of this medication and needs to use it. Can this be filled today please. Uses [facility]. This is Dr Gutierrez's patient. Thank you. CB [phone] Kris Mouton, RMA

## 2018-04-16 ENCOUNTER — Ambulatory Visit (INDEPENDENT_AMBULATORY_CARE_PROVIDER_SITE_OTHER): Payer: PPO | Admitting: Family Medicine

## 2018-04-16 ENCOUNTER — Encounter: Payer: Self-pay | Admitting: Family Medicine

## 2018-04-16 VITALS — BP 126/78 | HR 82 | Temp 97.8°F | Ht 68.0 in | Wt 170.0 lb

## 2018-04-16 DIAGNOSIS — G8929 Other chronic pain: Secondary | ICD-10-CM

## 2018-04-16 DIAGNOSIS — J449 Chronic obstructive pulmonary disease, unspecified: Secondary | ICD-10-CM | POA: Diagnosis not present

## 2018-04-16 DIAGNOSIS — F17200 Nicotine dependence, unspecified, uncomplicated: Secondary | ICD-10-CM

## 2018-04-16 DIAGNOSIS — J441 Chronic obstructive pulmonary disease with (acute) exacerbation: Secondary | ICD-10-CM | POA: Diagnosis not present

## 2018-04-16 MED ORDER — PREDNISONE 20 MG PO TABS: ORAL_TABLET | ORAL | 0 refills | Status: DC

## 2018-04-16 MED ORDER — HYDROCODONE-ACETAMINOPHEN 5-325 MG PO TABS: 1.0000 | ORAL_TABLET | Freq: Four times a day (QID) | ORAL | 0 refills | Status: DC | PRN

## 2018-04-16 MED ORDER — ALBUTEROL SULFATE HFA 108 (90 BASE) MCG/ACT IN AERS: INHALATION_SPRAY | RESPIRATORY_TRACT | 3 refills | Status: DC

## 2018-04-16 NOTE — Assessment & Plan Note (Signed)
Anticipate mild exacerbation ongoing since allergy season. Will Rx prednisone taper, continue triple therapy (symbicort, spiriva) and albuterol PRN. Reviewed albuterol inhaler use. Refilled today.

## 2018-04-16 NOTE — Assessment & Plan Note (Signed)
If above treatment ineffective, discussed pulm referral. Pt agrees.

## 2018-04-16 NOTE — Assessment & Plan Note (Signed)
Strongly encouraged full smoking cessation.

## 2018-04-16 NOTE — Progress Notes (Signed)
BP 126/78 (BP Location: Left Arm, Patient Position: Sitting, Cuff Size: Normal)   Pulse 82   Temp 97.8 F (36.6 C) (Oral)   Ht 5\' 8"  (1.727 m)   Wt 170 lb (77.1 kg)   SpO2 97%   BMI 25.85 kg/m    CC: COPD f/u Subjective:    Patient ID: Parker Martinez, male    DOB: 1941/04/10, 77 y.o.   MRN: [id number]  HPI: Parker Martinez is a 77 y.o. male presenting on 04/16/2018 for COPD (Here for follow-up.)   Chronic pain on vicodin 5/325mg , last filled #50 10/2017. Takes this for chronic shoulder arthritis pain and calf claudication (followed by Dr Scot Dock). Requests refill. Tolerates this well without constipation, aware of sedation risks. Drinks 0-2 alcoholic beverages per day. Aware not to mix these.   Known severe COPDpresumed emphysemanot oxygen dependent - on spiriva and symbicort. Feels COPD is progressively worsening over the past month - noticing using rescue inhaler much more frequently - daily to several times a day. Increased dyspnea, increased productive cough, increased sputum production. Notes increased sinus drainage - despite daily allegra and singulair. flonase may have caused rebound congestion. He has started taking nasacort daily. This correlates with recent allergy season. Continued 1/2-1ppd smoker. No fevers/chills.  Spirometry 05/2016: Severe airway obstruction without significant response to bronchodilator. Pre: FVC 102%, FEV1 45%, ratio 0.33 Post: FVC 102%, FEV1 48%, ratio 0.36  Relevant past medical, surgical, family and social history reviewed and updated as indicated. Interim medical history since our last visit reviewed. Allergies and medications reviewed and updated. Outpatient Medications Prior to Visit  Medication Sig Dispense Refill  . alfuzosin (UROXATRAL) 10 MG 24 hr tablet Take 10 mg by mouth daily with breakfast.    . aspirin 81 MG tablet Take 81 mg by mouth every morning.     . budesonide-formoterol (SYMBICORT) 160-4.5 MCG/ACT inhaler Inhale 2 puffs  into the lungs 2 (two) times daily. 1 Inhaler 12  . cyclobenzaprine (FLEXERIL) 10 MG tablet Take 1 tablet (10 mg total) by mouth 2 (two) times daily as needed for muscle spasms. 30 tablet 0  . fexofenadine (ALLEGRA) 180 MG tablet Take 180 mg by mouth daily.    . montelukast (SINGULAIR) 10 MG tablet TAKE ONE (1) TABLET BY MOUTH EVERY NIGHTAT BEDTIME 30 tablet 11  . Multiple Vitamin (MULTIVITAMIN) tablet Take 1 tablet by mouth daily.    . pantoprazole (PROTONIX) 40 MG tablet TAKE ONE TABLET BY MOUTH DAILY BEFORE BREAKFAST 30 tablet 2  . pravastatin (PRAVACHOL) 40 MG tablet TAKE 1 TABLET BY MOUTH DAILY 30 tablet 11  . ranitidine (ZANTAC) 150 MG tablet Take 150 mg by mouth daily.    Marland Kitchen tiotropium (SPIRIVA HANDIHALER) 18 MCG inhalation capsule PLACE 1 CAPSULE INTO INHALER AND INHALE ONCE DAILY AS DIRECTED 30 capsule 1  . vitamin B-12 (CYANOCOBALAMIN) 500 MCG tablet Take 500 mcg by mouth every morning.     Marland Kitchen albuterol (VENTOLIN HFA) 108 (90 Base) MCG/ACT inhaler USE 2 PUFFS EVERY 6 HOURS AS NEEDED FOR WHEEZING 18 Inhaler 2  . HYDROcodone-acetaminophen (NORCO/VICODIN) 5-325 MG tablet Take 1 tablet every 6 (six) hours as needed by mouth for moderate pain. 50 tablet 0   Facility-Administered Medications Prior to Visit  Medication Dose Route Frequency Provider Last Rate Last Dose  . 0.9 %  sodium chloride infusion  500 mL Intravenous Continuous Gatha Mayer, MD         Per HPI unless specifically indicated in ROS section below  Review of Systems     Objective:    BP 126/78 (BP Location: Left Arm, Patient Position: Sitting, Cuff Size: Normal)   Pulse 82   Temp 97.8 F (36.6 C) (Oral)   Ht 5\' 8"  (1.727 m)   Wt 170 lb (77.1 kg)   SpO2 97%   BMI 25.85 kg/m   Wt Readings from Last 3 Encounters:  04/16/18 170 lb (77.1 kg)  10/19/17 158 lb (71.7 kg)  10/09/17 158 lb 4 oz (71.8 kg)    Physical Exam  Constitutional: He appears well-developed and well-nourished. No distress.  HENT:    Mouth/Throat: Oropharynx is clear and moist. No oropharyngeal exudate.  Cardiovascular: Normal rate, regular rhythm and normal heart sounds.  No murmur heard. Pulmonary/Chest: Effort normal. No respiratory distress. He has decreased breath sounds. He has wheezes. He has no rhonchi. He has no rales.  Tight air movement Diffuse exp wheezing Prolonged expiratory phase  Skin: Skin is warm.  Nursing note and vitals reviewed.      Assessment & Plan:   Problem List Items Addressed This Visit    COPD exacerbation ([place])    Anticipate mild exacerbation ongoing since allergy season. Will Rx prednisone taper, continue triple therapy (symbicort, spiriva) and albuterol PRN. Reviewed albuterol inhaler use. Refilled today.       Relevant Medications   predniSONE (DELTASONE) 20 MG tablet   albuterol (VENTOLIN HFA) 108 (90 Base) MCG/ACT inhaler   COPD, severe (HCC)    If above treatment ineffective, discussed pulm referral. Pt agrees.       Relevant Medications   predniSONE (DELTASONE) 20 MG tablet   albuterol (VENTOLIN HFA) 108 (90 Base) MCG/ACT inhaler   Current smoker    Strongly encouraged full smoking cessation.       Encounter for chronic pain management - Primary    [place] CSRS reviewed Last filled 10/2017.  Reviewed benefits and risks of narcotic therapy. Aware not to mix narcotic with alcohol.           Meds ordered this encounter  Medications  . HYDROcodone-acetaminophen (NORCO/VICODIN) 5-325 MG tablet    Sig: Take 1 tablet by mouth every 6 (six) hours as needed for moderate pain.    Dispense:  50 tablet    Refill:  0    For chronic pain  . predniSONE (DELTASONE) 20 MG tablet    Sig: Take two tablets daily for 3 days followed by one tablet daily for 4 days    Dispense:  10 tablet    Refill:  0  . albuterol (VENTOLIN HFA) 108 (90 Base) MCG/ACT inhaler    Sig: USE 2 PUFFS EVERY 6 HOURS AS NEEDED FOR WHEEZING    Dispense:  18 Inhaler    Refill:  3   No orders of the  defined types were placed in this encounter.   Follow up plan: No follow-ups on file.  Ria Bush, MD

## 2018-04-16 NOTE — Patient Instructions (Addendum)
Cancel Thursday's appointment.  Continue nasacort as well as other medications.  Take prednisone taper and then update me with effect. If ongoing shortness of breath despite triple therapy treatment, we will refer you to lung doctor.  You need to quit smoking.

## 2018-04-16 NOTE — Assessment & Plan Note (Addendum)
[place] CSRS reviewed Last filled 10/2017.  Reviewed benefits and risks of narcotic therapy. Aware not to mix narcotic with alcohol.

## 2018-04-18 ENCOUNTER — Ambulatory Visit: Payer: PPO | Admitting: Family Medicine

## 2018-04-24 NOTE — Telephone Encounter (Signed)
Per med list Dr Darnell Level refilled 12/20/17.

## 2018-05-01 ENCOUNTER — Telehealth: Payer: Self-pay

## 2018-05-01 DIAGNOSIS — J449 Chronic obstructive pulmonary disease, unspecified: Secondary | ICD-10-CM

## 2018-05-01 MED ORDER — PREDNISONE 20 MG PO TABS: ORAL_TABLET | ORAL | 0 refills | Status: DC

## 2018-05-01 MED ORDER — DOXYCYCLINE HYCLATE 100 MG PO TABS: 100.0000 mg | ORAL_TABLET | Freq: Two times a day (BID) | ORAL | 0 refills | Status: DC

## 2018-05-01 NOTE — Telephone Encounter (Signed)
Copied from [place] [id number]. Topic: General - Other >> May 01, 2018 12:10 PM Mcneil, Ja-Kwan wrote: Reason for CRM: Pt states he is still experiencing COPD and he would like a call back from the nurse. Cb# [phone]   >> May 01, 2018 12:13 PM Modena Nunnery, CMA wrote: Attempted to contact pt; line busy

## 2018-05-01 NOTE — Telephone Encounter (Signed)
Spoke with patient.  Ongoing postnasal drainage, dyspnea, wheezing, productive cough. Ongoing dyspnea worse with hot weather. Prednisone was not very helpful. No fever.  Continues spiriva and symbicort.  Advised stay in [facility], will rpt prednisone taper and add doxycycline course. Will refer to pulm for severe COPD despite triple therapy.

## 2018-05-01 NOTE — Telephone Encounter (Signed)
Spoke with pt.  States he was seen recently for COPD.  Says he has been in bed for the last 4 days. Says Dr. Darnell Level mentioned maybe treating with abx or referral to pulmonology, which pt is agreeing to if that is the decision.  Pt would like to see Dr. Alva Garnet in [place].

## 2018-05-02 NOTE — Telephone Encounter (Signed)
Pulmonary Appt made with Dr Alva Garnet on 05/06/18 and patient notified

## 2018-05-03 ENCOUNTER — Other Ambulatory Visit: Payer: Self-pay | Admitting: Family Medicine

## 2018-05-06 ENCOUNTER — Ambulatory Visit: Payer: PPO | Admitting: Pulmonary Disease

## 2018-05-06 ENCOUNTER — Encounter: Payer: Self-pay | Admitting: Pulmonary Disease

## 2018-05-06 VITALS — BP 138/80 | HR 97 | Ht 68.0 in | Wt 161.0 lb

## 2018-05-06 DIAGNOSIS — F17200 Nicotine dependence, unspecified, uncomplicated: Secondary | ICD-10-CM

## 2018-05-06 DIAGNOSIS — R062 Wheezing: Secondary | ICD-10-CM | POA: Diagnosis not present

## 2018-05-06 DIAGNOSIS — J449 Chronic obstructive pulmonary disease, unspecified: Secondary | ICD-10-CM | POA: Diagnosis not present

## 2018-05-06 DIAGNOSIS — R0609 Other forms of dyspnea: Secondary | ICD-10-CM | POA: Diagnosis not present

## 2018-05-06 DIAGNOSIS — R0600 Dyspnea, unspecified: Secondary | ICD-10-CM

## 2018-05-06 MED ORDER — TIOTROPIUM BROMIDE MONOHYDRATE 2.5 MCG/ACT IN AERS: 2.0000 | INHALATION_SPRAY | Freq: Every day | RESPIRATORY_TRACT | 10 refills | Status: DC

## 2018-05-06 NOTE — Patient Instructions (Signed)
We discussed smoking cessation in detail.  We agreed that you are going to make an attempt, either cold [place] or with nicotine replacement therapy.  If you are unable to quit with these methods, we will discuss trying Chantix at the next visit  Continue Symbicort inhaler  Change Spiriva to Respimat device  Continue albuterol inhaler as needed for increased shortness of breath, chest tightness, wheezing, cough  Complete prednisone and doxycycline as recently prescribed  Follow-up in 3 to 4 weeks with lung function tests (PFTs) and chest x-ray prior to that visit

## 2018-05-06 NOTE — Progress Notes (Signed)
PULMONARY CONSULT NOTE  Requesting MD/Service: [name] Date of initial consultation: 05/06/18 Reason for consultation: Severe COPD, smoker  PT PROFILE: 77 y.o. male smoker (previously > 1 PPD, presently 1/2 PPD) with diagnosis of COPD rendered several years ago and with progressive DOE over past 5-7 years  DATA: 10/19/10 Spirometry: Severe obstruction (FEV1 41% predicted)  INTERVAL:  HPI:  In his own words, he is here because he "cannot breathe".  He describes progressive exertional dyspnea over the past several years.  It is made worse by heat and extreme cold.  It is also may be worse during pollen seasons.  In addition, he notes chronic cough productive of minimal sputum.  He also reports chronic posterior nasal drainage. He denies hemoptysis and chest pain.  He denies lower extremity edema and calf tenderness.  He has been a smoker all of his adult life as documented above.  Presently he smokes approximately one half PPD.  He has no significant occupational or environmental exposures.  He has never been hospitalized for COPD.  He has made no concerted efforts at smoking cessation.  In the past few years, he has been on various ICS/LABA formulations (presently Symbicort) and believes that these are mildly beneficial at best.  He is also on Spiriva HandiHaler but has difficulty generating sufficient inspiratory flow to utilize this inhaler.  He uses an albuterol rescue inhaler several times during the day.  He is on Singulair but is uncertain why this medication was prescribed.  He is also on fexofenadine and ranitidine for episodic anaphylaxis.   He has recently been prescribed doxycycline and prednisone for acute worsening of the above symptoms    Past Medical History:  Diagnosis Date  . Abnormal drug screen 10/2013, 03/2015   low Cr, +EtOH, neg hydrocodone (10/2013, again 03/2015)  . Allergic rhinitis   . Arthritis    lumbar and cervical spine  . Carotid stenosis 09/11/2014   40-59% bilat ICA stenosis, rpt 1 yr (09/2014)   . COPD (chronic obstructive pulmonary disease) (HCC)    severe, participated in [place] study  . DDD (degenerative disc disease)    lumbar (HNP R L3/4) s/p ESI, cervical  . Diverticulitis 2015   by CT  . Diverticulosis 2008   severe by colonoscopy and CT  . Duodenitis   . Esophageal stricture 2012   s/p dilation  . GERD (gastroesophageal reflux disease)   . Hemangioma    congenital left arm  . History of kidney stones remote  . HTN (hypertension)    borderline readings at home  . Idiopathic anaphylaxis    h/o recurrent anaphylaxis, unknown trigger, s/p eval at [facility] [phone]), last episode 03/2011  . PAD (peripheral artery disease) ([place])   . Personal history of colonic adenomas 09/02/2013  . Prostate cancer [facility]) 2011   prostate seed implant (Dr. [name])  . Smoker   . Status post dilatation of esophageal stricture   . Subclavian steal syndrome 10/12/2016   Left subclavian steal, with 18 mmHg brachial artery pressure gradient (10/2016). If symptomatic, rec PV consult    Past Surgical History:  Procedure Laterality Date  . BALLOON DILATION N/A 10/02/2014   Procedure: BALLOON DILATION;  Surgeon: Gatha Mayer, MD;  Location: [facility];  Service: Endoscopy;  Laterality: N/A;  . COLONOSCOPY  08/2013   4 tubular adenomas, severe divertic, rpt 3 yrs [name])  . COLONOSCOPY  10/2016   severe diverticulosis, no f/u recommended [name])  . ESOPHAGOGASTRODUODENOSCOPY  10/2016   2  rings dilated [name])  . ESOPHAGOGASTRODUODENOSCOPY (EGD) WITH PROPOFOL N/A 10/02/2014   WNL, mild esophagitis; Gatha Mayer, MD  . INSERTION PROSTATE RADIATION SEED  01/2015   [name]  . SHOULDER SURGERY  1961   due to chronic dislocation  . SPIROMETRY  10/2010   severe obstruction, FEV1 41%, ratio 0.46  . TONSILLECTOMY  1944 ?  . UPPER GASTROINTESTINAL ENDOSCOPY  06/08/2009   dilated esophageal stricture, duodenitis  . virtual  colonoscopy  2007   [name], rpt rec 5 yrs    MEDICATIONS: I have reviewed all medications and confirmed regimen as documented  Social History   Socioeconomic History  . Marital status: Married    Spouse name: Not on file  . Number of children: 1  . Years of education: Not on file  . Highest education level: Not on file  Occupational History  . Occupation: retired    Fish farm manager: RETIRED  Social Needs  . Financial resource strain: Not on file  . Food insecurity:    Worry: Not on file    Inability: Not on file  . Transportation needs:    Medical: Not on file    Non-medical: Not on file  Tobacco Use  . Smoking status: Current Every Day Smoker    Packs/day: 0.50    Years: 60.00    Pack years: 30.00    Types: Cigarettes  . Smokeless tobacco: Never Used  . Tobacco comment: would like to quit not ready to. use vapor cig  Substance and Sexual Activity  . Alcohol use: Yes    Alcohol/week: 8.4 oz    Types: 14 Cans of beer per week    Comment: socially  . Drug use: No  . Sexual activity: Not on file  Lifestyle  . Physical activity:    Days per week: Not on file    Minutes per session: Not on file  . Stress: Not on file  Relationships  . Social connections:    Talks on phone: Not on file    Gets together: Not on file    Attends religious service: Not on file    Active member of club or organization: Not on file    Attends meetings of clubs or organizations: Not on file    Relationship status: Not on file  . Intimate partner violence:    Fear of current or ex partner: Not on file    Emotionally abused: Not on file    Physically abused: Not on file    Forced sexual activity: Not on file  Other Topics Concern  . 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 Wife is Ladean Raya. "no extraordinary efforts" does not want feeding tube.      Tested negative for hydrocodone and positive for alcohol on UDS 10/2013.  Recommend Q6 mo testing and will need to discuss EtOH use at next visit.    Family History  Problem Relation Age of Onset  . Lung cancer Father   . Hyperlipidemia Father   . Arthritis Mother   . Breast cancer Daughter   . Coronary artery disease Neg Hx   . Stroke Neg Hx   . Diabetes Neg Hx  ROS: No fever, myalgias/arthralgias, unexplained weight loss or weight gain No new focal weakness or sensory deficits No otalgia, hearing loss, visual changes, nasal and sinus symptoms, mouth and throat problems No neck pain or adenopathy No abdominal pain, N/V/D, diarrhea, change in bowel pattern No dysuria, change in urinary pattern   Vitals:   05/06/18 1057 05/06/18 1103  BP:  138/80  Pulse:  97  SpO2:  96%  Weight: 161 lb (73 kg)   Height: 5\' 8"  (1.727 m)      EXAM:  Gen: Appears older than true age, No overt respiratory distress at rest HEENT: NCAT, sclera white, oropharynx normal Neck: Supple without LAN, thyromegaly.  JVP not visualized Lungs: breath sounds mildly diminished with diffuse scattered coarse wheezes.  Hyperresonant to percussion. Cardiovascular: RRR, no murmurs noted Abdomen: Soft, nontender, normal BS Ext: without clubbing, cyanosis, edema Neuro: CNs grossly intact, motor and sensory intact Skin: Limited exam, no lesions noted  DATA:   BMP Latest Ref Rng & Units 10/09/2017 09/22/2016 09/16/2015  Glucose 70 - 99 mg/dL 114(H) 94 94  BUN 6 - 23 mg/dL 9 7 9   Creatinine 0.40 - 1.50 mg/dL 0.80 0.84 0.77  Sodium 135 - 145 mEq/L 139 143 141  Potassium 3.5 - 5.1 mEq/L 4.3 4.7 4.3  Chloride 96 - 112 mEq/L 101 106 102  CO2 19 - 32 mEq/L 30 31 31   Calcium 8.4 - 10.5 mg/dL 9.9 9.8 10.1    CBC Latest Ref  Rng & Units 10/09/2017 09/22/2016 09/16/2015  WBC 4.0 - 10.5 K/uL 8.6 6.2 7.4  Hemoglobin 13.0 - 17.0 g/dL 14.7 14.4 16.0  Hematocrit 39.0 - 52.0 % 43.6 42.0 48.2  Platelets 150.0 - 400.0 K/uL 269.0 244.0 233.0    CXR:    I have personally reviewed all chest radiographs reported above including CXRs and CT chest unless otherwise indicated  IMPRESSION:     ICD-10-CM   1. Smoker F17.200 DG Chest 2 View    Pulmonary Function Test [facility] Only  2. COPD, severe ([place]) J44.9 DG Chest 2 View    Pulmonary Function Test [facility] Only  3. Severe exertional dyspnea, likely fully explained by COPD R06.09   4. Wheezing R06.2    I am concerned that he is getting no benefit from tiotropium due to inability to generate sufficient ventilatory flow.  Most of our encounter involved discussion regarding the need for smoking cessation and strategies to achieve this.  PLAN:  We discussed smoking cessation in detail.  We agreed that you are going to make an attempt, either cold [place] or with nicotine replacement therapy.  If he is unable to quit with these methods, we will discuss initiating Chantix at the next visit  Continue Symbicort inhaler  Change Spiriva HandiHaler to Respimat device  Continue albuterol inhaler as needed for increased shortness of breath, chest tightness, wheezing, cough  Complete prednisone and doxycycline as recently prescribed  Follow-up in 3 to 4 weeks with lung function tests (PFTs) and chest x-ray prior to that visit   Merton Border, MD PCCM service Mobile [phone] Pager [phone] 05/06/2018 2:39 PM

## 2018-05-28 ENCOUNTER — Ambulatory Visit (HOSPITAL_COMMUNITY): Payer: PPO

## 2018-05-28 ENCOUNTER — Ambulatory Visit
Admission: RE | Admit: 2018-05-28 | Discharge: 2018-05-28 | Disposition: A | Discharge status: Home / Self Care | Payer: PPO | Source: Ambulatory Visit | Attending: Pulmonary Disease | Admitting: Pulmonary Disease

## 2018-05-28 DIAGNOSIS — J449 Chronic obstructive pulmonary disease, unspecified: Secondary | ICD-10-CM

## 2018-05-28 DIAGNOSIS — F17200 Nicotine dependence, unspecified, uncomplicated: Secondary | ICD-10-CM | POA: Insufficient documentation

## 2018-05-28 MED ORDER — ALBUTEROL SULFATE (2.5 MG/3ML) 0.083% IN NEBU
2.5000 mg | INHALATION_SOLUTION | Freq: Once | RESPIRATORY_TRACT | Status: AC
  Administered 2018-05-28: 2.5 mg via RESPIRATORY_TRACT
  Filled 2018-05-28: qty 3

## 2018-06-03 ENCOUNTER — Encounter: Payer: Self-pay | Admitting: Pulmonary Disease

## 2018-06-03 ENCOUNTER — Ambulatory Visit: Payer: PPO | Admitting: Pulmonary Disease

## 2018-06-03 VITALS — BP 128/66 | HR 104 | Ht 68.0 in | Wt 167.0 lb

## 2018-06-03 DIAGNOSIS — J449 Chronic obstructive pulmonary disease, unspecified: Secondary | ICD-10-CM | POA: Diagnosis not present

## 2018-06-03 DIAGNOSIS — F17200 Nicotine dependence, unspecified, uncomplicated: Secondary | ICD-10-CM | POA: Diagnosis not present

## 2018-06-03 NOTE — Progress Notes (Signed)
PULMONARY OFFICE FOLLOW-UP NOTE  Requesting MD/Service: [name] Date of initial consultation: 05/06/18 Reason for consultation: Severe COPD, smoker  PT PROFILE: 78 y.o. male smoker (previously > 1 PPD, presently 1/2 PPD) with diagnosis of COPD rendered several years ago and with progressive DOE over past 5-7 years  DATA: 10/19/10 Spirometry: Severe obstruction (FEV1 41% predicted) 05/28/18 PFTs: FVC: 2.13 > 2.96 L (58 > 80 %pred), FEV1: 0.85 > 1.04 L (30 > 36 %pred), FEV1/FVC: 40 >35%, TLC: 7.90 L (128 %pred), DLCO 50 %pred.  Flow volume curve consistent with severe obstruction   INTERVAL: No major events   SUBJ:  Overall, no major changes.  Continues to smoke 6 to 8 cigarettes/day.  He is using nicotine lozenges.  He does not want to try Chantix.  He believes the change from Spiriva HandiHaler to Respimat has been beneficial.  He is rarely using his albuterol rescue inhaler, reserving it for "crises".  Continues to have moderate to severe exertional dyspnea. Denies CP, fever, purulent sputum, hemoptysis, LE edema and calf tenderness.   Vitals:   06/03/18 0948 06/03/18 0953  BP:  128/66  Pulse:  (!) 104  SpO2:  96%  Weight: 167 lb (75.8 kg)   Height: 5\' 8"  (1.727 m)   RA   EXAM:  Gen: NAD at rest HEENT: WNL Neck: No JVD Lungs: BS moderately diminished, few scattered coarse wheezes Cardiovascular: RRR, no murmurs noted Abdomen: Soft, nontender, normal BS Ext: no C/C/E Neuro: No focal deficits Skin: Limited exam, no lesions noted  DATA:   BMP Latest Ref Rng & Units 10/09/2017 09/22/2016 09/16/2015  Glucose 70 - 99 mg/dL 114(H) 94 94  BUN 6 - 23 mg/dL 9 7 9   Creatinine 0.40 - 1.50 mg/dL 0.80 0.84 0.77  Sodium 135 - 145 mEq/L 139 143 141  Potassium 3.5 - 5.1 mEq/L 4.3 4.7 4.3  Chloride 96 - 112 mEq/L 101 106 102  CO2 19 - 32 mEq/L 30 31 31   Calcium 8.4 - 10.5 mg/dL 9.9 9.8 10.1    CBC Latest Ref Rng & Units 10/09/2017 09/22/2016 09/16/2015  WBC 4.0 - 10.5 K/uL 8.6  6.2 7.4  Hemoglobin 13.0 - 17.0 g/dL 14.7 14.4 16.0  Hematocrit 39.0 - 52.0 % 43.6 42.0 48.2  Platelets 150.0 - 400.0 K/uL 269.0 244.0 233.0    CXR 6/25: Hyperinflation, increased chronic markings, no acute findings  I have personally reviewed all chest radiographs reported above including CXRs and CT chest unless otherwise indicated  IMPRESSION:     ICD-10-CM   1. COPD, very severe ([place]) J44.9   2. Smoker F17.200      PLAN:  We again discussed smoking cessation in detail.  He does not wish to undertake a trial of Chantix.  He wants to continue to work with nicotine replacement therapy.  I suggested that the failure of NRT thus far is likely due to insufficient nicotine levels.  After discussion, I recommended nicotine patch at the 14 mg strength.  In addition, he may use 5-6 nicotine lozenges (2 mg strength) throughout the day as needed.  Continue Symbicort and Spiriva inhalers as previously prescribed  Continue albuterol inhaler as needed.  I encouraged more liberal use  Follow-up in 6 to 8-week   Merton Border, MD PCCM service Mobile [phone] Pager [phone] 06/03/2018 10:29 AM

## 2018-06-03 NOTE — Patient Instructions (Signed)
We discussed strategies of nicotine replacement therapy.  You decided to use nicotine patch plus lozenges.  I recommend they 14 mg strength patch-place in the morning, remove at bedtime.  You may also use up to 5 or 6 nicotine lozenges (2 mg strength) throughout the day as needed.  Continue Symbicort and Spiriva inhalers  Albuterol inhaler as needed.  I encouraged more liberal use  Follow-up in 6 to 8 weeks

## 2018-06-19 ENCOUNTER — Ambulatory Visit: Payer: PPO | Admitting: Vascular Surgery

## 2018-06-19 ENCOUNTER — Encounter: Payer: Self-pay | Admitting: Vascular Surgery

## 2018-06-19 ENCOUNTER — Ambulatory Visit (HOSPITAL_COMMUNITY)
Admission: RE | Admit: 2018-06-19 | Discharge: 2018-06-19 | Disposition: A | Discharge status: Home / Self Care | Payer: PPO | Source: Ambulatory Visit | Attending: Vascular Surgery | Admitting: Vascular Surgery

## 2018-06-19 VITALS — BP 114/62 | HR 73 | Temp 96.8°F | Resp 18 | Ht 68.0 in | Wt 166.0 lb

## 2018-06-19 DIAGNOSIS — J449 Chronic obstructive pulmonary disease, unspecified: Secondary | ICD-10-CM | POA: Insufficient documentation

## 2018-06-19 DIAGNOSIS — I10 Essential (primary) hypertension: Secondary | ICD-10-CM | POA: Diagnosis not present

## 2018-06-19 DIAGNOSIS — I70219 Atherosclerosis of native arteries of extremities with intermittent claudication, unspecified extremity: Secondary | ICD-10-CM

## 2018-06-19 DIAGNOSIS — Z87891 Personal history of nicotine dependence: Secondary | ICD-10-CM | POA: Insufficient documentation

## 2018-06-19 NOTE — Progress Notes (Signed)
Patient name: Parker Martinez MRN: [id number] DOB: 1941/08/09 Sex: male  REASON FOR VISIT:   Follow-up of peripheral vascular disease and carotid disease.  HPI:   Parker Martinez is a pleasant 77 y.o. male who I last saw on 06/14/2017.  At that time he had stable claudication of the right lower extremity.  ABI on the right was 48% with a toe pressure of 31.  On the left side ABI was 59% with a toe pressure of 66 mmHg.  Since I saw the patient last he has stable bilateral lower extremity calf claudication.  His symptoms are brought on by ambulation and relieved with rest.  His symptoms are equal on both sides.  He can walk about half a block before experiencing symptoms.  The symptoms have been stable over the last year.  He denies any history of rest pain or nonhealing ulcers.  He quit smoking 2 weeks ago.  Carotid duplex scan on 10/31/2017 showed a 40 to 59% right carotid stenosis with no significant stenosis on the left.  This was done at the cardiologist's office.  He denies any history of stroke, TIAs, expressive or receptive aphasia, or amaurosis fugax.  Past Medical History:  Diagnosis Date  . Abnormal drug screen 10/2013, 03/2015   low Cr, +EtOH, neg hydrocodone (10/2013, again 03/2015)  . Allergic rhinitis   . Arthritis    lumbar and cervical spine  . Carotid stenosis 09/11/2014   40-59% bilat ICA stenosis, rpt 1 yr (09/2014)   . COPD (chronic obstructive pulmonary disease) (HCC)    severe, participated in [place] study  . DDD (degenerative disc disease)    lumbar (HNP R L3/4) s/p ESI, cervical  . Diverticulitis 2015   by CT  . Diverticulosis 2008   severe by colonoscopy and CT  . Duodenitis   . Esophageal stricture 2012   s/p dilation  . GERD (gastroesophageal reflux disease)   . Hemangioma    congenital left arm  . History of kidney stones remote  . HTN (hypertension)    borderline readings at home  . Idiopathic anaphylaxis    h/o recurrent anaphylaxis, unknown trigger,  s/p eval at [facility] [phone]), last episode 03/2011  . PAD (peripheral artery disease) ([place])   . Personal history of colonic adenomas 09/02/2013  . Prostate cancer [facility]) 2011   prostate seed implant (Dr. [name])  . Smoker   . Status post dilatation of esophageal stricture   . Subclavian steal syndrome 10/12/2016   Left subclavian steal, with 18 mmHg brachial artery pressure gradient (10/2016). If symptomatic, rec PV consult    Family History  Problem Relation Age of Onset  . Lung cancer Father   . Hyperlipidemia Father   . Arthritis Mother   . Breast cancer Daughter   . Coronary artery disease Neg Hx   . Stroke Neg Hx   . Diabetes Neg Hx     SOCIAL HISTORY: Social History   Tobacco Use  . Smoking status: Former Smoker    Packs/day: 0.50    Years: 60.00    Pack years: 30.00    Types: Cigarettes    Last attempt to quit: 06/05/2018    Years since quitting: 0.0  . Smokeless tobacco: Never Used  . Tobacco comment: would like to quit not ready to. use vapor cig  Substance Use Topics  . Alcohol use: Yes    Alcohol/week: 8.4 oz    Types: 14 Cans of beer per week  Comment: socially    Allergies  Allergen Reactions  . Flonase [Fluticasone Propionate] Other (See Comments)    Per pt, causes rebound effect     Current Outpatient Medications  Medication Sig Dispense Refill  . albuterol (VENTOLIN HFA) 108 (90 Base) MCG/ACT inhaler USE 2 PUFFS EVERY 6 HOURS AS NEEDED FOR WHEEZING 18 Inhaler 3  . alfuzosin (UROXATRAL) 10 MG 24 hr tablet Take 10 mg by mouth daily with breakfast.    . aspirin 81 MG tablet Take 81 mg by mouth every morning.     . budesonide-formoterol (SYMBICORT) 160-4.5 MCG/ACT inhaler Inhale 2 puffs into the lungs 2 (two) times daily. 1 Inhaler 12  . cyclobenzaprine (FLEXERIL) 10 MG tablet Take 1 tablet (10 mg total) by mouth 2 (two) times daily as needed for muscle spasms. 30 tablet 0  . fexofenadine (ALLEGRA) 180 MG tablet Take 180 mg by mouth  daily.    Marland Kitchen HYDROcodone-acetaminophen (NORCO/VICODIN) 5-325 MG tablet Take 1 tablet by mouth every 6 (six) hours as needed for moderate pain. 50 tablet 0  . montelukast (SINGULAIR) 10 MG tablet TAKE ONE (1) TABLET BY MOUTH EVERY NIGHTAT BEDTIME 30 tablet 11  . Multiple Vitamin (MULTIVITAMIN) tablet Take 1 tablet by mouth daily.    . pantoprazole (PROTONIX) 40 MG tablet TAKE ONE TABLET BY MOUTH DAILY BEFORE BREAKFAST 30 tablet 2  . pravastatin (PRAVACHOL) 40 MG tablet TAKE 1 TABLET BY MOUTH DAILY 30 tablet 11  . ranitidine (ZANTAC) 150 MG tablet Take 150 mg by mouth daily.    . Tiotropium Bromide Monohydrate (SPIRIVA RESPIMAT) 2.5 MCG/ACT AERS Inhale 2 puffs into the lungs daily. 4 g 10  . vitamin B-12 (CYANOCOBALAMIN) 500 MCG tablet Take 500 mcg by mouth every morning.      Current Facility-Administered Medications  Medication Dose Route Frequency Provider Last Rate Last Dose  . 0.9 %  sodium chloride infusion  500 mL Intravenous Continuous Gatha Mayer, MD        REVIEW OF SYSTEMS:  [X]  denotes positive finding, [ ]  denotes negative finding Cardiac  Comments:  Chest pain or chest pressure:    Shortness of breath upon exertion:    Short of breath when lying flat:    Irregular heart rhythm:        Vascular    Pain in calf, thigh, or hip brought on by ambulation:    Pain in feet at night that wakes you up from your sleep:     Blood clot in your veins:    Leg swelling:         Pulmonary    Oxygen at home:    Productive cough:     Wheezing:         Neurologic    Sudden weakness in arms or legs:     Sudden numbness in arms or legs:     Sudden onset of difficulty speaking or slurred speech:    Temporary loss of vision in one eye:     Problems with dizziness:         Gastrointestinal    Blood in stool:     Vomited blood:         Genitourinary    Burning when urinating:     Blood in urine:        Psychiatric    Major depression:         Hematologic    Bleeding  problems:    Problems with blood clotting too easily:  Skin    Rashes or ulcers:        Constitutional    Fever or chills:     PHYSICAL EXAM:   Vitals:   06/19/18 1246  BP: 114/62  Pulse: 73  Resp: 18  Temp: (!) 96.8 F (36 C)  TempSrc: Oral  SpO2: 97%  Weight: 166 lb (75.3 kg)  Height: 5\' 8"  (1.727 m)    GENERAL: The patient is a well-nourished male, in no acute distress. The vital signs are documented above. CARDIAC: There is a regular rate and rhythm.  VASCULAR: He has bilateral carotid bruits. I cannot palpate femoral or pedal pulses bilaterally. He has no significant lower extremity swelling. PULMONARY: There is good air exchange bilaterally without wheezing or rales. ABDOMEN: Soft and non-tender with normal pitched bowel sounds.  I do not palpate an aneurysm.   MUSCULOSKELETAL: There are no major deformities or cyanosis. NEUROLOGIC: No focal weakness or paresthesias are detected. SKIN: There are no ulcers or rashes noted. PSYCHIATRIC: The patient has a normal affect.  DATA:    ARTERIAL DOPPLER STUDY: I have independently interpreted his arterial Doppler study today.  On the right side there are monophasic Doppler signals in the dorsalis pedis and posterior tibial positions.  ABI is 59% with a toe pressure 42 mmHg.  On the left side, there is a monophasic dorsalis pedis and posterior tibial signal.  ABI is 61% with a toe pressure of 58 mmHg.  These ABIs are stable compared to the study a year ago.  MEDICAL ISSUES:   PERIPHERAL VASCULAR DISEASE: This patient has stable bilateral lower extremity calf claudication.  His symptoms have been stable for some time.  Fortunately he quit smoking 2 weeks ago and feels that he should be able to stay off of the nicotine.  I have encouraged him to walk is much as possible.  Given that his symptoms are stable I think it would be reasonable to follow him as needed.  If his symptoms progressed and I will be happy to see him  back at any time.  BILATERAL CAROTID BRUITS: Patient has a known 70 to 59% right carotid stenosis.  He is due for a follow-up carotid duplex scan in November of this year and is followed by Dr. Quay Burow.  He is on aspirin and is on a statin.  Deitra Mayo [facility] [phone]

## 2018-07-10 ENCOUNTER — Other Ambulatory Visit: Payer: Self-pay | Admitting: Internal Medicine

## 2018-07-22 ENCOUNTER — Encounter: Payer: Self-pay | Admitting: Pulmonary Disease

## 2018-07-22 ENCOUNTER — Ambulatory Visit: Payer: PPO | Admitting: Pulmonary Disease

## 2018-07-22 VITALS — BP 122/80 | HR 91 | Ht 68.0 in | Wt 167.0 lb

## 2018-07-22 DIAGNOSIS — J449 Chronic obstructive pulmonary disease, unspecified: Secondary | ICD-10-CM | POA: Diagnosis not present

## 2018-07-22 DIAGNOSIS — F17200 Nicotine dependence, unspecified, uncomplicated: Secondary | ICD-10-CM

## 2018-07-22 NOTE — Patient Instructions (Signed)
Congratulations on smoking cessation.  Keep up the good work  [employer], Spiriva inhalers  Continue albuterol as needed for increased shortness of breath, wheezing, chest tightness, cough  Follow-up in 6 months with lung function test prior to that visit  Call sooner as needed

## 2018-07-22 NOTE — Progress Notes (Signed)
PULMONARY OFFICE FOLLOW-UP NOTE  Requesting MD/Service: [name] Date of initial consultation: 05/06/18 Reason for consultation: Severe COPD, smoker  PT PROFILE: 77 y.o. male smoker (previously > 1 PPD, presently 1/2 PPD) with diagnosis of COPD rendered several years ago and with progressive DOE over past 5-7 years  DATA: 10/19/10 Spirometry: Severe obstruction (FEV1 41% predicted) 05/28/18 PFTs: FVC: 2.13 > 2.96 L (58 > 80 %pred), FEV1: 0.85 > 1.04 L (30 > 36 %pred), FEV1/FVC: 40 >35%, TLC: 7.90 L (128 %pred), DLCO 50 %pred.  Flow volume curve consistent with severe obstruction   INTERVAL: Last visit 06/03/2018.  No major pulmonary events   SUBJ:  This is a scheduled follow-up visit.  Since last visit, he has quit smoking.  He smoked his last cigarette on 07/02.  He believes that his cough has improved.  He has noted no significant change in exercise tolerance or dyspnea.  He remains on Symbicort and Spiriva inhalers which he uses compliantly.  He rarely uses his albuterol rescue inhaler.  He denies CP, fever, purulent sputum, hemoptysis, LE edema and calf tenderness.   Vitals:   07/22/18 1013 07/22/18 1016  BP:  122/80  Pulse:  91  SpO2:  95%  Weight: 167 lb (75.8 kg)   Height: 5\' 8"  (1.727 m)   RA   EXAM:  Gen: NAD HEENT: NCAT, sclera white Neck: No JVD Lungs: BS moderately diminished without adventitious sounds Cardiovascular: RRR, no murmurs Abdomen: Soft, nontender, normal BS Ext: without clubbing, cyanosis, edema Neuro: grossly intact Skin: Limited exam, no lesions noted   DATA:   BMP Latest Ref Rng & Units 10/09/2017 09/22/2016 09/16/2015  Glucose 70 - 99 mg/dL 114(H) 94 94  BUN 6 - 23 mg/dL 9 7 9   Creatinine 0.40 - 1.50 mg/dL 0.80 0.84 0.77  Sodium 135 - 145 mEq/L 139 143 141  Potassium 3.5 - 5.1 mEq/L 4.3 4.7 4.3  Chloride 96 - 112 mEq/L 101 106 102  CO2 19 - 32 mEq/L 30 31 31   Calcium 8.4 - 10.5 mg/dL 9.9 9.8 10.1    CBC Latest Ref Rng & Units  10/09/2017 09/22/2016 09/16/2015  WBC 4.0 - 10.5 K/uL 8.6 6.2 7.4  Hemoglobin 13.0 - 17.0 g/dL 14.7 14.4 16.0  Hematocrit 39.0 - 52.0 % 43.6 42.0 48.2  Platelets 150.0 - 400.0 K/uL 269.0 244.0 233.0    CXR: No new film  I have personally reviewed all chest radiographs reported above including CXRs and CT chest unless otherwise indicated  IMPRESSION:     ICD-10-CM   1. COPD, very severe ([place]) J44.9 Pulmonary Function Test [facility] Only  2. Smoker F17.200      PLAN:  I congratulated him on his success at smoking cessation thus far.  I cautioned him regarding the risk of relapse.  We discussed strategies to prevent relapse.  Continue Symbicort, Spiriva inhalers  Continue albuterol inhaler as needed  Follow-up in 6 months with PFTs prior to that visit.  Call sooner as needed.   Merton Border, MD PCCM service Mobile [phone] Pager [phone] 07/22/2018 3:58 PM

## 2018-08-01 ENCOUNTER — Other Ambulatory Visit: Payer: Self-pay | Admitting: *Deleted

## 2018-08-01 NOTE — Patient Outreach (Addendum)
[place] [facility]) Care Management  08/01/2018  KAYLUB DETIENNE 1941-06-11 [id number]   Telephone Screen  Referral Date: 07/29/18 Referral Source: HTA referral  Referral Reason: member is in the coverage gap and in need of financial assistance  Insurance: Health team advantage (HTA)  Outreach attempt # 1 successful at his home number Patient is able to verify HIPAA Reviewed and addressed referral to [facility] with patient  Social: Mr Frontera lives with his wife and reports being independent with his care needs He denies transportation concerns related to getting to medical appointments   Conditions: COPD, DOE, smoker, GERD, prostate cancer, dyslipidemia, vitamin D deficiency, HTN, PVD, DDD, hx of colonic adenomas, carotid stenosis   Medications:  He reports coverage gap concerns with Symbicort, and Spiriva plus questions if his albuterol is effective.  He would like cost efficient "medicines that work"    Appointments: last saw primary MD J [name]  04/03/18 He has a annual physical in 2019 Has been seeing Dr Alva Garnet Pulmonologist mostly Last seen on 07/22/18 Advance Directives: He reports having advance directives in place   Consent: [facility] RN CM reviewed [facility] services with patient. Patient gave verbal consent for services for [place].  Plan: [facility] RN CM will refer Mr Barocio to [facility] for polypharmacy, assistance with coverage gap related to Symbicort and Spiriva plus question about the effectiveness of albuterol inhaler  Kimberly L. Lavina Hamman, RN, BSN, [place] Management Care Coordinator Direct Number [phone] Mobile number [phone]  Main [facility] number [id number] Fax number [phone]

## 2018-08-07 ENCOUNTER — Ambulatory Visit: Payer: Self-pay | Admitting: Pharmacist

## 2018-08-08 ENCOUNTER — Ambulatory Visit: Payer: Self-pay | Admitting: Pharmacist

## 2018-08-08 ENCOUNTER — Other Ambulatory Visit: Payer: Self-pay | Admitting: Pharmacist

## 2018-08-08 NOTE — Patient Outreach (Signed)
[place] [facility]) Care Management  08/08/2018  Parker Martinez Nov 15, 1941 [id number]  77 year old male referred to [facility] via HTA concierge for medication assistance. PMH significant for: COPD, HTN, PVD, GERD, DDD, vit D deficiency.  SUBJECTIVE: Successful patient outreach call today to Parker Martinez with HIPAA identifiers verified.  He is agreeable to reviewing medication and allergies telephonically.  Patient reports he is in the coverage gap and cannot afford the following: Spiriva Respimat, Symbicort, & Ventolin HFA.  He also states that he would prefer brand name Ventolin HFA as this is the only formulation that works for him.  He prefers the delivery system that Spiriva Respimat provides as he has not had the same success with Spiriva Handihaler.  OBJECTIVE: Medications Reviewed Today    Reviewed by Lavera Guise, [facility] (Pharmacist) on 08/08/18 at [place] List Status: <None>  Medication Order Taking? Sig Documenting Provider Last Dose Status Informant  0.9 %  sodium chloride infusion [id number]   Gatha Mayer, MD  Active   albuterol (VENTOLIN HFA) 108 (90 Base) MCG/ACT inhaler [id number]  USE 2 PUFFS EVERY 6 HOURS AS NEEDED FOR WHEEZING Ria Bush, MD  Active   alfuzosin (UROXATRAL) 10 MG 24 hr tablet [id number] Yes Take 10 mg by mouth daily with breakfast. [provider] Taking Active   aspirin 81 MG tablet [id number] Yes Take 81 mg by mouth every morning.  [provider] Taking Active Self  budesonide-formoterol (SYMBICORT) 160-4.5 MCG/ACT inhaler [id number] Yes Inhale 2 puffs into the lungs 2 (two) times daily. Ria Bush, MD Taking Active   cyclobenzaprine (FLEXERIL) 10 MG tablet [id number] Yes Take 1 tablet (10 mg total) by mouth 2 (two) times daily as needed for muscle spasms. Ria Bush, MD Taking Active   fexofenadine [facility]) 180 MG tablet [id number] Yes Take 180 mg by mouth daily. [provider] Taking Active    HYDROcodone-acetaminophen (NORCO/VICODIN) 5-325 MG tablet [id number] Yes Take 1 tablet by mouth every 6 (six) hours as needed for moderate pain. Ria Bush, MD Taking Active   montelukast (SINGULAIR) 10 MG tablet [id number] Yes TAKE ONE (1) TABLET BY MOUTH EVERY NIGHTAT BEDTIME Ria Bush, MD Taking Active   Multiple Vitamin (MULTIVITAMIN) tablet [id number] Yes Take 1 tablet by mouth daily. [provider] Taking Active   pantoprazole (PROTONIX) 40 MG tablet [id number] Yes TAKE ONE TABLET BY MOUTH DAILY BEFORE BREAKFAST Gatha Mayer, MD Taking Active   pravastatin (PRAVACHOL) 40 MG tablet [id number] Yes TAKE 1 TABLET BY MOUTH DAILY Ria Bush, MD Taking Active   ranitidine (ZANTAC) 150 MG tablet [id number] Yes Take 150 mg by mouth daily. [provider] Taking Active   Tiotropium Bromide Monohydrate (SPIRIVA RESPIMAT) 2.5 MCG/ACT AERS [id number] Yes Inhale 2 puffs into the lungs daily. Wilhelmina Mcardle, MD Taking Active   vitamin B-12 (CYANOCOBALAMIN) 500 MCG tablet [id number] Yes Take 500 mcg by mouth every morning.  [provider] Taking Active Self         ASSESSMENT: Drugs sorted by system:  Cardiovascular: aspirin, pravastatin  Pulmonary/Allergy: Spiriva Respimat, Symbicort, Ventolin HFA, Singulair, Allegra  Gastrointestinal: pantoprazole, ranitidine  Pain: hydrocodone/APAP  Vitamins/Minerals: MVI, vit D, vit B  Miscellaneous: alfuzosin, cyclobenzaprine   Medications to avoid in the elderly:   Cyclobenzaprine: Not recommended for Geriatric Patients Per [place] list, most muscle relaxants are poorly tolerated by elderly patients, due to anticholinergic adverse effects, sedation, and increased risk of fractures. Additionally, their effectiveness at doses tolerated by elderly  patients is questionable. This medication, as a single agent or as part of a combination product, has been identified as a high-risk medication in patients 65 years and  older on the [place] Use of High-Risk Medications in the Elderly performance measure. Use in the elderly only if clearly needed. Initiate at a lower dose and titrate slowly upward.    Medication assistance:  Patient unable to afford the following medications  1.Spiriva Respimat 2. Symbicort 3. Ventolin HFA  Assistance programs reviewed with patient.  -Extra Help:patient does not qualify for LIS/Extra Help given his reported income & assets -PAPs:        -[employer]. Patient meets income requirements.  No TROOP      is required.      -Horticulturist, commercial.  Patient reports spending 3% of income on           medications.  TROOP obtained via [place] manufacturers Ventolin HFA.  Patient meets income & TROOP requirements.    Plan: -I will route PAP letter to [facility] CPhT, Etter Sjogren who will assist with contacting provider & patient to obtain necessary application requirements -I will follow up with patient in 1-2 weeks to ensure PAP applications have arrived   Regina Eck, PharmD, [place]  [phone]

## 2018-08-12 ENCOUNTER — Other Ambulatory Visit: Payer: Self-pay | Admitting: Pharmacy Technician

## 2018-08-12 NOTE — Patient Outreach (Signed)
[place] [facility]) Care Management  08/12/2018  Parker Martinez 07-31-1941 [id number]   Received [employer] (Spiriva Respimat), [employer] (Symbicort) and [employer] (Ventolin) patient assistance referral from [facility]. Prepared patient portion of applications to be mailed and faxed provider portions to Dr. Alva Garnet.  Will follow up with patient in 7-10 business days to confirm applications have been received.  Maud Deed [place], [place] [facility] [phone]

## 2018-08-13 ENCOUNTER — Other Ambulatory Visit: Payer: Self-pay | Admitting: *Deleted

## 2018-08-13 MED ORDER — ALBUTEROL SULFATE HFA 108 (90 BASE) MCG/ACT IN AERS: INHALATION_SPRAY | RESPIRATORY_TRACT | 6 refills | Status: DC

## 2018-08-29 ENCOUNTER — Ambulatory Visit (INDEPENDENT_AMBULATORY_CARE_PROVIDER_SITE_OTHER): Payer: PPO

## 2018-08-29 DIAGNOSIS — Z23 Encounter for immunization: Secondary | ICD-10-CM | POA: Diagnosis not present

## 2018-09-09 ENCOUNTER — Ambulatory Visit: Payer: Self-pay | Admitting: Pharmacy Technician

## 2018-09-09 ENCOUNTER — Ambulatory Visit (INDEPENDENT_AMBULATORY_CARE_PROVIDER_SITE_OTHER): Payer: PPO | Admitting: Family Medicine

## 2018-09-09 ENCOUNTER — Encounter: Payer: Self-pay | Admitting: Family Medicine

## 2018-09-09 ENCOUNTER — Other Ambulatory Visit: Payer: Self-pay | Admitting: Pharmacy Technician

## 2018-09-09 VITALS — BP 120/70 | HR 99 | Temp 97.8°F | Ht 68.0 in | Wt 169.5 lb

## 2018-09-09 DIAGNOSIS — R011 Cardiac murmur, unspecified: Secondary | ICD-10-CM

## 2018-09-09 DIAGNOSIS — Z87891 Personal history of nicotine dependence: Secondary | ICD-10-CM | POA: Diagnosis not present

## 2018-09-09 DIAGNOSIS — H8111 Benign paroxysmal vertigo, right ear: Secondary | ICD-10-CM | POA: Diagnosis not present

## 2018-09-09 NOTE — Progress Notes (Signed)
BP 120/70 (BP Location: Left Arm, Patient Position: Sitting, Cuff Size: Normal)   Pulse 99   Temp 97.8 F (36.6 C) (Oral)   Ht 5\' 8"  (1.727 m)   Wt 169 lb 8 oz (76.9 kg)   SpO2 94%   BMI 25.77 kg/m   Orthostatic VS for the past 24 hrs (Last 3 readings):  BP- Lying BP- Standing at 0 minutes  09/09/18 1457 - 128/62  09/09/18 1455 144/64 -    CC: dizziness Subjective:    Patient ID: Parker Martinez, male    DOB: [birth year], 77 y.o.   MRN: [id number]  HPI: Parker Martinez is a 77 y.o. male presenting on 09/09/2018 for Dizziness (C/o dizziness for about wk. Worse when lying down. )   Known severe COPD, vascular disease (RICA stenosis 40-59%, claudication RLE).   1.5 wk h/o dizziness, worse when he lays head on pillow. Dizziness described as lightheaded. No presyncope. Denies significant vertigo "room spinning". Mild headache at vertex of head.  Denies trouble with nausea, chest pain, palpitations, leg swelling, or hearing changes. No recent viral URI. No dyspnea or cough (other than chronic).   Last cigarette 06/04/2018!  Relevant past medical, surgical, family and social history reviewed and updated as indicated. Interim medical history since our last visit reviewed. Allergies and medications reviewed and updated. Outpatient Medications Prior to Visit  Medication Sig Dispense Refill  . albuterol (VENTOLIN HFA) 108 (90 Base) MCG/ACT inhaler USE 2 PUFFS EVERY 6 HOURS AS NEEDED FOR WHEEZING 18 Inhaler 6  . alfuzosin (UROXATRAL) 10 MG 24 hr tablet Take 10 mg by mouth daily with breakfast.    . aspirin 81 MG tablet Take 81 mg by mouth every morning.     . budesonide-formoterol (SYMBICORT) 160-4.5 MCG/ACT inhaler Inhale 2 puffs into the lungs 2 (two) times daily. 1 Inhaler 12  . cyclobenzaprine (FLEXERIL) 10 MG tablet Take 1 tablet (10 mg total) by mouth 2 (two) times daily as needed for muscle spasms. 30 tablet 0  . fexofenadine (ALLEGRA) 180 MG tablet Take 180 mg by mouth daily.      Marland Kitchen HYDROcodone-acetaminophen (NORCO/VICODIN) 5-325 MG tablet Take 1 tablet by mouth every 6 (six) hours as needed for moderate pain. 50 tablet 0  . montelukast (SINGULAIR) 10 MG tablet TAKE ONE (1) TABLET BY MOUTH EVERY NIGHTAT BEDTIME 30 tablet 11  . Multiple Vitamin (MULTIVITAMIN) tablet Take 1 tablet by mouth daily.    . pantoprazole (PROTONIX) 40 MG tablet TAKE ONE TABLET BY MOUTH DAILY BEFORE BREAKFAST 90 tablet 0  . pravastatin (PRAVACHOL) 40 MG tablet TAKE 1 TABLET BY MOUTH DAILY 30 tablet 11  . ranitidine (ZANTAC) 150 MG tablet Take 150 mg by mouth daily.    . Tiotropium Bromide Monohydrate (SPIRIVA RESPIMAT) 2.5 MCG/ACT AERS Inhale 2 puffs into the lungs daily. 4 g 10  . vitamin B-12 (CYANOCOBALAMIN) 500 MCG tablet Take 500 mcg by mouth every morning.      Facility-Administered Medications Prior to Visit  Medication Dose Route Frequency Provider Last Rate Last Dose  . 0.9 %  sodium chloride infusion  500 mL Intravenous Continuous Gatha Mayer, MD         Per HPI unless specifically indicated in ROS section below Review of Systems     Objective:    BP 120/70 (BP Location: Left Arm, Patient Position: Sitting, Cuff Size: Normal)   Pulse 99   Temp 97.8 F (36.6 C) (Oral)   Ht 5\' 8"  (1.727 m)  Wt 169 lb 8 oz (76.9 kg)   SpO2 94%   BMI 25.77 kg/m   Wt Readings from Last 3 Encounters:  09/09/18 169 lb 8 oz (76.9 kg)  07/22/18 167 lb (75.8 kg)  06/19/18 166 lb (75.3 kg)    Physical Exam  Constitutional: He appears well-developed and well-nourished. No distress.  HENT:  Mouth/Throat: Oropharynx is clear and moist. No oropharyngeal exudate.  Eyes: Pupils are equal, round, and reactive to light. Conjunctivae and EOM are normal.  Cardiovascular: Normal rate and regular rhythm.  Murmur (3/6 systolic at USB) heard. Pulmonary/Chest: Effort normal and breath sounds normal. No respiratory distress. He has no wheezes. He has no rales.  Musculoskeletal: He exhibits no edema.   Neurological: He is alert. No cranial nerve deficit or sensory deficit.  CN 2-12 intact FTN intact EOMI  ++ dix hallpike on right S/p epley repositioning on right and pt tolerated well  Nursing note and vitals reviewed.  Results for orders placed or performed in visit on 10/31/17  VAS US CAROTID  Result Value Ref Range   Right CCA prox sys -82 cm/s   Right CCA prox dias -17 cm/s   Right cca dist sys -126 cm/s   Left CCA prox sys 114 cm/s   Left CCA prox dias 21 cm/s   Left CCA dist sys 104 cm/s   Left CCA dist dias 18 cm/s   Left ICA prox sys -151 cm/s   Left ICA prox dias -18 cm/s   Left ICA dist sys -170 cm/s   Left ICA dist dias -36 cm/s   RIGHT ECA DIAS -18.00 cm/s   RIGHT VERTEBRAL DIAS -15.00 cm/s      Assessment & Plan:   Problem List Items Addressed This Visit    Heart murmur    Noted today - anticipate AS.       Ex-smoker    Congratulated on smoking cessation!      BPV (benign positional vertigo), right - Primary    Story/exam consistent with this.  Treated successfully with modified epley maneuver in office x2 Home exercises provided today.  Update if not improving with treatment.  Not consistent with central cause, although he has high risk factors for this.          No orders of the defined types were placed in this encounter.  No orders of the defined types were placed in this encounter.   Follow up plan: No follow-ups on file.  Ria Bush, MD

## 2018-09-09 NOTE — Assessment & Plan Note (Signed)
Noted today - anticipate AS.

## 2018-09-09 NOTE — Patient Outreach (Signed)
[facility]) Care Management  09/09/2018  EDENILSON AUSTAD [birth year] [id number]   Unsuccessful call attempt, HIPAA compliant voicemail left. Called patient to verify he had received patient assistance applications that were mailed to him.  Will make 2nd call attempt in 2-3 business days if call has not been returned.  Maud Deed [place], [place] Management [phone]

## 2018-09-09 NOTE — Patient Instructions (Signed)
I think you have right sided positional vertigo. Do exercises provided today Let us know if not improving with treatment for referral to vestibular rehab.   Benign Positional Vertigo Vertigo is the feeling that you or your surroundings are moving when they are not. Benign positional vertigo is the most common form of vertigo. The cause of this condition is not serious (is benign). This condition is triggered by certain movements and positions (is positional). This condition can be dangerous if it occurs while you are doing something that could endanger you or others, such as driving. What are the causes? In many cases, the cause of this condition is not known. It may be caused by a disturbance in an area of the inner ear that helps your brain to sense movement and balance. This disturbance can be caused by a viral infection (labyrinthitis), head injury, or repetitive motion. What increases the risk? This condition is more likely to develop in:  Women.  People who are 85 years of age or older.  What are the signs or symptoms? Symptoms of this condition usually happen when you move your head or your eyes in different directions. Symptoms may start suddenly, and they usually last for less than a minute. Symptoms may include:  Loss of balance and falling.  Feeling like you are spinning or moving.  Feeling like your surroundings are spinning or moving.  Nausea and vomiting.  Blurred vision.  Dizziness.  Involuntary eye movement (nystagmus).  Symptoms can be mild and cause only slight annoyance, or they can be severe and interfere with daily life. Episodes of benign positional vertigo may return (recur) over time, and they may be triggered by certain movements. Symptoms may improve over time. How is this diagnosed? This condition is usually diagnosed by medical history and a physical exam of the head, neck, and ears. You may be referred to a health care provider who specializes in ear,  nose, and throat (ENT) problems (otolaryngologist) or a provider who specializes in disorders of the nervous system (neurologist). You may have additional testing, including:  MRI.  A CT scan.  Eye movement tests. Your health care provider may ask you to change positions quickly while he or she watches you for symptoms of benign positional vertigo, such as nystagmus. Eye movement may be tested with an electronystagmogram (ENG), caloric stimulation, the Dix-Hallpike test, or the roll test.  An electroencephalogram (EEG). This records electrical activity in your brain.  Hearing tests.  How is this treated? Usually, your health care provider will treat this by moving your head in specific positions to adjust your inner ear back to normal. Surgery may be needed in severe cases, but this is rare. In some cases, benign positional vertigo may resolve on its own in 2-4 weeks. Follow these instructions at home: Safety  Move slowly.Avoid sudden body or head movements.  Avoid driving.  Avoid operating heavy machinery.  Avoid doing any tasks that would be dangerous to you or others if a vertigo episode would occur.  If you have trouble walking or keeping your balance, try using a cane for stability. If you feel dizzy or unstable, sit down right away.  Return to your normal activities as told by your health care provider. Ask your health care provider what activities are safe for you. General instructions  Take over-the-counter and prescription medicines only as told by your health care provider.  Avoid certain positions or movements as told by your health care provider.  Drink enough fluid  to keep your urine clear or pale yellow.  Keep all follow-up visits as told by your health care provider. This is important. Contact a health care provider if:  You have a fever.  Your condition gets worse or you develop new symptoms.  Your family or friends notice any behavioral changes.  Your  nausea or vomiting gets worse.  You have numbness or a "pins and needles" sensation. Get help right away if:  You have difficulty speaking or moving.  You are always dizzy.  You faint.  You develop severe headaches.  You have weakness in your legs or arms.  You have changes in your hearing or vision.  You develop a stiff neck.  You develop sensitivity to light. This information is not intended to replace advice given to you by your health care provider. Make sure you discuss any questions you have with your health care provider. Document Released: 08/28/2006 Document Revised: 04/27/2016 Document Reviewed: 03/15/2015 Elsevier Interactive Patient Education  [employer].

## 2018-09-09 NOTE — Assessment & Plan Note (Signed)
Congratulated on smoking cessation.  

## 2018-09-09 NOTE — Assessment & Plan Note (Signed)
Story/exam consistent with this.  Treated successfully with modified epley maneuver in office x2 Home exercises provided today.  Update if not improving with treatment.  Not consistent with central cause, although he has high risk factors for this.

## 2018-09-12 ENCOUNTER — Other Ambulatory Visit: Payer: Self-pay | Admitting: Pharmacy Technician

## 2018-09-12 ENCOUNTER — Ambulatory Visit: Payer: Self-pay | Admitting: Pharmacy Technician

## 2018-09-12 NOTE — Patient Outreach (Signed)
[facility]) Care Management  09/12/2018  Parker Martinez 11/28/1941 [id number]   Unsuccessful call #2 to patient to verify he has received patient assistance applications. HIPAA compliant voicemail left.  Will make 3rd call attempt in 3-5 business days if call has not been returned.  Maud Deed [place], [place] Management [phone]

## 2018-09-17 ENCOUNTER — Telehealth: Payer: Self-pay | Admitting: Family Medicine

## 2018-09-17 MED ORDER — RANITIDINE HCL 150 MG PO TABS: 150.0000 mg | ORAL_TABLET | Freq: Every day | ORAL | 1 refills | Status: DC

## 2018-09-17 NOTE — Telephone Encounter (Signed)
Pt is requesting a refill on Zantac. States he was taking it OTC but he needs RX due to all otc being pulled off shelves. He's requesting it to be called into [facility] [place]

## 2018-09-17 NOTE — Telephone Encounter (Signed)
E-scribed rx

## 2018-09-18 ENCOUNTER — Other Ambulatory Visit: Payer: Self-pay | Admitting: Pharmacy Technician

## 2018-09-18 NOTE — Patient Outreach (Signed)
[facility]) Care Management  09/18/2018  CHANCELOR HARDRICK 23-Jul-1941 [id number]   Received voicemail from patient while I was on PAL. Patient stated on voicemail that upon reviewing patient assistance applications, he does not think that he will meet the income requirements.  Will route note to [place] to inform.  Maud Deed [place], [place] Management [phone]

## 2018-09-19 ENCOUNTER — Ambulatory Visit: Payer: Self-pay | Admitting: Pharmacy Technician

## 2018-09-27 DIAGNOSIS — C61 Malignant neoplasm of prostate: Secondary | ICD-10-CM | POA: Diagnosis not present

## 2018-09-27 DIAGNOSIS — R3915 Urgency of urination: Secondary | ICD-10-CM | POA: Diagnosis not present

## 2018-09-30 ENCOUNTER — Telehealth: Payer: Self-pay | Admitting: Pulmonary Disease

## 2018-09-30 ENCOUNTER — Other Ambulatory Visit: Payer: Self-pay | Admitting: Pharmacist

## 2018-09-30 NOTE — Telephone Encounter (Signed)
Lottie Dawson a pharmacist from [facility] calling States that patient has been unable to afford inhalers, has been cutting dosage in half Would like to know if there are samples or assistance options available Almyra Free requested we call patient to discuss

## 2018-09-30 NOTE — Telephone Encounter (Signed)
Spoke with patient regarding inhalers and the cost issue. 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 Suggested he contact [employer] for patient assistance, to which he replied that he already had and they suggested to call us, which was "a waste of time." Nothing further at this time.

## 2018-09-30 NOTE — Patient Outreach (Signed)
[facility]) Care Management [place]  09/30/2018  Parker Martinez March 30, 1941 [id number]  Reason for referral: medication assistance  [facility] pharmacy case is being closed due to the following reasons:  -when patient combined income with spouse-->does not meet requirements -called [facility]) to see if samples were available (patient cutting dose in half to save)-->RN will be calling patient back later today -encouraged patient to also call MD when running low on inhalers and cannot afford  Patient has been provided [facility] CM contact information if assistance needed in the future.    Thank you for allowing [facility] pharmacy to be involved in this patient's care.     Regina Eck, PharmD, [place]  [phone]

## 2018-10-01 ENCOUNTER — Other Ambulatory Visit: Payer: Self-pay | Admitting: Internal Medicine

## 2018-10-13 ENCOUNTER — Other Ambulatory Visit: Payer: Self-pay | Admitting: Family Medicine

## 2018-10-13 DIAGNOSIS — D751 Secondary polycythemia: Secondary | ICD-10-CM

## 2018-10-13 DIAGNOSIS — E785 Hyperlipidemia, unspecified: Secondary | ICD-10-CM

## 2018-10-13 DIAGNOSIS — E559 Vitamin D deficiency, unspecified: Secondary | ICD-10-CM

## 2018-10-13 DIAGNOSIS — C61 Malignant neoplasm of prostate: Secondary | ICD-10-CM

## 2018-10-15 ENCOUNTER — Ambulatory Visit: Payer: PPO

## 2018-10-15 ENCOUNTER — Ambulatory Visit (INDEPENDENT_AMBULATORY_CARE_PROVIDER_SITE_OTHER): Payer: PPO

## 2018-10-15 VITALS — BP 122/78 | HR 108 | Temp 97.8°F | Ht 68.5 in | Wt 171.2 lb

## 2018-10-15 DIAGNOSIS — D751 Secondary polycythemia: Secondary | ICD-10-CM | POA: Diagnosis not present

## 2018-10-15 DIAGNOSIS — E559 Vitamin D deficiency, unspecified: Secondary | ICD-10-CM | POA: Diagnosis not present

## 2018-10-15 DIAGNOSIS — E785 Hyperlipidemia, unspecified: Secondary | ICD-10-CM | POA: Diagnosis not present

## 2018-10-15 DIAGNOSIS — C61 Malignant neoplasm of prostate: Secondary | ICD-10-CM

## 2018-10-15 DIAGNOSIS — Z0000 Encounter for general adult medical examination without abnormal findings: Secondary | ICD-10-CM

## 2018-10-15 LAB — COMPREHENSIVE METABOLIC PANEL
ALT: 17 U/L (ref 0–53)
AST: 19 U/L (ref 0–37)
Albumin: 4 g/dL (ref 3.5–5.2)
Alkaline Phosphatase: 75 U/L (ref 39–117)
BUN: 6 mg/dL (ref 6–23)
CO2: 30 mEq/L (ref 19–32)
Calcium: 10 mg/dL (ref 8.4–10.5)
Chloride: 103 mEq/L (ref 96–112)
Creatinine, Ser: 0.84 mg/dL (ref 0.40–1.50)
GFR: 93.84 mL/min (ref >60.00)
Glucose, Bld: 113 mg/dL — ABNORMAL HIGH (ref 70–99)
Potassium: 5 mEq/L (ref 3.5–5.1)
Sodium: 142 mEq/L (ref 135–145)
Total Bilirubin: 0.7 mg/dL (ref 0.2–1.2)
Total Protein: 6.6 g/dL (ref 6.0–8.3)

## 2018-10-15 LAB — LIPID PANEL
Cholesterol: 167 mg/dL (ref 0–200)
HDL: 87.3 mg/dL (ref >39.00)
LDL Cholesterol: 63 mg/dL (ref 0–99)
NonHDL: 79.33
Total CHOL/HDL Ratio: 2
Triglycerides: 84 mg/dL (ref 0.0–149.0)
VLDL: 16.8 mg/dL (ref 0.0–40.0)

## 2018-10-15 LAB — CBC WITH DIFFERENTIAL/PLATELET
Basophils Absolute: 0 10*3/uL (ref 0.0–0.1)
Basophils Relative: 0.5 % (ref 0.0–3.0)
Eosinophils Absolute: 0.3 10*3/uL (ref 0.0–0.7)
Eosinophils Relative: 3.7 % (ref 0.0–5.0)
HCT: 42.2 % (ref 39.0–52.0)
Hemoglobin: 14.3 g/dL (ref 13.0–17.0)
Lymphocytes Relative: 35.8 % (ref 12.0–46.0)
Lymphs Abs: 2.7 10*3/uL (ref 0.7–4.0)
MCHC: 33.9 g/dL (ref 30.0–36.0)
MCV: 96 fl (ref 78.0–100.0)
Monocytes Absolute: 0.8 10*3/uL (ref 0.1–1.0)
Monocytes Relative: 10.6 % (ref 3.0–12.0)
Neutro Abs: 3.7 10*3/uL (ref 1.4–7.7)
Neutrophils Relative %: 49.4 % (ref 43.0–77.0)
Platelets: 237 10*3/uL (ref 150.0–400.0)
RBC: 4.4 Mil/uL (ref 4.22–5.81)
RDW: 12.7 % (ref 11.5–15.5)
WBC: 7.5 10*3/uL (ref 4.0–10.5)

## 2018-10-15 LAB — PSA: PSA: 0.06 ng/mL — ABNORMAL LOW (ref 0.10–4.00)

## 2018-10-15 LAB — VITAMIN D 25 HYDROXY (VIT D DEFICIENCY, FRACTURES): VITD: 35.55 ng/mL (ref 30.00–100.00)

## 2018-10-15 NOTE — Progress Notes (Signed)
PCP notes:   Health maintenance:  No gaps identified.  Abnormal screenings:   Hearing - failed  Hearing Screening   125Hz  250Hz  500Hz  1000Hz  2000Hz  3000Hz  4000Hz  6000Hz  8000Hz   Right ear:   40 0 0  0    Left ear:   40 40 40  0     Patient concerns:   None  Nurse concerns:  None  Next PCP appt:   10/21/18 @ 1430

## 2018-10-15 NOTE — Patient Instructions (Addendum)
Parker Martinez , Thank you for taking time to come for your Medicare Wellness Visit. I appreciate your ongoing commitment to your health goals. Please review the following plan we discussed and let me know if I can assist you in the future.   These are the goals we discussed: Goals    . Increase physical activity     Starting 10/15/2018, I will continue to take medications as prescribed.        This is a list of the screening recommended for you and due dates:  Health Maintenance  Topic Date Due  . DTaP/Tdap/Td vaccine (1 - Tdap) 10/10/2019*  . Tetanus Vaccine  10/10/2019*  . Colon Cancer Screening  10/31/2019  . Flu Shot  Completed  . Pneumonia vaccines  Completed  *Topic was postponed. The date shown is not the original due date.   Preventive Care for Adults  A healthy lifestyle and preventive care can promote health and wellness. Preventive health guidelines for adults include the following key practices.  . A routine yearly physical is a good way to check with your health care provider about your health and preventive screening. It is a chance to share any concerns and updates on your health and to receive a thorough exam.  . Visit your dentist for a routine exam and preventive care every 6 months. Brush your teeth twice a day and floss once a day. Good oral hygiene prevents tooth decay and gum disease.  . The frequency of eye exams is based on your age, health, family medical history, use  of contact lenses, and other factors. Follow your health care provider's recommendations for frequency of eye exams.  . Eat a healthy diet. Foods like vegetables, fruits, whole grains, low-fat dairy products, and lean protein foods contain the nutrients you need without too many calories. Decrease your intake of foods high in solid fats, added sugars, and salt. Eat the right amount of calories for you. Get information about a proper diet from your health care provider, if necessary.  . Regular  physical exercise is one of the most important things you can do for your health. Most adults should get at least 150 minutes of moderate-intensity exercise (any activity that increases your heart rate and causes you to sweat) each week. In addition, most adults need muscle-strengthening exercises on 2 or more days a week.  Silver Sneakers may be a benefit available to you. To determine eligibility, you may visit the website: www.silversneakers.com or contact program at (918) 767-9294 Mon-Fri between 8AM-8PM.   . Maintain a healthy weight. The body mass index (BMI) is a screening tool to identify possible weight problems. It provides an estimate of body fat based on height and weight. Your health care provider can find your BMI and can help you achieve or maintain a healthy weight.   For adults 20 years and older: ? A BMI below 18.5 is considered underweight. ? A BMI of 18.5 to 24.9 is normal. ? A BMI of 25 to 29.9 is considered overweight. ? A BMI of 30 and above is considered obese.   . Maintain normal blood lipids and cholesterol levels by exercising and minimizing your intake of saturated fat. Eat a balanced diet with plenty of fruit and vegetables. Blood tests for lipids and cholesterol should begin at age 49 and be repeated every 5 years. If your lipid or cholesterol levels are high, you are over 50, or you are at high risk for heart disease, you may need your  cholesterol levels checked more frequently. Ongoing high lipid and cholesterol levels should be treated with medicines if diet and exercise are not working.  . If you smoke, find out from your health care provider how to quit. If you do not use tobacco, please do not start.  . If you choose to drink alcohol, please do not consume more than 2 drinks per day. One drink is considered to be 12 ounces (355 mL) of beer, 5 ounces (148 mL) of wine, or 1.5 ounces (44 mL) of liquor.  . If you are 41-64 years old, ask your health care provider if  you should take aspirin to prevent strokes.  . Use sunscreen. Apply sunscreen liberally and repeatedly throughout the day. You should seek shade when your shadow is shorter than you. Protect yourself by wearing long sleeves, pants, a wide-brimmed hat, and sunglasses year round, whenever you are outdoors.  . Once a month, do a whole body skin exam, using a mirror to look at the skin on your back. Tell your health care provider of new moles, moles that have irregular borders, moles that are larger than a pencil eraser, or moles that have changed in shape or color.

## 2018-10-15 NOTE — Progress Notes (Signed)
Subjective:   Parker Martinez is a 77 y.o. male who presents for Medicare Annual/Subsequent preventive examination.  Review of Systems:  N/A Cardiac Risk Factors include: advanced age (>16men, >50 women);hypertension;male gender     Objective:    Vitals: BP 122/78 (BP Location: Right Arm, Patient Position: Sitting, Cuff Size: Normal)   Pulse (!) 108   Temp 97.8 F (36.6 C) (Oral)   Ht 5' 8.5" (1.74 m) Comment: shoes  Wt 171 lb 4 oz (77.7 kg)   SpO2 94%   BMI 25.66 kg/m   Body mass index is 25.66 kg/m.  Advanced Directives 10/15/2018 08/01/2018 10/09/2017 06/14/2017 11/03/2015 09/23/2014  Does Patient Have a Medical Advance Directive? Yes Yes Yes Yes Yes Yes  Type of Paramedic of [place];Living will [place];Living will [place];Living will [place];Living will [place];Living will Living will;Healthcare Power of Attorney  Does patient want to make changes to medical advance directive? - - - - No - Patient declined No - Patient declined  Copy of [place] in Chart? Yes - validated most recent copy scanned in chart (See row information) - No - copy requested No - copy requested No - copy requested No - copy requested    Tobacco Social History   Tobacco Use  Smoking Status Former Smoker  . Packs/day: 0.50  . Years: 60.00  . Pack years: 30.00  . Types: Cigarettes  . Last attempt to quit: 06/05/2018  . Years since quitting: 0.3  Smokeless Tobacco Never Used     Counseling given: No   Clinical Intake:  Pre-visit preparation completed: Yes  Pain : No/denies pain Pain Score: 2      Nutritional Status: BMI 25 -29 Overweight Nutritional Risks: None Diabetes: No  How often do you need to have someone help you when you read instructions, pamphlets, or other written materials from your doctor or pharmacy?: 1 - Never What is the last grade level you  completed in school?: Master degree (2) + some post-graduate courses  Interpreter Needed?: No  Comments: pt lives with spouse Information entered by :: LPinson, LPN  Past Medical History:  Diagnosis Date  . Abnormal drug screen 10/2013, 03/2015   low Cr, +EtOH, neg hydrocodone (10/2013, again 03/2015)  . Allergic rhinitis   . Arthritis    lumbar and cervical spine  . Carotid stenosis 09/11/2014   40-59% bilat ICA stenosis, rpt 1 yr (09/2014)   . COPD (chronic obstructive pulmonary disease) (HCC)    severe, participated in [place] study  . DDD (degenerative disc disease)    lumbar (HNP R L3/4) s/p ESI, cervical  . Diverticulitis 2015   by CT  . Diverticulosis 2008   severe by colonoscopy and CT  . Duodenitis   . Esophageal stricture 2012   s/p dilation  . GERD (gastroesophageal reflux disease)   . Hemangioma    congenital left arm  . History of kidney stones remote  . HTN (hypertension)    borderline readings at home  . Idiopathic anaphylaxis    h/o recurrent anaphylaxis, unknown trigger, s/p eval at [place] [phone]), last episode 03/2011  . PAD (peripheral artery disease) ([place])   . Personal history of colonic adenomas 09/02/2013  . Prostate cancer [facility]) 2011   prostate seed implant (Dr. [name])  . Smoker   . Status post dilatation of esophageal stricture   . Subclavian steal syndrome 10/12/2016  Left subclavian steal, with 18 mmHg brachial artery pressure gradient (10/2016). If symptomatic, rec PV consult   Past Surgical History:  Procedure Laterality Date  . BALLOON DILATION N/A 10/02/2014   Procedure: BALLOON DILATION;  Surgeon: Gatha Mayer, MD;  Location: [facility];  Service: Endoscopy;  Laterality: N/A;  . COLONOSCOPY  08/2013   4 tubular adenomas, severe divertic, rpt 3 yrs [name])  . COLONOSCOPY  10/2016   severe diverticulosis, no f/u recommended [name])  . ESOPHAGOGASTRODUODENOSCOPY  10/2016   2 rings dilated [name])  .  ESOPHAGOGASTRODUODENOSCOPY (EGD) WITH PROPOFOL N/A 10/02/2014   WNL, mild esophagitis; Gatha Mayer, MD  . INSERTION PROSTATE RADIATION SEED  01/2015   [name]  . SHOULDER SURGERY  1961   due to chronic dislocation  . SPIROMETRY  10/2010   severe obstruction, FEV1 41%, ratio 0.46  . TONSILLECTOMY  1944 ?  . UPPER GASTROINTESTINAL ENDOSCOPY  06/08/2009   dilated esophageal stricture, duodenitis  . virtual colonoscopy  2007   [name], rpt rec 5 yrs   Family History  Problem Relation Age of Onset  . Lung cancer Father   . Hyperlipidemia Father   . Arthritis Mother   . Breast cancer Daughter   . Coronary artery disease Neg Hx   . Stroke Neg Hx   . Diabetes Neg Hx    Social History   Socioeconomic History  . Marital status: Married    Spouse name: Not on file  . Number of children: 1  . Years of education: Not on file  . Highest education level: Not on file  Occupational History  . Occupation: retired    Fish farm manager: RETIRED  Social Needs  . Financial resource strain: Not on file  . Food insecurity:    Worry: Not on file    Inability: Not on file  . Transportation needs:    Medical: Not on file    Non-medical: Not on file  Tobacco Use  . Smoking status: Former Smoker    Packs/day: 0.50    Years: 60.00    Pack years: 30.00    Types: Cigarettes    Last attempt to quit: 06/05/2018    Years since quitting: 0.3  . Smokeless tobacco: Never Used  Substance and Sexual Activity  . Alcohol use: Yes    Alcohol/week: 14.0 standard drinks    Types: 14 Cans of beer per week    Comment: socially  . Drug use: No  . Sexual activity: Not on file  Lifestyle  . Physical activity:    Days per week: Not on file    Minutes per session: Not on file  . Stress: Not on file  Relationships  . Social connections:    Talks on phone: Not on file    Gets together: Not on file    Attends religious service: Not on file    Active member of club or organization: Not on file    Attends meetings  of clubs or organizations: Not on file    Relationship status: Not on file  Other Topics Concern  . 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 Wife is Ladean Raya. "no extraordinary efforts" does not want  feeding tube.      Tested negative for hydrocodone and positive for alcohol on UDS 10/2013.  Recommend Q6 mo testing and will need to discuss EtOH use at next visit.    Outpatient Encounter Medications as of 10/15/2018  Medication Sig  . albuterol (VENTOLIN HFA) 108 (90 Base) MCG/ACT inhaler USE 2 PUFFS EVERY 6 HOURS AS NEEDED FOR WHEEZING  . alfuzosin (UROXATRAL) 10 MG 24 hr tablet Take 10 mg by mouth daily with breakfast.  . aspirin 81 MG tablet Take 81 mg by mouth every morning.   . budesonide-formoterol (SYMBICORT) 160-4.5 MCG/ACT inhaler Inhale 2 puffs into the lungs 2 (two) times daily.  . cyclobenzaprine (FLEXERIL) 10 MG tablet Take 1 tablet (10 mg total) by mouth 2 (two) times daily as needed for muscle spasms.  . fexofenadine (ALLEGRA) 180 MG tablet Take 180 mg by mouth daily.  Marland Kitchen HYDROcodone-acetaminophen (NORCO/VICODIN) 5-325 MG tablet Take 1 tablet by mouth every 6 (six) hours as needed for moderate pain.  . montelukast (SINGULAIR) 10 MG tablet TAKE ONE (1) TABLET BY MOUTH EVERY NIGHTAT BEDTIME  . Multiple Vitamin (MULTIVITAMIN) tablet Take 1 tablet by mouth daily.  . pantoprazole (PROTONIX) 40 MG tablet TAKE ONE TABLET BY MOUTH DAILY BEFORE BREAKFAST  . pravastatin (PRAVACHOL) 40 MG tablet TAKE 1 TABLET BY MOUTH DAILY  . ranitidine (ZANTAC) 150 MG tablet Take 1 tablet (150 mg total) by mouth daily.  . Tiotropium Bromide Monohydrate (SPIRIVA RESPIMAT) 2.5 MCG/ACT AERS  Inhale 2 puffs into the lungs daily.  . vitamin B-12 (CYANOCOBALAMIN) 500 MCG tablet Take 500 mcg by mouth every morning.    Facility-Administered Encounter Medications as of 10/15/2018  Medication  . 0.9 %  sodium chloride infusion    Activities of Daily Living In your present state of health, do you have any difficulty performing the following activities: 10/15/2018  Hearing? N  Vision? N  Difficulty concentrating or making decisions? N  Walking or climbing stairs? N  Dressing or bathing? N  Doing errands, shopping? N  Preparing Food and eating ? N  Using the Toilet? N  In the past six months, have you accidently leaked urine? Y  Do you have problems with loss of bowel control? N  Managing your Medications? N  Managing your Finances? N  Housekeeping or managing your Housekeeping? N  Some recent data might be hidden    Patient Care Team: Ria Bush, MD as PCP - General (Family Medicine) [name], MD as Consulting Physician (Ophthalmology) Angelia Mould, MD as Consulting Physician (Vascular Surgery) Gatha Mayer, MD as Consulting Physician (Gastroenterology) Myrlene Broker, MD as Attending Physician (Urology) Wilhelmina Mcardle, MD as Consulting Physician (Pulmonary Disease)   Assessment:   This is a routine wellness examination for Trashawn.   Hearing Screening   125Hz  250Hz  500Hz  1000Hz  2000Hz  3000Hz  4000Hz  6000Hz  8000Hz   Right ear:   40 0 0  0    Left ear:   40 40 40  0    Vision Screening Comments: Vision exam in Nov 2018 with Dr. Edison Pace   Exercise Activities and Dietary recommendations Current Exercise Habits: Home exercise routine, Type of exercise: calisthenics, Time (Minutes): 30, Frequency (Times/Week): 7, Weekly Exercise (Minutes/Week): 210, Intensity: Mild, Exercise limited by: respiratory conditions(s);orthopedic condition(s)  Goals    . Increase physical activity     Starting 10/15/2018, I will continue to take medications as  prescribed.        Fall Risk Fall Risk  10/15/2018  10/09/2017 09/20/2016 09/16/2015 09/11/2014  Falls in the past year? 0 No No No Yes  Number falls in past yr: - - - - 1  Comment - - - - Dog pulled him off the stool  Injury with Fall? - - - - Yes  Comment - - - - Tore muscles in right shoulder   Depression Screen PHQ 2/9 Scores 10/15/2018 08/01/2018 10/09/2017 05/21/2017  PHQ - 2 Score 0 0 0 0  PHQ- 9 Score 0 - 0 -    Cognitive Function MMSE - Mini Mental State Exam 10/15/2018 10/09/2017  Orientation to time 5 5  Orientation to Place 5 5  Registration 3 3  Attention/ Calculation 0 0  Recall 3 3  Language- name 2 objects 0 0  Language- repeat 1 1  Language- follow 3 step command 3 3  Language- read & follow direction 0 0  Write a sentence 0 0  Copy design 0 0  Total score 20 20     PLEASE NOTE: A Mini-Cog screen was completed. Maximum score is 20. A value of 0 denotes this part of [place] MMSE was not completed or the patient failed this part of the Mini-Cog screening.   Mini-Cog Screening Orientation to Time - Max 5 pts Orientation to Place - Max 5 pts Registration - Max 3 pts Recall - Max 3 pts Language Repeat - Max 1 pts Language Follow 3 Step Command - Max 3 pts     Immunization History  Administered Date(s) Administered  . Influenza,inj,Quad PF,6+ Mos 09/11/2014, 09/24/2015, 09/20/2016, 09/14/2017, 08/29/2018  . Pneumococcal Conjugate-13 09/11/2014  . Pneumococcal Polysaccharide-23 12/04/2009    Screening Tests Health Maintenance  Topic Date Due  . DTaP/Tdap/Td (1 - Tdap) 10/10/2019 (Originally 05/31/1960)  . TETANUS/TDAP  10/10/2019 (Originally 05/31/1960)  . COLONOSCOPY  10/31/2019  . INFLUENZA VACCINE  Completed  . PNA vac Low Risk Adult  Completed       Plan:     I have personally reviewed, addressed, and noted the following in the patient's chart:  A. Medical and social history B. Use of alcohol, tobacco or illicit drugs  C. Current medications  and supplements D. Functional ability and status E.  Nutritional status F.  Physical activity G. Advance directives H. List of other physicians I.  Hospitalizations, surgeries, and ER visits in previous 12 months J.  [place] to include hearing, vision, cognitive, depression L. Referrals and appointments - none  In addition, I have reviewed and discussed with patient certain preventive protocols, quality metrics, and best practice recommendations. A written personalized care plan for preventive services as well as general preventive health recommendations were provided to patient.  See attached scanned questionnaire for additional information.   Signed,   Lindell Noe, MHA, BS, LPN Health Coach

## 2018-10-20 NOTE — Progress Notes (Signed)
BP 140/70 (BP Location: Right Arm, Patient Position: Sitting, Cuff Size: Normal)   Pulse 98   Temp 97.8 F (36.6 C) (Oral)   Ht 5' 8.5" (1.74 m)   Wt 171 lb 8 oz (77.8 kg)   SpO2 94%   BMI 25.70 kg/m    CC: CPE Subjective:    Patient ID: Parker Martinez, male    DOB: Apr 21, 1941, 77 y.o.   MRN: [id number]  HPI: Parker Martinez is a 77 y.o. male presenting on 10/21/2018 for Annual Exam (Pt 2.) and Form Completion (Request [place] DMV Disablilty Parking placard. )   Saw Lesia last week for medicare wellness visit. Note reviewed. Failed L ear hearing screen. Requests ENT referral. Known tinnitus. Ongoing chronic post nasal drainage. No sinus congestion.   Known COPD, quit smoking earlier this year. On symbicort, allegra, spiriva.  Requests handicap placard for chronic dyspnea, back pain and burning leg pain. He does have a cane to use.   Noticing increasing esophageal dysphagia. Planning to f/u with GI [name]) to request rpt EGD. Requests refill of hydrocodone. Uses this for chronic lower back pain that at times limits ambulation.   Preventative: COLONOSCOPY 10/2016 - severe diverticulosis, no f/u recommended [name]) Prostate cancer - prostate seeds 2016 (Dr. [name] at [facility]). Normal recent CT scan.  Lung cancer screening -[facility]  Flu shot yearly Tetanus - unsure Pneumonia 2011, prevnar 2015 shingrix - discussed Advanced directives: has living will at home, scanned into chart (09/2014). Wife is Parker Martinez. "No extraordinary efforts" does not want feeding tube. Seat belt use discussed  Sunscreen use discussed. No changing moles on skin. Smoker 1/2 ppd, also vapes - last cigarette 06/04/2018!  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 Interim medical history since our last visit reviewed. Allergies and medications reviewed and updated. Outpatient Medications Prior to Visit  Medication Sig Dispense Refill  . alfuzosin (UROXATRAL) 10 MG 24 hr tablet Take 10 mg by mouth daily with breakfast.    . aspirin 81 MG tablet Take 81 mg by mouth every morning.     . budesonide-formoterol (SYMBICORT) 160-4.5 MCG/ACT inhaler Inhale 2 puffs into the lungs 2 (two) times daily. 1 Inhaler 12  . cyclobenzaprine (FLEXERIL) 10 MG tablet Take 1 tablet (10 mg total) by mouth 2 (two) times daily as needed for muscle spasms. 30 tablet 0  . fexofenadine (ALLEGRA) 180 MG tablet Take 180 mg by mouth daily.    . montelukast (SINGULAIR) 10 MG tablet TAKE ONE (1) TABLET BY MOUTH EVERY NIGHTAT BEDTIME 30 tablet 11  . Multiple Vitamin (MULTIVITAMIN) tablet Take 1 tablet by mouth daily.    . pantoprazole (PROTONIX) 40 MG tablet TAKE ONE TABLET BY MOUTH DAILY BEFORE BREAKFAST 90 tablet 0  . pravastatin (PRAVACHOL) 40 MG tablet TAKE 1 TABLET BY MOUTH DAILY 30 tablet 11  . Tiotropium Bromide Monohydrate (SPIRIVA RESPIMAT) 2.5 MCG/ACT AERS Inhale 2 puffs into the lungs daily. 4 g 10  . vitamin B-12 (CYANOCOBALAMIN) 500 MCG tablet Take 500 mcg by mouth every morning.     Marland Kitchen albuterol (VENTOLIN HFA)  108 (90 Base) MCG/ACT inhaler USE 2 PUFFS EVERY 6 HOURS AS NEEDED FOR WHEEZING 18 Inhaler 6  . HYDROcodone-acetaminophen (NORCO/VICODIN) 5-325 MG tablet Take 1 tablet by mouth every 6 (six) hours as needed for moderate pain. 50 tablet 0  . ranitidine (ZANTAC) 150 MG tablet Take 1 tablet (150 mg total) by mouth daily. 30 tablet 1  . 0.9 %  sodium chloride infusion      No facility-administered medications prior to visit.      Per HPI unless  specifically indicated in ROS section below Review of Systems  Constitutional: Negative for activity change, appetite change, chills, fatigue, fever and unexpected weight change.  HENT: Positive for postnasal drip (drainage). Negative for hearing loss.   Eyes: Negative for visual disturbance.  Respiratory: Positive for cough and shortness of breath. Negative for chest tightness and wheezing.   Cardiovascular: Negative for chest pain, palpitations and leg swelling.  Gastrointestinal: Negative for abdominal distention, abdominal pain, blood in stool, constipation, diarrhea, nausea and vomiting.  Endocrine: Positive for cold intolerance.  Genitourinary: Negative for difficulty urinating and hematuria.  Musculoskeletal: Negative for arthralgias, myalgias and neck pain.  Skin: Negative for rash.  Neurological: Positive for dizziness (occasional vertigo). Negative for seizures, syncope and headaches.  Hematological: Negative for adenopathy. Does not bruise/bleed easily.  Psychiatric/Behavioral: Negative for dysphoric mood. The patient is not nervous/anxious.        Objective:    BP 140/70 (BP Location: Right Arm, Patient Position: Sitting, Cuff Size: Normal)   Pulse 98   Temp 97.8 F (36.6 C) (Oral)   Ht 5' 8.5" (1.74 m)   Wt 171 lb 8 oz (77.8 kg)   SpO2 94%   BMI 25.70 kg/m   Wt Readings from Last 3 Encounters:  10/21/18 171 lb 8 oz (77.8 kg)  10/15/18 171 lb 4 oz (77.7 kg)  09/09/18 169 lb 8 oz (76.9 kg)    Physical Exam  Constitutional: He is oriented to person, place, and time. He appears well-developed and well-nourished. No distress.  HENT:  Head: Normocephalic and atraumatic.  Right Ear: Hearing, external ear and ear canal normal.  Left Ear: Hearing, tympanic membrane, external ear and ear canal normal.  Nose: Nose normal.  Mouth/Throat: Uvula is midline, oropharynx is clear and moist and mucous membranes are normal. No oropharyngeal exudate, posterior oropharyngeal edema or  posterior oropharyngeal erythema.  Wax covering R TM Cerumen removed from R canal with plastic curette.   Eyes: Pupils are equal, round, and reactive to light. Conjunctivae and EOM are normal. No scleral icterus.  Neck: Normal range of motion. Neck supple. Carotid bruit is present (bilateral, partly referred from cardiac murmur).  Cardiovascular: Normal rate, regular rhythm and intact distal pulses.  Murmur (3/6 systolic) heard. Pulses:      Radial pulses are 2+ on the right side, and 2+ on the left side.  Pulmonary/Chest: Effort normal and breath sounds normal. No respiratory distress. He has no wheezes. He has no rales.  Coarse breath sounds  Abdominal: Soft. Bowel sounds are normal. He exhibits no distension and no mass. There is no tenderness. There is no rebound and no guarding.  Musculoskeletal: Normal range of motion. He exhibits no edema.  Lymphadenopathy:    He has no cervical adenopathy.  Neurological: He is alert and oriented to person, place, and time.  CN grossly intact, station and gait intact  Skin: Skin is warm and dry. No rash noted.  Psychiatric: He has a normal mood and affect. His  behavior is normal. Judgment and thought content normal.  Nursing note and vitals reviewed.  Results for orders placed or performed in visit on 10/15/18  VITAMIN D 25 Hydroxy (Vit-D Deficiency, Fractures)  Result Value Ref Range   VITD 35.55 30.00 - 100.00 ng/mL  CBC with Differential/Platelet  Result Value Ref Range   WBC 7.5 4.0 - 10.5 K/uL   RBC 4.40 4.22 - 5.81 Mil/uL   Hemoglobin 14.3 13.0 - 17.0 g/dL   HCT 42.2 39.0 - 52.0 %   MCV 96.0 78.0 - 100.0 fl   MCHC 33.9 30.0 - 36.0 g/dL   RDW 12.7 11.5 - 15.5 %   Platelets 237.0 150.0 - 400.0 K/uL   Neutrophils Relative % 49.4 43.0 - 77.0 %   Lymphocytes Relative 35.8 12.0 - 46.0 %   Monocytes Relative 10.6 3.0 - 12.0 %   Eosinophils Relative 3.7 0.0 - 5.0 %   Basophils Relative 0.5 0.0 - 3.0 %   Neutro Abs 3.7 1.4 - 7.7 K/uL    Lymphs Abs 2.7 0.7 - 4.0 K/uL   Monocytes Absolute 0.8 0.1 - 1.0 K/uL   Eosinophils Absolute 0.3 0.0 - 0.7 K/uL   Basophils Absolute 0.0 0.0 - 0.1 K/uL  PSA  Result Value Ref Range   PSA 0.06 (L) 0.10 - 4.00 ng/mL  Comprehensive metabolic panel  Result Value Ref Range   Sodium 142 135 - 145 mEq/L   Potassium 5.0 3.5 - 5.1 mEq/L   Chloride 103 96 - 112 mEq/L   CO2 30 19 - 32 mEq/L   Glucose, Bld 113 (H) 70 - 99 mg/dL   BUN 6 6 - 23 mg/dL   Creatinine, Ser 0.84 0.40 - 1.50 mg/dL   Total Bilirubin 0.7 0.2 - 1.2 mg/dL   Alkaline Phosphatase 75 39 - 117 U/L   AST 19 0 - 37 U/L   ALT 17 0 - 53 U/L   Total Protein 6.6 6.0 - 8.3 g/dL   Albumin 4.0 3.5 - 5.2 g/dL   Calcium 10.0 8.4 - 10.5 mg/dL   GFR 93.84 >60.00 mL/min  Lipid panel  Result Value Ref Range   Cholesterol 167 0 - 200 mg/dL   Triglycerides 84.0 0.0 - 149.0 mg/dL   HDL 87.30 >39.00 mg/dL   VLDL 16.8 0.0 - 40.0 mg/dL   LDL Cholesterol 63 0 - 99 mg/dL   Total CHOL/HDL Ratio 2    NonHDL 79.33       Assessment & Plan:   Problem List Items Addressed This Visit    Subclavian steal syndrome    Update carotid US. Followed by [facility].       PVD (peripheral vascular disease) with claudication (HCC)    Chronic, on aspirin and statin.       Prostate cancer [facility])    Appreciate Dr [name] care. S/p seed implant      Polycythemia secondary to smoking    CBC normal now that he has quit smoking.       Idiopathic anaphylaxis    No recent flares. Carries epi pen.       HTN (hypertension)    Chronic, stable off medication.       Heart murmur    Presumed AS, sounds harsher than last year  - check echo to further evaluate this.       Relevant Orders   ECHOCARDIOGRAM COMPLETE   Healthcare maintenance - Primary    Preventative protocols reviewed and updated unless pt declined. Discussed healthy diet  and lifestyle.       GERD (gastroesophageal reflux disease)    Stable period on PPI and zantac daily.       Relevant  Medications   ranitidine (ZANTAC) 150 MG tablet   Ex-smoker    Quit smoking 06/2018! Remains abstinent.       Encounter for chronic pain management    [place] CSRS reviewed today.  Advised return in 3-4 mo for chronic pain visit. Will be due for rpt UDS and controlled substance agreement       Dyslipidemia    Chronic, stable on statin - continue The 10-year ASCVD risk score [name], et al., 2013) is: 28.7%   Values used to calculate the score:     Age: 44 years     Sex: Male     Is Non-Hispanic African American: No     Diabetic: No     Tobacco smoker: Yes     Systolic Blood Pressure: 527 mmHg     Is BP treated: No     HDL Cholesterol: 87.3 mg/dL     Total Cholesterol: 167 mg/dL       DDD (degenerative disc disease), lumbar   Relevant Medications   HYDROcodone-acetaminophen (NORCO/VICODIN) 5-325 MG tablet   COPD, severe (HCC)    Appreciate Dr Lora Havens care. Planned rpt PFTs early next year Discussed GoodRx - he will price out respiratory meds online.       Relevant Medications   albuterol (VENTOLIN HFA) 108 (90 Base) MCG/ACT inhaler   Carotid stenosis    Already has rpt carotid US scheduled 12/2018.       Bilateral hearing loss    Longstanding. Agrees to audiology evaluation.  Ear wax removal performed today      Relevant Orders   Ambulatory referral to Audiology       Meds ordered this encounter  Medications  . DISCONTD: HYDROcodone-acetaminophen (NORCO/VICODIN) 5-325 MG tablet    Sig: Take 1 tablet by mouth every 6 (six) hours as needed for moderate pain.    Dispense:  50 tablet    Refill:  0    For chronic pain  . ranitidine (ZANTAC) 150 MG tablet    Sig: Take 1 tablet (150 mg total) by mouth daily.    Dispense:  90 tablet    Refill:  3  . DISCONTD: albuterol (VENTOLIN HFA) 108 (90 Base) MCG/ACT inhaler    Sig: USE 2 PUFFS EVERY 6 HOURS AS NEEDED FOR WHEEZING    Dispense:  18 Inhaler    Refill:  6  . albuterol (VENTOLIN HFA) 108 (90 Base) MCG/ACT  inhaler    Sig: USE 2 PUFFS EVERY 6 HOURS AS NEEDED FOR WHEEZING    Dispense:  18 Inhaler    Refill:  6  . HYDROcodone-acetaminophen (NORCO/VICODIN) 5-325 MG tablet    Sig: Take 1 tablet by mouth every 6 (six) hours as needed for moderate pain.    Dispense:  50 tablet    Refill:  0    For chronic pain   Orders Placed This Encounter  Procedures  . Ambulatory referral to Audiology    Referral Priority:   Routine    Referral Type:   Audiology Exam    Referral Reason:   Specialty Services Required    Number of Visits Requested:   1  . ECHOCARDIOGRAM COMPLETE    Standing Status:   Future    Standing Expiration Date:   01/22/2020    Order Specific Question:  Where should this test be performed    Answer:   [facility]    Order Specific Question:   Please indicate who you request to read the echo results.    Answer:   [facility]    Order Specific Question:   Perflutren DEFINITY (image enhancing agent) should be administered unless hypersensitivity or allergy exist    Answer:   Administer Perflutren    Follow up plan: Return in about 4 months (around 02/19/2019) for follow up visit.  Ria Bush, MD

## 2018-10-20 NOTE — Assessment & Plan Note (Signed)
Preventative protocols reviewed and updated unless pt declined. Discussed healthy diet and lifestyle.  

## 2018-10-21 ENCOUNTER — Encounter: Payer: Self-pay | Admitting: Family Medicine

## 2018-10-21 ENCOUNTER — Ambulatory Visit (INDEPENDENT_AMBULATORY_CARE_PROVIDER_SITE_OTHER): Payer: PPO | Admitting: Family Medicine

## 2018-10-21 VITALS — BP 140/70 | HR 98 | Temp 97.8°F | Ht 68.5 in | Wt 171.5 lb

## 2018-10-21 DIAGNOSIS — D751 Secondary polycythemia: Secondary | ICD-10-CM | POA: Diagnosis not present

## 2018-10-21 DIAGNOSIS — G458 Other transient cerebral ischemic attacks and related syndromes: Secondary | ICD-10-CM | POA: Diagnosis not present

## 2018-10-21 DIAGNOSIS — Z87891 Personal history of nicotine dependence: Secondary | ICD-10-CM

## 2018-10-21 DIAGNOSIS — M5136 Other intervertebral disc degeneration, lumbar region: Secondary | ICD-10-CM

## 2018-10-21 DIAGNOSIS — I6523 Occlusion and stenosis of bilateral carotid arteries: Secondary | ICD-10-CM

## 2018-10-21 DIAGNOSIS — G8929 Other chronic pain: Secondary | ICD-10-CM | POA: Diagnosis not present

## 2018-10-21 DIAGNOSIS — Z0000 Encounter for general adult medical examination without abnormal findings: Secondary | ICD-10-CM

## 2018-10-21 DIAGNOSIS — C61 Malignant neoplasm of prostate: Secondary | ICD-10-CM

## 2018-10-21 DIAGNOSIS — K219 Gastro-esophageal reflux disease without esophagitis: Secondary | ICD-10-CM

## 2018-10-21 DIAGNOSIS — H9193 Unspecified hearing loss, bilateral: Secondary | ICD-10-CM | POA: Diagnosis not present

## 2018-10-21 DIAGNOSIS — I739 Peripheral vascular disease, unspecified: Secondary | ICD-10-CM

## 2018-10-21 DIAGNOSIS — J449 Chronic obstructive pulmonary disease, unspecified: Secondary | ICD-10-CM

## 2018-10-21 DIAGNOSIS — E785 Hyperlipidemia, unspecified: Secondary | ICD-10-CM

## 2018-10-21 DIAGNOSIS — I10 Essential (primary) hypertension: Secondary | ICD-10-CM

## 2018-10-21 DIAGNOSIS — T782XXS Anaphylactic shock, unspecified, sequela: Secondary | ICD-10-CM

## 2018-10-21 DIAGNOSIS — R011 Cardiac murmur, unspecified: Secondary | ICD-10-CM | POA: Diagnosis not present

## 2018-10-21 MED ORDER — HYDROCODONE-ACETAMINOPHEN 5-325 MG PO TABS: 1.0000 | ORAL_TABLET | Freq: Four times a day (QID) | ORAL | 0 refills | Status: DC | PRN

## 2018-10-21 MED ORDER — RANITIDINE HCL 150 MG PO TABS: 150.0000 mg | ORAL_TABLET | Freq: Every day | ORAL | 3 refills | Status: DC

## 2018-10-21 MED ORDER — ALBUTEROL SULFATE HFA 108 (90 BASE) MCG/ACT IN AERS: INHALATION_SPRAY | RESPIRATORY_TRACT | 6 refills | Status: DC

## 2018-10-21 NOTE — Assessment & Plan Note (Signed)
Stable period on PPI and zantac daily.

## 2018-10-21 NOTE — Patient Instructions (Addendum)
We will refer you to audiologist for hearing evaluation.  If interested, check with pharmacy about new 2 shot shingles series (shingrix).  Hydrocodone refilled. Return in 3-4 months for chronic pain visit.  We will await carotid artery ultrasound.  For heart murmur - we will refer you for heart ultrasound.   [place] [facility] and [place] [email or web address]    [place]: [facility] in [place] [phone], [facility] in [place] [phone] [place]: [place] [phone]  [facility] [phone])  [facility] [address] in [place] [phone]) Discount dental care office with plans to help patient cover expenses. In addition, they also have walk - in emergency hours, patient can wait to be seen.   [facility] [phone]) This clinic caters to the indigent population and is on a lottery system. Location: [employer] [facility], [employer], [place], [place] Clinic Hours: Wednesdays from 6pm - 9pm, patients seen by a lottery system. For dates, call or go to [email or web address] Services: Cleanings, fillings and simple extractions. Payment Options: DENT[facility] IS FREE OF CHARGE. Bring proof of income or support. Best way to get seen: Arrive at 5:15 pm - this is a lottery, NOT first come/first serve, so arriving earlier will not increase your chances of being seen.   Health Maintenance, Male A healthy lifestyle and preventive care is important for your health and wellness. Ask your health care provider about what schedule of regular examinations is right for you. What should I know about weight and diet? Eat a Healthy Diet  Eat plenty of vegetables, fruits, whole grains, low-fat dairy products, and lean  protein.  Do not eat a lot of foods high in solid fats, added sugars, or salt.  Maintain a Healthy Weight Regular exercise can help you achieve or maintain a healthy weight. You should:  Do at least 150 minutes of exercise each week. The exercise should increase your heart rate and make you sweat (moderate-intensity exercise).  Do strength-training exercises at least twice a week.  Watch Your Levels of Cholesterol and Blood Lipids  Have your blood tested for lipids and cholesterol every 5 years starting at 78 years of age. If you are at high risk for heart disease, you should start having your blood tested when you are 78 years old. You may need to have your cholesterol levels checked more often if: ? Your lipid or cholesterol levels are high. ? You are older than 78 years of age. ? You are at high risk for heart disease.  What should I know about cancer screening? Many types of cancers can be detected early and may often be prevented. Lung Cancer  You should be screened every year for lung cancer if: ? You are a current smoker who has smoked for at least 30 years. ? You are a former smoker who has quit within the past 15 years.  Talk to your health care provider about your screening options, when you should start screening, and how often you should be screened.  Colorectal Cancer  Routine colorectal cancer screening usually begins at 78 years of age and should be repeated every 5-10 years until you are 78 years old. You may need to be screened more often if early forms of precancerous polyps or small growths are found. Your health care provider may recommend screening at an earlier age if you have  risk factors for colon cancer.  Your health care provider may recommend using home test kits to check for hidden blood in the stool.  A small camera at the end of a tube can be used to examine your colon (sigmoidoscopy or colonoscopy). This checks for the earliest forms of colorectal  cancer.  Prostate and Testicular Cancer  Depending on your age and overall health, your health care provider may do certain tests to screen for prostate and testicular cancer.  Talk to your health care provider about any symptoms or concerns you have about testicular or prostate cancer.  Skin Cancer  Check your skin from head to toe regularly.  Tell your health care provider about any new moles or changes in moles, especially if: ? There is a change in a mole's size, shape, or color. ? You have a mole that is larger than a pencil eraser.  Always use sunscreen. Apply sunscreen liberally and repeat throughout the day.  Protect yourself by wearing long sleeves, pants, a wide-brimmed hat, and sunglasses when outside.  What should I know about heart disease, diabetes, and high blood pressure?  If you are 47-74 years of age, have your blood pressure checked every 3-5 years. If you are 88 years of age or older, have your blood pressure checked every year. You should have your blood pressure measured twice-once when you are at a hospital or clinic, and once when you are not at a hospital or clinic. Record the average of the two measurements. To check your blood pressure when you are not at a hospital or clinic, you can use: ? An automated blood pressure machine at a pharmacy. ? A home blood pressure monitor.  Talk to your health care provider about your target blood pressure.  If you are between 26-72 years old, ask your health care provider if you should take aspirin to prevent heart disease.  Have regular diabetes screenings by checking your fasting blood sugar level. ? If you are at a normal weight and have a low risk for diabetes, have this test once every three years after the age of 56. ? If you are overweight and have a high risk for diabetes, consider being tested at a younger age or more often.  A one-time screening for abdominal aortic aneurysm (AAA) by ultrasound is recommended  for men aged 59-75 years who are current or former smokers. What should I know about preventing infection? Hepatitis B If you have a higher risk for hepatitis B, you should be screened for this virus. Talk with your health care provider to find out if you are at risk for hepatitis B infection. Hepatitis C Blood testing is recommended for:  Everyone born from [birth year] through 1966.  Anyone with known risk factors for hepatitis C.  Sexually Transmitted Diseases (STDs)  You should be screened each year for STDs including gonorrhea and chlamydia if: ? You are sexually active and are younger than 78 years of age. ? You are older than 78 years of age and your health care provider tells you that you are at risk for this type of infection. ? Your sexual activity has changed since you were last screened and you are at an increased risk for chlamydia or gonorrhea. Ask your health care provider if you are at risk.  Talk with your health care provider about whether you are at high risk of being infected with HIV. Your health care provider may recommend a prescription medicine to help prevent HIV infection.  What else can I do?  Schedule regular health, dental, and eye exams.  Stay current with your vaccines (immunizations).  Do not use any tobacco products, such as cigarettes, chewing tobacco, and e-cigarettes. If you need help quitting, ask your health care provider.  Limit alcohol intake to no more than 2 drinks per day. One drink equals 12 ounces of beer, 5 ounces of wine, or 1 ounces of hard liquor.  Do not use street drugs.  Do not share needles.  Ask your health care provider for help if you need support or information about quitting drugs.  Tell your health care provider if you often feel depressed.  Tell your health care provider if you have ever been abused or do not feel safe at home. This information is not intended to replace advice given to you by your health care provider. Make  sure you discuss any questions you have with your health care provider. Document Released: 05/18/2008 Document Revised: 07/19/2016 Document Reviewed: 08/24/2015 Elsevier Interactive Patient Education  [employer].

## 2018-10-21 NOTE — Assessment & Plan Note (Signed)
No recent flares. Carries epi pen.

## 2018-10-21 NOTE — Assessment & Plan Note (Signed)
Chronic, on aspirin and statin.

## 2018-10-21 NOTE — Assessment & Plan Note (Signed)
Update carotid US. Followed by [facility].

## 2018-10-21 NOTE — Assessment & Plan Note (Signed)
CBC normal now that he has quit smoking.

## 2018-10-21 NOTE — Assessment & Plan Note (Signed)
Presumed AS, sounds harsher than last year  - check echo to further evaluate this.

## 2018-10-21 NOTE — Assessment & Plan Note (Signed)
Appreciate Dr [name] care. S/p seed implant

## 2018-10-21 NOTE — Assessment & Plan Note (Signed)
Quit smoking 06/2018! Remains abstinent.

## 2018-10-21 NOTE — Assessment & Plan Note (Signed)
[place] CSRS reviewed today.  Advised return in 3-4 mo for chronic pain visit. Will be due for rpt UDS and controlled substance agreement

## 2018-10-21 NOTE — Assessment & Plan Note (Addendum)
Longstanding. Agrees to audiology evaluation.  Ear wax removal performed today

## 2018-10-21 NOTE — Assessment & Plan Note (Signed)
Chronic, stable on statin - continue The 10-year ASCVD risk score [name], et al., 2013) is: 28.7%   Values used to calculate the score:     Age: 77 years     Sex: Male     Is Non-Hispanic African American: No     Diabetic: No     Tobacco smoker: Yes     Systolic Blood Pressure: 389 mmHg     Is BP treated: No     HDL Cholesterol: 87.3 mg/dL     Total Cholesterol: 167 mg/dL

## 2018-10-21 NOTE — Assessment & Plan Note (Signed)
Already has rpt carotid US scheduled 12/2018.

## 2018-10-21 NOTE — Assessment & Plan Note (Addendum)
Appreciate Dr Lora Havens care. Planned rpt PFTs early next year Discussed GoodRx - he will price out respiratory meds online.

## 2018-10-21 NOTE — Assessment & Plan Note (Signed)
Chronic, stable off medication.

## 2018-10-25 DIAGNOSIS — H9313 Tinnitus, bilateral: Secondary | ICD-10-CM | POA: Diagnosis not present

## 2018-10-25 DIAGNOSIS — H903 Sensorineural hearing loss, bilateral: Secondary | ICD-10-CM | POA: Diagnosis not present

## 2018-10-25 DIAGNOSIS — R0982 Postnasal drip: Secondary | ICD-10-CM | POA: Diagnosis not present

## 2018-10-26 ENCOUNTER — Other Ambulatory Visit: Payer: Self-pay | Admitting: Family Medicine

## 2018-11-18 ENCOUNTER — Other Ambulatory Visit: Payer: Self-pay | Admitting: Family Medicine

## 2018-11-19 ENCOUNTER — Other Ambulatory Visit: Payer: Self-pay

## 2018-11-19 ENCOUNTER — Ambulatory Visit (INDEPENDENT_AMBULATORY_CARE_PROVIDER_SITE_OTHER): Payer: PPO

## 2018-11-19 DIAGNOSIS — I6523 Occlusion and stenosis of bilateral carotid arteries: Secondary | ICD-10-CM

## 2018-11-19 DIAGNOSIS — G458 Other transient cerebral ischemic attacks and related syndromes: Secondary | ICD-10-CM | POA: Diagnosis not present

## 2018-11-19 DIAGNOSIS — R011 Cardiac murmur, unspecified: Secondary | ICD-10-CM

## 2018-11-26 ENCOUNTER — Other Ambulatory Visit: Payer: Self-pay | Admitting: Family Medicine

## 2018-11-26 MED ORDER — ATORVASTATIN CALCIUM 40 MG PO TABS: 40.0000 mg | ORAL_TABLET | Freq: Every day | ORAL | 3 refills | Status: DC

## 2018-12-01 NOTE — Progress Notes (Signed)
I reviewed health advisor's note, was available for consultation, and agree with documentation and plan.  

## 2018-12-18 ENCOUNTER — Encounter: Payer: Self-pay | Admitting: Internal Medicine

## 2018-12-18 ENCOUNTER — Ambulatory Visit (INDEPENDENT_AMBULATORY_CARE_PROVIDER_SITE_OTHER): Payer: PPO | Admitting: Internal Medicine

## 2018-12-18 VITALS — BP 112/60 | HR 76 | Ht 68.0 in | Wt 173.4 lb

## 2018-12-18 DIAGNOSIS — H2513 Age-related nuclear cataract, bilateral: Secondary | ICD-10-CM | POA: Diagnosis not present

## 2018-12-18 DIAGNOSIS — R1310 Dysphagia, unspecified: Secondary | ICD-10-CM

## 2018-12-18 DIAGNOSIS — R0982 Postnasal drip: Secondary | ICD-10-CM

## 2018-12-18 DIAGNOSIS — K222 Esophageal obstruction: Secondary | ICD-10-CM

## 2018-12-18 DIAGNOSIS — R1319 Other dysphagia: Secondary | ICD-10-CM

## 2018-12-18 DIAGNOSIS — K219 Gastro-esophageal reflux disease without esophagitis: Secondary | ICD-10-CM

## 2018-12-18 NOTE — Patient Instructions (Signed)
  You have been scheduled for an endoscopy. Please follow written instructions given to you at your visit today. If you use inhalers (even only as needed), please bring them with you on the day of your procedure.   I appreciate the opportunity to care for you. Carl [name], MD, FACG 

## 2018-12-18 NOTE — Progress Notes (Signed)
Parker Martinez 78 y.o. [birth year] [id number]  Assessment & Plan:   Encounter Diagnoses  Name Primary?  . Esophageal dysphagia Yes  . Gastroesophageal reflux disease, esophagitis presence not specified   . Esophageal ring, acquired   . Postnasal drip     Schedule for repeat EGD with esophageal dilation.  Consider increasing to twice daily PPI given recurrent dysphagia despite daily high-dose PPI and also because of his sinus drainage issues.  His otolaryngologist is correct sometimes postnasal drip is related to reflux and can respond to twice daily PPI.  I do not think his dental issues are related to his dysphagia changes as he has been in this condition as far as that is concerned for some time.  The risks and benefits as well as alternatives of endoscopic procedure(s) have been discussed and reviewed. All questions answered. The patient agrees to proceed.  I appreciate the opportunity to care for this patient. CC: Ria Bush, MD   Subjective:   Chief Complaint: Dysphagia  HPI Parker Martinez is here with complaints of a couple of months of recurrent solid food dysphagia.  He has had this problem before and is responded to dilation of esophageal rings, last in 2017.  He is on pantoprazole 40 mg daily and daily H2 blocker also.  Wt Readings from Last 3 Encounters:  12/18/18 173 lb 6 oz (78.6 kg)  10/21/18 171 lb 8 oz (77.8 kg)  10/15/18 171 lb 4 oz (77.7 kg)   He quit smoking 6 months ago.  He does have dental issues and is not equipped with many upper teeth he is waiting to have residual teeth removed and get fitted for dentures there, however this is a stable chronic situation and does not represent a change for him. Allergies  Allergen Reactions  . Flonase [Fluticasone Propionate] Other (See Comments)    Per pt, causes rebound effect    Current Meds  Medication Sig  . albuterol (VENTOLIN HFA) 108 (90 Base) MCG/ACT inhaler USE 2 PUFFS EVERY 6 HOURS AS NEEDED FOR  WHEEZING  . alfuzosin (UROXATRAL) 10 MG 24 hr tablet Take 10 mg by mouth daily with breakfast.  . aspirin 81 MG tablet Take 81 mg by mouth every morning.   Marland Kitchen atorvastatin (LIPITOR) 40 MG tablet Take 1 tablet (40 mg total) by mouth daily.  . cyclobenzaprine (FLEXERIL) 10 MG tablet Take 1 tablet (10 mg total) by mouth 2 (two) times daily as needed for muscle spasms.  . fexofenadine (ALLEGRA) 180 MG tablet Take 180 mg by mouth daily.  Marland Kitchen HYDROcodone-acetaminophen (NORCO/VICODIN) 5-325 MG tablet Take 1 tablet by mouth every 6 (six) hours as needed for moderate pain.  . montelukast (SINGULAIR) 10 MG tablet TAKE ONE TABLET BY MOUTH EVERY NIGHT AT BEDTIME  . Multiple Vitamin (MULTIVITAMIN) tablet Take 1 tablet by mouth daily.  . pantoprazole (PROTONIX) 40 MG tablet TAKE ONE TABLET BY MOUTH DAILY BEFORE BREAKFAST  . ranitidine (ZANTAC) 150 MG tablet Take 1 tablet (150 mg total) by mouth daily.  . SYMBICORT 160-4.5 MCG/ACT inhaler INHALE 2 PUFFS EVERY 12 HOURS TO PREVENT COUGH OR WHEEZE - RINSE/GARGLE/SPIT AFTER EACH USE  . Tiotropium Bromide Monohydrate (SPIRIVA RESPIMAT) 2.5 MCG/ACT AERS Inhale 2 puffs into the lungs daily.  . vitamin B-12 (CYANOCOBALAMIN) 500 MCG tablet Take 500 mcg by mouth every morning.    Past Medical History:  Diagnosis Date  . Abnormal drug screen 10/2013, 03/2015   low Cr, +EtOH, neg hydrocodone (10/2013, again 03/2015)  . Allergic rhinitis   .  Arthritis    lumbar and cervical spine  . Carotid stenosis 09/11/2014   40-59% bilat ICA stenosis, rpt 1 yr (09/2014)   . COPD (chronic obstructive pulmonary disease) (HCC)    severe, participated in [place] study  . DDD (degenerative disc disease)    lumbar (HNP R L3/4) s/p ESI, cervical  . Diverticulitis 2015   by CT  . Diverticulosis 2008   severe by colonoscopy and CT  . Duodenitis   . Esophageal stricture 2012   s/p dilation  . GERD (gastroesophageal reflux disease)   . Hemangioma    congenital left arm  . History of kidney  stones remote  . HTN (hypertension)    borderline readings at home  . Idiopathic anaphylaxis    h/o recurrent anaphylaxis, unknown trigger, s/p eval at [facility] [id number]), last episode 03/2011  . PAD (peripheral artery disease) ([place])   . Personal history of colonic adenomas 09/02/2013  . Prostate cancer [facility]) 2011   prostate seed implant (Dr. [name])  . Smoker   . Status post dilatation of esophageal stricture   . Subclavian steal syndrome 10/12/2016   Left subclavian steal, with 18 mmHg brachial artery pressure gradient (10/2016). If symptomatic, rec PV consult   Past Surgical History:  Procedure Laterality Date  . BALLOON DILATION N/A 10/02/2014   Procedure: BALLOON DILATION;  Surgeon: Gatha Mayer, MD;  Location: [facility];  Service: Endoscopy;  Laterality: N/A;  . COLONOSCOPY  08/2013   4 tubular adenomas, severe divertic, rpt 3 yrs [name])  . COLONOSCOPY  10/2016   severe diverticulosis, no f/u recommended [name])  . ESOPHAGOGASTRODUODENOSCOPY  10/2016   2 rings dilated [name])  . ESOPHAGOGASTRODUODENOSCOPY (EGD) WITH PROPOFOL N/A 10/02/2014   WNL, mild esophagitis; Gatha Mayer, MD  . INSERTION PROSTATE RADIATION SEED  01/2015   [name]  . SHOULDER SURGERY  1961   due to chronic dislocation  . SPIROMETRY  10/2010   severe obstruction, FEV1 41%, ratio 0.46  . TONSILLECTOMY  1944 ?  . UPPER GASTROINTESTINAL ENDOSCOPY  06/08/2009   dilated esophageal stricture, duodenitis  . 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 Wife is Ladean Raya. "no extraordinary efforts" does not want feeding  tube.      Tested negative for hydrocodone and positive for alcohol on UDS 10/2013.  Recommend Q6 mo testing and will need to discuss EtOH use at next visit.   family history includes Arthritis in his mother; Breast cancer in his daughter; Hyperlipidemia in his father; Lung cancer in his father.   Review of Systems As per HPI  Objective:   Physical Exam @BP  112/60   Pulse 76   Ht 5\' 8"  (1.727 m)   Wt 173 lb 6 oz (78.6 kg)   BMI 26.36 kg/m @  General:  NAD Eyes:   Anicteric ENT:  Mouth shows missing upper teeth he is waiting to get some residual teeth removed and fitted for dentures Lungs:  clear with decreased breath sounds throughout Heart::  S1S2 no rubs, murmurs or gallops, distant sounds     Data Reviewed:  Prior EGD 2017 2015 also colonoscopy 2017 no polyps ENT note 10/25/2018, Dr. Redmond Baseman

## 2018-12-26 ENCOUNTER — Encounter: Payer: Self-pay | Admitting: Internal Medicine

## 2019-01-04 HISTORY — PX: ESOPHAGOGASTRODUODENOSCOPY: SHX1529

## 2019-01-06 ENCOUNTER — Other Ambulatory Visit: Payer: Self-pay | Admitting: Internal Medicine

## 2019-01-09 ENCOUNTER — Ambulatory Visit (AMBULATORY_SURGERY_CENTER): Payer: PPO | Admitting: Internal Medicine

## 2019-01-09 ENCOUNTER — Encounter: Payer: Self-pay | Admitting: Internal Medicine

## 2019-01-09 VITALS — BP 124/67 | HR 87 | Temp 97.5°F | Resp 15

## 2019-01-09 DIAGNOSIS — R1310 Dysphagia, unspecified: Secondary | ICD-10-CM

## 2019-01-09 DIAGNOSIS — K222 Esophageal obstruction: Secondary | ICD-10-CM

## 2019-01-09 DIAGNOSIS — J449 Chronic obstructive pulmonary disease, unspecified: Secondary | ICD-10-CM | POA: Diagnosis not present

## 2019-01-09 MED ORDER — PANTOPRAZOLE SODIUM 40 MG PO TBEC: 40.0000 mg | DELAYED_RELEASE_TABLET | Freq: Two times a day (BID) | ORAL | 3 refills | Status: DC

## 2019-01-09 MED ORDER — SODIUM CHLORIDE 0.9 % IV SOLN: 500.0000 mL | Freq: Once | INTRAVENOUS | Status: DC

## 2019-01-09 NOTE — Patient Instructions (Addendum)
I dilated the esophagus again today.  It should improve your swallowing.  I am changing the pantoprazole to be taken twice a day in hopes that will eliminate the need for another dilation.  I appreciate the opportunity to care for you. Gatha Mayer, MD, [facility]  Handout given for Post-Dilation diet.  YOU HAD AN ENDOSCOPIC PROCEDURE TODAY AT [facility]:   Refer to the procedure report that was given to you for any specific questions about what was found during the examination.  If the procedure report does not answer your questions, please call your gastroenterologist to clarify.  If you requested that your care partner not be given the details of your procedure findings, then the procedure report has been included in a sealed envelope for you to review at your convenience later.  YOU SHOULD EXPECT: Some feelings of bloating in the abdomen. Passage of more gas than usual.  Walking can help get rid of the air that was put into your GI tract during the procedure and reduce the bloating. If you had a lower endoscopy (such as a colonoscopy or flexible sigmoidoscopy) you may notice spotting of blood in your stool or on the toilet paper. If you underwent a bowel prep for your procedure, you may not have a normal bowel movement for a few days.  Please Note:  You might notice some irritation and congestion in your nose or some drainage.  This is from the oxygen used during your procedure.  There is no need for concern and it should clear up in a day or so.  SYMPTOMS TO REPORT IMMEDIATELY:   Following upper endoscopy (EGD)  Vomiting of blood or coffee ground material  New chest pain or pain under the shoulder blades  Painful or persistently difficult swallowing  New shortness of breath  Fever of 100F or higher  Black, tarry-looking stools  For urgent or emergent issues, a gastroenterologist can be reached at any hour by calling [phone].   DIET:  We do recommend  a small meal at first, but then you may proceed to your regular diet.  Drink plenty of fluids but you should avoid alcoholic beverages for 24 hours.  ACTIVITY:  You should plan to take it easy for the rest of today and you should NOT DRIVE or use heavy machinery until tomorrow (because of the sedation medicines used during the test).    FOLLOW UP: Our staff will call the number listed on your records the next business day following your procedure to check on you and address any questions or concerns that you may have regarding the information given to you following your procedure. If we do not reach you, we will leave a message.  However, if you are feeling well and you are not experiencing any problems, there is no need to return our call.  We will assume that you have returned to your regular daily activities without incident.  If any biopsies were taken you will be contacted by phone or by letter within the next 1-3 weeks.  Please call us at [phone] if you have not heard about the biopsies in 3 weeks.    SIGNATURES/CONFIDENTIALITY: You and/or your care partner have signed paperwork which will be entered into your electronic medical record.  These signatures attest to the fact that that the information above on your After Visit Summary has been reviewed and is understood.  Full responsibility of the confidentiality of this discharge information lies with  you and/or your care-partner.

## 2019-01-09 NOTE — Progress Notes (Signed)
Called to room to assist during endoscopic procedure.  Patient ID and intended procedure confirmed with present staff. Received instructions for my participation in the procedure from the performing physician.  

## 2019-01-09 NOTE — Op Note (Signed)
[place] Patient Name: Parker Martinez Procedure Date: 01/09/2019 1:31 PM MRN: [id number] Endoscopist: Gatha Mayer , MD Age: 78 Referring MD:  Date of Birth: [birth year] Gender: Male Account #: [id number] Procedure:                Upper GI endoscopy Indications:              Dysphagia Medicines:                Propofol per Anesthesia, Monitored Anesthesia Care Procedure:                Pre-Anesthesia Assessment:                           - Prior to the procedure, a History and Physical                            was performed, and patient medications and                            allergies were reviewed. The patient's tolerance of                            previous anesthesia was also reviewed. The risks                            and benefits of the procedure and the sedation                            options and risks were discussed with the patient.                            All questions were answered, and informed consent                            was obtained. Prior Anticoagulants: The patient has                            taken no previous anticoagulant or antiplatelet                            agents. ASA Grade Assessment: III - A patient with                            severe systemic disease. After reviewing the risks                            and benefits, the patient was deemed in                            satisfactory condition to undergo the procedure.                           After obtaining informed consent, the endoscope was  passed under direct vision. Throughout the                            procedure, the patient's blood pressure, pulse, and                            oxygen saturations were monitored continuously. The                            Endoscope was introduced through the mouth, and                            advanced to the prepyloric region, stomach. The                            upper GI endoscopy was  accomplished without                            difficulty. The patient tolerated the procedure                            well. Scope In: Scope Out: Findings:                 Multiple webs or rings were found in the lower                            third of the esophagus. The scope was withdrawn.                            Dilation was performed with a Maloney dilator with                            no resistance at 52 Fr. The dilation site was                            examined following endoscope reinsertion and showed                            moderate mucosal disruption. Estimated blood loss                            was minimal.                           Patchy mildly erythematous mucosa without bleeding                            was found in the entire examined stomach.                           The cardia and gastric fundus were normal on                            retroflexion. Complications:  No immediate complications. Estimated Blood Loss:     Estimated blood loss was minimal. Impression:               - Webs/rings in the lower third of the esophagus.                            Dilated. 81 Fr w/ good result                           - Erythematous mucosa in the stomach. Duodenum not                            examined                           - No specimens collected. Recommendation:           - Patient has a contact number available for                            emergencies. The signs and symptoms of potential                            delayed complications were discussed with the                            patient. Return to normal activities tomorrow.                            Written discharge instructions were provided to the                            patient.                           - Clear liquids x 1 hour then soft foods rest of                            day. Start prior diet tomorrow.                           - Continue present medications.                            - Change pantoprazole to 40 mg bid (from qd) to                            reduce chances of needing repeat dilation Gatha Mayer, MD 01/09/2019 1:54:57 PM This report has been signed electronically.

## 2019-01-09 NOTE — Progress Notes (Signed)
Pt's states no medical or surgical changes since previsit or office visit. 

## 2019-01-09 NOTE — Progress Notes (Signed)
Report given to PACU, vss 

## 2019-01-10 ENCOUNTER — Telehealth: Payer: Self-pay

## 2019-01-10 ENCOUNTER — Telehealth: Payer: Self-pay | Admitting: *Deleted

## 2019-01-10 NOTE — Telephone Encounter (Signed)
First follow up call attempt.  Reached voicemail with number identified.  Message left to call if any questions or concerns and we would attempt to call back later today.

## 2019-01-10 NOTE — Telephone Encounter (Signed)
  Follow up Call-  Call back number 01/09/2019 10/30/2016  Post procedure Call Back phone  # [phone]  Permission to leave phone message Yes Yes  Some recent data might be hidden     Patient questions:  Do you have a fever, pain , or abdominal swelling? No Pain Score  0  Have you tolerated food without any problems? Yes  Have you been able to return to your normal activities? Yes Do you have any questions about your discharge instructions: Diet   No Medications  No Follow up visit  No  Do you have questions or concerns about your Care? No  Actions: * If pain score is 4 or above: No action needed, pain <4

## 2019-01-20 ENCOUNTER — Other Ambulatory Visit: Payer: Self-pay | Admitting: Family Medicine

## 2019-01-20 MED ORDER — CYCLOBENZAPRINE HCL 10 MG PO TABS: 10.0000 mg | ORAL_TABLET | Freq: Two times a day (BID) | ORAL | 0 refills | Status: DC | PRN

## 2019-01-20 NOTE — Telephone Encounter (Signed)
Will you address in Dr. Synthia Innocent absence?  Flexeril Last rx:  09/11/17, #30 Last OV:  10/21/18, CPE Next OV:  Chronic pain f/u

## 2019-01-20 NOTE — Telephone Encounter (Signed)
Refilled times one in pcp absence

## 2019-01-20 NOTE — Telephone Encounter (Signed)
Pt came into office stating the pharmacy has sent request over twice for Flexeril and he wants to know what what is the status on the refill. Will you please call patient as soon as possible?

## 2019-01-20 NOTE — Telephone Encounter (Signed)
Patient advised.

## 2019-01-20 NOTE — Telephone Encounter (Signed)
Pt request status of cyclobenzaprine refill request; advised pt that Dr Darnell Level is out of office and the request has been sent to another provider but that provider is seeing pt's now. Pt request cb when refill done.

## 2019-01-24 ENCOUNTER — Ambulatory Visit (INDEPENDENT_AMBULATORY_CARE_PROVIDER_SITE_OTHER)
Admission: RE | Admit: 2019-01-24 | Discharge: 2019-01-24 | Disposition: A | Discharge status: Home / Self Care | Payer: PPO | Source: Ambulatory Visit | Attending: Family Medicine | Admitting: Family Medicine

## 2019-01-24 ENCOUNTER — Ambulatory Visit (INDEPENDENT_AMBULATORY_CARE_PROVIDER_SITE_OTHER): Payer: PPO | Admitting: Family Medicine

## 2019-01-24 ENCOUNTER — Ambulatory Visit: Payer: PPO | Admitting: Family Medicine

## 2019-01-24 ENCOUNTER — Encounter: Payer: Self-pay | Admitting: Family Medicine

## 2019-01-24 VITALS — BP 124/78 | HR 120 | Temp 97.6°F | Ht 68.5 in | Wt 168.4 lb

## 2019-01-24 DIAGNOSIS — M5416 Radiculopathy, lumbar region: Secondary | ICD-10-CM

## 2019-01-24 DIAGNOSIS — J449 Chronic obstructive pulmonary disease, unspecified: Secondary | ICD-10-CM

## 2019-01-24 DIAGNOSIS — G8929 Other chronic pain: Secondary | ICD-10-CM | POA: Diagnosis not present

## 2019-01-24 DIAGNOSIS — Z87891 Personal history of nicotine dependence: Secondary | ICD-10-CM

## 2019-01-24 DIAGNOSIS — M4726 Other spondylosis with radiculopathy, lumbar region: Secondary | ICD-10-CM | POA: Diagnosis not present

## 2019-01-24 MED ORDER — PREDNISONE 20 MG PO TABS: ORAL_TABLET | ORAL | 0 refills | Status: DC

## 2019-01-24 MED ORDER — HYDROCODONE-ACETAMINOPHEN 5-325 MG PO TABS: 1.0000 | ORAL_TABLET | Freq: Four times a day (QID) | ORAL | 0 refills | Status: DC | PRN

## 2019-01-24 NOTE — Assessment & Plan Note (Signed)
Congratulated on remaining abstinent!

## 2019-01-24 NOTE — Patient Instructions (Signed)
I think you have pinching of nerve in your lower spine on left side causing symptoms. Treat with steroid taper sent today. Hydrocodone refilled to use as needed. Xray of lower back today. If not improving with above, let me know for MRI and referral to spine doctors for further evaluation and treatment.

## 2019-01-24 NOTE — Assessment & Plan Note (Signed)
Stable period. Appreciate pulm care.

## 2019-01-24 NOTE — Assessment & Plan Note (Addendum)
 CSRS reviewed today.  Hydrocodone refilled - last refilled 10/2018

## 2019-01-24 NOTE — Assessment & Plan Note (Signed)
Anticipate HNP leading to current symptoms (in h/o same 2010 treated conservatively back then with ESI). Will treat with steroid taper, refilled hydrocodone, check lumbar imaging. Discussed reasons to proceed with MRI - he will update me with effect.

## 2019-01-24 NOTE — Progress Notes (Signed)
BP 124/78 (BP Location: Left Arm, Patient Position: Sitting, Cuff Size: Normal)   Pulse (!) 120   Temp 97.6 F (36.4 C) (Oral)   Ht 5' 8.5" (1.74 m)   Wt 168 lb 7 oz (76.4 kg)   SpO2 93%   BMI 25.24 kg/m    CC: 3 mo pain visit Subjective:    Patient ID: Parker Martinez, male    DOB: [birth year], 78 y.o.   MRN: [id number]  HPI: Parker Martinez is a 78 y.o. male presenting on 01/24/2019 for Chronic Pain Management (Here for 3 mo f/u.)   2 wk h/o progressively worsening lower back pain with L radiculopathy. Radiation down buttock into posterior knee and lateral shin. No leg numbness or weakness. No fevers/chills, bowel/bladder incontinence. Able to walk bent forward without pain.  Denies inciting injury or trauma.   Chronic pain on vicodin 5/325mg , last filled #50 10/2018. Takes this for chronic shoulder arthritis pain and calf claudication (followed by Dr Scot Dock). Has handicap placard. Requests refill. Tolerates this well without constipation, aware of sedation risks. Drinks 0-2 alcoholic beverages per day. Aware not to mix these. Has been alternating hydrocodone with ES tylenol.   Known COPD quit smoking 06/2018. On symbicort, allegra, spiriva. Sees Dr Alva Garnet pulmonologist.   Has not previously had spine surgery but did have ESI.  If needed would want to see neurosurgery.      Relevant past medical, surgical, family and social history reviewed and updated as indicated. Interim medical history since our last visit reviewed. Allergies and medications reviewed and updated. Outpatient Medications Prior to Visit  Medication Sig Dispense Refill  . albuterol (VENTOLIN HFA) 108 (90 Base) MCG/ACT inhaler USE 2 PUFFS EVERY 6 HOURS AS NEEDED FOR WHEEZING 18 Inhaler 6  . alfuzosin (UROXATRAL) 10 MG 24 hr tablet Take 10 mg by mouth daily with breakfast.    . aspirin 81 MG tablet Take 81 mg by mouth every morning.     Marland Kitchen atorvastatin (LIPITOR) 40 MG tablet Take 1 tablet (40 mg total) by mouth  daily. 90 tablet 3  . cyclobenzaprine (FLEXERIL) 10 MG tablet Take 1 tablet (10 mg total) by mouth 2 (two) times daily as needed for muscle spasms. 30 tablet 0  . fexofenadine (ALLEGRA) 180 MG tablet Take 180 mg by mouth daily.    . montelukast (SINGULAIR) 10 MG tablet TAKE ONE TABLET BY MOUTH EVERY NIGHT AT BEDTIME 90 tablet 3  . Multiple Vitamin (MULTIVITAMIN) tablet Take 1 tablet by mouth daily.    . pantoprazole (PROTONIX) 40 MG tablet Take 1 tablet (40 mg total) by mouth 2 (two) times daily before a meal. 30 mins before breakfast and supper 180 tablet 3  . ranitidine (ZANTAC) 150 MG tablet Take 1 tablet (150 mg total) by mouth daily. 90 tablet 3  . SYMBICORT 160-4.5 MCG/ACT inhaler INHALE 2 PUFFS EVERY 12 HOURS TO PREVENT COUGH OR WHEEZE - RINSE/GARGLE/SPIT AFTER EACH USE 30.6 Inhaler 4  . Tiotropium Bromide Monohydrate (SPIRIVA RESPIMAT) 2.5 MCG/ACT AERS Inhale 2 puffs into the lungs daily. 4 g 10  . vitamin B-12 (CYANOCOBALAMIN) 500 MCG tablet Take 500 mcg by mouth every morning.     Marland Kitchen HYDROcodone-acetaminophen (NORCO/VICODIN) 5-325 MG tablet Take 1 tablet by mouth every 6 (six) hours as needed for moderate pain. 50 tablet 0   No facility-administered medications prior to visit.      Per HPI unless specifically indicated in ROS section below Review of Systems Objective:    BP  124/78 (BP Location: Left Arm, Patient Position: Sitting, Cuff Size: Normal)   Pulse (!) 120   Temp 97.6 F (36.4 C) (Oral)   Ht 5' 8.5" (1.74 m)   Wt 168 lb 7 oz (76.4 kg)   SpO2 93%   BMI 25.24 kg/m   Wt Readings from Last 3 Encounters:  01/24/19 168 lb 7 oz (76.4 kg)  12/18/18 173 lb 6 oz (78.6 kg)  10/21/18 171 lb 8 oz (77.8 kg)    Physical Exam Vitals signs and nursing note reviewed.  Constitutional:      Appearance: Normal appearance. He is not ill-appearing.  Musculoskeletal: Normal range of motion.     Comments: No pain midline spine No paraspinous mm tenderness ++ SLR on left No pain with  int/ext rotation at hip. Neg FABER. No pain at SIJ, GTB bilaterally.  + pain at L sciatic notch.   Skin:    General: Skin is warm and dry.  Neurological:     Mental Status: He is alert.     Sensory: Sensation is intact.     Motor: Motor function is intact.     Deep Tendon Reflexes:     Reflex Scores:      Patellar reflexes are 0 on the right side and 0 on the left side.    Comments: 5/5 strength BLE Diminished lower extremity reflexes bilaterally Antalgic gait, walks with cane       MR LUMBAR SPINE WO CONTRAST IMPRESSION 06/2009: 1.  Interval development of a moderate-sized right paracentral disc extrusion at L3-L4.  Caudal migration of disc material into the right L4 lateral recess results in right L4 nerve root compression and likely accounts for the patient's current symptoms. 2.  Interval improvement in previously demonstrated medial foraminal disc protrusion on the left at L4-L5.  The left lateral recess remains narrowed at that level. 3.  Stable chronic changes at L5-S1 with resulting moderate biforaminal stenosis. Assessment & Plan:   Problem List Items Addressed This Visit    Left lumbar radiculopathy - Primary    Anticipate HNP leading to current symptoms (in h/o same 2010 treated conservatively back then with ESI). Will treat with steroid taper, refilled hydrocodone, check lumbar imaging. Discussed reasons to proceed with MRI - he will update me with effect.       Relevant Orders   DG Lumbar Spine Complete   Ex-smoker    Congratulated on remaining abstinent!      Encounter for chronic pain management    [place] CSRS reviewed today.  Hydrocodone refilled - last refilled 10/2018      COPD, severe (HCC)    Stable period. Appreciate pulm care.       Relevant Medications   predniSONE (DELTASONE) 20 MG tablet       Meds ordered this encounter  Medications  . HYDROcodone-acetaminophen (NORCO/VICODIN) 5-325 MG tablet    Sig: Take 1 tablet by mouth every 6 (six) hours  as needed for moderate pain.    Dispense:  50 tablet    Refill:  0    For chronic pain  . predniSONE (DELTASONE) 20 MG tablet    Sig: Take two tablets daily for 3 days followed by one tablet daily for 4 days    Dispense:  10 tablet    Refill:  0   Orders Placed This Encounter  Procedures  . DG Lumbar Spine Complete    Standing Status:   Future    Standing Expiration Date:   03/24/2020  Order Specific Question:   Reason for Exam (SYMPTOM  OR DIAGNOSIS REQUIRED)    Answer:   L lumbar radiculopathy    Order Specific Question:   Preferred imaging location?    Answer:   [facility]    Order Specific Question:   Radiology Contrast Protocol - do NOT remove file path    Answer:   [email or web address]    Patient Instructions  I think you have pinching of nerve in your lower spine on left side causing symptoms. Treat with steroid taper sent today. Hydrocodone refilled to use as needed. Xray of lower back today. If not improving with above, let me know for MRI and referral to spine doctors for further evaluation and treatment.    Follow up plan: Return if symptoms worsen or fail to improve.  Ria Bush, MD

## 2019-01-25 ENCOUNTER — Encounter: Payer: Self-pay | Admitting: Family Medicine

## 2019-01-25 DIAGNOSIS — I700 Atherosclerosis of aorta: Secondary | ICD-10-CM | POA: Insufficient documentation

## 2019-01-25 DIAGNOSIS — I708 Atherosclerosis of other arteries: Secondary | ICD-10-CM | POA: Insufficient documentation

## 2019-01-27 ENCOUNTER — Telehealth: Payer: Self-pay

## 2019-01-27 NOTE — Telephone Encounter (Signed)
Patient advised of results. See result note

## 2019-01-27 NOTE — Telephone Encounter (Signed)
Left message for patient to call back about results. 

## 2019-02-03 ENCOUNTER — Telehealth: Payer: Self-pay

## 2019-02-03 DIAGNOSIS — M5416 Radiculopathy, lumbar region: Secondary | ICD-10-CM

## 2019-02-03 NOTE — Telephone Encounter (Signed)
Pt last seen 01/24/19; pt said he is no better and little worse with pain going down leg; pt said at appt mentioned possible MRI and referral to spine specialist if pt does not approve. If pt needs MRI pt prefers [facility] on [address].Pt request refill of hydrocodone apap also [facility].  Name of Medication: Hydrocodone apap 5- 325 mg [facility] of [facility] or Written Date and Quantity: # 50 on 01/24/19 Last Office Visit and Type:01/24/19 for back pain Next Office Visit and Type: 10/24/19 for annual Last Controlled Substance Agreement Date: 10/19/17 Last UDS:10/19/17  Pt did say the pharmacy would only give pt # 28 of hydrocodone apap 5-325 mg.

## 2019-02-03 NOTE — Telephone Encounter (Signed)
MR ordered. Lattie Haw - plz call check with pharmacy to see why they only provided #28 tablets hydrocodone when this is a chronic pain patient (I wrote it on the prescription).  Do they need another prescription from me, or can they fill rest of initial Rx I sent in?

## 2019-02-03 NOTE — Telephone Encounter (Signed)
Called the patient and gave him [facility] phone number to call to set up his MRI, they need to schedule with the patient.

## 2019-02-04 MED ORDER — HYDROCODONE-ACETAMINOPHEN 5-325 MG PO TABS: 1.0000 | ORAL_TABLET | Freq: Four times a day (QID) | ORAL | 0 refills | Status: DC | PRN

## 2019-02-04 NOTE — Telephone Encounter (Signed)
New Rx sent in

## 2019-02-04 NOTE — Telephone Encounter (Addendum)
Spoke with [facility] asking about pt's hydrocodone and to relay Dr. Synthia Innocent message.  Told we need to do a PA using the chronic pain dx.  PA would not process electronically. Did via phn.  PA approved as long as there is no gap in appts.  A new rx will need to be sent to pharmacy.

## 2019-02-04 NOTE — Addendum Note (Signed)
Addended by: Ria Bush on: 02/04/2019 05:22 PM   Modules accepted: Orders

## 2019-02-05 ENCOUNTER — Ambulatory Visit
Admission: RE | Admit: 2019-02-05 | Discharge: 2019-02-05 | Disposition: A | Discharge status: Home / Self Care | Payer: PPO | Source: Ambulatory Visit | Attending: Family Medicine | Admitting: Family Medicine

## 2019-02-05 DIAGNOSIS — M5416 Radiculopathy, lumbar region: Secondary | ICD-10-CM

## 2019-02-05 DIAGNOSIS — M48061 Spinal stenosis, lumbar region without neurogenic claudication: Secondary | ICD-10-CM | POA: Diagnosis not present

## 2019-02-06 ENCOUNTER — Other Ambulatory Visit: Payer: Self-pay | Admitting: Family Medicine

## 2019-02-06 ENCOUNTER — Telehealth: Payer: Self-pay | Admitting: Family Medicine

## 2019-02-06 DIAGNOSIS — M5416 Radiculopathy, lumbar region: Secondary | ICD-10-CM

## 2019-02-06 NOTE — Telephone Encounter (Signed)
Patient is being referred to a neurosurgeon for a ruptured disc.  Patient's wife called and asked if the referral can go to [place].

## 2019-02-06 NOTE — Telephone Encounter (Signed)
Urgent Neurosurgery Referral sent over to see Dr [name] the Dr on call for today, patient is aware.

## 2019-02-11 ENCOUNTER — Other Ambulatory Visit: Payer: Self-pay | Admitting: Neurological Surgery

## 2019-02-11 ENCOUNTER — Telehealth: Payer: Self-pay

## 2019-02-11 DIAGNOSIS — M5126 Other intervertebral disc displacement, lumbar region: Secondary | ICD-10-CM | POA: Diagnosis not present

## 2019-02-11 NOTE — Telephone Encounter (Signed)
Received pre-op pulmonary clearance from neurosurgery & spine. Placed in DS box for completion.

## 2019-02-17 ENCOUNTER — Other Ambulatory Visit: Payer: Self-pay | Admitting: Family Medicine

## 2019-02-17 NOTE — Telephone Encounter (Signed)
Pt came into office to request a refill on hydrocodone and flexiril. Advised him hydrocodone was just filled on 3/3 and he states he knows, that he has a surgery coming up on 3/27 and will need more.

## 2019-02-18 NOTE — Addendum Note (Signed)
Addended by: Brenton Grills on: 5/72/6203 55:97 AM   Modules accepted: Orders

## 2019-02-18 NOTE — Telephone Encounter (Signed)
Last office visit 01/24/2019 for Chronic pain.  Last refilled 01/20/2019 for #30 with no refills. Next Appt: 10/24/2019 for CPE.

## 2019-02-18 NOTE — Telephone Encounter (Signed)
Flexeril Last rx:  01/20/19, #30 Last OV:  01/24/19, pain f/u Next OV:  10/25/19, CPE Pt 2

## 2019-02-19 MED ORDER — HYDROCODONE-ACETAMINOPHEN 5-325 MG PO TABS: 1.0000 | ORAL_TABLET | Freq: Four times a day (QID) | ORAL | 0 refills | Status: DC | PRN

## 2019-02-19 NOTE — Telephone Encounter (Addendum)
E prescribed hydrocodone.

## 2019-02-19 NOTE — Addendum Note (Signed)
Addended by: Ria Bush on: 02/19/2019 09:48 AM   Modules accepted: Orders

## 2019-02-19 NOTE — Pre-Procedure Instructions (Signed)
Parker Martinez  02/19/2019      [place], [place] - [address], [address], [place] [address] Phone: [phone] Fax: [phone]  [id number] [place], [place] [address] Phone: [phone] Fax: [phone]    Your procedure is scheduled on Mar. 27  Report to [facility] Admitting Entrance A at 5:30 A.M.  Call this number if you have problems the morning of surgery:  [phone]   Remember:  Do not eat or drink after midnight.      Take these medicines the morning of surgery with A SIP OF WATER :              Albuterol inhaler-bring to hospital            alfuzosin (uroxatral)            Cyclobenzaprine (flexeril) if needed            Allegra            Hydrocodone if needed            Pantoprazole (protonix)            symbicort--bring to hospital            spiriva -bring to hospital            7 days prior to surgery STOP taking any Aspirin (unless otherwise instructed by your surgeon), Aleve, Naproxen, Ibuprofen, Motrin, Advil, Goody's, BC's, all herbal medications, fish oil, and all vitamins.               Follow your surgeon's instructions on when to stop Asprin.  If no instructions were given by your surgeon then you will need to call the office to get those instructions.      Do not wear jewelry.  Do not wear lotions, powders, or perfumes, or deodorant.  Do not shave 48 hours prior to surgery.  Men may shave face and neck.  Do not bring valuables to the hospital.  [facility] is not responsible for any belongings or valuables.  Contacts, dentures or bridgework may not be worn into surgery.  Leave your suitcase in the car.  After surgery it may be brought to your room.  For patients admitted to the hospital, discharge time will be determined by your treatment team.  Patients discharged the day of surgery will not be allowed to drive home.     Special instructions:   [place]- Preparing For Surgery  Before surgery, you can play an important role. Because skin is not sterile, your skin needs to be as free of germs as possible. You can reduce the number of germs on your skin by washing with CHG (chlorahexidine gluconate) Soap before surgery.  CHG is an antiseptic cleaner which kills germs and bonds with the skin to continue killing germs even after washing.    Oral Hygiene is also important to reduce your risk of infection.  Remember - BRUSH YOUR TEETH THE MORNING OF SURGERY WITH YOUR REGULAR TOOTHPASTE  Please do not use if you have an allergy to CHG or antibacterial soaps. If your skin becomes reddened/irritated stop using the CHG.  Do not shave (including legs and underarms) for at least 48 hours prior to first CHG shower. It is OK to shave your face.  Please follow these instructions carefully.   1. Shower the [employer] BEFORE SURGERY  and the MORNING OF SURGERY with CHG.   2. If you chose to wash your hair, wash your hair first as usual with your normal shampoo.  3. After you shampoo, rinse your hair and body thoroughly to remove the shampoo.  4. Use CHG as you would any other liquid soap. You can apply CHG directly to the skin and wash gently with a scrungie or a clean washcloth.   5. Apply the CHG Soap to your body ONLY FROM THE NECK DOWN.  Do not use on open wounds or open sores. Avoid contact with your eyes, ears, mouth and genitals (private parts). Wash Face and genitals (private parts)  with your normal soap.  6. Wash thoroughly, paying special attention to the area where your surgery will be performed.  7. Thoroughly rinse your body with warm water from the neck down.  8. DO NOT shower/wash with your normal soap after using and rinsing off the CHG Soap.  9. Pat yourself dry with a CLEAN TOWEL.  10. Wear CLEAN PAJAMAS to bed the night before surgery, wear comfortable clothes the morning of surgery  11. Place  CLEAN SHEETS on your bed the night of your first shower and DO NOT SLEEP WITH PETS.    Day of Surgery:  Do not apply any deodorants/lotions.  Please wear clean clothes to the hospital/surgery center.   Remember to brush your teeth WITH YOUR REGULAR TOOTHPASTE.    Please read over the following fact sheets that you were given. Coughing and Deep Breathing, MRSA Information and Surgical Site Infection Prevention

## 2019-02-20 ENCOUNTER — Ambulatory Visit: Payer: PPO | Attending: Pulmonary Disease

## 2019-02-20 ENCOUNTER — Other Ambulatory Visit: Payer: Self-pay

## 2019-02-20 ENCOUNTER — Encounter: Payer: Self-pay | Admitting: Pulmonary Disease

## 2019-02-20 ENCOUNTER — Encounter (HOSPITAL_COMMUNITY)
Admission: RE | Admit: 2019-02-20 | Discharge: 2019-02-20 | Disposition: A | Discharge status: Home / Self Care | Payer: PPO | Source: Ambulatory Visit | Attending: Neurological Surgery | Admitting: Neurological Surgery

## 2019-02-20 ENCOUNTER — Ambulatory Visit: Payer: PPO | Admitting: Pulmonary Disease

## 2019-02-20 ENCOUNTER — Telehealth: Payer: Self-pay | Admitting: Pulmonary Disease

## 2019-02-20 ENCOUNTER — Encounter (HOSPITAL_COMMUNITY): Payer: Self-pay

## 2019-02-20 VITALS — BP 132/76 | HR 105 | Ht 68.5 in | Wt 171.0 lb

## 2019-02-20 DIAGNOSIS — Z01811 Encounter for preprocedural respiratory examination: Secondary | ICD-10-CM

## 2019-02-20 DIAGNOSIS — I700 Atherosclerosis of aorta: Secondary | ICD-10-CM | POA: Diagnosis not present

## 2019-02-20 DIAGNOSIS — J449 Chronic obstructive pulmonary disease, unspecified: Secondary | ICD-10-CM

## 2019-02-20 DIAGNOSIS — Z87891 Personal history of nicotine dependence: Secondary | ICD-10-CM | POA: Diagnosis not present

## 2019-02-20 DIAGNOSIS — M5126 Other intervertebral disc displacement, lumbar region: Secondary | ICD-10-CM | POA: Insufficient documentation

## 2019-02-20 DIAGNOSIS — R062 Wheezing: Secondary | ICD-10-CM | POA: Diagnosis not present

## 2019-02-20 DIAGNOSIS — Z01818 Encounter for other preprocedural examination: Secondary | ICD-10-CM | POA: Insufficient documentation

## 2019-02-20 LAB — PROTIME-INR
INR: 1 (ref 0.8–1.2)
Prothrombin Time: 12.6 seconds (ref 11.4–15.2)

## 2019-02-20 LAB — BASIC METABOLIC PANEL
Anion gap: 8 (ref 5–15)
BUN: 5 mg/dL — ABNORMAL LOW (ref 8–23)
CO2: 26 mmol/L (ref 22–32)
Calcium: 9.7 mg/dL (ref 8.9–10.3)
Chloride: 104 mmol/L (ref 98–111)
Creatinine, Ser: 0.77 mg/dL (ref 0.61–1.24)
GFR calc Af Amer: >60 mL/min (ref >60)
GFR calc non Af Amer: >60 mL/min (ref >60)
Glucose, Bld: 112 mg/dL — ABNORMAL HIGH (ref 70–99)
Potassium: 3.9 mmol/L (ref 3.5–5.1)
Sodium: 138 mmol/L (ref 135–145)

## 2019-02-20 LAB — CBC WITH DIFFERENTIAL/PLATELET
Abs Immature Granulocytes: 0.05 10*3/uL (ref 0.00–0.07)
Basophils Absolute: 0 10*3/uL (ref 0.0–0.1)
Basophils Relative: 0 %
Eosinophils Absolute: 0.1 10*3/uL (ref 0.0–0.5)
Eosinophils Relative: 2 %
HCT: 39.9 % (ref 39.0–52.0)
Hemoglobin: 12.7 g/dL — ABNORMAL LOW (ref 13.0–17.0)
Immature Granulocytes: 1 %
Lymphocytes Relative: 33 %
Lymphs Abs: 2.4 10*3/uL (ref 0.7–4.0)
MCH: 30.5 pg (ref 26.0–34.0)
MCHC: 31.8 g/dL (ref 30.0–36.0)
MCV: 95.9 fL (ref 80.0–100.0)
Monocytes Absolute: 0.7 10*3/uL (ref 0.1–1.0)
Monocytes Relative: 10 %
Neutro Abs: 4 10*3/uL (ref 1.7–7.7)
Neutrophils Relative %: 54 %
Platelets: 239 10*3/uL (ref 150–400)
RBC: 4.16 MIL/uL — ABNORMAL LOW (ref 4.22–5.81)
RDW: 12.1 % (ref 11.5–15.5)
WBC: 7.3 10*3/uL (ref 4.0–10.5)
nRBC: 0 % (ref 0.0–0.2)

## 2019-02-20 LAB — SURGICAL PCR SCREEN
MRSA, PCR: NEGATIVE
Staphylococcus aureus: NEGATIVE

## 2019-02-20 MED ORDER — IPRATROPIUM-ALBUTEROL 0.5-2.5 (3) MG/3ML IN SOLN
3.0000 mL | Freq: Once | RESPIRATORY_TRACT | Status: AC
  Administered 2019-02-20: 3 mL via RESPIRATORY_TRACT

## 2019-02-20 MED ORDER — PREDNISONE 20 MG PO TABS: 40.0000 mg | ORAL_TABLET | Freq: Every day | ORAL | 0 refills | Status: AC

## 2019-02-20 NOTE — Patient Instructions (Signed)
Duoneb administered in office today PFTs ordered for today Continue Symbicort and Spiriva inhalers as previously prescribed Continue Singulair (montelukast) 10 mg daily at bedtime After you get through upcoming surgery, you may try discontinuation of Singulair I will communicate with Dr. [name] and anesthesiologist regarding perioperative management of COPD Follow-up in 6 months.  Call sooner if needed

## 2019-02-20 NOTE — Telephone Encounter (Signed)
Pulmonary clearance has been faxed back to [place] neurosurgery. Received fax confirmation.

## 2019-02-20 NOTE — Addendum Note (Signed)
Addended by: Merton Border B on: 02/20/2019 03:19 PM   Modules accepted: Orders

## 2019-02-20 NOTE — Progress Notes (Signed)
PCP - Dr. Leo Grosser Cardiologist - denies Pulmonary- Dr. Alva Garnet- per patient- Dr. Alva Garnet requests that we have his cell number on hand incase anesthesia needs to speak with him- [phone]  Chest x-ray - 02/20/19 EKG - 02/20/19 Stress Test - denies ECHO - 12/19 Cardiac Cath- denies  Sleep Study - denies  Aspirin Instructions: Patient instructed to hold all Aspirin, NSAID's, herbal medications, fish oil and vitamins 7 days prior to surgery.   Anesthesia review:   Patient denies shortness of breath, fever, cough and chest pain at PAT appointment   Patient verbalized understanding of instructions that were given to them at the PAT appointment. Patient was also instructed that they will need to review over the PAT instructions again at home before surgery.

## 2019-02-20 NOTE — Progress Notes (Addendum)
PULMONARY OFFICE FOLLOW-UP NOTE  Requesting MD/Service: [name] Date of initial consultation: 05/06/18 Reason for consultation: Severe COPD, smoker  PT PROFILE: 78 y.o. male former smoker (previously > 1 PPD, quit 06/04/2018) with diagnosis of COPD rendered several years ago and with progressive DOE over past 5-7 years  DATA: 10/19/10 Spirometry: Severe obstruction (FEV1 41% predicted) 05/28/18 PFTs: FVC: 2.13 > 2.96 L (58 > 80 %pred), FEV1: 0.85 > 1.04 L (30 > 36 %pred), FEV1/FVC: 40 >35%, TLC: 7.90 L (128 %pred), DLCO 50 %pred.  Flow volume curve consistent with severe obstruction   INTERVAL: Last visit 07/22/18.  No major pulmonary events in the interim.    SUBJ:  This is a scheduled follow-up visit.  He remains abstinent from cigarettes.  He feels that his breathing has improved as a result.  He remains on Symbicort, Spiriva, montelukast.  He rarely uses albuterol rescue inhaler.  Most significant concern is that he has developed a ruptured disc in the past month and is scheduled for a microscopic discectomy 02/28/2019 to be performed under general anesthesia.  This will be performed by Dr. Sherley Bounds at [facility] in [place].  His pain is severe and he is unable to walk.  Presently, he denies CP, fever, purulent sputum, hemoptysis, LE edema and calf tenderness.  He is scheduled for PFTs later today and notably did not use any of his maintenance medications this morning   Vitals:   02/20/19 0907  BP: 132/76  Pulse: (!) 105  SpO2: 94%  Weight: 171 lb (77.6 kg)  Height: 5' 8.5" (1.74 m)  RA   EXAM:  Gen: Chronically ill-appearing, no overt distress HEENT: NCAT, sclera white Neck: No JVD Lungs: Diffuse coarse wheezes Cardiovascular: RRR, no murmurs Abdomen: Soft, nontender, normal BS Ext: without clubbing, cyanosis, edema Neuro: grossly intact Skin: Limited exam, no lesions noted   DATA:   BMP Latest Ref Rng & Units 10/15/2018 10/09/2017 09/22/2016   Glucose 70 - 99 mg/dL 113(H) 114(H) 94  BUN 6 - 23 mg/dL 6 9 7   Creatinine 0.40 - 1.50 mg/dL 0.84 0.80 0.84  Sodium 135 - 145 mEq/L 142 139 143  Potassium 3.5 - 5.1 mEq/L 5.0 4.3 4.7  Chloride 96 - 112 mEq/L 103 101 106  CO2 19 - 32 mEq/L 30 30 31   Calcium 8.4 - 10.5 mg/dL 10.0 9.9 9.8    CBC Latest Ref Rng & Units 10/15/2018 10/09/2017 09/22/2016  WBC 4.0 - 10.5 K/uL 7.5 8.6 6.2  Hemoglobin 13.0 - 17.0 g/dL 14.3 14.7 14.4  Hematocrit 39.0 - 52.0 % 42.2 43.6 42.0  Platelets 150.0 - 400.0 K/uL 237.0 269.0 244.0    CXR: No new film  I have personally reviewed all chest radiographs reported above including CXRs and CT chest unless otherwise indicated  IMPRESSION:     ICD-10-CM   1. Pre-operative respiratory examination Z01.811   2. COPD, very severe ([place]) J44.9 ipratropium-albuterol (DUONEB) 0.5-2.5 (3) MG/3ML nebulizer solution 3 mL  3. Wheezing R06.2   4. COPD, severe ([place]) J44.9    He clearly has a reversible clinical component to his airflow obstruction as evidenced by intermittent wheezing (and significant wheezing during today's encounter).  With regard to preoperative pulmonary evaluation, he will be considered a high risk patient but this is a relatively low risk procedure.  He is in extreme pain and is extremely limited in his activity by that pain.  Therefore, it is important to proceed with surgery as this back injury is having a  major adverse impact on his life.  At the present time, his pulmonary status is at baseline in general and there are no major interventions that can further mitigate his risk of perioperative pulmonary complications  PLAN:  I again congratulated him on his success at smoking cessation thus far.  We again discussed risk of relapse and ways that people relapse.  I cautioned him regarding this and strategies to avoid smoking relapse.  DuoNeb was administered in the office today.  Pred 40 mg daily X 5 days  PFTs, previously ordered, are scheduled  for today  Continue Symbicort, Spiriva inhalers as previously prescribed  Continue montelukast (Singulair) 10 mg daily at bedtime for now.  Continue albuterol inhaler as needed  After surgery is completed and he is fully recovered, he may try discontinuation of Singulair as it is unlikely that it is doing much to benefit his COPD  With regard to perioperative management of COPD, I recommend the following: 1) continue his COPD regimen as outlined above up until the time of surgery including the morning of surgery  2) nebulized DuoNeb preoperatively and on a scheduled basis postoperatively for the first 24 hours 3) early mobilization and DVT prophylaxis as indicated 4) cautious use of opiate analgesics to avoid impairing ventilatory drive 5) if he develops any respiratory/pulmonary problems postoperatively, please contact pulmonary service for assistance in management  He will follow-up in this office in 6 months and is instructed to call sooner if needed   Merton Border, MD PCCM service Mobile [phone] Pager [phone] 02/20/2019 10:40 AM

## 2019-02-20 NOTE — Telephone Encounter (Signed)
Rx for prednisone 40mg  x5d has been sent to preferred pharmacy.  Pt is aware and voiced his understanding. Nothing further is needed.

## 2019-02-21 ENCOUNTER — Encounter (HOSPITAL_COMMUNITY): Payer: Self-pay | Admitting: Physician Assistant

## 2019-02-21 NOTE — Anesthesia Preprocedure Evaluation (Deleted)
Anesthesia Evaluation    Airway        Dental   Pulmonary former smoker,           Cardiovascular hypertension,      Neuro/Psych    GI/Hepatic   Endo/Other    Renal/GU      Musculoskeletal   Abdominal   Peds  Hematology   Anesthesia Other Findings   Reproductive/Obstetrics                             Anesthesia Physical Anesthesia Plan  ASA:   Anesthesia Plan:    Post-op Pain Management:    Induction:   PONV Risk Score and Plan:   Airway Management Planned:   Additional Equipment:   Intra-op Plan:   Post-operative Plan:   Informed Consent:   Plan Discussed with:   Anesthesia Plan Comments: (Severe COPD followed by Dr. Alva Garnet. Seen 02/20/19 for preop clearance. PFTs 02/19/09 showed severe obstructive airway disease without significant BD response. Per Dr. Alva Garnet' note "With regard to preoperative pulmonary evaluation, he will be considered a high risk patient but this is a relatively low risk procedure.  He is in extreme pain and is extremely limited in his activity by that pain.  Therefore, it is important to proceed with surgery as this back injury is having a major adverse impact on his life.  At the present time, his pulmonary status is at baseline in general and there are no major interventions that can further mitigate his risk of perioperative pulmonary complications"  Echo [id number]/7276 showed EF 60%-65%, no significant valvular abnormalities.  )        Anesthesia Quick Evaluation

## 2019-02-26 DIAGNOSIS — M21372 Foot drop, left foot: Secondary | ICD-10-CM | POA: Diagnosis not present

## 2019-02-26 DIAGNOSIS — M5126 Other intervertebral disc displacement, lumbar region: Secondary | ICD-10-CM | POA: Diagnosis not present

## 2019-02-26 DIAGNOSIS — M5116 Intervertebral disc disorders with radiculopathy, lumbar region: Secondary | ICD-10-CM | POA: Diagnosis not present

## 2019-02-28 ENCOUNTER — Ambulatory Visit (HOSPITAL_COMMUNITY): Admission: RE | Admit: 2019-02-28 | Payer: PPO | Source: Home / Self Care | Admitting: Neurological Surgery

## 2019-02-28 ENCOUNTER — Encounter (HOSPITAL_COMMUNITY): Admission: RE | Payer: Self-pay | Source: Home / Self Care

## 2019-02-28 SURGERY — LUMBAR LAMINECTOMY/DECOMPRESSION MICRODISCECTOMY 1 LEVEL
Anesthesia: General | Site: Back | Laterality: Left

## 2019-03-04 ENCOUNTER — Encounter: Payer: Self-pay | Admitting: Family Medicine

## 2019-03-05 HISTORY — PX: LUMBAR MICRODISCECTOMY: SHX99

## 2019-03-11 ENCOUNTER — Ambulatory Visit: Payer: PPO

## 2019-03-19 ENCOUNTER — Other Ambulatory Visit: Payer: Self-pay | Admitting: Family Medicine

## 2019-03-28 DIAGNOSIS — C61 Malignant neoplasm of prostate: Secondary | ICD-10-CM | POA: Diagnosis not present

## 2019-03-28 DIAGNOSIS — R3915 Urgency of urination: Secondary | ICD-10-CM | POA: Diagnosis not present

## 2019-04-01 DIAGNOSIS — C61 Malignant neoplasm of prostate: Secondary | ICD-10-CM | POA: Diagnosis not present

## 2019-06-02 ENCOUNTER — Telehealth: Payer: Self-pay | Admitting: Pulmonary Disease

## 2019-06-02 MED ORDER — SPIRIVA RESPIMAT 2.5 MCG/ACT IN AERS: 2.0000 | INHALATION_SPRAY | Freq: Every day | RESPIRATORY_TRACT | 2 refills | Status: DC

## 2019-06-02 NOTE — Telephone Encounter (Signed)
Spoke to patient, [facility] Nothing further at this time.

## 2019-09-09 ENCOUNTER — Ambulatory Visit (INDEPENDENT_AMBULATORY_CARE_PROVIDER_SITE_OTHER): Payer: PPO

## 2019-09-09 DIAGNOSIS — Z23 Encounter for immunization: Secondary | ICD-10-CM

## 2019-09-29 DIAGNOSIS — Z8546 Personal history of malignant neoplasm of prostate: Secondary | ICD-10-CM | POA: Diagnosis not present

## 2019-09-29 DIAGNOSIS — R3915 Urgency of urination: Secondary | ICD-10-CM | POA: Diagnosis not present

## 2019-09-29 DIAGNOSIS — C61 Malignant neoplasm of prostate: Secondary | ICD-10-CM | POA: Diagnosis not present

## 2019-09-29 DIAGNOSIS — R350 Frequency of micturition: Secondary | ICD-10-CM | POA: Diagnosis not present

## 2019-10-08 ENCOUNTER — Encounter: Payer: Self-pay | Admitting: Pulmonary Disease

## 2019-10-08 ENCOUNTER — Ambulatory Visit: Payer: PPO | Admitting: Pulmonary Disease

## 2019-10-08 ENCOUNTER — Other Ambulatory Visit: Payer: Self-pay

## 2019-10-08 VITALS — BP 124/68 | HR 123 | Temp 97.9°F | Ht 69.5 in | Wt 161.0 lb

## 2019-10-08 DIAGNOSIS — R000 Tachycardia, unspecified: Secondary | ICD-10-CM | POA: Diagnosis not present

## 2019-10-08 DIAGNOSIS — R0609 Other forms of dyspnea: Secondary | ICD-10-CM

## 2019-10-08 DIAGNOSIS — J449 Chronic obstructive pulmonary disease, unspecified: Secondary | ICD-10-CM | POA: Diagnosis not present

## 2019-10-08 DIAGNOSIS — R0600 Dyspnea, unspecified: Secondary | ICD-10-CM

## 2019-10-08 DIAGNOSIS — F17210 Nicotine dependence, cigarettes, uncomplicated: Secondary | ICD-10-CM | POA: Diagnosis not present

## 2019-10-08 DIAGNOSIS — J300 Vasomotor rhinitis: Secondary | ICD-10-CM

## 2019-10-08 MED ORDER — IPRATROPIUM BROMIDE 0.06 % NA SOLN: 2.0000 | Freq: Four times a day (QID) | NASAL | 6 refills | Status: DC | PRN

## 2019-10-08 NOTE — Progress Notes (Signed)
Subjective:    Patient ID: Parker Martinez, male    DOB: 10-21-1941, 78 y.o.   MRN: [id number]  PULMONARY OFFICE FOLLOW-UP NOTE  Requesting MD/Service: [name] Date of initial consultation: 05/06/18 by Dr. Merton Border Reason for consultation: Severe COPD, smoker  PT PROFILE: 78 y.o. male former smoker (previously > 1 PPD, with diagnosis of COPD rendered several years ago and with progressive DOE over past 5-7 years  DATA: 10/19/10 Spirometry: Severe obstruction (FEV1 41% predicted) 05/28/18 PFTs: FVC: 2.13 > 2.96 L (58 > 80 %pred), FEV1: 0.85 > 1.04 L (30 > 36 %pred), FEV1/FVC: 40 >35%, TLC: 7.90 L (128 %pred), DLCO 50 %pred.  Flow volume curve consistent with severe obstruction 02/20/19 PFTs: FEV1 was 1.96 L or 48% predicted, FVC was 2.72 L or 95% predicted, FEV1/FVC was 38%.  TLC was 131%, RV 165%, indicating hyperinflation and air trapping.  Diffusion capacity was 48%.  Findings consistent with severe obstruction and emphysema.  INTERVAL: Last visit 02/20/2019 with Dr. Alva Garnet.  No major pulmonary events in the interim.  Has unfortunately resumed smoking   HPI This is a scheduled follow-up visit with this 78 year old current smoker (1/4-1/2 a pack of cigarettes per day) who previously followed with Dr. Merton Border for COPD.  I am assuming care as Dr. Alva Garnet left the practice.  Previously he had noted that while abstinent of cigarettes his breathing was better.  He has resumed this habit and notices that he has had some increased issues with dyspnea.  Cough mostly in the mornings productive of whitish to grayish sputum.  He has not had any fevers, chills or sweats.  No chest pain, no hemoptysis, no lower extremity edema nor calf tenderness.  He is currently maintained on Symbicort, Spiriva and as needed albuterol.  Significant rhinorrhea on a daily basis particularly after eating.    He voices no other complaints.  He is followed by cardiology Review of Systems A 10 point  review of systems was performed and it is as noted above otherwise negative.  Allergies  Allergen Reactions  . Flonase [Fluticasone Propionate] Other (See Comments)    Per pt, causes rebound effect    Current medications were reviewed with the patient.  Social History   Tobacco Use  . Smoking status: Current Every Day Smoker    Packs/day: 0.50    Years: 60.00    Pack years: 30.00    Types: Cigarettes  . Smokeless tobacco: Never Used  . Tobacco comment: 4-5 cigs per day 10/20/2019  Substance Use Topics  . Alcohol use: Yes    Alcohol/week: 14.0 standard drinks    Types: 14 Cans of beer per week    Comment: socially        Objective:   Physical Exam BP 124/68 (BP Location: Left Arm, Cuff Size: Normal)   Pulse (!) 123   Temp 97.9 F (36.6 C) (Temporal)   Ht 5' 9.5" (1.765 m)   Wt 161 lb (73 kg)   SpO2 99% Comment: on RA  BMI 23.43 kg/m   GENERAL: Disheveled appearing elderly male, no acute distress.  Use of accessories, chronic.  Presents in transport chair (due to dyspnea). HEAD: Normocephalic, atraumatic.  EYES: Pupils equal, round, reactive to light.  No scleral icterus.  MOUTH: Nose/mouth/throat not examined due to masking requirements for COVID 19. NECK: Supple. No thyromegaly. Trachea midline. No JVD.  No adenopathy. PULMONARY: Lungs clear to auscultation bilaterally. CARDIOVASCULAR: S1 and S2.  Tachycardic rate with regular rhythm.  No  rubs, murmurs or gallops. GASTROINTESTINAL: Benign. MUSCULOSKELETAL: No joint deformity, no clubbing, no edema.  NEUROLOGIC: No overt focal deficit noted.  Speech is fluent. SKIN: Intact,warm,dry. PSYCH: Mood and behavior are normal.     Assessment & Plan:     ICD-10-CM   1. Stage 3 severe COPD by GOLD classification ([place])  J44.9    Continue Symbicort, Spiriva daily and albuterol as needed He was counseled with regards to discontinuation of smoking follow-up in 3 months time    2. Severe exertional dyspnea, likely fully  explained by COPD  R06.00    He has severe COPD Prior ambulatory oximetry benign  3. Vasomotor rhinitis  J30.0    Trial of Atrovent nasal  4. Tachycardia  R00.0    Chronic issue Follows with cardiology Query cor pulmonale Query related to alcohol use  5. Tobacco dependence due to cigarettes  F17.210    Patient was counseled regards to discontinuation of smoking Total counseling time 35 minutes   Meds ordered this encounter  Medications  . DISCONTD: ipratropium (ATROVENT) 0.06 % nasal spray    Sig: Place 2 sprays into both nostrils 4 (four) times daily as needed for rhinitis.    Dispense:  15 mL    Refill:  6   Discussion:  Patient has severe COPD and unfortunately he has resumed smoking.  He was counseled with regards to discontinuation of smoking.  She also was noted to be tachycardic, this appears to be a chronic issue this may be related to cor pulmonale however the patient also has significant use of alcohol given the week.  I have counseled the patient with regards to curtailing his alcohol intake.  For his apparent vasomotor rhinitis (by clinical impression) will provide him with a trial of Atrovent nasal.  We will see him in follow-up in 3 months time he is to contact us prior to that time should any new difficulties arise.   Renold Don, MD [facility]   *This note was dictated using voice recognition software/Dragon.  Despite best efforts to proofread, errors can occur which can change the meaning.  Any change was purely unintentional.

## 2019-10-08 NOTE — Patient Instructions (Signed)
1.  Sent a prescription for Atrovent nasal inhaler he can use up to 4 times a day as needed for your severe postnasal drip  2.  Continue Symbicort and Spiriva.  3.  Highly recommend that you discontinue all smoking.  4.  Follow-up in 3 months time.  Call sooner should any new difficulties arise.  Marland Kitchen

## 2019-10-14 ENCOUNTER — Other Ambulatory Visit: Payer: Self-pay | Admitting: Family Medicine

## 2019-10-14 DIAGNOSIS — C61 Malignant neoplasm of prostate: Secondary | ICD-10-CM

## 2019-10-14 DIAGNOSIS — I10 Essential (primary) hypertension: Secondary | ICD-10-CM

## 2019-10-14 DIAGNOSIS — E559 Vitamin D deficiency, unspecified: Secondary | ICD-10-CM

## 2019-10-14 DIAGNOSIS — E785 Hyperlipidemia, unspecified: Secondary | ICD-10-CM

## 2019-10-14 DIAGNOSIS — D751 Secondary polycythemia: Secondary | ICD-10-CM

## 2019-10-17 ENCOUNTER — Encounter: Payer: PPO | Admitting: Family Medicine

## 2019-10-17 ENCOUNTER — Other Ambulatory Visit: Payer: Self-pay

## 2019-10-17 ENCOUNTER — Ambulatory Visit: Payer: PPO

## 2019-10-17 ENCOUNTER — Other Ambulatory Visit (INDEPENDENT_AMBULATORY_CARE_PROVIDER_SITE_OTHER): Payer: PPO

## 2019-10-17 DIAGNOSIS — E785 Hyperlipidemia, unspecified: Secondary | ICD-10-CM

## 2019-10-17 DIAGNOSIS — D751 Secondary polycythemia: Secondary | ICD-10-CM

## 2019-10-17 DIAGNOSIS — E559 Vitamin D deficiency, unspecified: Secondary | ICD-10-CM

## 2019-10-17 DIAGNOSIS — C61 Malignant neoplasm of prostate: Secondary | ICD-10-CM | POA: Diagnosis not present

## 2019-10-17 LAB — CBC WITH DIFFERENTIAL/PLATELET
Basophils Absolute: 0 10*3/uL (ref 0.0–0.1)
Basophils Relative: 0.4 % (ref 0.0–3.0)
Eosinophils Absolute: 0.2 10*3/uL (ref 0.0–0.7)
Eosinophils Relative: 2 % (ref 0.0–5.0)
HCT: 41.6 % (ref 39.0–52.0)
Hemoglobin: 13.9 g/dL (ref 13.0–17.0)
Lymphocytes Relative: 36.9 % (ref 12.0–46.0)
Lymphs Abs: 2.9 10*3/uL (ref 0.7–4.0)
MCHC: 33.3 g/dL (ref 30.0–36.0)
MCV: 96.9 fl (ref 78.0–100.0)
Monocytes Absolute: 0.7 10*3/uL (ref 0.1–1.0)
Monocytes Relative: 8.6 % (ref 3.0–12.0)
Neutro Abs: 4 10*3/uL (ref 1.4–7.7)
Neutrophils Relative %: 52.1 % (ref 43.0–77.0)
Platelets: 235 10*3/uL (ref 150.0–400.0)
RBC: 4.3 Mil/uL (ref 4.22–5.81)
RDW: 12.6 % (ref 11.5–15.5)
WBC: 7.8 10*3/uL (ref 4.0–10.5)

## 2019-10-17 LAB — COMPREHENSIVE METABOLIC PANEL
ALT: 19 U/L (ref 0–53)
AST: 20 U/L (ref 0–37)
Albumin: 4 g/dL (ref 3.5–5.2)
Alkaline Phosphatase: 69 U/L (ref 39–117)
BUN: 5 mg/dL — ABNORMAL LOW (ref 6–23)
CO2: 28 mEq/L (ref 19–32)
Calcium: 9.2 mg/dL (ref 8.4–10.5)
Chloride: 103 mEq/L (ref 96–112)
Creatinine, Ser: 0.78 mg/dL (ref 0.40–1.50)
GFR: 95.92 mL/min (ref >60.00)
Glucose, Bld: 105 mg/dL — ABNORMAL HIGH (ref 70–99)
Potassium: 4.1 mEq/L (ref 3.5–5.1)
Sodium: 144 mEq/L (ref 135–145)
Total Bilirubin: 0.6 mg/dL (ref 0.2–1.2)
Total Protein: 6.2 g/dL (ref 6.0–8.3)

## 2019-10-17 LAB — VITAMIN D 25 HYDROXY (VIT D DEFICIENCY, FRACTURES): VITD: 40.52 ng/mL (ref 30.00–100.00)

## 2019-10-17 LAB — LIPID PANEL
Cholesterol: 153 mg/dL (ref 0–200)
HDL: 91 mg/dL (ref >39.00)
LDL Cholesterol: 46 mg/dL (ref 0–99)
NonHDL: 62.03
Total CHOL/HDL Ratio: 2
Triglycerides: 80 mg/dL (ref 0.0–149.0)
VLDL: 16 mg/dL (ref 0.0–40.0)

## 2019-10-17 LAB — PSA: PSA: 0.03 ng/mL — ABNORMAL LOW (ref 0.10–4.00)

## 2019-10-20 ENCOUNTER — Ambulatory Visit (INDEPENDENT_AMBULATORY_CARE_PROVIDER_SITE_OTHER): Payer: PPO

## 2019-10-20 VITALS — BP 124/68 | HR 123 | Temp 97.9°F | Wt 161.0 lb

## 2019-10-20 DIAGNOSIS — Z0000 Encounter for general adult medical examination without abnormal findings: Secondary | ICD-10-CM | POA: Diagnosis not present

## 2019-10-20 NOTE — Patient Instructions (Signed)
Parker Martinez , Thank you for taking time to come for your Medicare Wellness Visit. I appreciate your ongoing commitment to your health goals. Please review the following plan we discussed and let me know if I can assist you in the future.   Screening recommendations/referrals: Colonoscopy: Up to date, completed 10/30/2016 Recommended yearly ophthalmology/optometry visit for glaucoma screening and checkup Recommended yearly dental visit for hygiene and checkup  Vaccinations: Influenza vaccine: Up to date, completed 09/09/2019 Pneumococcal vaccine: Completed series Tdap vaccine: declined Shingles vaccine: declined    Advanced directives: Please bring a copy of your POA (Power of Attorney) and/or Living Will to your next appointment.   Conditions/risks identified: hypertension, hyperlipidemia  Next appointment: 10/24/2019 @ 10:30 am   Preventive Care 79 Years and Older, Male Preventive care refers to lifestyle choices and visits with your health care provider that can promote health and wellness. What does preventive care include?  A yearly physical exam. This is also called an annual well check.  Dental exams once or twice a year.  Routine eye exams. Ask your health care provider how often you should have your eyes checked.  Personal lifestyle choices, including:  Daily care of your teeth and gums.  Regular physical activity.  Eating a healthy diet.  Avoiding tobacco and drug use.  Limiting alcohol use.  Practicing safe sex.  Taking low doses of aspirin every day.  Taking vitamin and mineral supplements as recommended by your health care provider. What happens during an annual well check? The services and screenings done by your health care provider during your annual well check will depend on your age, overall health, lifestyle risk factors, and family history of disease. Counseling  Your health care provider may ask you questions about your:  Alcohol use.  Tobacco  use.  Drug use.  Emotional well-being.  Home and relationship well-being.  Sexual activity.  Eating habits.  History of falls.  Memory and ability to understand (cognition).  Work and work Statistician. Screening  You may have the following tests or measurements:  Height, weight, and BMI.  Blood pressure.  Lipid and cholesterol levels. These may be checked every 5 years, or more frequently if you are over 79 years old.  Skin check.  Lung cancer screening. You may have this screening every year starting at age 79 if you have a 30-pack-year history of smoking and currently smoke or have quit within the past 15 years.  Fecal occult blood test (FOBT) of the stool. You may have this test every year starting at age 79.  Flexible sigmoidoscopy or colonoscopy. You may have a sigmoidoscopy every 5 years or a colonoscopy every 10 years starting at age 79.  Prostate cancer screening. Recommendations will vary depending on your family history and other risks.  Hepatitis C blood test.  Hepatitis B blood test.  Sexually transmitted disease (STD) testing.  Diabetes screening. This is done by checking your blood sugar (glucose) after you have not eaten for a while (fasting). You may have this done every 1-3 years.  Abdominal aortic aneurysm (AAA) screening. You may need this if you are a current or former smoker.  Osteoporosis. You may be screened starting at age 79 if you are at high risk. Talk with your health care provider about your test results, treatment options, and if necessary, the need for more tests. Vaccines  Your health care provider may recommend certain vaccines, such as:  Influenza vaccine. This is recommended every year.  Tetanus, diphtheria, and acellular  pertussis (Tdap, Td) vaccine. You may need a Td booster every 10 years.  Zoster vaccine. You may need this after age 79.  Pneumococcal 13-valent conjugate (PCV13) vaccine. One dose is recommended after age  79.  Pneumococcal polysaccharide (PPSV23) vaccine. One dose is recommended after age 79. Talk to your health care provider about which screenings and vaccines you need and how often you need them. This information is not intended to replace advice given to you by your health care provider. Make sure you discuss any questions you have with your health care provider. Document Released: 12/17/2015 Document Revised: 08/09/2016 Document Reviewed: 09/21/2015 Elsevier Interactive Patient Education  2017 [place] Prevention in the Home Falls can cause injuries. They can happen to people of all ages. There are many things you can do to make your home safe and to help prevent falls. What can I do on the outside of my home?  Regularly fix the edges of walkways and driveways and fix any cracks.  Remove anything that might make you trip as you walk through a door, such as a raised step or threshold.  Trim any bushes or trees on the path to your home.  Use bright outdoor lighting.  Clear any walking paths of anything that might make someone trip, such as rocks or tools.  Regularly check to see if handrails are loose or broken. Make sure that both sides of any steps have handrails.  Any raised decks and porches should have guardrails on the edges.  Have any leaves, snow, or ice cleared regularly.  Use sand or salt on walking paths during winter.  Clean up any spills in your garage right away. This includes oil or grease spills. What can I do in the bathroom?  Use night lights.  Install grab bars by the toilet and in the tub and shower. Do not use towel bars as grab bars.  Use non-skid mats or decals in the tub or shower.  If you need to sit down in the shower, use a plastic, non-slip stool.  Keep the floor dry. Clean up any water that spills on the floor as soon as it happens.  Remove soap buildup in the tub or shower regularly.  Attach bath mats securely with double-sided  non-slip rug tape.  Do not have throw rugs and other things on the floor that can make you trip. What can I do in the bedroom?  Use night lights.  Make sure that you have a light by your bed that is easy to reach.  Do not use any sheets or blankets that are too big for your bed. They should not hang down onto the floor.  Have a firm chair that has side arms. You can use this for support while you get dressed.  Do not have throw rugs and other things on the floor that can make you trip. What can I do in the kitchen?  Clean up any spills right away.  Avoid walking on wet floors.  Keep items that you use a lot in easy-to-reach places.  If you need to reach something above you, use a strong step stool that has a grab bar.  Keep electrical cords out of the way.  Do not use floor polish or wax that makes floors slippery. If you must use wax, use non-skid floor wax.  Do not have throw rugs and other things on the floor that can make you trip. What can I do with my stairs?  Do not leave any items on the stairs.  Make sure that there are handrails on both sides of the stairs and use them. Fix handrails that are broken or loose. Make sure that handrails are as long as the stairways.  Check any carpeting to make sure that it is firmly attached to the stairs. Fix any carpet that is loose or worn.  Avoid having throw rugs at the top or bottom of the stairs. If you do have throw rugs, attach them to the floor with carpet tape.  Make sure that you have a light switch at the top of the stairs and the bottom of the stairs. If you do not have them, ask someone to add them for you. What else can I do to help prevent falls?  Wear shoes that:  Do not have high heels.  Have rubber bottoms.  Are comfortable and fit you well.  Are closed at the toe. Do not wear sandals.  If you use a stepladder:  Make sure that it is fully opened. Do not climb a closed stepladder.  Make sure that both  sides of the stepladder are locked into place.  Ask someone to hold it for you, if possible.  Clearly mark and make sure that you can see:  Any grab bars or handrails.  First and last steps.  Where the edge of each step is.  Use tools that help you move around (mobility aids) if they are needed. These include:  Canes.  Walkers.  Scooters.  Crutches.  Turn on the lights when you go into a dark area. Replace any light bulbs as soon as they burn out.  Set up your furniture so you have a clear path. Avoid moving your furniture around.  If any of your floors are uneven, fix them.  If there are any pets around you, be aware of where they are.  Review your medicines with your doctor. Some medicines can make you feel dizzy. This can increase your chance of falling. Ask your doctor what other things that you can do to help prevent falls. This information is not intended to replace advice given to you by your health care provider. Make sure you discuss any questions you have with your health care provider. Document Released: 09/16/2009 Document Revised: 04/27/2016 Document Reviewed: 12/25/2014 Elsevier Interactive Patient Education  2017 [employer].

## 2019-10-20 NOTE — Progress Notes (Signed)
PCP notes:  Health Maintenance: Decline Tdap and shingrix   Abnormal Screenings: none   Patient concerns: none   Nurse concerns: None   Next PCP appt.: 10/24/2019 @ 10:30 am

## 2019-10-20 NOTE — Progress Notes (Signed)
Subjective:   GERRICK RAY is a 78 y.o. male who presents for Medicare Annual/Subsequent preventive examination.  Review of Systems: N/A   This visit is being conducted through telemedicine via telephone at the nurse health advisor's home address due to the COVID-19 pandemic. This patient has given me verbal consent via doximity to conduct this visit, patient states they are participating from their home address. Patient and myself are on the telephone call. There is no referral for this visit. Some vital signs may be absent or patient reported.    Patient identification: identified by name, DOB, and current address   Cardiac Risk Factors include: advanced age (>33men, >39 women);dyslipidemia;hypertension;male gender;smoking/ tobacco exposure     Objective:    Vitals: BP 124/68   Pulse (!) 123   Temp 97.9 F (36.6 C) (Oral)   Wt 161 lb (73 kg)   SpO2 99%   BMI 23.43 kg/m   Body mass index is 23.43 kg/m.  Advanced Directives 10/20/2019 02/20/2019 10/15/2018 08/01/2018 10/09/2017 06/14/2017 11/03/2015  Does Patient Have a Medical Advance Directive? Yes Yes Yes Yes Yes Yes Yes  Type of Paramedic of [place];Living will [place];Living will [place];Living will [place];Living will [place];Living will [place];Living will [place];Living will  Does patient want to make changes to medical advance directive? - No - Patient declined - - - - No - Patient declined  Copy of [place] in Chart? No - copy requested Yes - validated most recent copy scanned in chart (See row information) Yes - validated most recent copy scanned in chart (See row information) - No - copy requested No - copy requested No - copy requested    Tobacco Social History   Tobacco Use  Smoking Status Current Every Day Smoker  . Packs/day: 0.50  . Years:  60.00  . Pack years: 30.00  . Types: Cigarettes  Smokeless Tobacco Never Used  Tobacco Comment   4-5 cigs per day 10/20/2019     Ready to quit: Not Answered Counseling given: Not Answered Comment: 4-5 cigs per day 10/20/2019   Clinical Intake:  Pre-visit preparation completed: Yes  Pain : 0-10 Pain Score: 4  Pain Type: Chronic pain Pain Location: Back Pain Orientation: Lower Pain Descriptors / Indicators: Aching Pain Onset: More than a month ago Pain Frequency: Intermittent     Nutritional Risks: Nausea/ vomitting/ diarrhea(always has diarrhea) Diabetes: No  How often do you need to have someone help you when you read instructions, pamphlets, or other written materials from your doctor or pharmacy?: 1 - Never What is the last grade level you completed in school?: college  Interpreter Needed?: No  Information entered by :: CJohnson, LPN  Past Medical History:  Diagnosis Date  . Abnormal drug screen 10/2013, 03/2015   low Cr, +EtOH, neg hydrocodone (10/2013, again 03/2015)  . Allergic rhinitis   . Arthritis    lumbar and cervical spine  . Carotid stenosis 09/11/2014   40-59% bilat ICA stenosis, rpt 1 yr (09/2014)   . COPD (chronic obstructive pulmonary disease) (HCC)    severe, participated in [place] study  . DDD (degenerative disc disease)    lumbar (HNP R L3/4) s/p ESI, cervical  . Diverticulitis 2015   by CT  . Diverticulosis 2008   severe by colonoscopy and CT  . Duodenitis   . Esophageal stricture 2012   s/p dilation  .  GERD (gastroesophageal reflux disease)   . Hemangioma    congenital left arm  . History of kidney stones remote  . HTN (hypertension)    borderline readings at home  . Idiopathic anaphylaxis    h/o recurrent anaphylaxis, unknown trigger, s/p eval at [facility] [phone]), last episode 03/2011  . PAD (peripheral artery disease) ([place])   . Personal history of colonic adenomas 09/02/2013  . Prostate cancer [facility]) 2011   prostate  seed implant (Dr. [name])  . Smoker   . Status post dilatation of esophageal stricture   . Subclavian steal syndrome 10/12/2016   Left subclavian steal, with 18 mmHg brachial artery pressure gradient (10/2016). If symptomatic, rec PV consult   Past Surgical History:  Procedure Laterality Date  . BALLOON DILATION N/A 10/02/2014   Procedure: BALLOON DILATION;  Surgeon: Gatha Mayer, MD;  Location: [facility];  Service: Endoscopy;  Laterality: N/A;  . COLONOSCOPY  08/2013   4 tubular adenomas, severe divertic, rpt 3 yrs [name])  . COLONOSCOPY  10/2016   severe diverticulosis, no f/u recommended [name])  . ESOPHAGOGASTRODUODENOSCOPY  10/2016   2 rings dilated [name])  . ESOPHAGOGASTRODUODENOSCOPY  01/2019   dilated esophageal webs [name])  . ESOPHAGOGASTRODUODENOSCOPY (EGD) WITH PROPOFOL N/A 10/02/2014   WNL, mild esophagitis; Gatha Mayer, MD  . INSERTION PROSTATE RADIATION SEED  01/2015   [name]  . SHOULDER SURGERY  1961   due to chronic dislocation  . SPIROMETRY  10/2010   severe obstruction, FEV1 41%, ratio 0.46  . TONSILLECTOMY  1944 ?  . UPPER GASTROINTESTINAL ENDOSCOPY  06/08/2009   dilated esophageal stricture, duodenitis  . virtual colonoscopy  2007   [name], rpt rec 5 yrs   Family History  Problem Relation Age of Onset  . Lung cancer Father   . Hyperlipidemia Father   . Arthritis Mother   . Breast cancer Daughter   . Coronary artery disease Neg Hx   . Stroke Neg Hx   . Diabetes Neg Hx    Social History   Socioeconomic History  . Marital status: Married    Spouse name: Not on file  . Number of children: 1  . Years of education: Not on file  . Highest education level: Not on file  Occupational History  . Occupation: retired    Fish farm manager: RETIRED  Social Needs  . Financial resource strain: Not hard at all  . Food insecurity    Worry: Never true    Inability: Never true  . Transportation needs    Medical: No    Non-medical: No  Tobacco Use  .  Smoking status: Current Every Day Smoker    Packs/day: 0.50    Years: 60.00    Pack years: 30.00    Types: Cigarettes  . Smokeless tobacco: Never Used  . Tobacco comment: 4-5 cigs per day 10/20/2019  Substance and Sexual Activity  . Alcohol use: Yes    Alcohol/week: 14.0 standard drinks    Types: 14 Cans of beer per week    Comment: socially  . Drug use: No  . Sexual activity: Not on file  Lifestyle  . Physical activity    Days per week: 0 days    Minutes per session: 0 min  . Stress: Not at all  Relationships  . Social Herbalist on phone: Not on file    Gets together: Not on file    Attends religious service: Not on file    Active member  of club or organization: Not on file    Attends meetings of clubs or organizations: Not on file    Relationship status: Not on file  Other Topics Concern  . 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 Wife is Ladean Raya. "no extraordinary efforts" does not want feeding tube.      Tested negative for hydrocodone and positive for alcohol on UDS 10/2013.  Recommend Q6 mo testing and will need to discuss EtOH use at next visit.    Outpatient Encounter Medications as of 10/20/2019  Medication Sig  . albuterol (PROAIR HFA) 108 (90 Base) MCG/ACT inhaler USE 2 PUFFS EVERY 6 HOURS AS NEEDED FOR WHEEZING  . alfuzosin (UROXATRAL) 10 MG 24 hr tablet Take 10 mg by mouth daily with breakfast.  . aspirin 81 MG tablet Take 81 mg by mouth every morning.   Marland Kitchen atorvastatin (LIPITOR) 40 MG tablet Take 1 tablet (40 mg total) by mouth daily.  . cyclobenzaprine (FLEXERIL) 10 MG tablet TAKE 1 TABLET BY MOUTH TWICE A DAY AS NEEDED FOR MUSCLE  SPASMS  . fexofenadine (ALLEGRA) 180 MG tablet Take 180 mg by mouth daily.  Marland Kitchen HYDROcodone-acetaminophen (NORCO/VICODIN) 5-325 MG tablet Take 1 tablet by mouth every 6 (six) hours as needed for moderate pain.  Marland Kitchen ipratropium (ATROVENT) 0.06 % nasal spray Place 2 sprays into both nostrils 4 (four) times daily as needed for rhinitis.  Marland Kitchen montelukast (SINGULAIR) 10 MG tablet TAKE ONE TABLET BY MOUTH EVERY NIGHT AT BEDTIME  . Multiple Vitamin (MULTIVITAMIN) tablet Take 1 tablet by mouth daily.  . pantoprazole (PROTONIX) 40 MG tablet Take 1 tablet (40 mg total) by mouth 2 (two) times daily before a meal. 30 mins before breakfast and supper  . pravastatin (PRAVACHOL) 40 MG tablet Take 40 mg by mouth daily.  . SYMBICORT 160-4.5 MCG/ACT inhaler INHALE 2 PUFFS EVERY 12 HOURS TO PREVENT COUGH OR WHEEZE - RINSE/GARGLE/SPIT AFTER EACH USE (Patient taking differently: Inhale 2 puffs into the lungs 2 (two) times daily. )  . Tiotropium Bromide Monohydrate (SPIRIVA RESPIMAT) 2.5 MCG/ACT AERS Inhale 2 puffs into the lungs daily.  . vitamin B-12 (CYANOCOBALAMIN) 500 MCG tablet Take 500 mcg by mouth every morning.    No facility-administered encounter medications on file as of 10/20/2019.     Activities of Daily Living In your present state of health, do you have any difficulty performing the following activities: 10/20/2019 02/20/2019  Hearing? Y N  Comment has some hearing issues -  Vision? Y N  Comment has some vision issues -  Difficulty concentrating or making decisions? N N  Walking or climbing stairs? Tempie Donning  Comment has back pain -  Dressing or bathing? N N  Doing errands, shopping? N N  Preparing Food and eating ? N -  Using the Toilet? N -  In the past six months, have you accidently leaked urine? N -  Do you have problems with loss of bowel control? N -  Managing your Medications? N -  Managing your Finances? N -  Housekeeping or managing your Housekeeping? N -  Some recent data might be hidden     Patient Care Team: Ria Bush, MD as  PCP - General (Family Medicine) [name], MD as Consulting Physician (Ophthalmology) Angelia Mould, MD as Consulting Physician (Vascular Surgery) Gatha Mayer, MD as Consulting Physician (Gastroenterology) Myrlene Broker, MD as Attending Physician (Urology) Wilhelmina Mcardle, MD as Consulting Physician (Pulmonary Disease)   Assessment:   This is a routine wellness examination for Josias.  Exercise Activities and Dietary recommendations Current Exercise Habits: Home exercise routine, Type of exercise: stretching, Time (Minutes): 15, Frequency (Times/Week): 7, Weekly Exercise (Minutes/Week): 105, Intensity: Mild, Exercise limited by: None identified  Goals    . Increase physical activity     Starting 10/15/2018, I will continue to take medications as prescribed.     . Patient Stated     10/20/2019, I will maintain and continue medications as prescribed.        Fall Risk Fall Risk  10/20/2019 10/15/2018 10/09/2017 09/20/2016 09/16/2015  Falls in the past year? 0 0 No No No  Number falls in past yr: 0 - - - -  Comment - - - - -  Injury with Fall? 0 - - - -  Comment - - - - -  Risk for fall due to : Medication side effect - - - -  Follow up Falls evaluation completed;Falls prevention discussed - - - -   Is the patient's home free of loose throw rugs in walkways, pet beds, electrical cords, etc?   yes      Grab bars in the bathroom? yes      Handrails on the stairs?   yes      Adequate lighting?   yes  Timed Get Up and Go Performed: N/A  Depression Screen PHQ 2/9 Scores 10/20/2019 10/15/2018 08/01/2018 10/09/2017  PHQ - 2 Score 0 0 0 0  PHQ- 9 Score 0 0 - 0    Cognitive Function MMSE - Mini Mental State Exam 10/20/2019 10/15/2018 10/09/2017  Orientation to time 4 5 5   Orientation to Place 5 5 5   Registration 3 3 3   Attention/ Calculation 5 0 0  Recall 3 3 3   Language- name 2 objects - 0 0  Language-  repeat 1 1 1   Language- follow 3 step command - 3 3  Language- read & follow direction - 0 0  Write a sentence - 0 0  Copy design - 0 0  Total score - 20 20  Mini Cog  Mini-Cog screen was completed. Maximum score is 22. A value of 0 denotes this part of the MMSE was not completed or the patient failed this part of the Mini-Cog screening.       Immunization History  Administered Date(s) Administered  . Fluad Quad(high Dose 65+) 09/09/2019  . Influenza,inj,Quad PF,6+ Mos 09/11/2014, 09/24/2015, 09/20/2016, 09/14/2017, 08/29/2018  . Pneumococcal Conjugate-13 09/11/2014  . Pneumococcal Polysaccharide-23 12/04/2009    Qualifies for Shingles Vaccine? Yes  Screening Tests Health Maintenance  Topic Date Due  . DTaP/Tdap/Td (1 - Tdap) 10/19/2020 (Originally 05/31/1960)  . TETANUS/TDAP  10/19/2020 (Originally 05/31/1960)  . COLONOSCOPY  10/31/2019  . INFLUENZA VACCINE  Completed  . PNA vac Low Risk Adult  Completed   Cancer Screenings: Lung: Low Dose CT Chest recommended if Age 73-80 years, 30 pack-year currently smoking OR have quit w/in 15years. Patient does not qualify. Colorectal: completed 10/30/2016  Additional Screenings:  Hepatitis C Screening: N/A      Plan:   Patient will maintain and continue medications as prescribed.   I have personally reviewed and noted the  following in the patient's chart:   . Medical and social history . Use of alcohol, tobacco or illicit drugs  . Current medications and supplements . Functional ability and status . Nutritional status . Physical activity . Advanced directives . List of other physicians . Hospitalizations, surgeries, and ER visits in previous 12 months . Vitals . Screenings to include cognitive, depression, and falls . Referrals and appointments  In addition, I have reviewed and discussed with patient certain preventive protocols, quality metrics, and best practice recommendations. A written personalized care plan for  preventive services as well as general preventive health recommendations were provided to patient.     Andrez Grime, LPN  [id number]

## 2019-10-24 ENCOUNTER — Encounter: Payer: Self-pay | Admitting: Family Medicine

## 2019-10-24 ENCOUNTER — Other Ambulatory Visit: Payer: Self-pay

## 2019-10-24 ENCOUNTER — Ambulatory Visit (INDEPENDENT_AMBULATORY_CARE_PROVIDER_SITE_OTHER): Payer: PPO | Admitting: Family Medicine

## 2019-10-24 VITALS — BP 122/68 | HR 102 | Temp 98.2°F | Ht 68.5 in | Wt 159.2 lb

## 2019-10-24 DIAGNOSIS — F17200 Nicotine dependence, unspecified, uncomplicated: Secondary | ICD-10-CM

## 2019-10-24 DIAGNOSIS — I739 Peripheral vascular disease, unspecified: Secondary | ICD-10-CM | POA: Diagnosis not present

## 2019-10-24 DIAGNOSIS — Z0000 Encounter for general adult medical examination without abnormal findings: Secondary | ICD-10-CM

## 2019-10-24 DIAGNOSIS — I10 Essential (primary) hypertension: Secondary | ICD-10-CM

## 2019-10-24 DIAGNOSIS — E785 Hyperlipidemia, unspecified: Secondary | ICD-10-CM

## 2019-10-24 DIAGNOSIS — I700 Atherosclerosis of aorta: Secondary | ICD-10-CM | POA: Diagnosis not present

## 2019-10-24 DIAGNOSIS — E559 Vitamin D deficiency, unspecified: Secondary | ICD-10-CM

## 2019-10-24 DIAGNOSIS — C61 Malignant neoplasm of prostate: Secondary | ICD-10-CM | POA: Diagnosis not present

## 2019-10-24 DIAGNOSIS — I708 Atherosclerosis of other arteries: Secondary | ICD-10-CM

## 2019-10-24 DIAGNOSIS — G458 Other transient cerebral ischemic attacks and related syndromes: Secondary | ICD-10-CM

## 2019-10-24 DIAGNOSIS — I6523 Occlusion and stenosis of bilateral carotid arteries: Secondary | ICD-10-CM | POA: Diagnosis not present

## 2019-10-24 DIAGNOSIS — J449 Chronic obstructive pulmonary disease, unspecified: Secondary | ICD-10-CM | POA: Diagnosis not present

## 2019-10-24 DIAGNOSIS — I70213 Atherosclerosis of native arteries of extremities with intermittent claudication, bilateral legs: Secondary | ICD-10-CM

## 2019-10-24 DIAGNOSIS — H9193 Unspecified hearing loss, bilateral: Secondary | ICD-10-CM

## 2019-10-24 MED ORDER — MONTELUKAST SODIUM 10 MG PO TABS: 10.0000 mg | ORAL_TABLET | Freq: Every day | ORAL | 3 refills | Status: DC

## 2019-10-24 MED ORDER — ATORVASTATIN CALCIUM 40 MG PO TABS: 40.0000 mg | ORAL_TABLET | Freq: Every day | ORAL | 3 refills | Status: DC

## 2019-10-24 NOTE — Patient Instructions (Addendum)
If interested, check with pharmacy about new 2 shot shingles series (shingrix).  I will order carotid ultrasound for next month. We will refer you back to vascular doctor to follow along with [place].  Good to see you today. Return as needed or in 1 year for next physical. Look below at some dental clinic options.   [place] [facility] and [place] [email or web address]  [place]: [facility] in [place] [phone], [facility] in [place] [phone] [place]: [facility] [phone]  [facility] [address] in [place] [phone]) Discount dental care office with plans to help patient cover expenses. In addition, they also have walk - in emergency hours, patient can wait to be seen.   [facility] [phone]) This clinic caters to the indigent population and is on a lottery system. Location: [employer] [facility], [facility], [place], [place] [facility] Hours: Wednesdays from 6pm - 9pm, patients seen by a lottery system. For dates, call or go to [email or web address] Services: Cleanings, fillings and simple extractions. Payment Options: DENTAL WORK IS FREE OF CHARGE. Bring proof of income or support. Best way to get seen: Arrive at 5:15 pm - this is a lottery, NOT first come/first serve, so arriving earlier will not increase your chances of being seen.   Health Maintenance After Age 59 After age 37, you are at a higher risk for certain long-term diseases and infections as well as injuries from falls. Falls are a major cause of broken bones and head injuries in people who are older than age 33. Getting regular preventive care can help to keep you healthy and well. Preventive care includes getting regular testing and making lifestyle changes as  recommended by your health care provider. Talk with your health care provider about:  Which screenings and tests you should have. A screening is a test that checks for a disease when you have no symptoms.  A diet and exercise plan that is right for you. What should I know about screenings and tests to prevent falls? Screening and testing are the best ways to find a health problem early. Early diagnosis and treatment give you the best chance of managing medical conditions that are common after age 6. Certain conditions and lifestyle choices may make you more likely to have a fall. Your health care provider may recommend:  Regular vision checks. Poor vision and conditions such as cataracts can make you more likely to have a fall. If you wear glasses, make sure to get your prescription updated if your vision changes.  Medicine review. Work with your health care provider to regularly review all of the medicines you are taking, including over-the-counter medicines. Ask your health care provider about any side effects that may make you more likely to have a fall. Tell your health care provider if any medicines that you take make you feel dizzy or sleepy.  Osteoporosis screening. Osteoporosis is a condition that causes the bones to get weaker. This can make the bones weak and cause them to break more easily.  Blood pressure screening. Blood pressure changes and medicines to control blood pressure can make you feel dizzy.  Strength and balance checks. Your health care provider may recommend certain tests to check your strength and balance while standing, walking, or changing positions.  Foot health exam. Foot pain and numbness, as well as not wearing proper footwear, can make you  more likely to have a fall.  Depression screening. You may be more likely to have a fall if you have a fear of falling, feel emotionally low, or feel unable to do activities that you used to do.  Alcohol use screening. Using  too much alcohol can affect your balance and may make you more likely to have a fall. What actions can I take to lower my risk of falls? General instructions  Talk with your health care provider about your risks for falling. Tell your health care provider if: ? You fall. Be sure to tell your health care provider about all falls, even ones that seem minor. ? You feel dizzy, sleepy, or off-balance.  Take over-the-counter and prescription medicines only as told by your health care provider. These include any supplements.  Eat a healthy diet and maintain a healthy weight. A healthy diet includes low-fat dairy products, low-fat (lean) meats, and fiber from whole grains, beans, and lots of fruits and vegetables. Home safety  Remove any tripping hazards, such as rugs, cords, and clutter.  Install safety equipment such as grab bars in bathrooms and safety rails on stairs.  Keep rooms and walkways well-lit. Activity   Follow a regular exercise program to stay fit. This will help you maintain your balance. Ask your health care provider what types of exercise are appropriate for you.  If you need a cane or walker, use it as recommended by your health care provider.  Wear supportive shoes that have nonskid soles. Lifestyle  Do not drink alcohol if your health care provider tells you not to drink.  If you drink alcohol, limit how much you have: ? 0-1 drink a day for women. ? 0-2 drinks a day for men.  Be aware of how much alcohol is in your drink. In the [place] one drink equals one typical bottle of beer (12 oz), one-half glass of wine (5 oz), or one shot of hard liquor (1 oz).  Do not use any products that contain nicotine or tobacco, such as cigarettes and e-cigarettes. If you need help quitting, ask your health care provider. Summary  Having a healthy lifestyle and getting preventive care can help to protect your health and wellness after age 95.  Screening and testing are the best way  to find a health problem early and help you avoid having a fall. Early diagnosis and treatment give you the best chance for managing medical conditions that are more common for people who are older than age 69.  Falls are a major cause of broken bones and head injuries in people who are older than age 30. Take precautions to prevent a fall at home.  Work with your health care provider to learn what changes you can make to improve your health and wellness and to prevent falls. This information is not intended to replace advice given to you by your health care provider. Make sure you discuss any questions you have with your health care provider. Document Released: 10/03/2017 Document Revised: 03/13/2019 Document Reviewed: 10/03/2017 Elsevier Patient Education  2020 [employer].

## 2019-10-24 NOTE — Assessment & Plan Note (Signed)
Return to [facility].

## 2019-10-24 NOTE — Assessment & Plan Note (Signed)
Has restarted a few cig/day. Encouraged full cessation

## 2019-10-24 NOTE — Progress Notes (Signed)
This visit was conducted in person.  BP 122/68 (BP Location: Left Arm, Patient Position: Sitting, Cuff Size: Normal)   Pulse (!) 102   Temp 98.2 F (36.8 C) (Temporal)   Ht 5' 8.5" (1.74 m)   Wt 159 lb 4 oz (72.2 kg)   SpO2 95%   BMI 23.86 kg/m    CC: CPE Subjective:    Patient ID: Parker Martinez, male    DOB: [birth year], 77 y.o.   MRN: [id number]  HPI: Parker Martinez is a 78 y.o. male presenting on 10/24/2019 for Annual Exam (Prt 2. )   Saw health advisor Monday for medicare wellness visit. Note reviewed.   Hard of hearing. Does not wear hearing aides.   Had L lumbar microdiscectomy 03/2019 [name]).   No exam data present    Clinical Support from 10/20/2019 in [place] [facility] Total Score  0      Fall Risk  10/20/2019 10/15/2018 10/09/2017 09/20/2016 09/16/2015  Falls in the past year? 0 0 No No No  Number falls in past yr: 0 - - - -  Comment - - - - -  Injury with Fall? 0 - - - -  Comment - - - - -  Risk for fall due to : Medication side effect - - - -  Follow up Falls evaluation completed;Falls prevention discussed - - - -      Known COPD - quit smoking 2019 - restarted a few weeks ago (a few cigarettes a day). Continues symbicort, allegra, spiriva. Now seeing Dr [name] - started on atrovent nasal spray with benefit.   Preventative: COLONOSCOPY11/2017 - severe diverticulosis, no f/u recommended [name]) Prostate cancer - prostate seeds 2016 (Dr. [name] at [facility]). Normal recent CT scan. Sees Dr [name] yearly.  Lung cancer screening -[facility]  Flu shot yearly Tetanus - unsure Pneumonia 2011, prevnar 2015 shingrix - discussed  Advanced directives: has living will at home, scanned into chart (09/2014). Wife is Ladean Raya. "No extraordinary efforts" does not want feeding tube. Seat belt use discussed  Sunscreen use discussed. No changing moles on skin. Smoker a few cigarettes a day - quit last year but  restarted recently. Alcohol - 1-2 drinks/day - likes scotch  Dentist due but finances limit ability to seek care - local indigent dental clinic info provided for patient to call and inquire. Known poor dentition. Eye exam - upcoming appt 12/2019 (Dr Edison Pace at [facility])  Caffeine: 2-4 cups coffee  Lives with wife, 1 bulldog, grown child 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 Interim medical history since our last visit reviewed. Allergies and medications reviewed and updated. Outpatient Medications Prior to Visit  Medication Sig Dispense Refill  . albuterol (PROAIR HFA) 108 (90 Base) MCG/ACT inhaler USE 2 PUFFS EVERY 6 HOURS AS NEEDED FOR WHEEZING 18 Inhaler 6  . alfuzosin (UROXATRAL) 10 MG 24 hr tablet Take 10 mg by mouth daily with breakfast.    . aspirin 81 MG tablet Take 81 mg by mouth every morning.     . cyclobenzaprine (FLEXERIL) 10 MG tablet TAKE 1 TABLET BY MOUTH TWICE A DAY AS NEEDED FOR MUSCLE SPASMS 30 tablet 0  . fexofenadine (ALLEGRA) 180 MG tablet Take 180 mg by mouth daily.    Marland Kitchen HYDROcodone-acetaminophen (NORCO/VICODIN) 5-325 MG tablet Take  1 tablet by mouth every 6 (six) hours as needed for moderate pain. 50 tablet 0  . ipratropium (ATROVENT) 0.06 % nasal spray Place 2 sprays into both nostrils 4 (four) times daily as needed for rhinitis. 15 mL 6  . Multiple Vitamin (MULTIVITAMIN) tablet Take 1 tablet by mouth daily.    . pantoprazole (PROTONIX) 40 MG tablet Take 1 tablet (40 mg total) by mouth 2 (two) times daily before a meal. 30 mins before breakfast and supper 180 tablet 3  . SYMBICORT 160-4.5 MCG/ACT inhaler INHALE 2 PUFFS EVERY 12 HOURS TO PREVENT COUGH OR WHEEZE - RINSE/GARGLE/SPIT AFTER EACH USE (Patient taking differently: Inhale 2 puffs into the lungs 2 (two) times  daily. ) 30.6 Inhaler 4  . Tiotropium Bromide Monohydrate (SPIRIVA RESPIMAT) 2.5 MCG/ACT AERS Inhale 2 puffs into the lungs daily. 4 g 2  . vitamin B-12 (CYANOCOBALAMIN) 500 MCG tablet Take 500 mcg by mouth every morning.     Marland Kitchen atorvastatin (LIPITOR) 40 MG tablet Take 1 tablet (40 mg total) by mouth daily. 90 tablet 3  . montelukast (SINGULAIR) 10 MG tablet TAKE ONE TABLET BY MOUTH EVERY NIGHT AT BEDTIME 90 tablet 3  . pravastatin (PRAVACHOL) 40 MG tablet Take 40 mg by mouth daily.     No facility-administered medications prior to visit.      Per HPI unless specifically indicated in ROS section below Review of Systems  Constitutional: Negative for activity change, appetite change, chills, fatigue, fever and unexpected weight change.  HENT: Negative for hearing loss.   Eyes: Negative for visual disturbance.  Respiratory: Positive for cough (COPD related) and shortness of breath (chronic). Negative for chest tightness and wheezing.   Cardiovascular: Negative for chest pain, palpitations and leg swelling.  Gastrointestinal: Positive for diarrhea. Negative for abdominal distention, abdominal pain, blood in stool, constipation, nausea and vomiting.  Genitourinary: Negative for difficulty urinating and hematuria.  Musculoskeletal: Negative for arthralgias, myalgias and neck pain.  Skin: Negative for rash.  Neurological: Negative for dizziness, seizures, syncope and headaches.  Hematological: Negative for adenopathy. Does not bruise/bleed easily.  Psychiatric/Behavioral: Negative for dysphoric mood. The patient is not nervous/anxious.    Objective:    BP 122/68 (BP Location: Left Arm, Patient Position: Sitting, Cuff Size: Normal)   Pulse (!) 102   Temp 98.2 F (36.8 C) (Temporal)   Ht 5' 8.5" (1.74 m)   Wt 159 lb 4 oz (72.2 kg)   SpO2 95%   BMI 23.86 kg/m   Wt Readings from Last 3 Encounters:  10/24/19 159 lb 4 oz (72.2 kg)  10/20/19 161 lb (73 kg)  10/08/19 161 lb (73 kg)     Physical Exam Vitals signs and nursing note reviewed.  Constitutional:      General: He is not in acute distress.    Appearance: Normal appearance. He is well-developed. He is not ill-appearing.  HENT:     Head: Normocephalic and atraumatic.     Right Ear: Hearing, tympanic membrane, ear canal and external ear normal.     Left Ear: Hearing, tympanic membrane, ear canal and external ear normal.     Nose: Nose normal.     Mouth/Throat:     Mouth: Mucous membranes are moist.     Pharynx: Oropharynx is clear. Uvula midline. No posterior oropharyngeal erythema.  Eyes:     General: No scleral icterus.    Extraocular Movements: Extraocular movements intact.     Conjunctiva/sclera: Conjunctivae normal.     Pupils: Pupils are equal,  round, and reactive to light.  Neck:     Musculoskeletal: Normal range of motion and neck supple.     Thyroid: No thyroid mass or thyromegaly.     Vascular: Carotid bruit (L>R) present.  Cardiovascular:     Rate and Rhythm: Normal rate and regular rhythm.     Pulses: Normal pulses.          Radial pulses are 2+ on the right side and 2+ on the left side.     Heart sounds: Murmur (harsh murmur heard along upper chest, known subclavian stenosis) present.  Pulmonary:     Effort: Pulmonary effort is normal. No respiratory distress.     Breath sounds: Wheezing (coarse throughout) present. No rhonchi or rales.  Abdominal:     General: Abdomen is flat. Bowel sounds are normal. There is no distension.     Palpations: Abdomen is soft. There is no mass.     Tenderness: There is no abdominal tenderness. There is no guarding or rebound.     Hernia: No hernia is present.  Musculoskeletal: Normal range of motion.     Right lower leg: No edema.     Left lower leg: No edema.  Lymphadenopathy:     Cervical: No cervical adenopathy.  Skin:    General: Skin is warm and dry.     Findings: No rash.  Neurological:     General: No focal deficit present.     Mental Status: He  is alert and oriented to person, place, and time.     Comments: CN grossly intact, station and gait intact  Psychiatric:        Mood and Affect: Mood normal.        Behavior: Behavior normal.        Thought Content: Thought content normal.        Judgment: Judgment normal.       Results for orders placed or performed in visit on 10/17/19  CBC with Differential  Result Value Ref Range   WBC 7.8 4.0 - 10.5 K/uL   RBC 4.30 4.22 - 5.81 Mil/uL   Hemoglobin 13.9 13.0 - 17.0 g/dL   HCT 41.6 39.0 - 52.0 %   MCV 96.9 78.0 - 100.0 fl   MCHC 33.3 30.0 - 36.0 g/dL   RDW 12.6 11.5 - 15.5 %   Platelets 235.0 150.0 - 400.0 K/uL   Neutrophils Relative % 52.1 43.0 - 77.0 %   Lymphocytes Relative 36.9 12.0 - 46.0 %   Monocytes Relative 8.6 3.0 - 12.0 %   Eosinophils Relative 2.0 0.0 - 5.0 %   Basophils Relative 0.4 0.0 - 3.0 %   Neutro Abs 4.0 1.4 - 7.7 K/uL   Lymphs Abs 2.9 0.7 - 4.0 K/uL   Monocytes Absolute 0.7 0.1 - 1.0 K/uL   Eosinophils Absolute 0.2 0.0 - 0.7 K/uL   Basophils Absolute 0.0 0.0 - 0.1 K/uL  vit d  Result Value Ref Range   VITD 40.52 30.00 - 100.00 ng/mL  Comprehensive metabolic panel  Result Value Ref Range   Sodium 144 135 - 145 mEq/L   Potassium 4.1 3.5 - 5.1 mEq/L   Chloride 103 96 - 112 mEq/L   CO2 28 19 - 32 mEq/L   Glucose, Bld 105 (H) 70 - 99 mg/dL   BUN 5 (L) 6 - 23 mg/dL   Creatinine, Ser 0.78 0.40 - 1.50 mg/dL   Total Bilirubin 0.6 0.2 - 1.2 mg/dL   Alkaline Phosphatase 69 39 -  117 U/L   AST 20 0 - 37 U/L   ALT 19 0 - 53 U/L   Total Protein 6.2 6.0 - 8.3 g/dL   Albumin 4.0 3.5 - 5.2 g/dL   GFR 95.92 >60.00 mL/min   Calcium 9.2 8.4 - 10.5 mg/dL  Lipid panel  Result Value Ref Range   Cholesterol 153 0 - 200 mg/dL   Triglycerides 80.0 0.0 - 149.0 mg/dL   HDL 91.00 >39.00 mg/dL   VLDL 16.0 0.0 - 40.0 mg/dL   LDL Cholesterol 46 0 - 99 mg/dL   Total CHOL/HDL Ratio 2    NonHDL 62.03   PSA  Result Value Ref Range   PSA 0.03 (L) 0.10 - 4.00 ng/mL    Assessment & Plan:  This visit occurred during the SARS-CoV-2 public health emergency.  Safety protocols were in place, including screening questions prior to the visit, additional usage of staff PPE, and extensive cleaning of exam room while observing appropriate contact time as indicated for disinfecting solutions.   Problem List Items Addressed This Visit    Vitamin D deficiency    Maintaining levels only on MVI.      Subclavian steal syndrome   Relevant Medications   atorvastatin (LIPITOR) 40 MG tablet   Other Relevant Orders   Ambulatory referral to Vascular Surgery   Smoker    Has restarted a few cig/day. Encouraged full cessation       PVD (peripheral vascular disease) with claudication ([place])    Return to [facility].       Relevant Medications   atorvastatin (LIPITOR) 40 MG tablet   Other Relevant Orders   Ambulatory referral to Vascular Surgery   Prostate cancer [facility])    Appreciate uro care. PSA appropriately low after seed implant [name] at [facility])      HTN (hypertension)    Chronic, stable off meds.       Relevant Medications   atorvastatin (LIPITOR) 40 MG tablet   Healthcare maintenance - Primary    Preventative protocols reviewed and updated unless pt declined. Discussed healthy diet and lifestyle.       Dyslipidemia    Chronic, stable. Continue statin. The 10-year ASCVD risk score [name], et al., 2013) is: 24%   Values used to calculate the score:     Age: 89 years     Sex: Male     Is Non-Hispanic African American: No     Diabetic: No     Tobacco smoker: Yes     Systolic Blood Pressure: 127 mmHg     Is BP treated: No     HDL Cholesterol: 91 mg/dL     Total Cholesterol: 153 mg/dL       Relevant Medications   atorvastatin (LIPITOR) 40 MG tablet   COPD, severe (HCC)    Chronic, deteriorated with restarting smoking (a few cig/day). Encouraged full cessation. Continue current regimen. Followed by [facility] pulm [name])      Relevant Medications    montelukast (SINGULAIR) 10 MG tablet   Carotid stenosis    Update carotid US and refer back to [facility].       Relevant Medications   atorvastatin (LIPITOR) 40 MG tablet   Other Relevant Orders   VAS US CAROTID   Ambulatory referral to Vascular Surgery   Bilateral hearing loss    Declines audiology eval. Previously saw Dr Redmond Baseman and audiology who recommended hearing aides.       Atherosclerosis of native arteries of extremity with intermittent  claudication [facility])    Will refer back to [facility] (last seen 2018). Continue aspirin, statin.       Relevant Medications   atorvastatin (LIPITOR) 40 MG tablet   Other Relevant Orders   Ambulatory referral to Vascular Surgery   Aorto-iliac atherosclerosis (HCC)    Continue aspirin, statin.       Relevant Medications   atorvastatin (LIPITOR) 40 MG tablet   Other Relevant Orders   Ambulatory referral to Vascular Surgery       Meds ordered this encounter  Medications  . montelukast (SINGULAIR) 10 MG tablet    Sig: Take 1 tablet (10 mg total) by mouth at bedtime.    Dispense:  90 tablet    Refill:  3  . atorvastatin (LIPITOR) 40 MG tablet    Sig: Take 1 tablet (40 mg total) by mouth daily.    Dispense:  90 tablet    Refill:  3   Orders Placed This Encounter  Procedures  . Ambulatory referral to Vascular Surgery    Referral Priority:   Routine    Referral Type:   Surgical    Referral Reason:   Specialty Services Required    Requested Specialty:   Vascular Surgery    Number of Visits Requested:   1   Patient instructions: If interested, check with pharmacy about new 2 shot shingles series (shingrix).  I will order carotid ultrasound for next month. We will refer you back to vascular doctor to follow along with [place].  Good to see you today. Return as needed or in 1 year for next physical. Look below at some dental clinic options.   Follow up plan: Return in about 1 year (around 10/23/2020) for medicare wellness visit.  Ria Bush,  MD

## 2019-10-24 NOTE — Assessment & Plan Note (Addendum)
Chronic, stable off meds.

## 2019-10-24 NOTE — Assessment & Plan Note (Signed)
Update carotid US and refer back to [facility].

## 2019-10-24 NOTE — Assessment & Plan Note (Signed)
Preventative protocols reviewed and updated unless pt declined. Discussed healthy diet and lifestyle.  

## 2019-10-24 NOTE — Assessment & Plan Note (Addendum)
Chronic, deteriorated with restarting smoking (a few cig/day). Encouraged full cessation. Continue current regimen. Followed by [facility] pulm [name])

## 2019-10-24 NOTE — Assessment & Plan Note (Signed)
Appreciate uro care. PSA appropriately low after seed implant Parker Martinez at [facility])

## 2019-10-24 NOTE — Assessment & Plan Note (Signed)
Continue aspirin, statin.  

## 2019-10-24 NOTE — Assessment & Plan Note (Signed)
Chronic, stable. Continue statin. The 10-year ASCVD risk score [name], et al., 2013) is: 24%   Values used to calculate the score:     Age: 78 years     Sex: Male     Is Non-Hispanic African American: No     Diabetic: No     Tobacco smoker: Yes     Systolic Blood Pressure: 182 mmHg     Is BP treated: No     HDL Cholesterol: 91 mg/dL     Total Cholesterol: 153 mg/dL

## 2019-10-24 NOTE — Assessment & Plan Note (Signed)
Declines audiology eval. Previously saw Dr Redmond Baseman and audiology who recommended hearing aides.

## 2019-10-24 NOTE — Assessment & Plan Note (Signed)
Maintaining levels only on MVI.

## 2019-10-24 NOTE — Assessment & Plan Note (Signed)
Will refer back to [facility] (last seen 2018). Continue aspirin, statin.

## 2019-11-10 ENCOUNTER — Other Ambulatory Visit: Payer: Self-pay

## 2019-11-10 ENCOUNTER — Ambulatory Visit (INDEPENDENT_AMBULATORY_CARE_PROVIDER_SITE_OTHER): Payer: PPO

## 2019-11-10 DIAGNOSIS — I6523 Occlusion and stenosis of bilateral carotid arteries: Secondary | ICD-10-CM

## 2019-11-19 ENCOUNTER — Other Ambulatory Visit: Payer: Self-pay | Admitting: Pulmonary Disease

## 2019-12-20 ENCOUNTER — Ambulatory Visit: Payer: Medicare Other | Attending: Internal Medicine

## 2019-12-20 DIAGNOSIS — Z23 Encounter for immunization: Secondary | ICD-10-CM | POA: Insufficient documentation

## 2019-12-20 NOTE — Progress Notes (Signed)
   Covid-19 Vaccination Clinic  Name:  Parker Martinez    MRN: [id number] DOB: 06/15/1941  12/20/2019  [name] was observed post Covid-19 immunization for 30 minutes based on pre-vaccination screening without incidence. He was provided with Vaccine Information Sheet and instruction to access the V-Safe system.   [name] was instructed to call 911 with any severe reactions post vaccine: Marland Kitchen Difficulty breathing  . Swelling of your face and throat  . A fast heartbeat  . A bad rash all over your body  . Dizziness and weakness    Immunizations Administered    Name Date Dose VIS Date Route   Pfizer COVID-19 Vaccine 12/20/2019 12:26 PM 0.3 mL 11/14/2019 Intramuscular   Manufacturer: [place]   Lot: [id number]   [place]: [id number]

## 2019-12-24 DIAGNOSIS — H2513 Age-related nuclear cataract, bilateral: Secondary | ICD-10-CM | POA: Diagnosis not present

## 2019-12-24 DIAGNOSIS — H353221 Exudative age-related macular degeneration, left eye, with active choroidal neovascularization: Secondary | ICD-10-CM | POA: Diagnosis not present

## 2020-01-05 DIAGNOSIS — J449 Chronic obstructive pulmonary disease, unspecified: Secondary | ICD-10-CM | POA: Diagnosis not present

## 2020-01-05 DIAGNOSIS — H2511 Age-related nuclear cataract, right eye: Secondary | ICD-10-CM | POA: Diagnosis not present

## 2020-01-10 ENCOUNTER — Ambulatory Visit: Payer: PPO | Attending: Internal Medicine

## 2020-01-10 DIAGNOSIS — Z23 Encounter for immunization: Secondary | ICD-10-CM | POA: Insufficient documentation

## 2020-01-10 NOTE — Progress Notes (Signed)
   Covid-19 Vaccination Clinic  Name:  Parker Martinez    MRN: [id number] DOB: [birth year]  01/10/2020  Mr. Parker Martinez was observed post Covid-19 immunization for 30 minutes based on pre-vaccination screening without incidence. He was provided with Vaccine Information Sheet and instruction to access the V-Safe system.   Mr. Parker Martinez was instructed to call 911 with any severe reactions post vaccine: Marland Kitchen Difficulty breathing  . Swelling of your face and throat  . A fast heartbeat  . A bad rash all over your body  . Dizziness and weakness    Immunizations Administered    Name Date Dose VIS Date Route   Pfizer COVID-19 Vaccine 01/10/2020 11:56 AM 0.3 mL 11/14/2019 Intramuscular   Manufacturer: [place]   Lot: [id number]   [place]: [id number]

## 2020-01-13 ENCOUNTER — Telehealth: Payer: Self-pay | Admitting: Family Medicine

## 2020-01-13 NOTE — Chronic Care Management (AMB) (Signed)
  Chronic Care Management   Note  01/13/2020 Name: Parker Martinez MRN: [id number] DOB: [birth year]  Parker Martinez is a 79 y.o. year old male who is a primary care patient of Ria Bush, MD. I reached out to [name] by phone today in response to a referral sent by Parker Martinez's PCP, Ria Bush, MD. Patient wife, [name] gave verbal consent for CCM program.  Parker Martinez was given information about Chronic Care Management services today including:  1. CCM service includes personalized support from designated clinical staff supervised by his physician, including individualized plan of care and coordination with other care providers 2. 24/7 contact phone numbers for assistance for urgent and routine care needs. 3. Service will only be billed when office clinical staff spend 20 minutes or more in a month to coordinate care. 4. Only one practitioner may furnish and bill the service in a calendar month. 5. The patient may stop CCM services at any time (effective at the end of the month) by phone call to the office staff. 6. The patient will be responsible for cost sharing (co-pay) of up to 20% of the service fee (after annual deductible is met).  Patient agreed to services and verbal consent obtained.   Follow up plan:   Raynicia Dukes [employer] Scheduler

## 2020-01-14 ENCOUNTER — Telehealth: Payer: Self-pay | Admitting: Pulmonary Disease

## 2020-01-14 MED ORDER — BUDESONIDE-FORMOTEROL FUMARATE 160-4.5 MCG/ACT IN AERO: 2.0000 | INHALATION_SPRAY | Freq: Two times a day (BID) | RESPIRATORY_TRACT | 2 refills | Status: DC

## 2020-01-14 NOTE — Telephone Encounter (Signed)
Rx for symbicort 160 has been sent to preferred pharmacy. Pt is aware and voiced his understanding.  Nothing further is needed.

## 2020-01-16 ENCOUNTER — Other Ambulatory Visit: Payer: Self-pay

## 2020-01-16 ENCOUNTER — Ambulatory Visit: Payer: PPO

## 2020-01-16 DIAGNOSIS — E785 Hyperlipidemia, unspecified: Secondary | ICD-10-CM

## 2020-01-16 DIAGNOSIS — M25512 Pain in left shoulder: Secondary | ICD-10-CM

## 2020-01-16 DIAGNOSIS — M5136 Other intervertebral disc degeneration, lumbar region: Secondary | ICD-10-CM

## 2020-01-16 DIAGNOSIS — J449 Chronic obstructive pulmonary disease, unspecified: Secondary | ICD-10-CM

## 2020-01-16 DIAGNOSIS — I10 Essential (primary) hypertension: Secondary | ICD-10-CM

## 2020-01-16 DIAGNOSIS — E559 Vitamin D deficiency, unspecified: Secondary | ICD-10-CM

## 2020-01-16 DIAGNOSIS — I739 Peripheral vascular disease, unspecified: Secondary | ICD-10-CM

## 2020-01-16 DIAGNOSIS — G8929 Other chronic pain: Secondary | ICD-10-CM

## 2020-01-16 DIAGNOSIS — K219 Gastro-esophageal reflux disease without esophagitis: Secondary | ICD-10-CM

## 2020-01-16 NOTE — Chronic Care Management (AMB) (Signed)
Chronic Care Management Pharmacy  Name: Parker Martinez  MRN: [id number] DOB: [birth year]  Chief Complaint/ HPI  [name],  79 y.o. , male presents for their Initial CCM visit with the clinical pharmacist via telephone.  PCP : Ria Bush, MD  Specialists:   Vernard Gambles, pulmonary   Tresa Endo, Urology  Redmond Baseman, Audiology  Tobe Sos, Ophthalmology - cataract surgery 01/26/20  Deitra Mayo, Vascular   Gussner, Gastroenterology   Vladimir Crofts, Neurology   Their chronic conditions include: hypertension, dyslipidemia, PVD, COPD, GERD, chronic pain, vitamin D deficiency    Patient concerns: vertigo symptoms for several years, unsteadiness, no falls; patient is aware several of his medications have a warning for drowsiness/dizziness and wondering if they may be contributing to these symptoms  We reviewed medication list, most likely contributors are Benadryl daily (in addition to Allegra daily) and alfuzosin. Patient reports some dizziness when he first stands up in the morning. Asked him to check his BP for orthostatic hypotension. Potentially some anticholinergic effects from ipratropium nasal spray in addition to his daily inhalers (although symptoms began prior to patient starting ipratropium) but mostly localized effect.  Plan: Patient to check his BP upon 2-5 minutes of standing, will follow up to assess; patient will also discuss switching to tamsulosin with his urologist, discontinue Benadryl, and limit alcohol intake.   Office Visits:  10/24/19: [name] - refer back to [facility] for PAD, recommend Shingrix  10/20/19: [name] - no medication changes  Consult Visit:  09/29/19: [name], Urology - no medication changes, continue alfuzosin  Allergies  Allergen Reactions  . Flonase [Fluticasone Propionate] Other (See Comments)    Per pt, causes rebound effect     Medications: Outpatient Encounter Medications as of 01/16/2020  Medication Sig  . albuterol  (PROAIR HFA) 108 (90 Base) MCG/ACT inhaler USE 2 PUFFS EVERY 6 HOURS AS NEEDED FOR WHEEZING  . alfuzosin (UROXATRAL) 10 MG 24 hr tablet Take 10 mg by mouth daily with breakfast.  . aspirin 81 MG tablet Take 81 mg by mouth every morning.   Marland Kitchen atorvastatin (LIPITOR) 40 MG tablet Take 1 tablet (40 mg total) by mouth daily.  . budesonide-formoterol (SYMBICORT) 160-4.5 MCG/ACT inhaler Inhale 2 puffs into the lungs 2 (two) times daily.  . cyclobenzaprine (FLEXERIL) 10 MG tablet TAKE 1 TABLET BY MOUTH TWICE A DAY AS NEEDED FOR MUSCLE SPASMS  . fexofenadine (ALLEGRA) 180 MG tablet Take 180 mg by mouth daily.  Marland Kitchen HYDROcodone-acetaminophen (NORCO/VICODIN) 5-325 MG tablet Take 1 tablet by mouth every 6 (six) hours as needed for moderate pain.  Marland Kitchen ipratropium (ATROVENT) 0.06 % nasal spray Place 2 sprays into both nostrils 4 (four) times daily as needed for rhinitis.  Marland Kitchen montelukast (SINGULAIR) 10 MG tablet Take 1 tablet (10 mg total) by mouth at bedtime.  . Multiple Vitamin (MULTIVITAMIN) tablet Take 1 tablet by mouth daily.  . pantoprazole (PROTONIX) 40 MG tablet Take 1 tablet (40 mg total) by mouth 2 (two) times daily before a meal. 30 mins before breakfast and supper  . SPIRIVA RESPIMAT 2.5 MCG/ACT AERS INHALE 2 PUFFS BY MOUTH INTO THE LUNGS DAILY  . vitamin B-12 (CYANOCOBALAMIN) 500 MCG tablet Take 500 mcg by mouth every morning.    No facility-administered encounter medications on file as of 01/16/2020.    Current Diagnosis/Assessment: Goals    . Increase physical activity     Starting 10/15/2018, I will continue to take medications as prescribed.     . Patient Stated  10/20/2019, I will maintain and continue medications as prescribed.     . Pharmacy Care Plan     Current Barriers:  . Chronic Disease Management support, education, and care coordination needs related to  hypertension, dyslipidemia, peripheral vascular disease, COPD, GERD, chronic pain, vitamin D deficiency  Pharmacist Clinical  Goal(s):  [name] patient concerns about dizziness and instability. Recommend discontinuing Benadryl daily and keep on hand for allergic reactions. Also recommend self-checking blood pressure while lying down and again within 2-5 minutes of standing. Keep log of blood pressure changes. Patient to discuss alternatives to alfuzosin with his urologist, including tamsulosin (Flomax) to reduce side effects of dizziness and unsteadiness. . Recommend complete tobacco cessation. . Recommend 2 doses of Shingrix vaccine from local pharmacy.  Interventions: . Comprehensive medication review performed. Marland Kitchen Updated medication list for accuracy  Patient Self Care Activities:  . Self administers medications as prescribed . Self monitors blood pressure   Initial goal documentation       COPD / Tobacco  Managed by pulmonology  Last spirometry score: 05/2016 48% Exacerbations requiring hospitalization: last 05/28/18, none in past year Symptoms: very short of breath walking from house to car, going up and down steps  Gold Grade: Gold 3 (FEV1 30-49%) Current COPD Classification:  B (high sx, <2 exacerbations/yr)  Eosinophil count:   Lab Results  Component Value Date/Time   EOSPCT 2.0 10/17/2019 10:24 AM  %                               Eos (Absolute):  Lab Results  Component Value Date/Time   EOSABS 0.2 10/17/2019 10:24 AM   Current use: 3-4 cigarettes/day (started in 2020 after pandemic)  Tobacco Status Social History   Tobacco Use  Smoking Status Current Every Day Smoker  . Packs/day: 0.50  . Years: 60.00  . Pack years: 30.00  . Types: Cigarettes  Smokeless Tobacco Never Used  Tobacco Comment   4-5 cigs per day 10/20/2019   Patient has failed these meds in past: Dulera (unknown) Patient is currently controlled on the following medications:   Albuterol - 2 puffs every 6 hours as needed  Symbicort 160-4.5 mcg/act - Inhale 2 puffs twice daily (AM, PM)   Spiriva 2.5 mcg/act - Inhale  2 puffs once daily (noon) Using maintenance inhaler regularly? Yes Frequency of rescue inhaler use:  multiple times per day (3-5x/day)  We discussed: patient reports starting maintenance inhalers 2-3 years ago, noticed breathing a little better, able to move a little bit more, but not a major change. He has discussed next treatment options with pulmonology of albuterol nebulizer in the morning prior to considering oxygen.  Plan: Continue current medications; Encourage smoking cessation. ,  Hypertension   Office blood pressures are: BP Readings from Last 3 Encounters:  10/24/19 122/68  10/20/19 124/68  10/08/19 124/68   CMP Latest Ref Rng & Units 10/17/2019 02/20/2019 10/15/2018  Glucose 70 - 99 mg/dL 105(H) 112(H) 113(H)  BUN 6 - 23 mg/dL 5(L) 5(L) 6  Creatinine 0.40 - 1.50 mg/dL 0.78 0.77 0.84  Sodium 135 - 145 mEq/L 144 138 142  Potassium 3.5 - 5.1 mEq/L 4.1 3.9 5.0  Chloride 96 - 112 mEq/L 103 104 103  CO2 19 - 32 mEq/L 28 26 30   Calcium 8.4 - 10.5 mg/dL 9.2 9.7 10.0  Total Protein 6.0 - 8.3 g/dL 6.2 - 6.6  Total Bilirubin 0.2 - 1.2 mg/dL 0.6 -  0.7  Alkaline Phos 39 - 117 U/L 69 - 75  AST 0 - 37 U/L 20 - 19  ALT 0 - 53 U/L 19 - 17   Patient has failed these meds in the past: none Patient checks BP at home infrequently Patient home BP readings are ranging: none reported Patient is currently controlled on the following medications:   No pharmacotherapy  We discussed: patient has home BP monitor, check for orthostatic hypotension  Plan: Continue control with diet and exercise; Follow up call in 1 week to assess for hypotension.  Hyperlipidemia/PVD   CBC Latest Ref Rng & Units 10/17/2019 02/20/2019 10/15/2018  WBC 4.0 - 10.5 K/uL 7.8 7.3 7.5  Hemoglobin 13.0 - 17.0 g/dL 13.9 12.7(L) 14.3  Hematocrit 39.0 - 52.0 % 41.6 39.9 42.2  Platelets 150.0 - 400.0 K/uL 235.0 239 237.0   Lipid Panel     Component Value Date/Time   CHOL 153 10/17/2019 1024   CHOL 183 10/19/2010  0000   TRIG 80.0 10/17/2019 1024   TRIG 111 10/19/2010 0000   HDL 91.00 10/17/2019 1024   CHOLHDL 2 10/17/2019 1024   VLDL 16.0 10/17/2019 1024   LDLCALC 46 10/17/2019 1024   LDLDIRECT 58.0 10/09/2017 1232  LDL goal < 100  Patient has failed these meds in past: none Patient is currently controlled on the following medications:   Atorvastatin 40 mg - 1 tablet daily  Aspirin 81 mg - 1 tablet daily We discussed: aspirin indicated for PVD  Plan: Continue current medications  No diagnosis: Allergies  No current specialists Symptoms: 3 years ago last episode History: 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, after 5-6 episodes); unable to identify any specific allergen, thought maybe autoimmune Patient has failed these meds in past: flonase (rebound symptoms) Patient is currently controlled on the following medications:  Fexofenadine 180 mg - 1 tablet daily  Benadryl 25 mg - 1 tablet every morning   Ipratropium 0.06% nasal spray - 2 sprays in both nostril four times daily as needed (started a couple months ago, uses 2-3 times day)  Montelukast 10 mg - 1 tablet at bedtime  Epi-pen 0.3mg /86mL - as needed for anaphylaxis   Plan: Continue current medications; Recommend discontinuing OTC Benadryl daily. Keep on hand for acute allergic reactions.  GERD  Followed by gastroenterology, [name] Patient has failed these meds in past: none, has history of GI stretching Patient is currently controlled on the following medications:  Pantoprazole 40 mg - 1 tablet twice daily We discussed: no problems recently, increased to twice daily 3 years ago   Plan: Continue current medications  Chronic Pain  Location: back problem, surgery 2 years ago Vladimir Crofts, neuro) Symptom control: no pain since surgery, other than occasional Tylenol Patient has failed these meds in past: none Patient is currently controlled on the following medications:   Cyclobenzaprine 10 mg - twice daily  as needed (takes rarely)  Hydrocodone-acetaminophen 5-325 mg - 1 tablet every 6 hours as needed (not taking, hasn't refilled in 1-2 years)  Plan: Continue current medications; Remove hydrocodone-APAP from medication list, patient hasn't taken in > 1 year.  Prostate Cancer/ Urinary Urgency  Managed by Urology, [name]  Patient has failed these meds in past: none  Patient is currently controlled on the following medications:   Alfuzosin 10 mg - 1 tablet with breakfast  We discussed: started medication prior to prostate surgery, 5-6 years ago; patient reports potential side effects of dizziness, unsteadiness Symptoms: still has urgency at times, not  too bothersome, primary symptom is incomplete voiding We discussed: consider tamsulosin as alternative due to less side effects of dizziness, orthostasis - patient will discuss with urologist at next visit  Plan: Continue current medications; Discuss alternatives at next urology visit.  Medication Management  OTCs: multivitamin (with vitamin D, vitamin D WNL), Vitamin B12 500 mcg - once daily (09/16/15 B12 WNL)  Pharmacy: [facility], regular vials  Adherence: pillbox   Affordability: no concerns, goes into donut hole each year but income above PAP limits  Vaccines: PCV13, PPSV23, COVID-19, influenza - up to date, Tdap (patient reports up to date, we do not have records of this), Shingrix - recommended, pt reports hasn't made it to the pharmacy yet, but plans on it; Ask patient to provide Td or Tdap records at next visit.  CCM Follow Up: Wednesday, July 07, 2020 10:00 AM  Debbora Dus, PharmD Clinical Pharmacist [facility] at [facility] [phone]

## 2020-01-16 NOTE — Patient Instructions (Addendum)
January 16, 2020  Dear Parker Martinez,  It was a pleasure meeting you during our initial appointment on January 16, 2020. Below is a summary of the goals we discussed and components of chronic care management. Please contact me anytime with questions or concerns.   Visit Information  Goals Addressed            This Visit's Progress   . COMPLETED: Increase physical activity   On track    Starting 10/15/2018, I will continue to take medications as prescribed.     . Patient Stated   On track    10/20/2019, I will maintain and continue medications as prescribed.     . Pharmacy Care Plan       Current Barriers:  . Chronic Disease Management support, education, and care coordination needs related to  hypertension, dyslipidemia, peripheral vascular disease, COPD, GERD, chronic pain, vitamin D deficiency  Pharmacist Clinical Goal(s):  Parker Martinez patient concerns about dizziness and instability. Recommend discontinuing Benadryl daily and keep on hand for allergic reactions. Also recommend self-checking blood pressure while lying down and again within 2-5 minutes of standing. Keep log of blood pressure changes. Patient to discuss alternatives to alfuzosin with his urologist, including tamsulosin (Flomax) to reduce side effects of dizziness and unsteadiness. . Recommend complete tobacco cessation. . Recommend 2 doses of Shingrix vaccine from local pharmacy.  Interventions: . Comprehensive medication review performed. Marland Kitchen Updated medication list for accuracy  Patient Self Care Activities:  . Self administers medications as prescribed . Self monitors blood pressure   Initial goal documentation       Parker Martinez was given information about Chronic Care Management services today including:  1. CCM service includes personalized support from designated clinical staff supervised by his physician, including individualized plan of care and coordination with other care providers 2. 24/7  contact phone numbers for assistance for urgent and routine care needs. 3. Service will only be billed when office clinical staff spend 20 minutes or more in a month to coordinate care. 4. Only one practitioner may furnish and bill the service in a calendar month. 5. The patient may stop CCM services at any time (effective at the end of the month) by phone call to the office staff. 6. The patient will be responsible for cost sharing (co-pay) of up to 20% of the service fee (after annual deductible is met).  Patient agreed to services and verbal consent obtained.   Telephone follow up appointment with pharmacy team member scheduled for:  Wednesday, August 4, 10:00 AM  Debbora Dus, PharmD Clinical Pharmacist [facility] at [facility] [phone]   Orthostatic Hypotension Blood pressure is a measurement of how strongly, or weakly, your blood is pressing against the walls of your arteries. Orthostatic hypotension is a sudden drop in blood pressure that happens when you quickly change positions, such as when you get up from sitting or lying down. Arteries are blood vessels that carry blood from your heart throughout your body. When blood pressure is too low, you may not get enough blood to your brain or to the rest of your organs. This can cause weakness, light-headedness, rapid heartbeat, and fainting. This can last for just a few seconds or for up to a few minutes. Orthostatic hypotension is usually not a serious problem. However, if it happens frequently or gets worse, it may be a sign of something more serious. What are the causes? This condition may be caused by:  Sudden changes in  posture, such as standing up quickly after you have been sitting or lying down.  Blood loss.  Loss of body fluids (dehydration).  Heart problems.  Hormone (endocrine) problems.  Pregnancy.  Severe infection.  Lack of certain nutrients.  Severe allergic reactions  (anaphylaxis).  Certain medicines, such as blood pressure medicine or medicines that make the body lose excess fluids (diuretics). Sometimes, this condition can be caused by not taking medicine as directed, such as taking too much of a certain medicine. What increases the risk? The following factors may make you more likely to develop this condition:  Age. Risk increases as you get older.  Conditions that affect the heart or the central nervous system.  Taking certain medicines, such as blood pressure medicine or diuretics.  Being pregnant. What are the signs or symptoms? Symptoms of this condition may include:  Weakness.  Light-headedness.  Dizziness.  Blurred vision.  Fatigue.  Rapid heartbeat.  Fainting, in severe cases. How is this diagnosed? This condition is diagnosed based on:  Your medical history.  Your symptoms.  Your blood pressure measurement. Your health care provider will check your blood pressure when you are: ? Lying down. ? Sitting. ? Standing. A blood pressure reading is recorded as two numbers, such as "120 over 80" (or 120/80). The first ("top") number is called the systolic pressure. It is a measure of the pressure in your arteries as your heart beats. The second ("bottom") number is called the diastolic pressure. It is a measure of the pressure in your arteries when your heart relaxes between beats. Blood pressure is measured in a unit called mm Hg. Healthy blood pressure for most adults is 120/80. If your blood pressure is below 90/60, you may be diagnosed with hypotension. Other information or tests that may be used to diagnose orthostatic hypotension include:  Your other vital signs, such as your heart rate and temperature.  Blood tests.  Tilt table test. For this test, you will be safely secured to a table that moves you from a lying position to an upright position. Your heart rhythm and blood pressure will be monitored during the test. How is  this treated? This condition may be treated by:  Changing your diet. This may involve eating more salt (sodium) or drinking more water.  Taking medicines to raise your blood pressure.  Changing the dosage of certain medicines you are taking that might be lowering your blood pressure.  Wearing compression stockings. These stockings help to prevent blood clots and reduce swelling in your legs. In some cases, you may need to go to the hospital for:  Fluid replacement. This means you will receive fluids through an IV.  Blood replacement. This means you will receive donated blood through an IV (transfusion).  Treating an infection or heart problems, if this applies.  Monitoring. You may need to be monitored while medicines that you are taking wear off. Follow these instructions at home: Eating and drinking   Drink enough fluid to keep your urine pale yellow.  Eat a healthy diet, and follow instructions from your health care provider about eating or drinking restrictions. A healthy diet includes: ? Fresh fruits and vegetables. ? Whole grains. ? Lean meats. ? Low-fat dairy products.  Eat extra salt only as directed. Do not add extra salt to your diet unless your health care provider told you to do that.  Eat frequent, small meals.  Avoid standing up suddenly after eating. Medicines  Take over-the-counter and prescription medicines only  as told by your health care provider. ? Follow instructions from your health care provider about changing the dosage of your current medicines, if this applies. ? Do not stop or adjust any of your medicines on your own. General instructions   Wear compression stockings as told by your health care provider.  Get up slowly from lying down or sitting positions. This gives your blood pressure a chance to adjust.  Avoid hot showers and excessive heat as directed by your health care provider.  Return to your normal activities as told by your health  care provider. Ask your health care provider what activities are safe for you.  Do not use any products that contain nicotine or tobacco, such as cigarettes, e-cigarettes, and chewing tobacco. If you need help quitting, ask your health care provider.  Keep all follow-up visits as told by your health care provider. This is important. Contact a health care provider if you:  Vomit.  Have diarrhea.  Have a fever for more than 2-3 days.  Feel more thirsty than usual.  Feel weak and tired. Get help right away if you:  Have chest pain.  Have a fast or irregular heartbeat.  Develop numbness in any part of your body.  Cannot move your arms or your legs.  Have trouble speaking.  Become sweaty or feel light-headed.  Faint.  Feel short of breath.  Have trouble staying awake.  Feel confused. Summary  Orthostatic hypotension is a sudden drop in blood pressure that happens when you quickly change positions.  Orthostatic hypotension is usually not a serious problem.  It is diagnosed by having your blood pressure taken lying down, sitting, and then standing.  It may be treated by changing your diet or adjusting your medicines. This information is not intended to replace advice given to you by your health care provider. Make sure you discuss any questions you have with your health care provider. Document Revised: 05/16/2018 Document Reviewed: 05/16/2018 Elsevier Patient Education  [place].

## 2020-01-19 ENCOUNTER — Telehealth: Payer: Self-pay

## 2020-01-19 ENCOUNTER — Encounter: Payer: Self-pay | Admitting: Ophthalmology

## 2020-01-19 DIAGNOSIS — I10 Essential (primary) hypertension: Secondary | ICD-10-CM

## 2020-01-19 DIAGNOSIS — H43813 Vitreous degeneration, bilateral: Secondary | ICD-10-CM | POA: Diagnosis not present

## 2020-01-19 DIAGNOSIS — H2513 Age-related nuclear cataract, bilateral: Secondary | ICD-10-CM | POA: Diagnosis not present

## 2020-01-19 DIAGNOSIS — J449 Chronic obstructive pulmonary disease, unspecified: Secondary | ICD-10-CM

## 2020-01-19 NOTE — Telephone Encounter (Signed)
I would like to request a referral for Parker Martinez to chronic care management pharmacy services focusing on the following conditions:   Essential hypertension, benign  [I10]  COPD, severe ([place]) [J44.9]  Debbora Dus, PharmD Clinical Pharmacist [facility] at [facility] [phone]

## 2020-01-21 ENCOUNTER — Other Ambulatory Visit
Admission: RE | Admit: 2020-01-21 | Discharge: 2020-01-21 | Disposition: A | Discharge status: Home / Self Care | Payer: PPO | Source: Ambulatory Visit | Attending: Ophthalmology | Admitting: Ophthalmology

## 2020-01-21 DIAGNOSIS — Z01812 Encounter for preprocedural laboratory examination: Secondary | ICD-10-CM | POA: Diagnosis not present

## 2020-01-21 DIAGNOSIS — Z20822 Contact with and (suspected) exposure to covid-19: Secondary | ICD-10-CM | POA: Diagnosis not present

## 2020-01-22 LAB — SARS CORONAVIRUS 2 (TAT 6-24 HRS): SARS Coronavirus 2: NEGATIVE

## 2020-01-22 NOTE — Discharge Instructions (Signed)

## 2020-01-23 ENCOUNTER — Other Ambulatory Visit: Payer: PPO

## 2020-01-23 NOTE — Anesthesia Preprocedure Evaluation (Addendum)
Anesthesia Evaluation  Patient identified by MRN, date of birth, ID band Patient awake    Reviewed: Allergy & Precautions, NPO status , Patient's Chart, lab work & pertinent test results  History of Anesthesia Complications Negative for: history of anesthetic complications  Airway Mallampati: III  TM Distance: >3 FB Neck ROM: Full    Dental  (+) Upper Dentures, Lower Dentures   Pulmonary COPD, Current Smoker and Patient abstained from smoking.,    breath sounds clear to auscultation       Cardiovascular hypertension, (-) angina+ Peripheral Vascular Disease (L subclavian steal syndrome, carotid stenosis)  (-) DOE  Rhythm:Regular Rate:Normal     Neuro/Psych  Neuromuscular disease (Lumbar radiculopathy)    GI/Hepatic GERD  Controlled, Esophageal stricture s/p dilation Diverticulosis   Endo/Other    Renal/GU Renal disease (Stones)     Musculoskeletal  (+) Arthritis ,   Abdominal   Peds  Hematology   Anesthesia Other Findings Prostate cancer H/o idiopathic anaphylaxis  Reproductive/Obstetrics                            Anesthesia Physical Anesthesia Plan  ASA: III  Anesthesia Plan: MAC   Post-op Pain Management:    Induction: Intravenous  PONV Risk Score and Plan: 1 and TIVA, Midazolam and Treatment may vary due to age or medical condition  Airway Management Planned: Nasal Cannula  Additional Equipment:   Intra-op Plan:   Post-operative Plan:   Informed Consent: I have reviewed the patients History and Physical, chart, labs and discussed the procedure including the risks, benefits and alternatives for the proposed anesthesia with the patient or authorized representative who has indicated his/her understanding and acceptance.       Plan Discussed with: CRNA and Anesthesiologist  Anesthesia Plan Comments:         Anesthesia Quick Evaluation

## 2020-01-26 ENCOUNTER — Ambulatory Visit: Payer: PPO | Admitting: Anesthesiology

## 2020-01-26 ENCOUNTER — Other Ambulatory Visit: Payer: Self-pay

## 2020-01-26 ENCOUNTER — Encounter
Admission: RE | Disposition: A | Discharge status: Home / Self Care | Payer: Self-pay | Source: Home / Self Care | Attending: Ophthalmology

## 2020-01-26 ENCOUNTER — Ambulatory Visit
Admission: RE | Admit: 2020-01-26 | Discharge: 2020-01-26 | Disposition: A | Discharge status: Home / Self Care | Payer: PPO | Attending: Ophthalmology | Admitting: Ophthalmology

## 2020-01-26 ENCOUNTER — Encounter: Payer: Self-pay | Admitting: Ophthalmology

## 2020-01-26 DIAGNOSIS — E7800 Pure hypercholesterolemia, unspecified: Secondary | ICD-10-CM | POA: Diagnosis not present

## 2020-01-26 DIAGNOSIS — R42 Dizziness and giddiness: Secondary | ICD-10-CM | POA: Diagnosis not present

## 2020-01-26 DIAGNOSIS — J449 Chronic obstructive pulmonary disease, unspecified: Secondary | ICD-10-CM | POA: Insufficient documentation

## 2020-01-26 DIAGNOSIS — K5790 Diverticulosis of intestine, part unspecified, without perforation or abscess without bleeding: Secondary | ICD-10-CM | POA: Diagnosis not present

## 2020-01-26 DIAGNOSIS — M1990 Unspecified osteoarthritis, unspecified site: Secondary | ICD-10-CM | POA: Diagnosis not present

## 2020-01-26 DIAGNOSIS — G709 Myoneural disorder, unspecified: Secondary | ICD-10-CM | POA: Insufficient documentation

## 2020-01-26 DIAGNOSIS — M5416 Radiculopathy, lumbar region: Secondary | ICD-10-CM | POA: Diagnosis not present

## 2020-01-26 DIAGNOSIS — I6522 Occlusion and stenosis of left carotid artery: Secondary | ICD-10-CM | POA: Diagnosis not present

## 2020-01-26 DIAGNOSIS — F17200 Nicotine dependence, unspecified, uncomplicated: Secondary | ICD-10-CM | POA: Insufficient documentation

## 2020-01-26 DIAGNOSIS — I739 Peripheral vascular disease, unspecified: Secondary | ICD-10-CM | POA: Insufficient documentation

## 2020-01-26 DIAGNOSIS — H2511 Age-related nuclear cataract, right eye: Secondary | ICD-10-CM | POA: Diagnosis not present

## 2020-01-26 DIAGNOSIS — Z8546 Personal history of malignant neoplasm of prostate: Secondary | ICD-10-CM | POA: Diagnosis not present

## 2020-01-26 DIAGNOSIS — R05 Cough: Secondary | ICD-10-CM | POA: Insufficient documentation

## 2020-01-26 DIAGNOSIS — K219 Gastro-esophageal reflux disease without esophagitis: Secondary | ICD-10-CM | POA: Diagnosis not present

## 2020-01-26 DIAGNOSIS — Z87442 Personal history of urinary calculi: Secondary | ICD-10-CM | POA: Insufficient documentation

## 2020-01-26 DIAGNOSIS — I10 Essential (primary) hypertension: Secondary | ICD-10-CM | POA: Diagnosis not present

## 2020-01-26 DIAGNOSIS — H25811 Combined forms of age-related cataract, right eye: Secondary | ICD-10-CM | POA: Diagnosis not present

## 2020-01-26 HISTORY — PX: CATARACT EXTRACTION W/PHACO: SHX586

## 2020-01-26 SURGERY — PHACOEMULSIFICATION, CATARACT, WITH IOL INSERTION
Anesthesia: Monitor Anesthesia Care | Site: Eye | Laterality: Right

## 2020-01-26 MED ORDER — MOXIFLOXACIN HCL 0.5 % OP SOLN
OPHTHALMIC | Status: DC | PRN
  Administered 2020-01-26: 0.2 mL via OPHTHALMIC

## 2020-01-26 MED ORDER — MIDAZOLAM HCL 2 MG/2ML IJ SOLN
INTRAMUSCULAR | Status: DC | PRN
  Administered 2020-01-26: 1 mg via INTRAVENOUS

## 2020-01-26 MED ORDER — ARMC OPHTHALMIC DILATING DROPS
1.0000 "application " | OPHTHALMIC | Status: DC | PRN
  Administered 2020-01-26 (×3): 1 via OPHTHALMIC

## 2020-01-26 MED ORDER — EPINEPHRINE PF 1 MG/ML IJ SOLN
INTRAOCULAR | Status: DC | PRN
  Administered 2020-01-26: 104 mL via OPHTHALMIC

## 2020-01-26 MED ORDER — TETRACAINE HCL 0.5 % OP SOLN
1.0000 [drp] | OPHTHALMIC | Status: DC | PRN
  Administered 2020-01-26 (×3): 1 [drp] via OPHTHALMIC

## 2020-01-26 MED ORDER — SODIUM HYALURONATE 23 MG/ML IO SOLN
INTRAOCULAR | Status: DC | PRN
  Administered 2020-01-26: 0.6 mL via INTRAOCULAR

## 2020-01-26 MED ORDER — SODIUM HYALURONATE 10 MG/ML IO SOLN
INTRAOCULAR | Status: DC | PRN
  Administered 2020-01-26: 0.55 mL via INTRAOCULAR

## 2020-01-26 MED ORDER — LACTATED RINGERS IV SOLN: 100.0000 mL/h | INTRAVENOUS | Status: DC

## 2020-01-26 MED ORDER — LIDOCAINE HCL (PF) 2 % IJ SOLN
INTRAOCULAR | Status: DC | PRN
  Administered 2020-01-26: 10:00:00 1 mL via INTRAOCULAR

## 2020-01-26 MED ORDER — FENTANYL CITRATE (PF) 100 MCG/2ML IJ SOLN
INTRAMUSCULAR | Status: DC | PRN
  Administered 2020-01-26: 50 ug via INTRAVENOUS

## 2020-01-26 SURGICAL SUPPLY — 19 items
CANNULA ANT/CHMB 27G (MISCELLANEOUS) ×2 IMPLANT
CANNULA ANT/CHMB 27GA (MISCELLANEOUS) ×4 IMPLANT
DISSECTOR HYDRO NUCLEUS 50X22 (MISCELLANEOUS) ×2 IMPLANT
GLOVE SURG LX 7.5 STRW (GLOVE) ×1
GLOVE SURG LX STRL 7.5 STRW (GLOVE) ×1 IMPLANT
GLOVE SURG SYN 8.5  E (GLOVE) ×1
GLOVE SURG SYN 8.5 E (GLOVE) ×1 IMPLANT
GLOVE SURG SYN 8.5 PF PI (GLOVE) ×1 IMPLANT
GOWN STRL REUS W/ TWL LRG LVL3 (GOWN DISPOSABLE) ×2 IMPLANT
GOWN STRL REUS W/TWL LRG LVL3 (GOWN DISPOSABLE) ×2
LENS IOL TECNIS ITEC 16.5 (Intraocular Lens) ×1 IMPLANT
MARKER SKIN DUAL TIP RULER LAB (MISCELLANEOUS) ×2 IMPLANT
PACK DR. KING ARMS (PACKS) ×2 IMPLANT
PACK EYE AFTER SURG (MISCELLANEOUS) ×2 IMPLANT
PACK OPTHALMIC (MISCELLANEOUS) ×2 IMPLANT
SYR 3ML LL SCALE MARK (SYRINGE) ×2 IMPLANT
SYR TB 1ML LUER SLIP (SYRINGE) ×2 IMPLANT
WATER STERILE IRR 250ML POUR (IV SOLUTION) ×2 IMPLANT
WIPE NON LINTING 3.25X3.25 (MISCELLANEOUS) ×2 IMPLANT

## 2020-01-26 NOTE — Anesthesia Postprocedure Evaluation (Signed)
Anesthesia Post Note  Patient: Parker Martinez  Procedure(s) Performed: CATARACT EXTRACTION PHACO AND INTRAOCULAR LENS PLACEMENT (IOC) RIGHT 5.15  00:42.9 (Right Eye)     Patient location during evaluation: PACU Anesthesia Type: MAC Level of consciousness: awake and alert Pain management: pain level controlled Vital Signs Assessment: post-procedure vital signs reviewed and stable Respiratory status: spontaneous breathing, nonlabored ventilation, respiratory function stable and patient connected to nasal cannula oxygen Cardiovascular status: stable and blood pressure returned to baseline Postop Assessment: no apparent nausea or vomiting Anesthetic complications: no    Ziah Turvey A  Swathi Dauphin

## 2020-01-26 NOTE — Anesthesia Procedure Notes (Signed)
Procedure Name: MAC Date/Time: 01/26/2020 9:54 AM Performed by: Cameron Ali, CRNA Pre-anesthesia Checklist: Patient identified, Emergency Drugs available, Suction available, Timeout performed and Patient being monitored Patient Re-evaluated:Patient Re-evaluated prior to induction Oxygen Delivery Method: Nasal cannula Placement Confirmation: positive ETCO2

## 2020-01-26 NOTE — Op Note (Signed)
OPERATIVE NOTE  Parker Martinez [id number] 01/26/2020   PREOPERATIVE DIAGNOSIS:  Nuclear sclerotic cataract right eye.  H25.11   POSTOPERATIVE DIAGNOSIS:    Nuclear sclerotic cataract right eye.     PROCEDURE:  Phacoemusification with posterior chamber intraocular lens placement of the right eye   LENS:   Implant Name Type Inv. Item Serial No. Manufacturer Lot No. LRB No. Used Action  LENS IOL DIOP 16.5 - [id number] Intraocular Lens LENS IOL DIOP 16.5 [id number] AMO  Right 1 Implanted       Procedure(s): CATARACT EXTRACTION PHACO AND INTRAOCULAR LENS PLACEMENT (IOC) RIGHT 5.15  00:42.9 (Right)  PCB00 +16.5   ULTRASOUND TIME: 0 minutes 42 seconds.  CDE 5.15   SURGEON:  Benay Pillow, MD, MPH  ANESTHESIOLOGIST: Anesthesiologist: Heniser, Fredric Dine, MD CRNA: Cameron Ali, CRNA   ANESTHESIA:  Topical with tetracaine drops augmented with 1% preservative-free intracameral lidocaine.  ESTIMATED BLOOD LOSS: less than 1 mL.   COMPLICATIONS:  None.   DESCRIPTION OF PROCEDURE:  The patient was identified in the holding room and transported to the operating room and placed in the supine position under the operating microscope.  The right eye was identified as the operative eye and it was prepped and draped in the usual sterile ophthalmic fashion.   A 1.0 millimeter clear-corneal paracentesis was made at the 10:30 position. 0.5 ml of preservative-free 1% lidocaine with epinephrine was injected into the anterior chamber.  The anterior chamber was filled with Healon 5 viscoelastic.  A 2.4 millimeter keratome was used to make a near-clear corneal incision at the 8:00 position.  A curvilinear capsulorrhexis was made with a cystotome and capsulorrhexis forceps.  Balanced salt solution was used to hydrodissect and hydrodelineate the nucleus.   Phacoemulsification was then used in stop and chop fashion to remove the lens nucleus and epinucleus.  The remaining cortex was then removed using the  irrigation and aspiration handpiece. Healon was then placed into the capsular bag to distend it for lens placement.  A lens was then injected into the capsular bag.  The remaining viscoelastic was aspirated.   Wounds were hydrated with balanced salt solution.  The anterior chamber was inflated to a physiologic pressure with balanced salt solution.   Intracameral vigamox 0.1 mL undiluted was injected into the eye and a drop placed onto the ocular surface.  No wound leaks were noted.  The patient was taken to the recovery room in stable condition without complications of anesthesia or surgery  Benay Pillow 01/26/2020, 10:21 AM

## 2020-01-26 NOTE — H&P (Signed)

## 2020-01-26 NOTE — Transfer of Care (Signed)
Immediate Anesthesia Transfer of Care Note  Patient: Parker Martinez  Procedure(s) Performed: CATARACT EXTRACTION PHACO AND INTRAOCULAR LENS PLACEMENT (IOC) RIGHT 5.15  00:42.9 (Right Eye)  Patient Location: PACU  Anesthesia Type: MAC  Level of Consciousness: awake, alert  and patient cooperative  Airway and Oxygen Therapy: Patient Spontanous Breathing and Patient connected to supplemental oxygen  Post-op Assessment: Post-op Vital signs reviewed, Patient's Cardiovascular Status Stable, Respiratory Function Stable, Patent Airway and No signs of Nausea or vomiting  Post-op Vital Signs: Reviewed and stable  Complications: No apparent anesthesia complications

## 2020-01-27 ENCOUNTER — Encounter: Payer: Self-pay | Admitting: *Deleted

## 2020-02-05 ENCOUNTER — Other Ambulatory Visit: Payer: Self-pay

## 2020-02-05 ENCOUNTER — Encounter: Payer: Self-pay | Admitting: Ophthalmology

## 2020-02-05 DIAGNOSIS — H2512 Age-related nuclear cataract, left eye: Secondary | ICD-10-CM | POA: Diagnosis not present

## 2020-02-05 DIAGNOSIS — E7800 Pure hypercholesterolemia, unspecified: Secondary | ICD-10-CM | POA: Diagnosis not present

## 2020-02-12 ENCOUNTER — Other Ambulatory Visit
Admission: RE | Admit: 2020-02-12 | Discharge: 2020-02-12 | Disposition: A | Discharge status: Home / Self Care | Payer: PPO | Source: Ambulatory Visit | Attending: Ophthalmology | Admitting: Ophthalmology

## 2020-02-12 ENCOUNTER — Other Ambulatory Visit: Payer: Self-pay

## 2020-02-12 DIAGNOSIS — Z20822 Contact with and (suspected) exposure to covid-19: Secondary | ICD-10-CM | POA: Diagnosis not present

## 2020-02-12 LAB — SARS CORONAVIRUS 2 (TAT 6-24 HRS): SARS Coronavirus 2: NEGATIVE

## 2020-02-12 NOTE — Discharge Instructions (Signed)

## 2020-02-16 ENCOUNTER — Ambulatory Visit: Payer: PPO | Admitting: Anesthesiology

## 2020-02-16 ENCOUNTER — Ambulatory Visit
Admission: RE | Admit: 2020-02-16 | Discharge: 2020-02-16 | Disposition: A | Discharge status: Home / Self Care | Payer: PPO | Attending: Ophthalmology | Admitting: Ophthalmology

## 2020-02-16 ENCOUNTER — Encounter
Admission: RE | Disposition: A | Discharge status: Home / Self Care | Payer: Self-pay | Source: Home / Self Care | Attending: Ophthalmology

## 2020-02-16 DIAGNOSIS — E7800 Pure hypercholesterolemia, unspecified: Secondary | ICD-10-CM | POA: Diagnosis not present

## 2020-02-16 DIAGNOSIS — Z87892 Personal history of anaphylaxis: Secondary | ICD-10-CM | POA: Insufficient documentation

## 2020-02-16 DIAGNOSIS — Z8546 Personal history of malignant neoplasm of prostate: Secondary | ICD-10-CM | POA: Diagnosis not present

## 2020-02-16 DIAGNOSIS — I739 Peripheral vascular disease, unspecified: Secondary | ICD-10-CM | POA: Diagnosis not present

## 2020-02-16 DIAGNOSIS — M1990 Unspecified osteoarthritis, unspecified site: Secondary | ICD-10-CM | POA: Diagnosis not present

## 2020-02-16 DIAGNOSIS — Z7982 Long term (current) use of aspirin: Secondary | ICD-10-CM | POA: Insufficient documentation

## 2020-02-16 DIAGNOSIS — H2512 Age-related nuclear cataract, left eye: Secondary | ICD-10-CM | POA: Diagnosis not present

## 2020-02-16 DIAGNOSIS — Z79899 Other long term (current) drug therapy: Secondary | ICD-10-CM | POA: Diagnosis not present

## 2020-02-16 DIAGNOSIS — J449 Chronic obstructive pulmonary disease, unspecified: Secondary | ICD-10-CM | POA: Diagnosis not present

## 2020-02-16 DIAGNOSIS — Z9841 Cataract extraction status, right eye: Secondary | ICD-10-CM | POA: Insufficient documentation

## 2020-02-16 DIAGNOSIS — I10 Essential (primary) hypertension: Secondary | ICD-10-CM | POA: Insufficient documentation

## 2020-02-16 DIAGNOSIS — H25812 Combined forms of age-related cataract, left eye: Secondary | ICD-10-CM | POA: Diagnosis not present

## 2020-02-16 DIAGNOSIS — F17200 Nicotine dependence, unspecified, uncomplicated: Secondary | ICD-10-CM | POA: Insufficient documentation

## 2020-02-16 HISTORY — PX: CATARACT EXTRACTION W/PHACO: SHX586

## 2020-02-16 SURGERY — PHACOEMULSIFICATION, CATARACT, WITH IOL INSERTION
Anesthesia: Monitor Anesthesia Care | Site: Eye | Laterality: Left

## 2020-02-16 MED ORDER — SODIUM HYALURONATE 10 MG/ML IO SOLN
INTRAOCULAR | Status: DC | PRN
  Administered 2020-02-16: 0.55 mL via INTRAOCULAR

## 2020-02-16 MED ORDER — EPINEPHRINE PF 1 MG/ML IJ SOLN
INTRAOCULAR | Status: DC | PRN
  Administered 2020-02-16: 131 mL via OPHTHALMIC

## 2020-02-16 MED ORDER — LACTATED RINGERS IV SOLN: INTRAVENOUS | Status: DC

## 2020-02-16 MED ORDER — FENTANYL CITRATE (PF) 100 MCG/2ML IJ SOLN
INTRAMUSCULAR | Status: DC | PRN
  Administered 2020-02-16: 50 ug via INTRAVENOUS

## 2020-02-16 MED ORDER — SODIUM HYALURONATE 23 MG/ML IO SOLN
INTRAOCULAR | Status: DC | PRN
  Administered 2020-02-16: 0.6 mL via INTRAOCULAR

## 2020-02-16 MED ORDER — MOXIFLOXACIN HCL 0.5 % OP SOLN
OPHTHALMIC | Status: DC | PRN
  Administered 2020-02-16: 0.2 mL via OPHTHALMIC

## 2020-02-16 MED ORDER — ARMC OPHTHALMIC DILATING DROPS
1.0000 "application " | OPHTHALMIC | Status: DC | PRN
  Administered 2020-02-16 (×3): 1 via OPHTHALMIC

## 2020-02-16 MED ORDER — TETRACAINE HCL 0.5 % OP SOLN
1.0000 [drp] | OPHTHALMIC | Status: DC | PRN
  Administered 2020-02-16 (×2): 1 [drp] via OPHTHALMIC

## 2020-02-16 MED ORDER — LIDOCAINE HCL (PF) 2 % IJ SOLN
INTRAOCULAR | Status: DC | PRN
  Administered 2020-02-16: 1 mL via INTRAOCULAR

## 2020-02-16 MED ORDER — MIDAZOLAM HCL 2 MG/2ML IJ SOLN
INTRAMUSCULAR | Status: DC | PRN
  Administered 2020-02-16: 1 mg via INTRAVENOUS

## 2020-02-16 SURGICAL SUPPLY — 19 items
CANNULA ANT/CHMB 27G (MISCELLANEOUS) ×2 IMPLANT
CANNULA ANT/CHMB 27GA (MISCELLANEOUS) ×4 IMPLANT
DISSECTOR HYDRO NUCLEUS 50X22 (MISCELLANEOUS) ×2 IMPLANT
GLOVE SURG LX 7.5 STRW (GLOVE) ×1
GLOVE SURG LX STRL 7.5 STRW (GLOVE) ×1 IMPLANT
GLOVE SURG SYN 8.5  E (GLOVE) ×2
GLOVE SURG SYN 8.5 E (GLOVE) ×1 IMPLANT
GLOVE SURG SYN 8.5 PF PI (GLOVE) ×1 IMPLANT
GOWN STRL REUS W/ TWL LRG LVL3 (GOWN DISPOSABLE) ×2 IMPLANT
GOWN STRL REUS W/TWL LRG LVL3 (GOWN DISPOSABLE) ×4
LENS IOL TECNIS ITEC 18.0 (Intraocular Lens) ×1 IMPLANT
MARKER SKIN DUAL TIP RULER LAB (MISCELLANEOUS) ×2 IMPLANT
PACK DR. KING ARMS (PACKS) ×2 IMPLANT
PACK EYE AFTER SURG (MISCELLANEOUS) ×2 IMPLANT
PACK OPTHALMIC (MISCELLANEOUS) ×2 IMPLANT
SYR 3ML LL SCALE MARK (SYRINGE) ×2 IMPLANT
SYR TB 1ML LUER SLIP (SYRINGE) ×2 IMPLANT
WATER STERILE IRR 250ML POUR (IV SOLUTION) ×2 IMPLANT
WIPE NON LINTING 3.25X3.25 (MISCELLANEOUS) ×2 IMPLANT

## 2020-02-16 NOTE — Anesthesia Procedure Notes (Signed)
Procedure Name: MAC Date/Time: 02/16/2020 11:24 AM Performed by: Vanetta Shawl, CRNA Pre-anesthesia Checklist: Patient identified, Emergency Drugs available, Suction available, Timeout performed and Patient being monitored Patient Re-evaluated:Patient Re-evaluated prior to induction Oxygen Delivery Method: Nasal cannula Placement Confirmation: positive ETCO2

## 2020-02-16 NOTE — Transfer of Care (Signed)
Immediate Anesthesia Transfer of Care Note  Patient: Parker Martinez  Procedure(s) Performed: CATARACT EXTRACTION PHACO AND INTRAOCULAR LENS PLACEMENT (IOC) LEFT 3.87  00:45.4 (Left Eye)  Patient Location: PACU  Anesthesia Type: MAC  Level of Consciousness: awake, alert  and patient cooperative  Airway and Oxygen Therapy: Patient Spontanous Breathing   Post-op Assessment: Post-op Vital signs reviewed, Patient's Cardiovascular Status Stable, Respiratory Function Stable, Patent Airway and No signs of Nausea or vomiting  Post-op Vital Signs: Reviewed and stable  Complications: No apparent anesthesia complications

## 2020-02-16 NOTE — Anesthesia Postprocedure Evaluation (Signed)
Anesthesia Post Note  Patient: Parker Martinez  Procedure(s) Performed: CATARACT EXTRACTION PHACO AND INTRAOCULAR LENS PLACEMENT (IOC) LEFT 3.87  00:45.4 (Left Eye)     Patient location during evaluation: PACU Anesthesia Type: MAC Level of consciousness: awake and alert Pain management: pain level controlled Vital Signs Assessment: post-procedure vital signs reviewed and stable Respiratory status: spontaneous breathing Cardiovascular status: blood pressure returned to baseline Postop Assessment: no apparent nausea or vomiting, adequate PO intake and no headache Anesthetic complications: no    Jamorion Gomillion M Lama Narayanan      

## 2020-02-16 NOTE — H&P (Signed)

## 2020-02-16 NOTE — Anesthesia Preprocedure Evaluation (Addendum)
Anesthesia Evaluation  Patient identified by MRN, date of birth, ID band Patient awake    History of Anesthesia Complications Negative for: history of anesthetic complications (idiopathic anaphylaxis none x4 years)  Airway Mallampati: II  TM Distance: >3 FB Neck ROM: Full    Dental  (+) Edentulous Upper, Edentulous Lower   Pulmonary COPD, Current Smoker and Patient abstained from smoking.,    Pulmonary exam normal        Cardiovascular Exercise Tolerance: Poor hypertension, + Peripheral Vascular Disease  Normal cardiovascular exam  2019 echo  Study Conclusions   - Technically difficult study due to chest wall and/or lung  interference.  - Left ventricle: Systolic function was normal. The estimated  ejection fraction was in the range of 60% to 65%. Doppler  parameters are consistent with abnormal left ventricular  relaxation (grade 1 diastolic dysfunction).  - Mitral valve: Calcified annulus.  - Right ventricle: The cavity size was normal. Wall thickness was  normal. Systolic function was normal.   Reported hx of subclavian steal syndrome per chart   Neuro/Psych negative neurological ROS     GI/Hepatic Neg liver ROS, GERD  Controlled and Medicated,  Endo/Other  negative endocrine ROS  Renal/GU negative Renal ROS     Musculoskeletal   Abdominal   Peds  Hematology negative hematology ROS (+)   Anesthesia Other Findings   Reproductive/Obstetrics                            Anesthesia Physical Anesthesia Plan  ASA: III  Anesthesia Plan: MAC   Post-op Pain Management:    Induction: Intravenous  PONV Risk Score and Plan: 0 and Midazolam and Treatment may vary due to age or medical condition  Airway Management Planned: Nasal Cannula and Natural Airway  Additional Equipment: None  Intra-op Plan:   Post-operative Plan:   Informed Consent: I have reviewed the patients  History and Physical, chart, labs and discussed the procedure including the risks, benefits and alternatives for the proposed anesthesia with the patient or authorized representative who has indicated his/her understanding and acceptance.       Plan Discussed with: CRNA  Anesthesia Plan Comments:         Anesthesia Quick Evaluation

## 2020-02-16 NOTE — Anesthesia Postprocedure Evaluation (Signed)
Anesthesia Post Note  Patient: Parker Martinez  Procedure(s) Performed: CATARACT EXTRACTION PHACO AND INTRAOCULAR LENS PLACEMENT (IOC) LEFT 3.87  00:45.4 (Left Eye)     Patient location during evaluation: PACU Anesthesia Type: MAC Level of consciousness: awake and alert Pain management: pain level controlled Vital Signs Assessment: post-procedure vital signs reviewed and stable Respiratory status: spontaneous breathing Cardiovascular status: blood pressure returned to baseline Postop Assessment: no apparent nausea or vomiting, adequate PO intake and no headache Anesthetic complications: no    Adele Barthel Jeneva Schweizer

## 2020-02-16 NOTE — Op Note (Signed)
OPERATIVE NOTE  Parker Martinez [id number] 02/16/2020   PREOPERATIVE DIAGNOSIS:  Nuclear sclerotic cataract left eye.  H25.12   POSTOPERATIVE DIAGNOSIS:    Nuclear sclerotic cataract left eye.     PROCEDURE:  Phacoemusification with posterior chamber intraocular lens placement of the left eye   LENS:   Implant Name Type Inv. Item Serial No. Manufacturer Lot No. LRB No. Used Action  LENS IOL DIOP 18.0 - [id number] Intraocular Lens LENS IOL DIOP 18.0 [id number] AMO  Left 1 Implanted      Procedure(s): CATARACT EXTRACTION PHACO AND INTRAOCULAR LENS PLACEMENT (IOC) LEFT 3.87  00:45.4 (Left)  PCB00 +18.0   ULTRASOUND TIME: 0 minutes 45 seconds.  CDE 3.87   SURGEON:  Benay Pillow, MD, MPH   ANESTHESIA:  Topical with tetracaine drops augmented with 1% preservative-free intracameral lidocaine.  ESTIMATED BLOOD LOSS: <1 mL   COMPLICATIONS:  None.   DESCRIPTION OF PROCEDURE:  The patient was identified in the holding room and transported to the operating room and placed in the supine position under the operating microscope.  The left eye was identified as the operative eye and it was prepped and draped in the usual sterile ophthalmic fashion.   A 1.0 millimeter clear-corneal paracentesis was made at the 5:00 position. 0.5 ml of preservative-free 1% lidocaine with epinephrine was injected into the anterior chamber.  The anterior chamber was filled with Healon 5 viscoelastic.  A 2.4 millimeter keratome was used to make a near-clear corneal incision at the 2:00 position.  A curvilinear capsulorrhexis was made with a cystotome and capsulorrhexis forceps.  Balanced salt solution was used to hydrodissect and hydrodelineate the nucleus.   Phacoemulsification was then used in stop and chop fashion to remove the lens nucleus and epinucleus.  The remaining cortex was then removed using the irrigation and aspiration handpiece. Healon was then placed into the capsular bag to distend it for lens  placement.  A lens was then injected into the capsular bag.  The remaining viscoelastic was aspirated.   Wounds were hydrated with balanced salt solution.  The anterior chamber was inflated to a physiologic pressure with balanced salt solution.  Intracameral vigamox 0.1 mL undiltued was injected into the eye and a drop placed onto the ocular surface.  No wound leaks were noted.  The patient was taken to the recovery room in stable condition without complications of anesthesia or surgery  Benay Pillow 02/16/2020, 11:49 AM

## 2020-02-17 ENCOUNTER — Encounter: Payer: Self-pay | Admitting: *Deleted

## 2020-03-19 ENCOUNTER — Other Ambulatory Visit: Payer: Self-pay | Admitting: Internal Medicine

## 2020-03-29 DIAGNOSIS — R3915 Urgency of urination: Secondary | ICD-10-CM | POA: Diagnosis not present

## 2020-03-29 DIAGNOSIS — R350 Frequency of micturition: Secondary | ICD-10-CM | POA: Diagnosis not present

## 2020-03-29 DIAGNOSIS — C61 Malignant neoplasm of prostate: Secondary | ICD-10-CM | POA: Diagnosis not present

## 2020-04-03 ENCOUNTER — Other Ambulatory Visit: Payer: Self-pay | Admitting: Pulmonary Disease

## 2020-04-29 ENCOUNTER — Encounter: Payer: Self-pay | Admitting: Pulmonary Disease

## 2020-04-29 ENCOUNTER — Ambulatory Visit (INDEPENDENT_AMBULATORY_CARE_PROVIDER_SITE_OTHER): Payer: PPO | Admitting: Pulmonary Disease

## 2020-04-29 VITALS — BP 140/60 | HR 79 | Temp 98.3°F | Ht 69.0 in | Wt 155.0 lb

## 2020-04-29 DIAGNOSIS — F17210 Nicotine dependence, cigarettes, uncomplicated: Secondary | ICD-10-CM

## 2020-04-29 DIAGNOSIS — R0609 Other forms of dyspnea: Secondary | ICD-10-CM

## 2020-04-29 DIAGNOSIS — J300 Vasomotor rhinitis: Secondary | ICD-10-CM | POA: Diagnosis not present

## 2020-04-29 DIAGNOSIS — R0600 Dyspnea, unspecified: Secondary | ICD-10-CM | POA: Diagnosis not present

## 2020-04-29 DIAGNOSIS — J441 Chronic obstructive pulmonary disease with (acute) exacerbation: Secondary | ICD-10-CM | POA: Diagnosis not present

## 2020-04-29 MED ORDER — PREDNISONE 10 MG (21) PO TBPK
ORAL_TABLET | ORAL | 0 refills | Status: DC
Start: 2020-04-29 — End: 2020-06-17

## 2020-04-29 MED ORDER — BREZTRI AEROSPHERE 160-9-4.8 MCG/ACT IN AERO: 2.0000 | INHALATION_SPRAY | Freq: Two times a day (BID) | RESPIRATORY_TRACT | 0 refills | Status: DC

## 2020-04-29 NOTE — Progress Notes (Addendum)
Subjective:    Patient ID: Parker Martinez, male    DOB: [birth year], 79 y.o.   MRN: [id number] PULMONARY OFFICE FOLLOW-UP NOTE  Requesting MD/Service:Gutierrez Date of initial consultation:05/06/18 by Dr. Merton Border Reason for consultation:Severe COPD, smoker  PT PROFILE: 79 y.o.maleformersmoker (previously > 1 PPD,with diagnosis of COPD rendered several years ago and with progressive DOE over past 5-7 years  DATA: 10/19/10 Spirometry:Severe obstruction (FEV1 41% predicted) 05/28/18 PFTs: FVC: 2.13 >2.96 L (58 >80 %pred), FEV1: 0.85 >1.04 L (30 >36 %pred), FEV1/FVC: 40 >35%, TLC: 7.90 L (128 %pred), DLCO 50 %pred. Flow volume curve consistent with severe obstruction 02/20/19 PFTs: FEV1 was 1.96 L or 48% predicted, FVC was 2.72 L or 95% predicted, FEV1/FVC was 38%.  TLC was 131%, RV 165%, indicating hyperinflation and air trapping.  Diffusion capacity was 48%.  Findings consistent with severe obstruction and emphysema.  INTERVAL: Last visit  10/08/2019 with me. Unfortunately continues to smoke.  States breathing is getting worse   HPI 79 year old current smoker who presents for follow-up on the issue of COPD.  This is a scheduled appointment.  Unfortunately he has resumed the habit of smoking and is smoking at least a half a pack of cigarettes per day.  He states that his dyspnea has worsened.  In the past his dyspnea had improved when he quit smoking.  He has very poor insight as to cause and effect.  We did try to educate him in this issue.  He is using his albuterol inhaler approximately 20 times per week.  Cough and character of sputum have not changed from baseline.  No hemoptysis.  No chest pain.  Has chronic orthopnea, no paroxysmal nocturnal dyspnea.  No lower extremity edema.  He is on Symbicort and Spiriva as well as as needed albuterol which as noted above he is using approximately 20 times per week.  He is on Atrovent nasal for vasomotor rhinitis.  Review of  Systems A 10 point review of systems was performed and it is as noted above otherwise negative.    Objective:   Physical Exam BP 140/60 (BP Location: Left Arm, Cuff Size: Normal)   Pulse 79   Temp 98.3 F (36.8 C) (Temporal)   Ht 5\' 9"  (1.753 m)   Wt 155 lb (70.3 kg)   SpO2 95%   BMI 22.89 kg/m   GENERAL: Disheveled appearing elderly male, no acute distress.  Use of accessories, chronic.  Presents in transport chair (due to dyspnea). HEAD: Normocephalic, atraumatic.  EYES: Pupils equal, round, reactive to light.  No scleral icterus.  MOUTH: Nose/mouth/throat not examined due to masking requirements for COVID 19. NECK: Supple. No thyromegaly. Trachea midline. No JVD.  No adenopathy. PULMONARY: Distant breath sounds, wheezes noted throughout.  I to E ratio 1:3 CARDIOVASCULAR: S1 and S2.    Regular rate rate with regular rhythm.    Grade 1/6 to 2/6 systolic ejection murmur left sternal border. GASTROINTESTINAL: Benign. MUSCULOSKELETAL: No joint deformity, no clubbing, no edema.  NEUROLOGIC: No overt focal deficit noted.  Speech is fluent. SKIN: Intact,warm,dry. PSYCH: Mood and behavior are normal.  Ambulatory oximetry today failed to show evidence of oxygen desaturation.  Saturations never below 97%.    Assessment & Plan:     ICD-10-CM   1. Dyspnea on exertion  R06.00 ECHOCARDIOGRAM COMPLETE    DG Chest 2 View    Pulse oximetry, overnight   Due to severe COPD Aggravated by ongoing tobacco use Overnight oximetry 2D echo to exclude cardiac  etiology  2. COPD exacerbation (HCC)  J44.1    Obtain chest x-ray Prednisone taper Change maintenance inhaler to Breztri No evidence of need of antibiotics  3. Vasomotor rhinitis  J30.0    Continue Atrovent nasal  4. Tobacco dependence due to cigarettes  F17.210    Patient counseled regards to discontinuation of smoking Total counseling time 5 minutes   Meds ordered this encounter  Medications  . Budeson-Glycopyrrol-Formoterol  (BREZTRI AEROSPHERE) 160-9-4.8 MCG/ACT AERO    Sig: Inhale 2 puffs into the lungs 2 (two) times daily.    Dispense:  5.9 g    Refill:  0    Order Specific Question:   Lot Number?    Answer:   [id number]    Order Specific Question:   Expiration Date?    Answer:   08/04/2021    Order Specific Question:   Quantity    Answer:   2  . DISCONTD: predniSONE (STERAPRED UNI-PAK 21 TAB) 10 MG (21) TBPK tablet    Sig: Take as directed in the pack    Dispense:  21 tablet    Refill:  0   Orders Placed This Encounter  Procedures  . DG Chest 2 View    Standing Status:   Future    Number of Occurrences:   1    Standing Expiration Date:   04/29/2021    Order Specific Question:   Reason for Exam (SYMPTOM  OR DIAGNOSIS REQUIRED)    Answer:   dyspnea    Order Specific Question:   Preferred imaging location?    Answer:   [facility]    Order Specific Question:   Radiology Contrast Protocol - do NOT remove file path    Answer:   [email or web address]  . Pulse oximetry, overnight    Standing Status:   Future    Standing Expiration Date:   04/29/2021    Scheduling Instructions:     ONO on RA please,      Not est with any DME yet  . ECHOCARDIOGRAM COMPLETE    Standing Status:   Future    Number of Occurrences:   1    Standing Expiration Date:   04/29/2021    Order Specific Question:   Where should this test be performed    Answer:    [facility]    Order Specific Question:   Perflutren DEFINITY (image enhancing agent) should be administered unless hypersensitivity or allergy exist    Answer:   Administer Perflutren    Order Specific Question:   Is a special reader required? (athlete or structural heart)    Answer:   No    Order Specific Question:   Does this study need to be read by the Structural team/Level 3 readers?    Answer:   No    Order Specific Question:   Reason for exam-Echo    Answer:   Dyspnea  786.09 / R06.00   Discussion:  Patient  appears to have a mild COPD exacerbation as evidenced by wheezing noted today.  We will give him a trial of prednisone taper.  No indication for antibiotics at this point.  Ambulatory oximetry did not exhibit oxygen desaturations.  We will check overnight oximetry.  We will also check echocardiogram to evaluate mild murmur heard today as well as potential for cor pulmonale.  These issues may aggravate his sensation of dyspnea.  Ultimately he understands that he needs to discontinue smoking it appears that previously he had done better with regards to  his dyspnea when he had discontinued smoking he has poor insight as to cause and effect.  Follow-up will be in 4 to 6 weeks time he is to contact us prior to that time should any new difficulties arise.  Renold Don, MD  PCCM  *This note was dictated using voice recognition software/Dragon.  Despite best efforts to proofread, errors can occur which can change the meaning.  Any change was purely unintentional.

## 2020-04-29 NOTE — Patient Instructions (Signed)
You were noted significant wheezing today.  Your oxygen level remained good even when walking.  We are going to check oxygen level during sleep.  Given you a trial of Breztri this will be 2 puffs twice a day.  Rinse your mouth well after you use it.  Please let us know how this does for you so that we can call the prescription to your pharmacy.  DO NOT USE Symbicort or Spiriva while you are using the Breztri.  We are scheduling a heart echocardiogram to evaluate your heart murmur and also the pressure going from the heart to the lungs.  We will see you in follow-up in 4 to 6 weeks time.

## 2020-05-03 ENCOUNTER — Other Ambulatory Visit: Payer: Self-pay | Admitting: Family Medicine

## 2020-05-04 ENCOUNTER — Other Ambulatory Visit: Payer: Self-pay | Admitting: Pulmonary Disease

## 2020-05-05 ENCOUNTER — Encounter: Payer: Self-pay | Admitting: Pulmonary Disease

## 2020-05-07 ENCOUNTER — Other Ambulatory Visit: Payer: Self-pay

## 2020-05-07 DIAGNOSIS — R0600 Dyspnea, unspecified: Secondary | ICD-10-CM

## 2020-05-07 MED ORDER — BREZTRI AEROSPHERE 160-9-4.8 MCG/ACT IN AERO
2.0000 | INHALATION_SPRAY | Freq: Two times a day (BID) | RESPIRATORY_TRACT | 3 refills | Status: DC
Start: 2020-05-07 — End: 2020-06-17

## 2020-05-07 NOTE — Progress Notes (Signed)
ONO reveled that patient needs 2 liters at night. Order has been placed as well as order for split night. Patient is aware of results. Patient also stated that [name] is working well for him and that he has noticed a difference and would like a prescription sent to pharmacy. Verified pharmacy. RX sent. Will route to Dr. [name] as an Juluis Rainier.Nothing further needed at this time.

## 2020-05-09 ENCOUNTER — Other Ambulatory Visit: Payer: Self-pay | Admitting: Pulmonary Disease

## 2020-05-10 ENCOUNTER — Ambulatory Visit: Payer: PPO

## 2020-05-14 ENCOUNTER — Telehealth: Payer: Self-pay | Admitting: Pulmonary Disease

## 2020-05-14 NOTE — Telephone Encounter (Signed)
Parker Martinez, can you help with this. Order was placed on 05/07/2020

## 2020-05-14 NOTE — Telephone Encounter (Signed)
Per Suanne Marker verbally order has not been signed by provider, therefore he can not be sent to DME.   Lm to make pt aware.

## 2020-05-17 DIAGNOSIS — H353132 Nonexudative age-related macular degeneration, bilateral, intermediate dry stage: Secondary | ICD-10-CM | POA: Diagnosis not present

## 2020-05-18 DIAGNOSIS — R0600 Dyspnea, unspecified: Secondary | ICD-10-CM | POA: Diagnosis not present

## 2020-05-18 NOTE — Telephone Encounter (Signed)
Per Suanne Marker- order has been signed and sent [facility].  Pt is aware of this information and voiced his understanding. Nothing further is needed.

## 2020-05-24 MED ORDER — IPRATROPIUM BROMIDE 0.06 % NA SOLN: 2.0000 | Freq: Four times a day (QID) | NASAL | 0 refills | Status: DC | PRN

## 2020-05-24 NOTE — Addendum Note (Signed)
Addended by: Stephanie Coup on: 05/24/2020 10:08 AM   Modules accepted: Orders

## 2020-06-01 ENCOUNTER — Ambulatory Visit
Admission: RE | Admit: 2020-06-01 | Discharge: 2020-06-01 | Disposition: A | Discharge status: Home / Self Care | Payer: PPO | Source: Ambulatory Visit | Attending: Pulmonary Disease | Admitting: Pulmonary Disease

## 2020-06-01 ENCOUNTER — Other Ambulatory Visit: Payer: Self-pay

## 2020-06-01 DIAGNOSIS — J449 Chronic obstructive pulmonary disease, unspecified: Secondary | ICD-10-CM | POA: Insufficient documentation

## 2020-06-01 DIAGNOSIS — R0600 Dyspnea, unspecified: Secondary | ICD-10-CM | POA: Diagnosis not present

## 2020-06-01 DIAGNOSIS — I10 Essential (primary) hypertension: Secondary | ICD-10-CM | POA: Diagnosis not present

## 2020-06-01 DIAGNOSIS — R0609 Other forms of dyspnea: Secondary | ICD-10-CM

## 2020-06-01 NOTE — Progress Notes (Signed)
*  PRELIMINARY RESULTS* Echocardiogram 2D Echocardiogram has been performed.  Parker Martinez 06/01/2020, 11:40 AM

## 2020-06-10 ENCOUNTER — Other Ambulatory Visit: Payer: Self-pay | Admitting: Internal Medicine

## 2020-06-17 ENCOUNTER — Ambulatory Visit
Admission: RE | Admit: 2020-06-17 | Discharge: 2020-06-17 | Disposition: A | Discharge status: Home / Self Care | Payer: PPO | Source: Ambulatory Visit | Attending: Pulmonary Disease | Admitting: Pulmonary Disease

## 2020-06-17 ENCOUNTER — Encounter: Payer: Self-pay | Admitting: Pulmonary Disease

## 2020-06-17 ENCOUNTER — Ambulatory Visit: Payer: PPO | Admitting: Pulmonary Disease

## 2020-06-17 ENCOUNTER — Other Ambulatory Visit: Payer: Self-pay

## 2020-06-17 ENCOUNTER — Ambulatory Visit
Admission: RE | Admit: 2020-06-17 | Discharge: 2020-06-17 | Disposition: A | Discharge status: Home / Self Care | Payer: PPO | Attending: Pulmonary Disease | Admitting: Pulmonary Disease

## 2020-06-17 VITALS — BP 130/60 | HR 117 | Temp 98.1°F | Ht 69.0 in | Wt 153.4 lb

## 2020-06-17 DIAGNOSIS — J439 Emphysema, unspecified: Secondary | ICD-10-CM

## 2020-06-17 DIAGNOSIS — J449 Chronic obstructive pulmonary disease, unspecified: Secondary | ICD-10-CM

## 2020-06-17 DIAGNOSIS — R0609 Other forms of dyspnea: Secondary | ICD-10-CM

## 2020-06-17 DIAGNOSIS — R000 Tachycardia, unspecified: Secondary | ICD-10-CM

## 2020-06-17 DIAGNOSIS — R0600 Dyspnea, unspecified: Secondary | ICD-10-CM | POA: Insufficient documentation

## 2020-06-17 DIAGNOSIS — F17210 Nicotine dependence, cigarettes, uncomplicated: Secondary | ICD-10-CM

## 2020-06-17 DIAGNOSIS — G4736 Sleep related hypoventilation in conditions classified elsewhere: Secondary | ICD-10-CM

## 2020-06-17 MED ORDER — BREZTRI AEROSPHERE 160-9-4.8 MCG/ACT IN AERO: 2.0000 | INHALATION_SPRAY | Freq: Two times a day (BID) | RESPIRATORY_TRACT | 0 refills | Status: DC

## 2020-06-17 NOTE — Progress Notes (Signed)
Subjective:    Patient ID: Parker Martinez, male    DOB: 09/02/1941, 79 y.o.   MRN: [id number] PULMONARY OFFICE FOLLOW-UP NOTE  Requesting MD/Service:Gutierrez Date of initial consultation:06/03/19by Dr. Merton Border Reason for consultation:Severe COPD, smoker  PT PROFILE: 78y.o.maleformersmoker (previously > 1 PPD,with diagnosis of COPD rendered several years ago and with progressive DOE over past 5-7 years  DATA: 10/19/10 Spirometry:Severe obstruction (FEV1 41% predicted) 05/28/18 PFTs: FVC: 2.13 >2.96 L (58 >80 %pred), FEV1: 0.85 >1.04 L (30 >36 %pred), FEV1/FVC: 40 >35%, TLC: 7.90 L (128 %pred), DLCO 50 %pred. Flow volume curve consistent with severe obstruction 02/20/19 PFTs:FEV1 was 1.96 L or 48% predicted, FVC was 2.72 L or 95% predicted, FEV1/FVC was 38%. TLC was 131%, RV 165%, indicating hyperinflation and air trapping. Diffusion capacity was 48%. Findings consistent with severe obstruction and emphysema. 05/05/20 Overnight oximetry: Shows nocturnal desaturations and possible obstructive sleep apnea.  Patient will need sleep study 06/01/20 2D echo: LVEF 60 to 65%, no evidence of cor pulmonale, mild aortic sclerosis, diastolic dysfunction type I  INTERVAL: Last visit05/27/2021 with me. Unfortunately continues to smoke.  No new issues.   HPI Patient presents for routine follow-up.  79 year old with severe COPD, current smoker, no new complaint since his last visit.  Does note that [name] helps him with his breathing.  Continues to smoke a pack of cigarettes per day.  Also engages in alcohol consumption.  Counseled with regards to these 2 issues.  His overnight oximetry performed prior to this visit showed significant nocturnal hypoxemia and he is now on nocturnal oxygen and compliant with it.  Overnight oximetry was also suggestive of potential sleep disordered breathing and therefore sleep study has been ordered.  He did not get his chest x-ray ordered  during his last visit but did have it today.  This shows no new infiltrates.  Chronic changes of COPD.  No changes in cough, sputum production.  No hemoptysis.  No chest pain, has chronic orthopnea, no paroxysmal nocturnal dyspnea.  No other complaint.   Review of Systems A 10 point review of systems was performed and it is as noted above otherwise negative.  Allergies  Allergen Reactions  . Flonase [Fluticasone Propionate] Other (See Comments)    Per pt, causes rebound effect    Current Meds  Medication Sig  . albuterol (VENTOLIN HFA) 108 (90 Base) MCG/ACT inhaler USE 2 PUFFS EVERY 6 HOURS AS NEEDED FOR WHEEZING  . alfuzosin (UROXATRAL) 10 MG 24 hr tablet Take 10 mg by mouth daily with breakfast.  . aspirin 81 MG tablet Take 81 mg by mouth every morning.   Marland Kitchen atorvastatin (LIPITOR) 40 MG tablet Take 1 tablet (40 mg total) by mouth daily.  . Budeson-Glycopyrrol-Formoterol (BREZTRI AEROSPHERE) 160-9-4.8 MCG/ACT AERO Inhale 2 puffs into the lungs 2 (two) times daily.  . cyclobenzaprine (FLEXERIL) 10 MG tablet TAKE 1 TABLET BY MOUTH TWICE A DAY AS NEEDED FOR MUSCLE SPASMS  . diphenhydrAMINE (BENADRYL) 25 MG tablet Take 25 mg by mouth every morning.  Marland Kitchen EPINEPHrine 0.3 mg/0.3 mL IJ SOAJ injection Inject 0.3 mg into the muscle as needed for anaphylaxis.  . fexofenadine (ALLEGRA) 180 MG tablet Take 180 mg by mouth daily.  Marland Kitchen HYDROcodone-acetaminophen (NORCO/VICODIN) 5-325 MG tablet Take 1 tablet by mouth every 6 (six) hours as needed for moderate pain.  Marland Kitchen ipratropium (ATROVENT) 0.06 % nasal spray Place 2 sprays into both nostrils 4 (four) times daily as needed for rhinitis.  Marland Kitchen montelukast (SINGULAIR) 10 MG tablet Take  1 tablet (10 mg total) by mouth at bedtime.  . Multiple Vitamin (MULTIVITAMIN) tablet Take 1 tablet by mouth daily.  . pantoprazole (PROTONIX) 40 MG tablet TAKE 1 TABLET BY MOUTH 2 TIMES DAILY BEFORE A MEAL. [place] AND SUPPER  . vitamin B-12 (CYANOCOBALAMIN) 500 MCG  tablet Take 500 mcg by mouth every morning.    Immunization History  Administered Date(s) Administered  . Fluad Quad(high Dose 65+) 09/09/2019  . Influenza,inj,Quad PF,6+ Mos 09/11/2014, 09/24/2015, 09/20/2016, 09/14/2017, 08/29/2018  . PFIZER SARS-COV-2 Vaccination 12/20/2019, 01/10/2020  . Pneumococcal Conjugate-13 09/11/2014  . Pneumococcal Polysaccharide-23 12/04/2009   Social History   Tobacco Use  . Smoking status: Current Every Day Smoker    Packs/day: 0.50    Years: 60.00    Pack years: 30.00    Types: Cigarettes  . Smokeless tobacco: Never Used  . Tobacco comment: 4-5 cigs per day 10/20/2019  Substance Use Topics  . Alcohol use: Yes    Alcohol/week: 14.0 standard drinks    Types: 14 Cans of beer per week    Comment: socially       Objective:   Physical Exam BP 130/60 (BP Location: Right Arm, Cuff Size: Normal)   Pulse (!) 117   Temp 98.1 F (36.7 C) (Oral)   Ht 5\' 9"  (1.753 m)   Wt 153 lb 6.4 oz (69.6 kg)   SpO2 93%   BMI 22.65 kg/m   GENERAL: Disheveled appearing elderly male, no acute distress. Use of accessories, chronic. Presents in transport chair (due to dyspnea). HEAD: Normocephalic, atraumatic.  EYES: Pupils equal, round, reactive to light. No scleral icterus.  MOUTH: Nose/mouth/throat not examined due to masking requirements for COVID 19. NECK: Supple. No thyromegaly. Trachea midline. No JVD. No adenopathy. PULMONARY: Distant breath sounds, coarse breath sounds with no other adventitious sounds. CARDIOVASCULAR: S1 and S2. Tachycardic rate with regular rhythm.   Grade 1/6 to 2/6 systolic ejection murmur left sternal border. GASTROINTESTINAL: Benign. MUSCULOSKELETAL: No joint deformity, no clubbing, no edema.  NEUROLOGIC: No overt focal deficit noted. Speech is fluent. SKIN: Intact,warm,dry. PSYCH: Mood and behavior are normal.  Overnight oximetry requested during his prior visit was reviewed previously.  The patient was noted to have  nocturnal oxygen desaturations as well as erratic pattern suggestive of possible obstructive sleep apnea.  Sleep study has been ordered.  2D echo obtained 06/01/2020: No evidence of cor pulmonale.  LVEF 60 to 65%, mild aortic sclerosis  Chest x-ray performed today: Severe COPD changes, no acute process.     Assessment & Plan:     ICD-10-CM   1. Stage 3 severe COPD by GOLD classification ([place])  J44.9    Continue Breztri Advised to discontinue smoking Chest x-ray ordered during last visit obtained today No acute issues, chronic changes only  2. Severe exertional dyspnea, likely fully explained by COPD  R06.00    Aggravated by ongoing tobacco use Prior ambulatory oximetry shows no desaturations with ambulation  3. Nocturnal hypoxemia due to emphysema (HCC)  J43.9    G47.36    Overnight oximetry shows nocturnal hypoxemia Patient on 2 L/min nasal cannula O2 Oximetry also suggestive of sleep apnea Obtain sleep study  4. Tachycardia  N82.9    Diastolic dysfunction noted No evidence of cor pulmonale Follows with cardiology  5. Tobacco dependence due to cigarettes  F17.210    Counseled with regards to discontinuation of smoking   Current Meds  Medication Sig  . albuterol (VENTOLIN HFA) 108 (90 Base) MCG/ACT inhaler  USE 2 PUFFS EVERY 6 HOURS AS NEEDED FOR WHEEZING  . alfuzosin (UROXATRAL) 10 MG 24 hr tablet Take 10 mg by mouth daily with breakfast.  . aspirin 81 MG tablet Take 81 mg by mouth every morning.   Marland Kitchen atorvastatin (LIPITOR) 40 MG tablet Take 1 tablet (40 mg total) by mouth daily.  . Budeson-Glycopyrrol-Formoterol (BREZTRI AEROSPHERE) 160-9-4.8 MCG/ACT AERO Inhale 2 puffs into the lungs 2 (two) times daily.  . cyclobenzaprine (FLEXERIL) 10 MG tablet TAKE 1 TABLET BY MOUTH TWICE A DAY AS NEEDED FOR MUSCLE SPASMS  . diphenhydrAMINE (BENADRYL) 25 MG tablet Take 25 mg by mouth every morning.  Marland Kitchen EPINEPHrine 0.3 mg/0.3 mL IJ SOAJ injection Inject 0.3 mg into the muscle as needed for  anaphylaxis.  . fexofenadine (ALLEGRA) 180 MG tablet Take 180 mg by mouth daily.  Marland Kitchen HYDROcodone-acetaminophen (NORCO/VICODIN) 5-325 MG tablet Take 1 tablet by mouth every 6 (six) hours as needed for moderate pain.  Marland Kitchen ipratropium (ATROVENT) 0.06 % nasal spray Place 2 sprays into both nostrils 4 (four) times daily as needed for rhinitis.  Marland Kitchen montelukast (SINGULAIR) 10 MG tablet Take 1 tablet (10 mg total) by mouth at bedtime.  . Multiple Vitamin (MULTIVITAMIN) tablet Take 1 tablet by mouth daily.  . pantoprazole (PROTONIX) 40 MG tablet TAKE 1 TABLET BY MOUTH 2 TIMES DAILY BEFORE A MEAL. [place] AND SUPPER  . vitamin B-12 (CYANOCOBALAMIN) 500 MCG tablet Take 500 mcg by mouth every morning.     Discussion:  Unfortunately continues to engage in cigarette smoking.  He was advised to discontinue the same.  He will have a sleep study performed in lab due to his need of oxygen.  He appears to be doing better on Breztri and we will continue this medication.  Await sleep study to determine whether he requires further interventions.  Follow-up will be in 3 months time he is to contact us prior to that time should any new difficulties arise.  Renold Don, MD [facility]   *This note was dictated using voice recognition software/Dragon.  Despite best efforts to proofread, errors can occur which can change the meaning.  Any change was purely unintentional.

## 2020-06-17 NOTE — Patient Instructions (Signed)
We are giving you more samples of Breztri.  Breztri is 2 puffs twice a day, make sure you rinse your mouth well after you use it.  You may use your albuterol (rescue) inhaler around midday if needed.  We have schedule you for a chest x-ray today please call 1 ahead and have that done.  We will see you in follow-up in 3 months time call sooner should any new difficulties arise.

## 2020-06-18 ENCOUNTER — Encounter: Payer: Self-pay | Admitting: Pulmonary Disease

## 2020-06-26 ENCOUNTER — Other Ambulatory Visit: Payer: Self-pay | Admitting: Pulmonary Disease

## 2020-07-07 ENCOUNTER — Telehealth: Payer: Self-pay

## 2020-07-07 ENCOUNTER — Telehealth: Payer: PPO

## 2020-07-07 NOTE — Chronic Care Management (AMB) (Signed)
Patient would like to cancel CCM appointment for today. He is not interested in CCM services. Will unenroll from program.  Debbora Dus, PharmD Clinical Pharmacist [facility] [phone]

## 2020-07-07 NOTE — Chronic Care Management (AMB) (Deleted)
Chronic Care Management Pharmacy  Name: Parker Martinez  MRN: [id number] DOB: 08-09-1941  Chief Complaint/ HPI  Parker Martinez,  79 y.o. , male presents for their Initial CCM visit with the clinical pharmacist via telephone.  PCP : Parker Bush, MD  Specialists:   Parker Martinez, pulmonary   Parker Martinez, Urology  Parker Martinez, Audiology  Parker Martinez, Ophthalmology - cataract surgery 01/26/20  Parker Martinez, Vascular   Parker Martinez, Gastroenterology   Parker Martinez, Neurology   Their chronic conditions include: hypertension, dyslipidemia, PVD, COPD, GERD, chronic pain, vitamin D deficiency   Patient concerns: vertigo symptoms for several years, unsteadiness, no falls; patient is aware several of his medications have a warning for drowsiness/dizziness and wondering if they may be contributing to these symptoms  We reviewed medication list, most likely contributors are Benadryl daily (in addition to Allegra daily) and alfuzosin. Patient reports some dizziness when he first stands up in the morning. Asked him to check his BP for orthostatic hypotension. Potentially some anticholinergic effects from ipratropium nasal spray in addition to his daily inhalers (although symptoms began prior to patient starting ipratropium) but mostly localized effect.  Plan: Patient to check his BP upon 2-5 minutes of standing, will follow up to assess; patient will also discuss switching to tamsulosin with his urologist, discontinue Benadryl, and limit alcohol intake.   Office Visits:  10/24/19: Parker Martinez - refer back to [facility] for PAD, recommend Shingrix  10/20/19: Parker Martinez - no medication changes  Consult Visit:  06/17/20: Pulmonology - continue Breztri, schedule chest x-ray, f/u in 3 months  04/29/20: Pulmonology - trail Breztri, 2 puffs BID, rinse after each use. Stop Spiriva and Symbicort. Schedule echo for heart murmur. F/u in 4-6 weeks.   03/29/20: Urology - urinary flow is slowly down, on alfuzosin,  check PSA, recommend cystoscopy, rtc 6 months  09/29/19: Parker Martinez, Urology - no medication changes, continue alfuzosin  Allergies  Allergen Reactions  . Flonase [Fluticasone Propionate] Other (See Comments)    Per pt, causes rebound effect     Medications: Outpatient Encounter Medications as of 07/07/2020  Medication Sig Note  . albuterol (VENTOLIN HFA) 108 (90 Base) MCG/ACT inhaler USE 2 PUFFS EVERY 6 HOURS AS NEEDED FOR WHEEZING   . alfuzosin (UROXATRAL) 10 MG 24 hr tablet Take 10 mg by mouth daily with breakfast.   . aspirin 81 MG tablet Take 81 mg by mouth every morning.    Marland Kitchen atorvastatin (LIPITOR) 40 MG tablet Take 1 tablet (40 mg total) by mouth daily.   . Budeson-Glycopyrrol-Formoterol (BREZTRI AEROSPHERE) 160-9-4.8 MCG/ACT AERO Inhale 2 puffs into the lungs 2 (two) times daily.   . Budeson-Glycopyrrol-Formoterol (BREZTRI AEROSPHERE) 160-9-4.8 MCG/ACT AERO Inhale 2 puffs into the lungs in the morning and at bedtime.   . cyclobenzaprine (FLEXERIL) 10 MG tablet TAKE 1 TABLET BY MOUTH TWICE A DAY AS NEEDED FOR MUSCLE SPASMS 01/16/2020: Takes on very rare occasions  . diphenhydrAMINE (BENADRYL) 25 MG tablet Take 25 mg by mouth every morning.   Marland Kitchen EPINEPHrine 0.3 mg/0.3 mL IJ SOAJ injection Inject 0.3 mg into the muscle as needed for anaphylaxis.   . fexofenadine (ALLEGRA) 180 MG tablet Take 180 mg by mouth daily.   Marland Kitchen HYDROcodone-acetaminophen (NORCO/VICODIN) 5-325 MG tablet Take 1 tablet by mouth every 6 (six) hours as needed for moderate pain.   Marland Kitchen ipratropium (ATROVENT) 0.06 % nasal spray PLACE 2 SPRAYS INTO BOTH NOSTRILS 4 (FOUR) TIMES DAILY AS NEEDED FOR RHINITIS.   Marland Kitchen montelukast (SINGULAIR) 10 MG tablet Take 1  tablet (10 mg total) by mouth at bedtime.   . Multiple Vitamin (MULTIVITAMIN) tablet Take 1 tablet by mouth daily.   . pantoprazole (PROTONIX) 40 MG tablet TAKE 1 TABLET BY MOUTH 2 TIMES DAILY BEFORE A MEAL. [place] AND SUPPER   . vitamin B-12 (CYANOCOBALAMIN) 500  MCG tablet Take 500 mcg by mouth every morning.     No facility-administered encounter medications on file as of 07/07/2020.    Current Diagnosis/Assessment: Goals    . Patient Stated     10/20/2019, I will maintain and continue medications as prescribed.     . Pharmacy Care Plan     Current Barriers:  . Chronic Disease Management support, education, and care coordination needs related to  hypertension, dyslipidemia, peripheral vascular disease, COPD, GERD, chronic pain, vitamin D deficiency  Pharmacist Clinical Goal(s):  Parker Martinez patient concerns about dizziness and instability. Recommend discontinuing Benadryl daily and keep on hand for allergic reactions. Also recommend self-checking blood pressure while lying down and again within 2-5 minutes of standing. Keep log of blood pressure changes. Patient to discuss alternatives to alfuzosin with his urologist, including tamsulosin (Flomax) to reduce side effects of dizziness and unsteadiness. . Recommend complete tobacco cessation. . Recommend 2 doses of Shingrix vaccine from local pharmacy.  Interventions: . Comprehensive medication review performed. Marland Kitchen Updated medication list for accuracy  Patient Self Care Activities:  . Self administers medications as prescribed . Self monitors blood pressure   Initial goal documentation       COPD / Tobacco  Managed by pulmonology  Last spirometry score: 05/2016 48% Exacerbations requiring hospitalization: last 05/28/18, none in past year Symptoms: very short of breath walking from house to car, going up and down steps  Gold Grade: Gold 3 (FEV1 30-49%) Current COPD Classification:  B (high sx, <2 exacerbations/yr)  Eosinophil count:   Lab Results  Component Value Date/Time   EOSPCT 2.0 10/17/2019 10:24 AM  %                               Eos (Absolute):  Lab Results  Component Value Date/Time   EOSABS 0.2 10/17/2019 10:24 AM   Current use: 3-4 cigarettes/day (started in 2020 after  pandemic)  Tobacco Status Social History   Tobacco Use  Smoking Status Current Every Day Smoker  . Packs/day: 0.50  . Years: 60.00  . Pack years: 30.00  . Types: Cigarettes  Smokeless Tobacco Never Used  Tobacco Comment   4-5 cigs per day 10/20/2019   Patient has failed these meds in past: Dulera (unknown) Patient is currently controlled on the following medications:   Albuterol - 2 puffs every 6 hours as needed  Symbicort 160-4.5 mcg/act - Inhale 2 puffs twice daily (AM, PM)   Spiriva 2.5 mcg/act - Inhale 2 puffs once daily (Martinez) Using maintenance inhaler regularly? Yes Frequency of rescue inhaler use:  multiple times per day (3-5x/day)  We discussed: patient reports starting maintenance inhalers 2-3 years ago, noticed breathing a little better, able to move a little bit more, but not a major change. He has discussed next treatment options with pulmonology of albuterol nebulizer in the morning prior to considering oxygen.  Plan: Continue current medications; Encourage smoking cessation. ,  Hypertension   Office blood pressures are: BP Readings from Last 3 Encounters:  06/17/20 130/60  04/29/20 140/60  02/16/20 (!) 120/57   CMP Latest Ref Rng & Units 10/17/2019 02/20/2019  10/15/2018  Glucose 70 - 99 mg/dL 105(H) 112(H) 113(H)  BUN 6 - 23 mg/dL 5(L) 5(L) 6  Creatinine 0.40 - 1.50 mg/dL 0.78 0.77 0.84  Sodium 135 - 145 mEq/L 144 138 142  Potassium 3.5 - 5.1 mEq/L 4.1 3.9 5.0  Chloride 96 - 112 mEq/L 103 104 103  CO2 19 - 32 mEq/L 28 26 30   Calcium 8.4 - 10.5 mg/dL 9.2 9.7 10.0  Total Protein 6.0 - 8.3 g/dL 6.2 - 6.6  Total Bilirubin 0.2 - 1.2 mg/dL 0.6 - 0.7  Alkaline Phos 39 - 117 U/L 69 - 75  AST 0 - 37 U/L 20 - 19  ALT 0 - 53 U/L 19 - 17   Patient has failed these meds in the past: none Patient checks BP at home infrequently Patient home BP readings are ranging: none reported Patient is currently controlled on the following medications:   No pharmacotherapy   We discussed: patient has home BP monitor, check for orthostatic hypotension  Plan: Continue control with diet and exercise; Follow up call in 1 week to assess for hypotension.  Hyperlipidemia/PVD   CBC Latest Ref Rng & Units 10/17/2019 02/20/2019 10/15/2018  WBC 4.0 - 10.5 K/uL 7.8 7.3 7.5  Hemoglobin 13.0 - 17.0 g/dL 13.9 12.7(L) 14.3  Hematocrit 39 - 52 % 41.6 39.9 42.2  Platelets 150 - 400 K/uL 235.0 239 237.0   Lipid Panel     Component Value Date/Time   CHOL 153 10/17/2019 1024   CHOL 183 10/19/2010 0000   TRIG 80.0 10/17/2019 1024   TRIG 111 10/19/2010 0000   HDL 91.00 10/17/2019 1024   CHOLHDL 2 10/17/2019 1024   VLDL 16.0 10/17/2019 1024   LDLCALC 46 10/17/2019 1024   LDLDIRECT 58.0 10/09/2017 1232  LDL goal < 100  Patient has failed these meds in past: none Patient is currently controlled on the following medications:   Atorvastatin 40 mg - 1 tablet daily  Aspirin 81 mg - 1 tablet daily We discussed: aspirin indicated for PVD  Plan: Continue current medications  No diagnosis: Allergies  No current specialists Symptoms: 3 years ago last episode History: 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, after 5-6 episodes); unable to identify any specific allergen, thought maybe autoimmune Patient has failed these meds in past: flonase (rebound symptoms) Patient is currently controlled on the following medications:  Fexofenadine 180 mg - 1 tablet daily  Benadryl 25 mg - 1 tablet every morning   Ipratropium 0.06% nasal spray - 2 sprays in both nostril four times daily as needed (started a couple months ago, uses 2-3 times day)  Montelukast 10 mg - 1 tablet at bedtime  Epi-pen 0.3mg /4mL - as needed for anaphylaxis   Plan: Continue current medications; Recommend discontinuing OTC Benadryl daily. Keep on hand for acute allergic reactions.  GERD  Followed by gastroenterology, [name] Patient has failed these meds in past: none, has history of GI  stretching Patient is currently controlled on the following medications:  Pantoprazole 40 mg - 1 tablet twice daily We discussed: no problems recently, increased to twice daily 3 years ago   Plan: Continue current medications  Chronic Pain  Location: back problem, surgery 2 years ago Parker Martinez, neuro) Symptom control: no pain since surgery, other than occasional Tylenol Patient has failed these meds in past: none Patient is currently controlled on the following medications:   Cyclobenzaprine 10 mg - twice daily as needed (takes rarely)  Hydrocodone-acetaminophen 5-325 mg - 1 tablet every 6  hours as needed (not taking, hasn't refilled in 1-2 years)  Plan: Continue current medications; Remove hydrocodone-APAP from medication list, patient hasn't taken in > 1 year.  Prostate Cancer/ Urinary Urgency  Managed by Urology, Parker Martinez  Patient has failed these meds in past: none  Patient is currently controlled on the following medications:   Alfuzosin 10 mg - 1 tablet with breakfast  We discussed: started medication prior to prostate surgery, 5-6 years ago; patient reports potential side effects of dizziness, unsteadiness Symptoms: still has urgency at times, not too bothersome, primary symptom is incomplete voiding We discussed: consider tamsulosin as alternative due to less side effects of dizziness, orthostasis - patient will discuss with urologist at next visit  Plan: Continue current medications; Discuss alternatives at next urology visit.  Medication Management  OTCs: multivitamin (with vitamin D, vitamin D WNL), Vitamin B12 500 mcg - once daily (09/16/15 B12 WNL)  Pharmacy: [facility], regular vials  Adherence: pillbox   Affordability: no concerns, goes into donut hole each year but income above PAP limits  Vaccines: PCV13, PPSV23, COVID-19, influenza - up to date, Tdap (patient reports up to date, we do not have records of this), Shingrix - recommended, pt reports hasn't made it to  the pharmacy yet, but plans on it; Ask patient to provide Td or Tdap records at next visit.  CCM Follow Up: Wednesday, July 07, 2020 10:00 AM  Debbora Dus, PharmD Clinical Pharmacist [facility] at [facility] [phone]

## 2020-07-10 ENCOUNTER — Other Ambulatory Visit: Payer: Self-pay | Admitting: Pulmonary Disease

## 2020-07-17 ENCOUNTER — Emergency Department
Admission: EM | Admit: 2020-07-17 | Discharge: 2020-07-17 | Disposition: A | Discharge status: Home / Self Care | Payer: PPO | Attending: Emergency Medicine | Admitting: Emergency Medicine

## 2020-07-17 ENCOUNTER — Other Ambulatory Visit: Payer: Self-pay

## 2020-07-17 ENCOUNTER — Encounter: Payer: Self-pay | Admitting: Emergency Medicine

## 2020-07-17 DIAGNOSIS — R0602 Shortness of breath: Secondary | ICD-10-CM | POA: Insufficient documentation

## 2020-07-17 DIAGNOSIS — T7840XA Allergy, unspecified, initial encounter: Secondary | ICD-10-CM | POA: Diagnosis not present

## 2020-07-17 DIAGNOSIS — F17210 Nicotine dependence, cigarettes, uncomplicated: Secondary | ICD-10-CM | POA: Diagnosis not present

## 2020-07-17 DIAGNOSIS — Z7982 Long term (current) use of aspirin: Secondary | ICD-10-CM | POA: Diagnosis not present

## 2020-07-17 DIAGNOSIS — L299 Pruritus, unspecified: Secondary | ICD-10-CM | POA: Diagnosis present

## 2020-07-17 DIAGNOSIS — J441 Chronic obstructive pulmonary disease with (acute) exacerbation: Secondary | ICD-10-CM | POA: Diagnosis not present

## 2020-07-17 DIAGNOSIS — I10 Essential (primary) hypertension: Secondary | ICD-10-CM | POA: Diagnosis not present

## 2020-07-17 MED ORDER — FAMOTIDINE IN NACL 20-0.9 MG/50ML-% IV SOLN
20.0000 mg | Freq: Once | INTRAVENOUS | Status: AC
  Administered 2020-07-17: 20 mg via INTRAVENOUS
  Filled 2020-07-17: qty 50

## 2020-07-17 MED ORDER — PREDNISONE 20 MG PO TABS: 40.0000 mg | ORAL_TABLET | Freq: Every day | ORAL | 0 refills | Status: AC

## 2020-07-17 MED ORDER — EPINEPHRINE 0.3 MG/0.3ML IJ SOAJ: 0.3000 mg | INTRAMUSCULAR | 1 refills | Status: DC | PRN

## 2020-07-17 MED ORDER — METHYLPREDNISOLONE SODIUM SUCC 125 MG IJ SOLR
125.0000 mg | Freq: Once | INTRAMUSCULAR | Status: AC
  Administered 2020-07-17: 125 mg via INTRAVENOUS
  Filled 2020-07-17: qty 2

## 2020-07-17 NOTE — ED Notes (Signed)
Signature pd failed.  Discharge instructions reviewed with pt; pt verbalized understanding with no further questions.

## 2020-07-17 NOTE — Discharge Instructions (Signed)
Please seek medical attention for any high fevers, chest pain, shortness of breath, change in behavior, persistent vomiting, bloody stool or any other new or concerning symptoms.  

## 2020-07-17 NOTE — ED Triage Notes (Signed)
Pt arrived via POV with reports of unknown allergic reaction, pt states this happens infrequently and has to use an epi pen.  Pt states he used EPIPEN and took 50mg  Benadryl PO prior to arrival, no oral swelling noted, states he has COPD and is short of breath normally and does not feel any more short of breath than normal.  Pt has rash and hives noted to arms and chest.

## 2020-07-17 NOTE — ED Provider Notes (Signed)
[facility] Emergency Department Provider Note   ____________________________________________   I have reviewed the triage vital signs and the nursing notes.   HISTORY  Chief Complaint Allergic Reaction   History limited by: Not Limited   HPI Parker Martinez is a 79 y.o. male who presents to the emergency department today because of concerns for allergic reaction.  The patient states that he has history of episodic anaphylaxis.  Symptoms started roughly 3 hours prior to my evaluation.  Complaining primarily of itchiness and hives to the upper thorax and extremities.  He did give himself an EpiPen which she has had to administer in the past.  The time my exam he denies any acute difficulty with breathing or feeling like his throat was tightening.  Also took Benadryl prior to arrival.  Records reviewed. Per medical record review patient has a history of allergies. COPD.   Past Medical History:  Diagnosis Date  . Abnormal drug screen 10/2013, 03/2015   low Cr, +EtOH, neg hydrocodone (10/2013, again 03/2015)  . Allergic rhinitis   . Arthritis    lumbar and cervical spine  . Carotid stenosis 09/11/2014   40-59% bilat ICA stenosis, rpt 1 yr (09/2014)   . COPD (chronic obstructive pulmonary disease) (HCC)    severe, participated in [place] study  . DDD (degenerative disc disease)    lumbar (HNP R L3/4) s/p ESI, cervical  . Diverticulitis 2015   by CT  . Diverticulosis 2008   severe by colonoscopy and CT  . Duodenitis   . Esophageal stricture 2012   s/p dilation  . GERD (gastroesophageal reflux disease)   . Hemangioma    congenital left arm  . History of kidney stones remote  . HTN (hypertension)    borderline readings at home  . Idiopathic anaphylaxis    h/o recurrent anaphylaxis, unknown trigger, s/p eval at [facility] [phone]), last episode 03/2011  . PAD (peripheral artery disease) ([place])   . Personal history of colonic adenomas 09/02/2013   . Prostate cancer [facility]) 2011   prostate seed implant (Dr. [name])  . Smoker   . Status post dilatation of esophageal stricture   . Subclavian steal syndrome 10/12/2016   Left subclavian steal, with 18 mmHg brachial artery pressure gradient (10/2016). If symptomatic, rec PV consult    Patient Active Problem List   Diagnosis Date Noted  . Aorto-iliac atherosclerosis ([place]) 01/25/2019  . Left lumbar radiculopathy 01/24/2019  . Bilateral hearing loss 10/21/2018  . BPV (benign positional vertigo), right 09/09/2018  . Heart murmur 09/09/2018  . Encounter for chronic pain management 05/15/2017  . Chronic pain in left shoulder 01/30/2017  . Subclavian steal syndrome 10/12/2016  . Abnormal drug screen   . Atherosclerosis of native arteries of extremity with intermittent claudication ([place]) 10/21/2014  . Medicare annual wellness visit, subsequent 09/11/2014  . Carotid stenosis 09/11/2014  . Advanced care planning/counseling discussion 09/11/2014  . Vitamin D deficiency 09/01/2014  . DDD (degenerative disc disease), lumbar   . Personal history of colonic adenomas 09/02/2013  . COPD exacerbation ([place]) 03/04/2013  . Healthcare maintenance 04/25/2012  . Polycythemia secondary to smoking 04/25/2012  . Dyslipidemia 04/25/2012  . HTN (hypertension)   . PVD (peripheral vascular disease) with claudication ([place])   . COPD, severe ([place])   . Prostate cancer ([place])   . GERD (gastroesophageal reflux disease)   . Idiopathic anaphylaxis   . Smoker 09/13/2011  . Esophageal rings - lower esophagus 06/04/2009    Past  Surgical History:  Procedure Laterality Date  . BALLOON DILATION N/A 10/02/2014   Procedure: BALLOON DILATION;  Surgeon: Gatha Mayer, MD;  Location: [facility];  Service: Endoscopy;  Laterality: N/A;  . CATARACT EXTRACTION W/PHACO Right 01/26/2020   Procedure: CATARACT EXTRACTION PHACO AND INTRAOCULAR LENS PLACEMENT (IOC) RIGHT 5.15  00:42.9;  Surgeon: [name], MD;  Location:  [facility];  Service: Ophthalmology;  Laterality: Right;  . CATARACT EXTRACTION W/PHACO Left 02/16/2020   Procedure: CATARACT EXTRACTION PHACO AND INTRAOCULAR LENS PLACEMENT (IOC) LEFT 3.87  00:45.4;  Surgeon: [name], MD;  Location: [facility];  Service: Ophthalmology;  Laterality: Left;  . COLONOSCOPY  08/2013   4 tubular adenomas, severe divertic, rpt 3 yrs [name])  . COLONOSCOPY  10/2016   severe diverticulosis, no f/u recommended [name])  . ESOPHAGOGASTRODUODENOSCOPY  10/2016   2 rings dilated [name])  . ESOPHAGOGASTRODUODENOSCOPY  01/2019   dilated esophageal webs [name])  . ESOPHAGOGASTRODUODENOSCOPY (EGD) WITH PROPOFOL N/A 10/02/2014   WNL, mild esophagitis; Gatha Mayer, MD  . INSERTION PROSTATE RADIATION SEED  01/2015   [name]  . LUMBAR MICRODISCECTOMY Left 03/2019   L L4/L5 [name])  . SHOULDER SURGERY  1961   due to chronic dislocation  . SPIROMETRY  10/2010   severe obstruction, FEV1 41%, ratio 0.46  . TONSILLECTOMY  1944 ?  . UPPER GASTROINTESTINAL ENDOSCOPY  06/08/2009   dilated esophageal stricture, duodenitis  . virtual colonoscopy  2007   [name], rpt rec 5 yrs    Prior to Admission medications   Medication Sig Start Date End Date Taking? Authorizing Provider  albuterol (VENTOLIN HFA) 108 (90 Base) MCG/ACT inhaler USE 2 PUFFS EVERY 6 HOURS AS NEEDED FOR WHEEZING 05/04/20  Yes Ria Bush, MD  alfuzosin (UROXATRAL) 10 MG 24 hr tablet Take 10 mg by mouth daily with breakfast.   Yes [provider]  aspirin 81 MG tablet Take 81 mg by mouth every morning.    Yes [provider]  atorvastatin (LIPITOR) 40 MG tablet Take 1 tablet (40 mg total) by mouth daily. 10/24/19  Yes Ria Bush, MD  Budeson-Glycopyrrol-Formoterol (BREZTRI AEROSPHERE) 160-9-4.8 MCG/ACT AERO Inhale 2 puffs into the lungs in the morning and at bedtime. 06/17/20  Yes Tyler Pita, MD  diphenhydrAMINE (BENADRYL) 25 MG tablet Take 25 mg  by mouth every morning.   Yes [provider]  EPINEPHrine 0.3 mg/0.3 mL IJ SOAJ injection Inject 0.3 mg into the muscle as needed for anaphylaxis.   Yes [provider]  fexofenadine (ALLEGRA) 180 MG tablet Take 180 mg by mouth daily.   Yes [provider]  ipratropium (ATROVENT) 0.06 % nasal spray PLACE 2 SPRAYS INTO BOTH NOSTRILS 4 (FOUR) TIMES DAILY AS NEEDED FOR RHINITIS. 07/01/20  Yes Tyler Pita, MD  montelukast (SINGULAIR) 10 MG tablet Take 1 tablet (10 mg total) by mouth at bedtime. 10/24/19  Yes Ria Bush, MD  Multiple Vitamin (MULTIVITAMIN) tablet Take 1 tablet by mouth daily.   Yes [provider]  pantoprazole (PROTONIX) 40 MG tablet TAKE 1 TABLET BY MOUTH 2 TIMES DAILY BEFORE A MEAL. [place] SUPPER 06/10/20  Yes Gatha Mayer, MD  vitamin B-12 (CYANOCOBALAMIN) 500 MCG tablet Take 500 mcg by mouth every morning.    Yes [provider]    Allergies Flonase [fluticasone propionate]  Family History  Problem Relation Age of Onset  . Lung cancer Father   . Hyperlipidemia Father   . Arthritis  Mother   . Breast cancer Daughter   . Coronary artery disease Neg Hx   . Stroke Neg Hx   . Diabetes Neg Hx     Social History Social History   Tobacco Use  . Smoking status: Current Every Day Smoker    Packs/day: 0.50    Years: 60.00    Pack years: 30.00    Types: Cigarettes  . Smokeless tobacco: Never Used  . Tobacco comment: 4-5 cigs per day 10/20/2019  Vaping Use  . Vaping Use: Never used  Substance Use Topics  . Alcohol use: Yes    Alcohol/week: 14.0 standard drinks    Types: 14 Cans of beer per week    Comment: socially  . Drug use: No    Review of Systems Constitutional: No fever/chills Eyes: No visual changes. ENT: No sore throat. Cardiovascular: Denies chest pain. Respiratory: Positive for chronic shortness of breath. Gastrointestinal: No abdominal pain.  No nausea, no vomiting.  No  diarrhea.   Genitourinary: Negative for dysuria. Musculoskeletal: Negative for back pain. Skin: Hives Neurological: Negative for headaches, focal weakness or numbness.  ____________________________________________   PHYSICAL EXAM:  VITAL SIGNS: ED Triage Vitals  Enc Vitals Group     BP 07/17/20 1420 127/79     Pulse Rate 07/17/20 1420 (!) 146     Resp 07/17/20 1420 (!) 24     Temp 07/17/20 1420 98.2 F (36.8 C)     Temp Source 07/17/20 1420 Oral     SpO2 07/17/20 1420 92 %     Weight 07/17/20 1427 150 lb (68 kg)     Height 07/17/20 1427 5\' 9"  (1.753 m)     Head Circumference --      Peak Flow --      Pain Score 07/17/20 1427 0   Constitutional: Alert and oriented.  Eyes: Conjunctivae are normal.  ENT      Head: Normocephalic and atraumatic.      Nose: No congestion/rhinnorhea.      Mouth/Throat: Mucous membranes are moist.      Neck: No stridor. Hematological/Lymphatic/Immunilogical: No cervical lymphadenopathy. Cardiovascular: Normal rate, regular rhythm.  No murmurs, rubs, or gallops.  Respiratory: Normal respiratory effort without tachypnea nor retractions. Breath sounds are clear and equal bilaterally. No wheezes/rales/rhonchi. Gastrointestinal: Soft and non tender. No rebound. No guarding.  Genitourinary: Deferred Musculoskeletal: Normal range of motion in all extremities. No lower extremity edema. Neurologic:  Normal speech and language. No gross focal neurologic deficits are appreciated.  Skin:  Skin is warm, dry and intact. No rash noted. Psychiatric: Mood and affect are normal. Speech and behavior are normal. Patient exhibits appropriate insight and judgment.  ____________________________________________    LABS (pertinent positives/negatives)  None  ____________________________________________   EKG  None  ____________________________________________     RADIOLOGY  None  ____________________________________________   PROCEDURES  Procedures  ____________________________________________   INITIAL IMPRESSION / ASSESSMENT AND PLAN / ED COURSE  Pertinent labs & imaging results that were available during my care of the patient were reviewed by me and considered in my medical decision making (see chart for details).   Patient presented to the emergency department because of concern for allergic reaction. The patient had taken epipen and benadryl at home. Was given solumedrol and pepcid here. Symptoms stayed improved. Will plan on discharging with prescription for prednisone and epipen.  ____________________________________________   FINAL CLINICAL IMPRESSION(S) / ED DIAGNOSES  Final diagnoses:  Allergic reaction, initial encounter     Note: This dictation was  prepared with [employer] . Any transcriptional errors that result from this process are unintentional     Nance Pear, MD 07/17/20 1734

## 2020-07-18 DIAGNOSIS — R0600 Dyspnea, unspecified: Secondary | ICD-10-CM | POA: Diagnosis not present

## 2020-08-12 ENCOUNTER — Other Ambulatory Visit: Payer: Self-pay | Admitting: Pulmonary Disease

## 2020-08-18 DIAGNOSIS — R0600 Dyspnea, unspecified: Secondary | ICD-10-CM | POA: Diagnosis not present

## 2020-08-19 ENCOUNTER — Other Ambulatory Visit: Payer: Self-pay | Admitting: Pulmonary Disease

## 2020-08-19 ENCOUNTER — Other Ambulatory Visit: Payer: Self-pay | Admitting: Family Medicine

## 2020-09-06 ENCOUNTER — Ambulatory Visit
Admission: EM | Admit: 2020-09-06 | Discharge: 2020-09-06 | Disposition: A | Discharge status: Home / Self Care | Payer: PPO

## 2020-09-06 ENCOUNTER — Other Ambulatory Visit: Payer: Self-pay | Admitting: Internal Medicine

## 2020-09-06 ENCOUNTER — Emergency Department
Admission: EM | Admit: 2020-09-06 | Discharge: 2020-09-06 | Discharge status: Against Medical Advice | Payer: PPO | Source: Home / Self Care

## 2020-09-06 ENCOUNTER — Ambulatory Visit: Payer: PPO | Attending: Emergency Medicine

## 2020-09-06 ENCOUNTER — Telehealth: Payer: Self-pay

## 2020-09-06 DIAGNOSIS — M79604 Pain in right leg: Secondary | ICD-10-CM | POA: Diagnosis not present

## 2020-09-06 DIAGNOSIS — M7989 Other specified soft tissue disorders: Secondary | ICD-10-CM | POA: Insufficient documentation

## 2020-09-06 DIAGNOSIS — M79661 Pain in right lower leg: Secondary | ICD-10-CM | POA: Diagnosis not present

## 2020-09-06 NOTE — Telephone Encounter (Signed)
Pt walked in office and said was on hold as caller # [phone] for a long time and then was cut off; pt came to office and wants to make sure someone is aware of the problem with our phone system; I advised next time if problem with phone to chose option 4 for nurse line.pt voiced understanding; pt said for 1 wk to 1 1/2 wks pt has swelling in both legs but today rt lower leg is much more swollen, with redness and pain level of 9. No CP and no more SOB than usual pt has COPD; pt is going to [facility] [address] [facility] eval. FYI to Dr Darnell Level.

## 2020-09-06 NOTE — Telephone Encounter (Signed)
Pt called back and said he waited 2 hrs at [facility] in [place] to be told he needs an [place] and pt needed to go to [facility] ED or [facility]. Pt did not go to ED and wanted to know if Dr Darnell Level could order the [place]. I advised pt Dr Darnell Level could not order the [place] unless pt was seen; no available appts at [facility]; pt said his leg was still swollen, painful and red. Dr Darnell Level said pt should go to [facility] ED now for eval and testing. Pt voiced understanding and will go to ED. FYI to Dr Darnell Level I called [facility] and they do not do [place] there.

## 2020-09-06 NOTE — ED Notes (Signed)
Patient presented to [facility] with complaints consistent of DVT (swelling lower extremities, redness on right calf, pain in right calf). Informed patient that [facility] does not have the ability to definitively r/o DVT and should proceed to [facility] ED. Patient opted to go to [facility].

## 2020-09-06 NOTE — ED Triage Notes (Signed)
First Nurse Note:  Arrives to ED for evaluation of possible DVT to right lower leg.

## 2020-09-07 ENCOUNTER — Ambulatory Visit (INDEPENDENT_AMBULATORY_CARE_PROVIDER_SITE_OTHER): Payer: PPO | Admitting: Family Medicine

## 2020-09-07 ENCOUNTER — Encounter: Payer: Self-pay | Admitting: Family Medicine

## 2020-09-07 ENCOUNTER — Other Ambulatory Visit: Payer: Self-pay

## 2020-09-07 VITALS — BP 140/62 | HR 107 | Temp 97.6°F | Ht 69.0 in | Wt 148.2 lb

## 2020-09-07 DIAGNOSIS — M7989 Other specified soft tissue disorders: Secondary | ICD-10-CM | POA: Diagnosis not present

## 2020-09-07 DIAGNOSIS — J449 Chronic obstructive pulmonary disease, unspecified: Secondary | ICD-10-CM

## 2020-09-07 DIAGNOSIS — R600 Localized edema: Secondary | ICD-10-CM | POA: Insufficient documentation

## 2020-09-07 DIAGNOSIS — Z23 Encounter for immunization: Secondary | ICD-10-CM

## 2020-09-07 DIAGNOSIS — F17200 Nicotine dependence, unspecified, uncomplicated: Secondary | ICD-10-CM

## 2020-09-07 LAB — CBC WITH DIFFERENTIAL/PLATELET
Basophils Absolute: 0 10*3/uL (ref 0.0–0.1)
Basophils Relative: 0.8 % (ref 0.0–3.0)
Eosinophils Absolute: 0.2 10*3/uL (ref 0.0–0.7)
Eosinophils Relative: 4 % (ref 0.0–5.0)
HCT: 39.4 % (ref 39.0–52.0)
Hemoglobin: 13.3 g/dL (ref 13.0–17.0)
Lymphocytes Relative: 35.9 % (ref 12.0–46.0)
Lymphs Abs: 1.6 10*3/uL (ref 0.7–4.0)
MCHC: 33.7 g/dL (ref 30.0–36.0)
MCV: 95.7 fl (ref 78.0–100.0)
Monocytes Absolute: 0.8 10*3/uL (ref 0.1–1.0)
Monocytes Relative: 18.3 % — ABNORMAL HIGH (ref 3.0–12.0)
Neutro Abs: 1.9 10*3/uL (ref 1.4–7.7)
Neutrophils Relative %: 41 % — ABNORMAL LOW (ref 43.0–77.0)
Platelets: 179 10*3/uL (ref 150.0–400.0)
RBC: 4.12 Mil/uL — ABNORMAL LOW (ref 4.22–5.81)
RDW: 12.8 % (ref 11.5–15.5)
WBC: 4.6 10*3/uL (ref 4.0–10.5)

## 2020-09-07 LAB — URIC ACID: Uric Acid, Serum: 7.9 mg/dL — ABNORMAL HIGH (ref 4.0–7.8)

## 2020-09-07 LAB — BASIC METABOLIC PANEL
BUN: 9 mg/dL (ref 6–23)
CO2: 29 mEq/L (ref 19–32)
Calcium: 9 mg/dL (ref 8.4–10.5)
Chloride: 104 mEq/L (ref 96–112)
Creatinine, Ser: 0.84 mg/dL (ref 0.40–1.50)
GFR: 82.53 mL/min (ref >60.00)
Glucose, Bld: 101 mg/dL — ABNORMAL HIGH (ref 70–99)
Potassium: 4.4 mEq/L (ref 3.5–5.1)
Sodium: 141 mEq/L (ref 135–145)

## 2020-09-07 MED ORDER — DOXYCYCLINE HYCLATE 100 MG PO TABS: 100.0000 mg | ORAL_TABLET | Freq: Two times a day (BID) | ORAL | 0 refills | Status: DC

## 2020-09-07 MED ORDER — PREDNISONE 20 MG PO TABS: ORAL_TABLET | ORAL | 0 refills | Status: DC

## 2020-09-07 NOTE — Patient Instructions (Addendum)
Flu shot today  Labs today  I am suspicious for gout flare however do want to cover both this and infection while we get results back.  We will be in touch with results. Continue elevating leg, continue tylenol for pain.   Gout  Gout is a condition that causes painful swelling of the joints. Gout is a type of inflammation of the joints (arthritis). This condition is caused by having too much uric acid in the body. Uric acid is a chemical that forms when the body breaks down substances called purines. Purines are important for building body proteins. When the body has too much uric acid, sharp crystals can form and build up inside the joints. This causes pain and swelling. Gout attacks can happen quickly and may be very painful (acute gout). Over time, the attacks can affect more joints and become more frequent (chronic gout). Gout can also cause uric acid to build up under the skin and inside the kidneys. What are the causes? This condition is caused by too much uric acid in your blood. This can happen because:  Your kidneys do not remove enough uric acid from your blood. This is the most common cause.  Your body makes too much uric acid. This can happen with some cancers and cancer treatments. It can also occur if your body is breaking down too many red blood cells (hemolytic anemia).  You eat too many foods that are high in purines. These foods include organ meats and some seafood. Alcohol, especially beer, is also high in purines. A gout attack may be triggered by trauma or stress. What increases the risk? You are more likely to develop this condition if you:  Have a family history of gout.  Are male and middle-aged.  Are male and have gone through menopause.  Are obese.  Frequently drink alcohol, especially beer.  Are dehydrated.  Lose weight too quickly.  Have an organ transplant.  Have lead poisoning.  Take certain medicines, including aspirin, cyclosporine, diuretics,  levodopa, and niacin.  Have kidney disease.  Have a skin condition called psoriasis. What are the signs or symptoms? An attack of acute gout happens quickly. It usually occurs in just one joint. The most common place is the big toe. Attacks often start at night. Other joints that may be affected include joints of the feet, ankle, knee, fingers, wrist, or elbow. Symptoms of this condition may include:  Severe pain.  Warmth.  Swelling.  Stiffness.  Tenderness. The affected joint may be very painful to touch.  Shiny, red, or purple skin.  Chills and fever. Chronic gout may cause symptoms more frequently. More joints may be involved. You may also have white or yellow lumps (tophi) on your hands or feet or in other areas near your joints. How is this diagnosed? This condition is diagnosed based on your symptoms, medical history, and physical exam. You may have tests, such as:  Blood tests to measure uric acid levels.  Removal of joint fluid with a thin needle (aspiration) to look for uric acid crystals.  X-rays to look for joint damage. How is this treated? Treatment for this condition has two phases: treating an acute attack and preventing future attacks. Acute gout treatment may include medicines to reduce pain and swelling, including:  NSAIDs.  Steroids. These are strong anti-inflammatory medicines that can be taken by mouth (orally) or injected into a joint.  Colchicine. This medicine relieves pain and swelling when it is taken soon after an attack.  It can be given by mouth or through an IV. Preventive treatment may include:  Daily use of smaller doses of NSAIDs or colchicine.  Use of a medicine that reduces uric acid levels in your blood.  Changes to your diet. You may need to see a dietitian about what to eat and drink to prevent gout. Follow these instructions at home: During a gout attack   If directed, put ice on the affected area: ? Put ice in a plastic  bag. ? Place a towel between your skin and the bag. ? Leave the ice on for 20 minutes, 2-3 times a day.  Raise (elevate) the affected joint above the level of your heart as often as possible.  Rest the joint as much as possible. If the affected joint is in your leg, you may be given crutches to use.  Follow instructions from your health care provider about eating or drinking restrictions. Avoiding future gout attacks  Follow a low-purine diet as told by your dietitian or health care provider. Avoid foods and drinks that are high in purines, including liver, kidney, anchovies, asparagus, herring, mushrooms, mussels, and beer.  Maintain a healthy weight or lose weight if you are overweight. If you want to lose weight, talk with your health care provider. It is important that you do not lose weight too quickly.  Start or maintain an exercise program as told by your health care provider. Eating and drinking  Drink enough fluids to keep your urine pale yellow.  If you drink alcohol: ? Limit how much you use to:  0-1 drink a day for women.  0-2 drinks a day for men. ? Be aware of how much alcohol is in your drink. In the [place] one drink equals one 12 oz bottle of beer (355 mL) one 5 oz glass of wine (148 mL), or one 1 oz glass of hard liquor (44 mL). General instructions  Take over-the-counter and prescription medicines only as told by your health care provider.  Do not drive or use heavy machinery while taking prescription pain medicine.  Return to your normal activities as told by your health care provider. Ask your health care provider what activities are safe for you.  Keep all follow-up visits as told by your health care provider. This is important. Contact a health care provider if you have:  Another gout attack.  Continuing symptoms of a gout attack after 10 days of treatment.  Side effects from your medicines.  Chills or a fever.  Burning pain when you  urinate.  Pain in your lower back or belly. Get help right away if you:  Have severe or uncontrolled pain.  Cannot urinate. Summary  Gout is painful swelling of the joints caused by inflammation.  The most common site of pain is the big toe, but it can affect other joints in the body.  Medicines and dietary changes can help to prevent and treat gout attacks. This information is not intended to replace advice given to you by your health care provider. Make sure you discuss any questions you have with your health care provider. Document Revised: 06/12/2018 Document Reviewed: 06/12/2018 Elsevier Patient Education  [place].

## 2020-09-07 NOTE — Telephone Encounter (Signed)
Pt scheduled for 12:30.

## 2020-09-07 NOTE — Assessment & Plan Note (Addendum)
Pain, warmth, swelling at R dorsal foot into distal lower leg. ?gout although no recent identified increased dietary purine intake. ?cellulitis - will cover for both with doxy and prednisone course. Check labs including urate, CBC, BMP. Fortunately recent venous US negative for DVT, not consistent with PE. Update with effect. Gout handout provided.

## 2020-09-07 NOTE — Telephone Encounter (Signed)
Seen at ER, neg [place] for DVT.  I don't see he was fully evaluated however.  Will see if we can see in office today.

## 2020-09-07 NOTE — Progress Notes (Signed)
This visit was conducted in person.  BP 140/62 (BP Location: Right Arm, Patient Position: Sitting, Cuff Size: Normal)   Pulse (!) 107   Temp 97.6 F (36.4 C) (Temporal)   Ht 5\' 9"  (1.753 m)   Wt 148 lb 3 oz (67.2 kg)   SpO2 96%   BMI 21.88 kg/m   Pulse Readings from Last 3 Encounters:  09/07/20 (!) 107  07/17/20 (!) 113  06/17/20 (!) 117   CC: right leg swelling  Subjective:    Patient ID: Parker Martinez, male    DOB: 03/05/1941, 79 y.o.   MRN: [id number]  HPI: Parker Martinez is a 79 y.o. male presenting on 09/07/2020 for Hospitalization Follow-up (Pt went to [facility] told they do not have doppler.  Seen at [facility] ED yesterday. Had venous doppler done. )   See yesterday visits and phone note for details. 1.5 wk h/o bilateral ankle swelling, then over last few days noted sudden acutely worsening R leg swelling associated with redness and pain (paresthesias to feet and lower legs). Waited 2 hours to be seen at [facility], told they could not help him so referred to [facility] ER where venous US reassuringly returned negative for blood clot. Treating pain with ibuprofen with benefit. Worst pain when he wakes up in the mornings. No h/o gout. Denies inciting trauma/injury or fall.   No hormonal therapy. No recent prolonged periods of immobility.  No prior h/o DVT.  No chest pain, new dyspnea, headaches. Occasional dizziness Known COPD on Breztri with benefit, regularly takes albuterol inhaler 3-4 times a day - on 2L overnight oxygen. Continued smoker <1/2 ppd (3-4 cig/day) Known prostate cancer s/p seed implant 2016 [name] at [facility])  No increase in beer, red or organ meats, shrimp recently.   No known diabetes history.  Alcohol - drinks 1 scotch at night time   RLE venous US at [facility] ER 09/06/2020 - negative for DVT.  Last ABI 06/2018 - indicating mod BLE arterial disease with abnormal TBI - released from [facility] care due to stable results.     Relevant past medical, surgical, family and  social history reviewed and updated as indicated. Interim medical history since our last visit reviewed. Allergies and medications reviewed and updated. Outpatient Medications Prior to Visit  Medication Sig Dispense Refill  . albuterol (VENTOLIN HFA) 108 (90 Base) MCG/ACT inhaler USE 2 PUFFS EVERY 6 HOURS AS NEEDED FOR WHEEZING 18 g 3  . alfuzosin (UROXATRAL) 10 MG 24 hr tablet Take 10 mg by mouth daily with breakfast.    . aspirin 81 MG tablet Take 81 mg by mouth every morning.     Marland Kitchen atorvastatin (LIPITOR) 40 MG tablet Take 1 tablet (40 mg total) by mouth daily. 90 tablet 3  . Budeson-Glycopyrrol-Formoterol (BREZTRI AEROSPHERE) 160-9-4.8 MCG/ACT AERO Inhale 2 puffs into the lungs in the morning and at bedtime. 2 g 0  . diphenhydrAMINE (BENADRYL) 25 MG tablet Take 25 mg by mouth every morning.    Marland Kitchen EPINEPHrine (EPIPEN 2-PAK) 0.3 mg/0.3 mL IJ SOAJ injection Inject 0.3 mLs (0.3 mg total) into the muscle as needed for anaphylaxis. 1 each 1  . fexofenadine (ALLEGRA) 180 MG tablet Take 180 mg by mouth daily.    Marland Kitchen ipratropium (ATROVENT) 0.06 % nasal spray PLACE 2 SPRAYS INTO BOTH NOSTRILS 4 (FOUR) TIMES DAILY AS NEEDED FOR RHINITIS. 15 mL 0  . montelukast (SINGULAIR) 10 MG tablet Take 1 tablet (10 mg total) by mouth at bedtime. 90 tablet 3  .  Multiple Vitamin (MULTIVITAMIN) tablet Take 1 tablet by mouth daily.    . pantoprazole (PROTONIX) 40 MG tablet TAKE 1 TABLET BY MOUTH 2 TIMES DAILY BEFORE A MEAL. 30 MINS BEFORE BREAKFAST AND SUPPER 180 tablet 0  . vitamin B-12 (CYANOCOBALAMIN) 500 MCG tablet Take 500 mcg by mouth every morning.     Marland Kitchen EPINEPHrine 0.3 mg/0.3 mL IJ SOAJ injection Inject 0.3 mg into the muscle as needed for anaphylaxis.     No facility-administered medications prior to visit.     Per HPI unless specifically indicated in ROS section below Review of Systems Objective:  BP 140/62 (BP Location: Right Arm, Patient Position: Sitting, Cuff Size: Normal)   Pulse (!) 107   Temp 97.6 F  (36.4 C) (Temporal)   Ht 5\' 9"  (1.753 m)   Wt 148 lb 3 oz (67.2 kg)   SpO2 96%   BMI 21.88 kg/m   Wt Readings from Last 3 Encounters:  09/07/20 148 lb 3 oz (67.2 kg)  07/17/20 150 lb (68 kg)  06/17/20 153 lb 6.4 oz (69.6 kg)      Physical Exam Vitals and nursing note reviewed.  Constitutional:      Appearance: Normal appearance. He is not ill-appearing.  Cardiovascular:     Rate and Rhythm: Regular rhythm. Tachycardia present.     Pulses: Normal pulses.     Heart sounds: Normal heart sounds. No murmur heard.   Pulmonary:     Effort: Pulmonary effort is normal. No respiratory distress.     Breath sounds: Wheezing and rhonchi present. No rales.     Comments: Coarse breath sounds throughout Musculoskeletal:        General: Tenderness present.     Right lower leg: Edema (1+) present.     Left lower leg: Edema (tr) present.     Comments:  L leg/foot overall stable, ankle ROM intact, mild edema present R leg/foot -  Tender edema to lower leg into foot predominant pain at 1st MTPJ and dorsal metatarsals. No pain with calcaneal squeeze. Mild warmth to foot.   Skin:    General: Skin is warm and dry.     Findings: Erythema present.     Comments: Mild erythema to R foot into lower leg  Neurological:     Mental Status: He is alert.  Psychiatric:        Mood and Affect: Mood normal.        Behavior: Behavior normal.       Lab Results  Component Value Date   CREATININE 0.78 10/17/2019   BUN 5 (L) 10/17/2019   NA 144 10/17/2019   K 4.1 10/17/2019   CL 103 10/17/2019   CO2 28 10/17/2019    Assessment & Plan:  This visit occurred during the SARS-CoV-2 public health emergency.  Safety protocols were in place, including screening questions prior to the visit, additional usage of staff PPE, and extensive cleaning of exam room while observing appropriate contact time as indicated for disinfecting solutions.   Problem List Items Addressed This Visit    Swelling of right foot -  Primary    Pain, warmth, swelling at R dorsal foot into distal lower leg. ?gout although no recent identified increased dietary purine intake. ?cellulitis - will cover for both with doxy and prednisone course. Check labs including urate, CBC, BMP. Fortunately recent venous US negative for DVT, not consistent with PE. Update with effect. Gout handout provided.       Relevant Orders   Basic  metabolic panel   CBC with Differential/Platelet   Uric acid   Smoker    Continue to encourage cessation.      COPD, severe ([place])   Relevant Medications   predniSONE (DELTASONE) 20 MG tablet    Other Visit Diagnoses    Need for immunization against influenza       Relevant Orders   Flu Vaccine QUAD High Dose(Fluad) (Completed)       Meds ordered this encounter  Medications  . predniSONE (DELTASONE) 20 MG tablet    Sig: Take two tablets daily for 4 days followed by one tablet daily for 4 days    Dispense:  12 tablet    Refill:  0  . doxycycline (VIBRA-TABS) 100 MG tablet    Sig: Take 1 tablet (100 mg total) by mouth 2 (two) times daily.    Dispense:  14 tablet    Refill:  0   Orders Placed This Encounter  Procedures  . Flu Vaccine QUAD High Dose(Fluad)  . Basic metabolic panel  . CBC with Differential/Platelet  . Uric acid    Patient instructions; Flu shot today  Labs today  I am suspicious for gout flare however do want to cover both this and infection while we get results back.  We will be in touch with results. Continue elevating leg, continue tylenol for pain.   Follow up plan: Return if symptoms worsen or fail to improve.  Ria Bush, MD

## 2020-09-07 NOTE — Assessment & Plan Note (Signed)
Continue to encourage cessation. 

## 2020-09-17 DIAGNOSIS — R0600 Dyspnea, unspecified: Secondary | ICD-10-CM | POA: Diagnosis not present

## 2020-09-20 ENCOUNTER — Telehealth: Payer: Self-pay

## 2020-09-20 NOTE — Telephone Encounter (Signed)
Pt left v/m that he had seen Dr Darnell Level and was to cb if condition did not improve and pt still having flair up with feet problems and pt has lost part of use of the rt foot; there is numbness in rt foot and front part of foot from ankle cannot raise foot. I called pt; pt still has hard swollen areas in both calves but rt calf is a lot worse. There is discoloration at couple of bites; pt noticed bite areas in the last wk with 2 more bites beng more recent; bites are not quite as big as a pencil eraser.No CP or SOB than usual with COPD. Pt sis not sure if these symptoms are part of gout or what should pt do. [facility].

## 2020-09-20 NOTE — Telephone Encounter (Addendum)
Pt said did not seem to have much improvement after taking prednisone and doxycyline and pt said he got message to discontinue the doxycycline. Pt thinks he took doxycyline for 2 days. Pt scheduled appt with Dr Einar Pheasant for 09/21/20 at 12:20. UC & ED precautions given and pt voiced understanding. Sending note to Dr Darnell Level and Dr Einar Pheasant.

## 2020-09-20 NOTE — Telephone Encounter (Signed)
Did he note any benefit with treatment (prednisone and doxycycline)?  Bites and weakness are new - recommend office visit tomorrow with next available provider.

## 2020-09-21 ENCOUNTER — Ambulatory Visit (INDEPENDENT_AMBULATORY_CARE_PROVIDER_SITE_OTHER): Payer: PPO | Admitting: Family Medicine

## 2020-09-21 ENCOUNTER — Other Ambulatory Visit: Payer: Self-pay

## 2020-09-21 ENCOUNTER — Encounter: Payer: Self-pay | Admitting: Family Medicine

## 2020-09-21 VITALS — BP 142/62 | HR 108 | Temp 97.7°F | Wt 148.5 lb

## 2020-09-21 DIAGNOSIS — M5136 Other intervertebral disc degeneration, lumbar region: Secondary | ICD-10-CM

## 2020-09-21 DIAGNOSIS — I739 Peripheral vascular disease, unspecified: Secondary | ICD-10-CM

## 2020-09-21 DIAGNOSIS — M21371 Foot drop, right foot: Secondary | ICD-10-CM | POA: Diagnosis not present

## 2020-09-21 NOTE — Assessment & Plan Note (Signed)
Unclear if this is related to foot drop. Pt feels pain is stable and no radiating symptoms. Though if not improving would consider repeat imaging

## 2020-09-21 NOTE — Assessment & Plan Note (Signed)
Pt with 4 day hx of foot drop. Hx of lumbar spine disease but negative straight leg raise and isolated at this point w/o worsening pain. Recent normal doppler and s/p prednisone for ankle pain with improvement. He has a hx of PVD and last imaged 2019. Given cool extremities and foot drop discussed repeat now, though pulses were good, to evaluate for progression of disease incase nerves are being impacted. Otherwise, advised therapy and if no improvement either repeat Lumbar MRI or EMG studies.

## 2020-09-21 NOTE — Progress Notes (Signed)
Subjective:     Parker Martinez is a 79 y.o. male presenting for Foot Pain (R numb x 1 week ) and calf pain (L swollen )     HPI   #Foot/ankle swelling - bilateral  - was hard - noticed bite marks which started with the swelling - since he saw Dr. Darnell Level - tingling and numbness in the right foot and now difficulty lifting the right foot - hx of discectomy in the past  - still with chronic back pain - red skin discoloration which is slightly better - still having swelling and tenderness in the right foot - symptoms on the left foot are better - endorses some "bite marks" which are "weepy" at times  Foot drop - started 4 days ago, getting worse   Review of Systems   Social History   Tobacco Use  Smoking Status Current Every Day Smoker  . Packs/day: 0.50  . Years: 60.00  . Pack years: 30.00  . Types: Cigarettes  Smokeless Tobacco Never Used  Tobacco Comment   4-5 cigs per day 10/20/2019        Objective:    BP Readings from Last 3 Encounters:  09/21/20 (!) 142/62  09/07/20 140/62  07/17/20 (!) 153/71   Wt Readings from Last 3 Encounters:  09/21/20 148 lb 8 oz (67.4 kg)  09/07/20 148 lb 3 oz (67.2 kg)  07/17/20 150 lb (68 kg)    BP (!) 142/62   Pulse (!) 108   Temp 97.7 F (36.5 C) (Temporal)   Wt 148 lb 8 oz (67.4 kg)   SpO2 98%   BMI 21.93 kg/m    Physical Exam Constitutional:      Appearance: Normal appearance. He is not ill-appearing or diaphoretic.  HENT:     Right Ear: External ear normal.     Left Ear: External ear normal.  Eyes:     General: No scleral icterus.    Extraocular Movements: Extraocular movements intact.     Conjunctiva/sclera: Conjunctivae normal.  Cardiovascular:     Rate and Rhythm: Normal rate and regular rhythm.     Pulses:          Dorsalis pedis pulses are 2+ on the right side and 2+ on the left side.       Posterior tibial pulses are 2+ on the right side and 2+ on the left side.     Comments: Cool right foot.  Trace b/l edema Pulmonary:     Effort: Pulmonary effort is normal.  Musculoskeletal:     Cervical back: Neck supple.     Comments: Limited right foot dorsiflexion - unable to fully flex against gravity. Unable to dorsiflex the right great toe. Normal plantar flexion. Normal left foot strength.   Right great toe w/o sensation to monofilament testing Back: no ttp, negative straight leg raise  Skin:    General: Skin is warm and dry.     Capillary Refill: Capillary refill takes less than 2 seconds.     Comments: Faint erythema b/l ankles. No warmth, improving per patient  Neurological:     Mental Status: He is alert. Mental status is at baseline.  Psychiatric:        Mood and Affect: Mood normal.           Assessment & Plan:   Problem List Items Addressed This Visit      Cardiovascular and Mediastinum   PVD (peripheral vascular disease) with claudication ([place])    Given  foot drop will repeat imaging. Consider vascular consult if worsening. Cont asa 81 mg daily and atorvastatin 40 mg      Relevant Orders   VAS [place] LOWER EXT ART SEG MULTI (SEGMENTALS & LE RAYNAUDS)     Musculoskeletal and Integument   DDD (degenerative disc disease), lumbar    Unclear if this is related to foot drop. Pt feels pain is stable and no radiating symptoms. Though if not improving would consider repeat imaging        Other   Right foot drop - Primary    Pt with 4 day hx of foot drop. Hx of lumbar spine disease but negative straight leg raise and isolated at this point w/o worsening pain. Recent normal doppler and s/p prednisone for ankle pain with improvement. He has a hx of PVD and last imaged 2019. Given cool extremities and foot drop discussed repeat now, though pulses were good, to evaluate for progression of disease incase nerves are being impacted. Otherwise, advised therapy and if no improvement either repeat Lumbar MRI or EMG studies.       Relevant Orders   VAS [place] LOWER EXT ART SEG MULTI  (SEGMENTALS & LE RAYNAUDS)   Ambulatory referral to Physical Therapy       Return if symptoms worsen or fail to improve.  Lesleigh Noe, MD  This visit occurred during the SARS-CoV-2 public health emergency.  Safety protocols were in place, including screening questions prior to the visit, additional usage of staff PPE, and extensive cleaning of exam room while observing appropriate contact time as indicated for disinfecting solutions.

## 2020-09-21 NOTE — Assessment & Plan Note (Signed)
Given foot drop will repeat imaging. Consider vascular consult if worsening. Cont asa 81 mg daily and atorvastatin 40 mg

## 2020-09-21 NOTE — Telephone Encounter (Signed)
See note from today

## 2020-09-21 NOTE — Patient Instructions (Signed)
You should get a phone call from physical therapy and from the imaging center about the study

## 2020-09-28 ENCOUNTER — Telehealth: Payer: Self-pay | Admitting: *Deleted

## 2020-09-28 NOTE — Telephone Encounter (Signed)
Routing to referral coordinator to change location for doppler to patient preference.

## 2020-09-28 NOTE — Telephone Encounter (Addendum)
Patient left a voicemail stating that he saw Dr. Einar Pheasant last week and a doppler of his legs was suppose to be ordered. Patient stated that referral was made with the wrong doctor. Patient stated that he had requested that a referral be made to Dr[facility] because he has seen him before.  Called patient to get more information. Patient stated that he is not sure who called him about an appointment because he was half asleep. Patient stated that he will call the number that he was given and confirm who and what is appointment is scheduled for. Patient stated that he will call back with this information.

## 2020-09-28 NOTE — Telephone Encounter (Signed)
Patient called back stating that the appointment he has scheduled is with [facility] on 10/11/20.  Patient stated if this is for a doppler he would like that cancelled and scheduled with Dr. Doren Custard at [facility] because he has seen them before. Advised patient that this message will go back to Dr. Einar Pheasant for her review.

## 2020-10-04 DIAGNOSIS — R3912 Poor urinary stream: Secondary | ICD-10-CM | POA: Diagnosis not present

## 2020-10-04 DIAGNOSIS — N401 Enlarged prostate with lower urinary tract symptoms: Secondary | ICD-10-CM | POA: Diagnosis not present

## 2020-10-04 DIAGNOSIS — R350 Frequency of micturition: Secondary | ICD-10-CM | POA: Diagnosis not present

## 2020-10-04 DIAGNOSIS — C61 Malignant neoplasm of prostate: Secondary | ICD-10-CM | POA: Diagnosis not present

## 2020-10-05 NOTE — Telephone Encounter (Signed)
Patient left a voicemail stating that he has been waiting to hear back regarding the appointment scheduled with [facility]. Patient stated if he does not hear anything back he will cancel the appointment with [facility]. Patient stated that he is also waiting to hear back about Rehab/PT.

## 2020-10-07 ENCOUNTER — Encounter: Payer: Self-pay | Admitting: Pulmonary Disease

## 2020-10-07 ENCOUNTER — Other Ambulatory Visit: Payer: Self-pay

## 2020-10-07 ENCOUNTER — Ambulatory Visit: Payer: PPO | Admitting: Pulmonary Disease

## 2020-10-07 VITALS — BP 100/60 | HR 87 | Temp 98.2°F | Ht 69.0 in | Wt 148.4 lb

## 2020-10-07 DIAGNOSIS — G4736 Sleep related hypoventilation in conditions classified elsewhere: Secondary | ICD-10-CM

## 2020-10-07 DIAGNOSIS — J439 Emphysema, unspecified: Secondary | ICD-10-CM

## 2020-10-07 DIAGNOSIS — J449 Chronic obstructive pulmonary disease, unspecified: Secondary | ICD-10-CM

## 2020-10-07 DIAGNOSIS — F17210 Nicotine dependence, cigarettes, uncomplicated: Secondary | ICD-10-CM

## 2020-10-07 MED ORDER — PREDNISONE 10 MG PO TABS
10.0000 mg | ORAL_TABLET | Freq: Every day | ORAL | 2 refills | Status: DC
Start: 2020-10-07 — End: 2020-10-28

## 2020-10-07 NOTE — Progress Notes (Signed)
dme

## 2020-10-07 NOTE — Patient Instructions (Signed)
We are going to switch her oxygen provider to [facility].  Continue Breztri 2 puffs twice a day, continue albuterol as needed.  Continue using your oxygen at nighttime.  We have sent a prescription for prednisone 10 mg take 1 daily.  I will see you in follow-up in 6 to 8 weeks time call sooner should any new problems arise.

## 2020-10-07 NOTE — Progress Notes (Signed)
Subjective:    Patient ID: Parker Martinez, male    DOB: 05/01/1941, 79 y.o.   MRN: [id number]   Patient ID: Parker Martinez, male    DOB: 10/30/1941, 79 y.o.   MRN: [id number] PULMONARY OFFICE FOLLOW-UP NOTE  Requesting MD/Service:Gutierrez Date of initial consultation:06/03/19by Dr. Merton Border Reason for consultation:Severe COPD, smoker  PT PROFILE: 79.o.maleformersmoker (previously > 1 PPD,with diagnosis of COPD rendered several years ago and with progressive DOE over past 10 years  DATA: 10/19/10 Spirometry:Severe obstruction (FEV1 41% predicted) 05/28/18 PFTs: FVC: 2.13 >2.96 L (58 >80 %pred), FEV1: 0.85 >1.04 L (30 >36 %pred), FEV1/FVC: 40 >35%, TLC: 7.90 L (128 %pred), DLCO 50 %pred. Flow volume curve consistent with severe obstruction 02/20/19 PFTs:FEV1 was 1.96 L or 48% predicted, FVC was 2.72 L or 95% predicted, FEV1/FVC was 38%. TLC was 131%, RV 165%, indicating hyperinflation and air trapping. Diffusion capacity was 48%. Findings consistent with severe obstruction and emphysema. 05/05/20 Overnight oximetry: Shows nocturnal desaturations and possible obstructive sleep apnea. Patient will need sleep study.  Nocturnal desaturations as low as 82%.  Patient declined sleep study 06/01/20 2D echo: LVEF 60 to 65%, no evidence of cor pulmonale, mild aortic sclerosis, diastolic dysfunction type I 06/17/2020 chest x-ray: No acute process, changes consistent with severe COPD, subcentimeter nodules in the upper lobes bilaterally, chronic, independently reviewed  INTERVAL: Last visit07/15/2021 withme.Unfortunately continues to smoke though has reduced to less than a half a pack per day.  No new issues.  HPI Presents for scheduled visit.  Continues to exhibit significant dyspnea.  Does note that Parker Martinez helps some but does not last all day.  He has to use albuterol frequently during the day.  No sputum production or hemoptysis.  Cough mostly in the mornings  with no change in character.  No chest pain, lower extremity edema or paroxysmal nocturnal dyspnea.  Has "good days and bad days".  Having difficulties with his DME company.  Wonders if he can switch DME providers.  He does not voice any other complaint today.  Dyspnea is exceedingly limiting.  He does note some improvement in his sleep with the oxygen.  We discussed that he is quite end-stage.  Not ready for palliative care.   Review of Systems A 10 point review of systems was performed and it is as noted above otherwise negative.   Patient Active Problem List   Diagnosis Date Noted  . Protein-calorie malnutrition ([place]) 11/04/2020  . Hypokalemia 11/04/2020  . Shortness of breath 10/26/2020  . Sepsis ([place]) 10/26/2020  . Tachycardia 10/20/2020  . Right foot drop 09/21/2020  . Swelling of right foot 09/07/2020  . Aorto-iliac atherosclerosis ([place]) 01/25/2019  . Left lumbar radiculopathy 01/24/2019  . Bilateral hearing loss 10/21/2018  . BPV (benign positional vertigo), right 09/09/2018  . Heart murmur 09/09/2018  . Encounter for chronic pain management 05/15/2017  . Chronic pain in left shoulder 01/30/2017  . Subclavian steal syndrome 10/12/2016  . Abnormal drug screen   . Atherosclerosis of native arteries of extremity with intermittent claudication ([place]) 10/21/2014  . Medicare annual wellness visit, subsequent 09/11/2014  . Carotid stenosis 09/11/2014  . Advanced care planning/counseling discussion 09/11/2014  . Vitamin D deficiency 09/01/2014  . DDD (degenerative disc disease), lumbar   . Personal history of colonic adenomas 09/02/2013  . COPD exacerbation ([place]) 03/04/2013  . Healthcare maintenance 04/25/2012  . Polycythemia secondary to smoking 04/25/2012  . Dyslipidemia 04/25/2012  . HTN (hypertension)   . PVD (peripheral vascular disease) with  claudication ([place])   . COPD, severe ([place])   . Prostate cancer ([place])   . GERD (gastroesophageal reflux disease)   . Idiopathic  anaphylaxis   . Smoker 09/13/2011  . Esophageal rings - lower esophagus 06/04/2009       Objective:   Physical Exam BP 100/60 (BP Location: Left Arm, Patient Position: Sitting, Cuff Size: Normal)   Pulse 87   Temp 98.2 F (36.8 C) (Temporal)   Ht 5\' 9"  (1.753 m)   Wt 148 lb 6.4 oz (67.3 kg)   SpO2 97%   BMI 21.91 kg/m  GENERAL: Disheveled appearing elderly male, no acute distress. Use of accessories, chronic. Presents in transport chair (due to dyspnea). HEAD: Normocephalic, atraumatic.  EYES: Pupils equal, round, reactive to light. No scleral icterus.  MOUTH: Nose/mouth/throat not examined due to masking requirements for COVID 19. NECK: Supple. No thyromegaly. Trachea midline. No JVD. No adenopathy. PULMONARY:Distant breath sounds, coarse breath sounds with wheezes throughout.  CARDIOVASCULAR: S1 and S2. Tachycardic rate with regular rhythm.Grade 1/6 to 2/6 systolic ejection murmur left sternal border. GASTROINTESTINAL: Benign. MUSCULOSKELETAL: No joint deformity, no clubbing, no edema.  NEUROLOGIC: No overt focal deficit noted. Speech is fluent. SKIN: No rashes, port wine nevus on middle finger left hand. PSYCH: Mood and behavior are normal.     Assessment & Plan:     ICD-10-CM   1. Stage 4 very severe COPD by GOLD classification ([place])  J44.9 AMB REFERRAL FOR DME    CANCELED: AMB REFERRAL FOR DME    CANCELED: AMB REFERRAL FOR DME   Continue Breztri 2 puffs twice a day Continue as needed albuterol Prednisone 10 mg daily  2. Nocturnal hypoxemia due to emphysema ([facility])  J43.9    G47.36    Continue oxygen at 2 L/min nocturnally He has been compliant Notes more restful sleep  3. Tobacco dependence due to cigarettes  F17.210    Patient was counseled regards to discontinuation of smoking   Orders Placed This Encounter  Procedures  . AMB REFERRAL FOR DME    Referral Priority:   Routine    Referral Type:   Durable Medical Equipment Purchase    Number of Visits  Requested:   1   Meds ordered this encounter  Medications  .  predniSONE (DELTASONE) 10 MG tablet    Sig: Take 1 tablet (10 mg total) by mouth daily with breakfast.    Dispense:  30 tablet    Refill:  2   We will see the patient in 6 to 8 weeks time, he is to call sooner should any new problems arise.  Renold Don, MD [facility]   *This note was dictated using voice recognition software/Dragon.  Despite best efforts to proofread, errors can occur which can change the meaning.  Any change was purely unintentional.

## 2020-10-07 NOTE — Telephone Encounter (Signed)
Following in referral work que  Patient requests his appt be changed to Dr. Scot Dock with [facility] for the  ABI W/WO TBI. First available with [facility] is December 2021.  Pt was last seen by Dr Scot Dock 2018, was supposed to have ABI done but cancelled at that time. Pt last seen July 2018. Last ABI July 2018.  Pt will have to re-establish with Dr Scot Dock   First available appt 11/17/20 with Dr Scot Dock - would need a new referral.   Pt can have ABI done at [facility] 11/8 as scheduled and then follow up with Dr Scot Dock for re-establish appt.  ------ After speaking with the patient, he is going to keep appt for 10/11/20 with [facility] for the ABI   He wants to schedule establish appt with Dr Scot Dock for December.   Referral sent to [facility] Dr Scot Dock to contact either our office to schedule or the patient.    ------  PT referral being handled as well. Working to get scheduled with [facility] within next week. Pt is aware.  Nothing further needed.

## 2020-10-11 ENCOUNTER — Ambulatory Visit (HOSPITAL_COMMUNITY)
Admission: RE | Admit: 2020-10-11 | Discharge: 2020-10-11 | Disposition: A | Discharge status: Home / Self Care | Payer: PPO | Source: Ambulatory Visit | Attending: Cardiology | Admitting: Cardiology

## 2020-10-11 ENCOUNTER — Other Ambulatory Visit: Payer: Self-pay | Admitting: Family Medicine

## 2020-10-11 ENCOUNTER — Other Ambulatory Visit: Payer: Self-pay

## 2020-10-11 DIAGNOSIS — M21371 Foot drop, right foot: Secondary | ICD-10-CM

## 2020-10-11 DIAGNOSIS — I739 Peripheral vascular disease, unspecified: Secondary | ICD-10-CM | POA: Insufficient documentation

## 2020-10-12 ENCOUNTER — Telehealth: Payer: Self-pay | Admitting: Pulmonary Disease

## 2020-10-12 DIAGNOSIS — J449 Chronic obstructive pulmonary disease, unspecified: Secondary | ICD-10-CM

## 2020-10-12 NOTE — Telephone Encounter (Signed)
Spoke to [place] with [employer], who stated that she needs CMN from [place] in order for patient to switch. If CMN can not be obtained, patient will need to be re qualified.  Rodena Piety, can you help with this?

## 2020-10-12 NOTE — Telephone Encounter (Signed)
That will be fine. 

## 2020-10-12 NOTE — Telephone Encounter (Signed)
Margie I spoke with [place] with [facility] and she stated that the patient has [employer] and they don't require a CMN.  I spoke with Bethanne Ginger with [employer] and she stated that they would need to do another ONO on [employer] for the patient to transfer the night time 02. Need new ONO order per Bethanne Ginger with [employer] [facility]

## 2020-10-12 NOTE — Telephone Encounter (Signed)
Lm for patient.  

## 2020-10-12 NOTE — Telephone Encounter (Signed)
Dr. [name], please advise if okay to order ONO on roomair to re qualify patient for oxygen. Patient is in the process of switching DME companies and this is needed to do so.

## 2020-10-13 NOTE — Telephone Encounter (Signed)
Patient is aware of need for ONO on roomair.  He is agreeable. Order has been placed. Nothing further needed.

## 2020-10-13 NOTE — Telephone Encounter (Signed)
Patient is returning phone call. Patient phone number is [phone].

## 2020-10-14 ENCOUNTER — Other Ambulatory Visit: Payer: Self-pay | Admitting: Pulmonary Disease

## 2020-10-17 ENCOUNTER — Other Ambulatory Visit: Payer: Self-pay | Admitting: Family Medicine

## 2020-10-17 ENCOUNTER — Telehealth: Payer: Self-pay | Admitting: Family Medicine

## 2020-10-17 DIAGNOSIS — C61 Malignant neoplasm of prostate: Secondary | ICD-10-CM

## 2020-10-17 DIAGNOSIS — I10 Essential (primary) hypertension: Secondary | ICD-10-CM

## 2020-10-17 DIAGNOSIS — E559 Vitamin D deficiency, unspecified: Secondary | ICD-10-CM

## 2020-10-17 DIAGNOSIS — E785 Hyperlipidemia, unspecified: Secondary | ICD-10-CM

## 2020-10-17 NOTE — Telephone Encounter (Signed)
Please call - how is foot pain/swelling/weakness? Has he been going to PT and is it helping? If persistent foot drop, may need re evaluation and/or imaging.

## 2020-10-18 DIAGNOSIS — R0600 Dyspnea, unspecified: Secondary | ICD-10-CM | POA: Diagnosis not present

## 2020-10-18 NOTE — Telephone Encounter (Signed)
Spoke with pt asking for update.  States he still has weakness, occasional pain, almost constant and some numbness.   Says he is going to PT and doing recommended exercises but is not seeing that it helps.  Also, he still has foot drop.  I relayed Dr. Synthia Innocent message.  Pt verbalizes understanding.

## 2020-10-18 NOTE — Telephone Encounter (Signed)
Lvm asking pt to call back.  Need to schedule OV to evaluate right foot drop.

## 2020-10-18 NOTE — Telephone Encounter (Addendum)
Pt has appt next week for AMW.  Would offer sooner OV this wk if pt wants to come in for evaluation of R foot drop.

## 2020-10-19 ENCOUNTER — Other Ambulatory Visit (INDEPENDENT_AMBULATORY_CARE_PROVIDER_SITE_OTHER): Payer: PPO

## 2020-10-19 ENCOUNTER — Other Ambulatory Visit: Payer: Self-pay

## 2020-10-19 DIAGNOSIS — E785 Hyperlipidemia, unspecified: Secondary | ICD-10-CM | POA: Diagnosis not present

## 2020-10-19 DIAGNOSIS — I10 Essential (primary) hypertension: Secondary | ICD-10-CM | POA: Diagnosis not present

## 2020-10-19 DIAGNOSIS — E559 Vitamin D deficiency, unspecified: Secondary | ICD-10-CM | POA: Diagnosis not present

## 2020-10-19 DIAGNOSIS — C61 Malignant neoplasm of prostate: Secondary | ICD-10-CM | POA: Diagnosis not present

## 2020-10-19 LAB — CBC WITH DIFFERENTIAL/PLATELET
Basophils Absolute: 0 10*3/uL (ref 0.0–0.1)
Basophils Relative: 0.4 % (ref 0.0–3.0)
Eosinophils Absolute: 0.2 10*3/uL (ref 0.0–0.7)
Eosinophils Relative: 2.2 % (ref 0.0–5.0)
HCT: 38.8 % — ABNORMAL LOW (ref 39.0–52.0)
Hemoglobin: 12.9 g/dL — ABNORMAL LOW (ref 13.0–17.0)
Lymphocytes Relative: 33.8 % (ref 12.0–46.0)
Lymphs Abs: 3.6 10*3/uL (ref 0.7–4.0)
MCHC: 33.3 g/dL (ref 30.0–36.0)
MCV: 95.1 fl (ref 78.0–100.0)
Monocytes Absolute: 0.9 10*3/uL (ref 0.1–1.0)
Monocytes Relative: 8.7 % (ref 3.0–12.0)
Neutro Abs: 5.8 10*3/uL (ref 1.4–7.7)
Neutrophils Relative %: 54.9 % (ref 43.0–77.0)
Platelets: 252 10*3/uL (ref 150.0–400.0)
RBC: 4.08 Mil/uL — ABNORMAL LOW (ref 4.22–5.81)
RDW: 13.9 % (ref 11.5–15.5)
WBC: 10.6 10*3/uL — ABNORMAL HIGH (ref 4.0–10.5)

## 2020-10-19 LAB — LIPID PANEL
Cholesterol: 173 mg/dL (ref 0–200)
HDL: 116.1 mg/dL (ref >39.00)
LDL Cholesterol: 41 mg/dL (ref 0–99)
NonHDL: 56.43
Total CHOL/HDL Ratio: 1
Triglycerides: 79 mg/dL (ref 0.0–149.0)
VLDL: 15.8 mg/dL (ref 0.0–40.0)

## 2020-10-19 LAB — COMPREHENSIVE METABOLIC PANEL
ALT: 20 U/L (ref 0–53)
AST: 20 U/L (ref 0–37)
Albumin: 3.6 g/dL (ref 3.5–5.2)
Alkaline Phosphatase: 60 U/L (ref 39–117)
BUN: 12 mg/dL (ref 6–23)
CO2: 31 mEq/L (ref 19–32)
Calcium: 9.4 mg/dL (ref 8.4–10.5)
Chloride: 104 mEq/L (ref 96–112)
Creatinine, Ser: 0.83 mg/dL (ref 0.40–1.50)
GFR: 82.73 mL/min (ref >60.00)
Glucose, Bld: 72 mg/dL (ref 70–99)
Potassium: 4.2 mEq/L (ref 3.5–5.1)
Sodium: 143 mEq/L (ref 135–145)
Total Bilirubin: 0.4 mg/dL (ref 0.2–1.2)
Total Protein: 5.6 g/dL — ABNORMAL LOW (ref 6.0–8.3)

## 2020-10-19 LAB — VITAMIN D 25 HYDROXY (VIT D DEFICIENCY, FRACTURES): VITD: 30.92 ng/mL (ref 30.00–100.00)

## 2020-10-19 LAB — MICROALBUMIN / CREATININE URINE RATIO
Creatinine,U: 202.1 mg/dL
Microalb Creat Ratio: 1 mg/g (ref 0.0–30.0)
Microalb, Ur: 2 mg/dL — ABNORMAL HIGH (ref 0.0–1.9)

## 2020-10-19 LAB — PSA: PSA: 0.01 ng/mL — ABNORMAL LOW (ref 0.10–4.00)

## 2020-10-19 NOTE — Telephone Encounter (Signed)
Patient returned Lisa's phone call. Patient stated that he would like to come in sooner to be seen for his foot drop. Patient scheduled an appointment for tomorrow 10/20/20 at 12:00 noon.

## 2020-10-20 ENCOUNTER — Ambulatory Visit (INDEPENDENT_AMBULATORY_CARE_PROVIDER_SITE_OTHER): Payer: PPO

## 2020-10-20 ENCOUNTER — Encounter: Payer: Self-pay | Admitting: Family Medicine

## 2020-10-20 ENCOUNTER — Ambulatory Visit (INDEPENDENT_AMBULATORY_CARE_PROVIDER_SITE_OTHER)
Admission: RE | Admit: 2020-10-20 | Discharge: 2020-10-20 | Disposition: A | Discharge status: Home / Self Care | Payer: PPO | Source: Ambulatory Visit | Attending: Family Medicine | Admitting: Family Medicine

## 2020-10-20 ENCOUNTER — Other Ambulatory Visit: Payer: Self-pay | Admitting: Pulmonary Disease

## 2020-10-20 ENCOUNTER — Ambulatory Visit (INDEPENDENT_AMBULATORY_CARE_PROVIDER_SITE_OTHER): Payer: PPO | Admitting: Family Medicine

## 2020-10-20 ENCOUNTER — Encounter: Payer: Self-pay | Admitting: Pulmonary Disease

## 2020-10-20 VITALS — BP 108/60 | HR 120 | Temp 97.8°F | Ht 69.0 in | Wt 147.1 lb

## 2020-10-20 DIAGNOSIS — I700 Atherosclerosis of aorta: Secondary | ICD-10-CM | POA: Diagnosis not present

## 2020-10-20 DIAGNOSIS — F17200 Nicotine dependence, unspecified, uncomplicated: Secondary | ICD-10-CM

## 2020-10-20 DIAGNOSIS — I708 Atherosclerosis of other arteries: Secondary | ICD-10-CM

## 2020-10-20 DIAGNOSIS — M21371 Foot drop, right foot: Secondary | ICD-10-CM

## 2020-10-20 DIAGNOSIS — M5116 Intervertebral disc disorders with radiculopathy, lumbar region: Secondary | ICD-10-CM | POA: Diagnosis not present

## 2020-10-20 DIAGNOSIS — Z122 Encounter for screening for malignant neoplasm of respiratory organs: Secondary | ICD-10-CM | POA: Diagnosis not present

## 2020-10-20 DIAGNOSIS — I10 Essential (primary) hypertension: Secondary | ICD-10-CM

## 2020-10-20 DIAGNOSIS — M419 Scoliosis, unspecified: Secondary | ICD-10-CM | POA: Diagnosis not present

## 2020-10-20 DIAGNOSIS — Z0000 Encounter for general adult medical examination without abnormal findings: Secondary | ICD-10-CM

## 2020-10-20 DIAGNOSIS — R000 Tachycardia, unspecified: Secondary | ICD-10-CM | POA: Diagnosis not present

## 2020-10-20 DIAGNOSIS — M4726 Other spondylosis with radiculopathy, lumbar region: Secondary | ICD-10-CM | POA: Diagnosis not present

## 2020-10-20 DIAGNOSIS — J449 Chronic obstructive pulmonary disease, unspecified: Secondary | ICD-10-CM | POA: Diagnosis not present

## 2020-10-20 DIAGNOSIS — M48061 Spinal stenosis, lumbar region without neurogenic claudication: Secondary | ICD-10-CM | POA: Diagnosis not present

## 2020-10-20 NOTE — Patient Instructions (Signed)
Parker Martinez , Thank you for taking time to come for your Medicare Wellness Visit. I appreciate your ongoing commitment to your health goals. Please review the following plan we discussed and let me know if I can assist you in the future.   Screening recommendations/referrals: Colonoscopy: declined Recommended yearly ophthalmology/optometry visit for glaucoma screening and checkup Recommended yearly dental visit for hygiene and checkup  Vaccinations: Influenza vaccine: Up to date, completed 09/07/2020, due 07/2021 Pneumococcal vaccine: Completed series Tdap vaccine: decline/insurance Shingles vaccine: due, check with your insurance regarding coverage/cost if interested    Covid-19: Completed series  Advanced directives: Please bring a copy of your POA (Power of Attorney) and/or Living Will to your next appointment.   Conditions/risks identified: hypertension, dyslipidemia  Next appointment: Follow up in one year for your annual wellness visit.   Preventive Care 80 Years and Older, Male Preventive care refers to lifestyle choices and visits with your health care provider that can promote health and wellness. What does preventive care include?  A yearly physical exam. This is also called an annual well check.  Dental exams once or twice a year.  Routine eye exams. Ask your health care provider how often you should have your eyes checked.  Personal lifestyle choices, including:  Daily care of your teeth and gums.  Regular physical activity.  Eating a healthy diet.  Avoiding tobacco and drug use.  Limiting alcohol use.  Practicing safe sex.  Taking low doses of aspirin every day.  Taking vitamin and mineral supplements as recommended by your health care provider. What happens during an annual well check? The services and screenings done by your health care provider during your annual well check will depend on your age, overall health, lifestyle risk factors, and family  history of disease. Counseling  Your health care provider may ask you questions about your:  Alcohol use.  Tobacco use.  Drug use.  Emotional well-being.  Home and relationship well-being.  Sexual activity.  Eating habits.  History of falls.  Memory and ability to understand (cognition).  Work and work Statistician. Screening  You may have the following tests or measurements:  Height, weight, and BMI.  Blood pressure.  Lipid and cholesterol levels. These may be checked every 5 years, or more frequently if you are over 37 years old.  Skin check.  Lung cancer screening. You may have this screening every year starting at age 15 if you have a 30-pack-year history of smoking and currently smoke or have quit within the past 15 years.  Fecal occult blood test (FOBT) of the stool. You may have this test every year starting at age 9.  Flexible sigmoidoscopy or colonoscopy. You may have a sigmoidoscopy every 5 years or a colonoscopy every 10 years starting at age 46.  Prostate cancer screening. Recommendations will vary depending on your family history and other risks.  Hepatitis C blood test.  Hepatitis B blood test.  Sexually transmitted disease (STD) testing.  Diabetes screening. This is done by checking your blood sugar (glucose) after you have not eaten for a while (fasting). You may have this done every 1-3 years.  Abdominal aortic aneurysm (AAA) screening. You may need this if you are a current or former smoker.  Osteoporosis. You may be screened starting at age 45 if you are at high risk. Talk with your health care provider about your test results, treatment options, and if necessary, the need for more tests. Vaccines  Your health care provider may recommend certain  vaccines, such as:  Influenza vaccine. This is recommended every year.  Tetanus, diphtheria, and acellular pertussis (Tdap, Td) vaccine. You may need a Td booster every 10 years.  Zoster vaccine.  You may need this after age 77.  Pneumococcal 13-valent conjugate (PCV13) vaccine. One dose is recommended after age 13.  Pneumococcal polysaccharide (PPSV23) vaccine. One dose is recommended after age 13. Talk to your health care provider about which screenings and vaccines you need and how often you need them. This information is not intended to replace advice given to you by your health care provider. Make sure you discuss any questions you have with your health care provider. Document Released: 12/17/2015 Document Revised: 08/09/2016 Document Reviewed: 09/21/2015 Elsevier Interactive Patient Education  2017 [place] Prevention in the Home Falls can cause injuries. They can happen to people of all ages. There are many things you can do to make your home safe and to help prevent falls. What can I do on the outside of my home?  Regularly fix the edges of walkways and driveways and fix any cracks.  Remove anything that might make you trip as you walk through a door, such as a raised step or threshold.  Trim any bushes or trees on the path to your home.  Use bright outdoor lighting.  Clear any walking paths of anything that might make someone trip, such as rocks or tools.  Regularly check to see if handrails are loose or broken. Make sure that both sides of any steps have handrails.  Any raised decks and porches should have guardrails on the edges.  Have any leaves, snow, or ice cleared regularly.  Use sand or salt on walking paths during winter.  Clean up any spills in your garage right away. This includes oil or grease spills. What can I do in the bathroom?  Use night lights.  Install grab bars by the toilet and in the tub and shower. Do not use towel bars as grab bars.  Use non-skid mats or decals in the tub or shower.  If you need to sit down in the shower, use a plastic, non-slip stool.  Keep the floor dry. Clean up any water that spills on the floor as soon  as it happens.  Remove soap buildup in the tub or shower regularly.  Attach bath mats securely with double-sided non-slip rug tape.  Do not have throw rugs and other things on the floor that can make you trip. What can I do in the bedroom?  Use night lights.  Make sure that you have a light by your bed that is easy to reach.  Do not use any sheets or blankets that are too big for your bed. They should not hang down onto the floor.  Have a firm chair that has side arms. You can use this for support while you get dressed.  Do not have throw rugs and other things on the floor that can make you trip. What can I do in the kitchen?  Clean up any spills right away.  Avoid walking on wet floors.  Keep items that you use a lot in easy-to-reach places.  If you need to reach something above you, use a strong step stool that has a grab bar.  Keep electrical cords out of the way.  Do not use floor polish or wax that makes floors slippery. If you must use wax, use non-skid floor wax.  Do not have throw rugs and other things  on the floor that can make you trip. What can I do with my stairs?  Do not leave any items on the stairs.  Make sure that there are handrails on both sides of the stairs and use them. Fix handrails that are broken or loose. Make sure that handrails are as long as the stairways.  Check any carpeting to make sure that it is firmly attached to the stairs. Fix any carpet that is loose or worn.  Avoid having throw rugs at the top or bottom of the stairs. If you do have throw rugs, attach them to the floor with carpet tape.  Make sure that you have a light switch at the top of the stairs and the bottom of the stairs. If you do not have them, ask someone to add them for you. What else can I do to help prevent falls?  Wear shoes that:  Do not have high heels.  Have rubber bottoms.  Are comfortable and fit you well.  Are closed at the toe. Do not wear sandals.  If  you use a stepladder:  Make sure that it is fully opened. Do not climb a closed stepladder.  Make sure that both sides of the stepladder are locked into place.  Ask someone to hold it for you, if possible.  Clearly mark and make sure that you can see:  Any grab bars or handrails.  First and last steps.  Where the edge of each step is.  Use tools that help you move around (mobility aids) if they are needed. These include:  Canes.  Walkers.  Scooters.  Crutches.  Turn on the lights when you go into a dark area. Replace any light bulbs as soon as they burn out.  Set up your furniture so you have a clear path. Avoid moving your furniture around.  If any of your floors are uneven, fix them.  If there are any pets around you, be aware of where they are.  Review your medicines with your doctor. Some medicines can make you feel dizzy. This can increase your chance of falling. Ask your doctor what other things that you can do to help prevent falls. This information is not intended to replace advice given to you by your health care provider. Make sure you discuss any questions you have with your health care provider. Document Released: 09/16/2009 Document Revised: 04/27/2016 Document Reviewed: 12/25/2014 Elsevier Interactive Patient Education  2017 [employer].

## 2020-10-20 NOTE — Progress Notes (Signed)
Subjective:   Parker Martinez is a 79 y.o. male who presents for Medicare Annual/Subsequent preventive examination.  Review of Systems: N/A      I connected with the patient today by telephone and verified that I am speaking with the correct person using two identifiers. Location patient: home Location nurse: work Persons participating in the telephone visit: patient, nurse.   I discussed the limitations, risks, security and privacy concerns of performing an evaluation and management service by telephone and the availability of in person appointments. I also discussed with the patient that there may be a patient responsible charge related to this service. The patient expressed understanding and verbally consented to this telephonic visit.        Cardiac Risk Factors include: advanced age (>24men, >41 women);male gender;hypertension;Other (see comment), Risk factor comments: dyslipidemia     Objective:    Today's Vitals   10/20/20 0941  PainSc: 5    There is no height or weight on file to calculate BMI.  Advanced Directives 10/20/2020 02/16/2020 01/26/2020 10/20/2019 02/20/2019 10/15/2018 08/01/2018  Does Patient Have a Medical Advance Directive? Yes Yes Yes Yes Yes Yes Yes  Type of Paramedic of [place];Living will [place];Living will [place];Living will [place];Living will [place];Living will [place];Living will [place];Living will  Does patient want to make changes to medical advance directive? - No - Patient declined - - No - Patient declined - -  Copy of [place] in Chart? No - copy requested Yes - validated most recent copy scanned in chart (See row information) - No - copy requested Yes - validated most recent copy scanned in chart (See row information) Yes - validated most recent copy scanned in chart (See row  information) -    Current Medications (verified) Outpatient Encounter Medications as of 10/20/2020  Medication Sig  . albuterol (VENTOLIN HFA) 108 (90 Base) MCG/ACT inhaler USE 2 PUFFS EVERY 6 HOURS AS NEEDED FOR WHEEZING  . alfuzosin (UROXATRAL) 10 MG 24 hr tablet Take 10 mg by mouth daily with breakfast.  . aspirin 81 MG tablet Take 81 mg by mouth every morning.   Marland Kitchen atorvastatin (LIPITOR) 40 MG tablet Take 1 tablet (40 mg total) by mouth daily.  . Budeson-Glycopyrrol-Formoterol (BREZTRI AEROSPHERE) 160-9-4.8 MCG/ACT AERO Inhale 2 puffs into the lungs in the morning and at bedtime.  . diphenhydrAMINE (BENADRYL) 25 MG tablet Take 25 mg by mouth every morning.  Marland Kitchen EPINEPHrine (EPIPEN 2-PAK) 0.3 mg/0.3 mL IJ SOAJ injection Inject 0.3 mLs (0.3 mg total) into the muscle as needed for anaphylaxis.  Marland Kitchen EPINEPHrine 0.3 mg/0.3 mL IJ SOAJ injection Inject 0.3 mg into the muscle as needed for anaphylaxis.   . fexofenadine (ALLEGRA) 180 MG tablet Take 180 mg by mouth daily.  Marland Kitchen ipratropium (ATROVENT) 0.06 % nasal spray PLACE 2 SPRAYS INTO BOTH NOSTRILS 4 (FOUR) TIMES DAILY AS NEEDED FOR RHINITIS.  Marland Kitchen montelukast (SINGULAIR) 10 MG tablet Take 1 tablet (10 mg total) by mouth at bedtime.  . Multiple Vitamin (MULTIVITAMIN) tablet Take 1 tablet by mouth daily.  . pantoprazole (PROTONIX) 40 MG tablet TAKE 1 TABLET BY MOUTH 2 TIMES DAILY BEFORE A MEAL. [place] AND SUPPER  . predniSONE (DELTASONE) 10 MG tablet Take 1 tablet (10 mg total) by mouth daily with breakfast.  . vitamin B-12 (CYANOCOBALAMIN) 500 MCG tablet Take 500 mcg by mouth every morning.  No facility-administered encounter medications on file as of 10/20/2020.    Allergies (verified) Flonase [fluticasone propionate]   History: Past Medical History:  Diagnosis Date  . Abnormal drug screen 10/2013, 03/2015   low Cr, +EtOH, neg hydrocodone (10/2013, again 03/2015)  . Allergic rhinitis   . Arthritis    lumbar and cervical spine   . Carotid stenosis 09/11/2014   40-59% bilat ICA stenosis, rpt 1 yr (09/2014)   . COPD (chronic obstructive pulmonary disease) (HCC)    severe, participated in [place] study  . DDD (degenerative disc disease)    lumbar (HNP R L3/4) s/p ESI, cervical  . Diverticulitis 2015   by CT  . Diverticulosis 2008   severe by colonoscopy and CT  . Duodenitis   . Esophageal stricture 2012   s/p dilation  . GERD (gastroesophageal reflux disease)   . Hemangioma    congenital left arm  . History of kidney stones remote  . HTN (hypertension)    borderline readings at home  . Idiopathic anaphylaxis    h/o recurrent anaphylaxis, unknown trigger, s/p eval at [place] [phone]), last episode 03/2011  . PAD (peripheral artery disease) ([place])   . Personal history of colonic adenomas 09/02/2013  . Prostate cancer [facility]) 2011   prostate seed implant (Dr. [name])  . Smoker   . Status post dilatation of esophageal stricture   . Subclavian steal syndrome 10/12/2016   Left subclavian steal, with 18 mmHg brachial artery pressure gradient (10/2016). If symptomatic, rec PV consult   Past Surgical History:  Procedure Laterality Date  . BALLOON DILATION N/A 10/02/2014   Procedure: BALLOON DILATION;  Surgeon: Gatha Mayer, MD;  Location: [facility];  Service: Endoscopy;  Laterality: N/A;  . CATARACT EXTRACTION W/PHACO Right 01/26/2020   Procedure: CATARACT EXTRACTION PHACO AND INTRAOCULAR LENS PLACEMENT (IOC) RIGHT 5.15  00:42.9;  Surgeon: [name], MD;  Location: [facility];  Service: Ophthalmology;  Laterality: Right;  . CATARACT EXTRACTION W/PHACO Left 02/16/2020   Procedure: CATARACT EXTRACTION PHACO AND INTRAOCULAR LENS PLACEMENT (IOC) LEFT 3.87  00:45.4;  Surgeon: [name], MD;  Location: [facility];  Service: Ophthalmology;  Laterality: Left;  . COLONOSCOPY  08/2013   4 tubular adenomas, severe divertic, rpt 3 yrs [name])  . COLONOSCOPY  10/2016   severe  diverticulosis, no f/u recommended [name])  . ESOPHAGOGASTRODUODENOSCOPY  10/2016   2 rings dilated [name])  . ESOPHAGOGASTRODUODENOSCOPY  01/2019   dilated esophageal webs [name])  . ESOPHAGOGASTRODUODENOSCOPY (EGD) WITH PROPOFOL N/A 10/02/2014   WNL, mild esophagitis; Gatha Mayer, MD  . INSERTION PROSTATE RADIATION SEED  01/2015   [name]  . LUMBAR MICRODISCECTOMY Left 03/2019   L L4/L5 [name])  . SHOULDER SURGERY  1961   due to chronic dislocation  . SPIROMETRY  10/2010   severe obstruction, FEV1 41%, ratio 0.46  . TONSILLECTOMY  1944 ?  . UPPER GASTROINTESTINAL ENDOSCOPY  06/08/2009   dilated esophageal stricture, duodenitis  . virtual colonoscopy  2007   [name], rpt rec 5 yrs   Family History  Problem Relation Age of Onset  . Lung cancer Father   . Hyperlipidemia Father   . Arthritis Mother   . Breast cancer Daughter   . Coronary artery disease Neg Hx   . Stroke Neg Hx   . Diabetes Neg Hx    Social History   Socioeconomic History  . Marital status: Married    Spouse name: Not on file  . Number  of children: 1  . Years of education: Not on file  . Highest education level: Not on file  Occupational History  . Occupation: retired    Fish farm manager: RETIRED  Tobacco Use  . Smoking status: Current Every Day Smoker    Packs/day: 1.00    Years: 60.00    Pack years: 60.00    Types: Cigarettes  . Smokeless tobacco: Never Used  . Tobacco comment: 0-1/2 ppd/ 10/07/20  Vaping Use  . Vaping Use: Never used  Substance and Sexual Activity  . Alcohol use: Yes    Alcohol/week: 14.0 standard drinks    Types: 14 Cans of beer per week    Comment: socially  . Drug use: No  . Sexual activity: Not on file  Other Topics Concern  . 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 Wife is Ladean Raya. "no extraordinary efforts" does not want feeding tube.      Tested negative for hydrocodone and positive for alcohol on UDS 10/2013.  Recommend Q6 mo testing and will need to discuss EtOH use at next visit.   Social Determinants of Health   Financial Resource Strain: Low Risk   . Difficulty of Paying Living Expenses: Not hard at all  Food Insecurity: No Food Insecurity  . Worried About Charity fundraiser in the Last Year: Never true  . Ran Out of Food in the Last Year: Never true  Transportation Needs: No Transportation Needs  . Lack of Transportation (Medical): No  . Lack of Transportation (Non-Medical): No  Physical Activity: Sufficiently Active  . Days of Exercise per Week: 7 days  . Minutes of Exercise per Session: 30 min  Stress: No Stress Concern Present  . Feeling of Stress : Not at all  Social Connections:   . Frequency of Communication with Friends and Family: Not on file  . Frequency of Social Gatherings with Friends and Family: Not on file  . Attends Religious Services: Not on file  . Active Member of Clubs or Organizations: Not on file  . Attends Archivist Meetings: Not on file  . Marital Status: Not on file    Tobacco Counseling Ready to quit: Not Answered Counseling given: Not Answered Comment: 0-1/2 ppd/ 10/07/20   Clinical Intake:  Pre-visit preparation completed: Yes  Pain : 0-10 Pain Score: 5  Pain Type: Chronic pain Pain Location: Back Pain Descriptors / Indicators: Aching Pain Onset: More than a month ago Pain Frequency: Intermittent     Nutritional Risks: None Diabetes: No  How often do you need to have someone help you when you read instructions, pamphlets, or other written materials from your doctor or pharmacy?: 1 - Never What is the last grade level you completed in school?: post graduate  Diabetic:  No Nutrition Risk Assessment:  Has the patient had any N/V/D within the last 2 months?  No  Does the patient have any non-healing wounds?  No  Has the patient had any unintentional weight loss or weight gain?  No   Diabetes:  Is the patient diabetic?  No  If diabetic, was a CBG obtained today?  N/A Did the patient bring in their glucometer from  home?  N/A How often do you monitor your CBG's? N/A.   Financial Strains and Diabetes Management:  Are you having any financial strains with the device, your supplies or your medication? N/A.  Does the patient want to be seen by Chronic Care Management for management of their diabetes?  N/A Would the patient like to be referred to a Nutritionist or for Diabetic Management?  N/A    Interpreter Needed?: No  Information entered by :: CJohnson, LPN   Activities of Daily Living In your present state of health, do you have any difficulty performing the following activities: 10/20/2020 02/16/2020  Hearing? Y N  Comment has tinnutis -  Vision? N N  Difficulty concentrating or making decisions? N N  Walking or climbing stairs? N N  Dressing or bathing? N N  Doing errands, shopping? N -  Preparing Food and eating ? N -  Using the Toilet? N -  In the past six months, have you accidently leaked urine? Y -  Comment does not wear protection -  Do you have problems with loss of bowel control? N -  Managing your Medications? N -  Managing your Finances? N -  Housekeeping or managing your Housekeeping? N -  Some recent data might be hidden    Patient Care Team: Ria Bush, MD as PCP - General (Family Medicine) [name], MD as Consulting Physician (Ophthalmology) Angelia Mould, MD as Consulting Physician (Vascular Surgery) Gatha Mayer, MD as Consulting Physician (Gastroenterology) Myrlene Broker, MD as Attending Physician (Urology) Wilhelmina Mcardle, MD (Inactive) as Consulting Physician (Pulmonary  Disease) Debbora Dus, [facility] as Pharmacist (Pharmacist)  Indicate any recent Medical Services you may have received from other than [facility] providers in the past year (date may be approximate).     Assessment:   This is a routine wellness examination for [place].  Hearing/Vision screen  Hearing Screening   125Hz  250Hz  500Hz  1000Hz  2000Hz  3000Hz  4000Hz  6000Hz  8000Hz   Right ear:           Left ear:           Vision Screening Comments: Patient gets annual eye exams  Dietary issues and exercise activities discussed: Current Exercise Habits: Home exercise routine, Type of exercise: stretching, Time (Minutes): 30, Frequency (Times/Week): 7, Weekly Exercise (Minutes/Week): 210, Intensity: Moderate, Exercise limited by: None identified  Goals    . Patient Stated     10/20/2019, I will maintain and continue medications as prescribed.     . Patient Stated     10/20/2020, I will continue to do stretching exercises everyday for about 30 minutes.     . Pharmacy Care Plan     Current Barriers:  . Chronic Disease Management support, education, and care coordination needs related to  hypertension, dyslipidemia, peripheral vascular disease, COPD, GERD, chronic pain, vitamin D deficiency  Pharmacist Clinical Goal(s):  [name] patient concerns about dizziness and instability. Recommend discontinuing Benadryl daily and keep on hand for allergic reactions. Also recommend self-checking blood pressure while lying down and again within 2-5 minutes of standing. Keep log of blood pressure changes. Patient to discuss alternatives to alfuzosin with his urologist, including tamsulosin (Flomax) to reduce side effects of dizziness and unsteadiness. . Recommend complete tobacco cessation. . Recommend 2 doses of Shingrix vaccine from local pharmacy.  Interventions: . Comprehensive medication review performed. Marland Kitchen Updated medication list for accuracy  Patient Self Care Activities:  . Self administers medications  as prescribed . Self monitors  blood pressure   Initial goal documentation      Depression Screen PHQ 2/9 Scores 10/20/2020 10/20/2019 10/15/2018 08/01/2018 10/09/2017 05/21/2017 09/20/2016  PHQ - 2 Score 0 0 0 0 0 0 0  PHQ- 9 Score 0 0 0 - 0 - -    Fall Risk Fall Risk  10/20/2020 10/20/2019 10/15/2018 10/09/2017 09/20/2016  Falls in the past year? 0 0 0 No No  Number falls in past yr: 0 0 - - -  Comment - - - - -  Injury with Fall? 0 0 - - -  Comment - - - - -  Risk for fall due to : Medication side effect Medication side effect - - -  Follow up Falls evaluation completed;Falls prevention discussed Falls evaluation completed;Falls prevention discussed - - -    Any stairs in or around the home? Yes  If so, are there any without handrails? No  Home free of loose throw rugs in walkways, pet beds, electrical cords, etc? Yes  Adequate lighting in your home to reduce risk of falls? Yes   ASSISTIVE DEVICES UTILIZED TO PREVENT FALLS:  Life alert? No  Use of a cane, walker or w/c? Yes  Grab bars in the bathroom? No  Shower chair or bench in shower? No  Elevated toilet seat or a handicapped toilet? No   TIMED UP AND GO:  Was the test performed? N/A, telephone visit .   Cognitive Function: MMSE - Mini Mental State Exam 10/20/2020 10/20/2019 10/15/2018 10/09/2017  Orientation to time 5 4 5 5   Orientation to Place 5 5 5 5   Registration 3 3 3 3   Attention/ Calculation 5 5 0 0  Recall 3 3 3 3   Language- name 2 objects - - 0 0  Language- repeat 1 1 1 1   Language- follow 3 step command - - 3 3  Language- read & follow direction - - 0 0  Write a sentence - - 0 0  Copy design - - 0 0  Total score - - 20 20  Mini Cog  Mini-Cog screen was completed. Maximum score is 22. A value of 0 denotes this part of the MMSE was not completed or the patient failed this part of the Mini-Cog screening.       Immunizations Immunization History  Administered Date(s) Administered  . Fluad  Quad(high Dose 65+) 09/09/2019, 09/07/2020  . Influenza,inj,Quad PF,6+ Mos 09/11/2014, 09/24/2015, 09/20/2016, 09/14/2017, 08/29/2018  . PFIZER SARS-COV-2 Vaccination 12/20/2019, 01/10/2020, 09/05/2020  . Pneumococcal Conjugate-13 09/11/2014  . Pneumococcal Polysaccharide-23 12/04/2009    TDAP status: Due, Education has been provided regarding the importance of this vaccine. Advised may receive this vaccine at local pharmacy or Health Dept. Aware to provide a copy of the vaccination record if obtained from local pharmacy or Health Dept. Verbalized acceptance and understanding. Flu Vaccine status: Up to date Pneumococcal vaccine status: Up to date Covid-19 vaccine status: Completed vaccines  Qualifies for Shingles Vaccine? Yes   Zostavax completed No   Shingrix Completed?: No.    Education has been provided regarding the importance of this vaccine. Patient has been advised to call insurance company to determine out of pocket expense if they have not yet received this vaccine. Advised may also receive vaccine at local pharmacy or Health Dept. Verbalized acceptance and understanding.  Screening Tests Health Maintenance  Topic Date Due  . COLONOSCOPY  10/20/2021 (Originally 10/31/2019)  . TETANUS/TDAP  10/20/2024 (Originally 05/31/1960)  . INFLUENZA VACCINE  Completed  . COVID-19 Vaccine  Completed  . PNA vac Low Risk Adult  Completed    Health Maintenance  There are no preventive care reminders to display for this patient.  Colorectal cancer screening: No longer required. Patient declined due to age  Lung Cancer Screening: (Low Dose CT Chest recommended if Age 15-80 years, 30 pack-year currently smoking OR have quit w/in 15 years.) does qualify.   Lung Cancer Screening Referral: ordered 10/20/2020  Additional Screening:  Hepatitis C Screening: does not qualify; Completed N/A  Vision Screening: Recommended annual ophthalmology exams for early detection of glaucoma and other  disorders of the eye. Is the patient up to date with their annual eye exam?  Yes  Who is the provider or what is the name of the office in which the patient attends annual eye exams? Dr. Caesar Chestnut If pt is not established with a provider, would they like to be referred to a provider to establish care? No .   Dental Screening: Recommended annual dental exams for proper oral hygiene  Community Resource Referral / Chronic Care Management: CRR required this visit?  No   CCM required this visit?  No      Plan:     I have personally reviewed and noted the following in the patient's chart:   . Medical and social history . Use of alcohol, tobacco or illicit drugs  . Current medications and supplements . Functional ability and status . Nutritional status . Physical activity . Advanced directives . List of other physicians . Hospitalizations, surgeries, and ER visits in previous 12 months . Vitals . Screenings to include cognitive, depression, and falls . Referrals and appointments  In addition, I have reviewed and discussed with patient certain preventive protocols, quality metrics, and best practice recommendations. A written personalized care plan for preventive services as well as general preventive health recommendations were provided to patient.   Due to this being a telephonic visit, the after visit summary with patients personalized plan was offered to patient via office or my-chart. Patient preferred to pick up at office at next visit or via mychart.   Andrez Grime, LPN   [phone]

## 2020-10-20 NOTE — Assessment & Plan Note (Signed)
New and persistent over the past month, with abnormal neurological exam. Just started PT course. Will proceed with lumbar imaging with films then contrasted MR (in h/o microdiscectomy 2020). Concern for HNP causing compression and resultant foot drop.  Severe COPD complicates surgical intervention.

## 2020-10-20 NOTE — Assessment & Plan Note (Signed)
Appreciate pulm care. Now on daily low dose prednisone.

## 2020-10-20 NOTE — Assessment & Plan Note (Signed)
Widely fluctuating recently, not on antihypertensives.

## 2020-10-20 NOTE — Patient Instructions (Addendum)
Ok to cancel vascular surgery evaluation.  I worry symptoms may be coming from the back - lumbar xrays today, then we may proceed with lumbar MRI.

## 2020-10-20 NOTE — Assessment & Plan Note (Signed)
Attributed to recent albuterol use.

## 2020-10-20 NOTE — Assessment & Plan Note (Signed)
Recent ABI stable to improved.  Will cancel VVS referral at this time.

## 2020-10-20 NOTE — Progress Notes (Signed)
PCP notes:  Health Maintenance: Colonoscopy- declined   Abnormal Screenings: none   Patient concerns: none   Nurse concerns: none   Next PCP appt.: 10/26/2020 @ 9:30 am

## 2020-10-20 NOTE — Progress Notes (Signed)
Patient ID: Parker Martinez, male    DOB: [birth year], 79 y.o.   MRN: [id number]  This visit was conducted in person.  BP 108/60 (BP Location: Left Arm, Patient Position: Sitting, Cuff Size: Normal)   Pulse (!) 120 Comment: After some water  Temp 97.8 F (36.6 C) (Temporal)   Ht 5\' 9"  (1.753 m)   Wt 147 lb 2 oz (66.7 kg)   SpO2 95%   BMI 21.73 kg/m   BP Readings from Last 3 Encounters:  10/20/20 108/60  10/07/20 100/60  09/21/20 (!) 142/62    Pulse Readings from Last 3 Encounters:  10/20/20 (!) 120  10/07/20 87  09/21/20 (!) 108   CC: R foot pain, numbness, weakness Subjective:   HPI: Parker Martinez is a 79 y.o. male presenting on 10/20/2020 for Foot Pain (C/o right foot pain, numbness, weakness and drop. )   See recent notes for details.  I saw patient last month with concern for gout flare r/o cellulitis - treated with prednisone taper and doxy course - without significant improvement.  09/17/2020 R foot drop developed associated with R dorsal foot numbness - saw Dr Einar Pheasant,  checked ABIs, started [facility] last week - planning to return to see PT this afternoon.  Recent ABIs actually showed improved readings - ok to cancel VVS eval at this time.   Denies saddle anesthesia, fevers/chills, bowel/bladder incontinence.   Intermittent lower back pain. Notes ongoing chronic R buttock pain without significant radiculopathy.  S/p L L4/5 lumbar microdiscectomy 03/2019 [name]) - L radiculopathy symptoms did initially improve, now have recurred.   Severe COPD on breztri, allegra, singulair and prednisone 10mg  daily.  Tachycardia noted - s/p albuterol inhaler 45 minutes ago.  BP has also been fluctuating - some hypotension recently. Not on BP meds.   RLE venous US at [facility] ER 09/06/2020 - negative for DVT.  Last ABI 06/2018 - indicating mod BLE arterial disease with abnormal TBI - released from VVS care due to stable results.     Relevant past medical, surgical, family and social  history reviewed and updated as indicated. Interim medical history since our last visit reviewed. Allergies and medications reviewed and updated. Outpatient Medications Prior to Visit  Medication Sig Dispense Refill  . albuterol (VENTOLIN HFA) 108 (90 Base) MCG/ACT inhaler USE 2 PUFFS EVERY 6 HOURS AS NEEDED FOR WHEEZING 18 g 3  . alfuzosin (UROXATRAL) 10 MG 24 hr tablet Take 10 mg by mouth daily with breakfast.    . aspirin 81 MG tablet Take 81 mg by mouth every morning.     Marland Kitchen atorvastatin (LIPITOR) 40 MG tablet Take 1 tablet (40 mg total) by mouth daily. 90 tablet 3  . BREZTRI AEROSPHERE 160-9-4.8 MCG/ACT AERO INHALE 2 PUFFS BY MOUTH TWICE A DAY 10.7 g 11  . diphenhydrAMINE (BENADRYL) 25 MG tablet Take 25 mg by mouth every morning.    Marland Kitchen EPINEPHrine (EPIPEN 2-PAK) 0.3 mg/0.3 mL IJ SOAJ injection Inject 0.3 mLs (0.3 mg total) into the muscle as needed for anaphylaxis. 1 each 1  . EPINEPHrine 0.3 mg/0.3 mL IJ SOAJ injection Inject 0.3 mg into the muscle as needed for anaphylaxis.     . fexofenadine (ALLEGRA) 180 MG tablet Take 180 mg by mouth daily.    Marland Kitchen ipratropium (ATROVENT) 0.06 % nasal spray PLACE 2 SPRAYS INTO BOTH NOSTRILS 4 (FOUR) TIMES DAILY AS NEEDED FOR RHINITIS. 15 mL 3  . montelukast (SINGULAIR) 10 MG tablet Take 1 tablet (10 mg  total) by mouth at bedtime. 90 tablet 3  . Multiple Vitamin (MULTIVITAMIN) tablet Take 1 tablet by mouth daily.    . pantoprazole (PROTONIX) 40 MG tablet TAKE 1 TABLET BY MOUTH 2 TIMES DAILY BEFORE A MEAL. 30 MINS BEFORE BREAKFAST AND SUPPER 180 tablet 0  . predniSONE (DELTASONE) 10 MG tablet Take 1 tablet (10 mg total) by mouth daily with breakfast. 30 tablet 2  . vitamin B-12 (CYANOCOBALAMIN) 500 MCG tablet Take 500 mcg by mouth every morning.      No facility-administered medications prior to visit.     Per HPI unless specifically indicated in ROS section below Review of Systems Objective:  BP 108/60 (BP Location: Left Arm, Patient Position: Sitting,  Cuff Size: Normal)   Pulse (!) 120 Comment: After some water  Temp 97.8 F (36.6 C) (Temporal)   Ht 5\' 9"  (1.753 m)   Wt 147 lb 2 oz (66.7 kg)   SpO2 95%   BMI 21.73 kg/m   Wt Readings from Last 3 Encounters:  10/20/20 147 lb 2 oz (66.7 kg)  10/07/20 148 lb 6.4 oz (67.3 kg)  09/21/20 148 lb 8 oz (67.4 kg)      Physical Exam Vitals and nursing note reviewed.  Constitutional:      Appearance: Normal appearance. He is not ill-appearing.  Musculoskeletal:        General: Tenderness (tender legs bilaterally) present. No swelling. Normal range of motion.     Right lower leg: No edema.     Left lower leg: No edema.     Comments:  1+ DP bilaterally No pain midline spine No paraspinous mm tenderness Neg SLR bilaterally.  Skin:    General: Skin is warm and dry.     Findings: No erythema or rash.  Neurological:     Mental Status: He is alert.     Motor: Weakness present.     Deep Tendon Reflexes:     Reflex Scores:      Patellar reflexes are 0 on the right side and 2+ on the left side.    Comments:  Walks with cane 5/5 strength BLE except for 3/5 strength R foot dorsiflexion Unable to heel walk, able to toe walk.        Results for orders placed or performed in visit on 10/19/20  CBC with Differential/Platelet  Result Value Ref Range   WBC 10.6 (H) 4.0 - 10.5 K/uL   RBC 4.08 (L) 4.22 - 5.81 Mil/uL   Hemoglobin 12.9 (L) 13.0 - 17.0 g/dL   HCT 38.8 (L) 39 - 52 %   MCV 95.1 78.0 - 100.0 fl   MCHC 33.3 30.0 - 36.0 g/dL   RDW 13.9 11.5 - 15.5 %   Platelets 252.0 150 - 400 K/uL   Neutrophils Relative % 54.9 43 - 77 %   Lymphocytes Relative 33.8 12 - 46 %   Monocytes Relative 8.7 3 - 12 %   Eosinophils Relative 2.2 0 - 5 %   Basophils Relative 0.4 0 - 3 %   Neutro Abs 5.8 1.4 - 7.7 K/uL   Lymphs Abs 3.6 0.7 - 4.0 K/uL   Monocytes Absolute 0.9 0.1 - 1.0 K/uL   Eosinophils Absolute 0.2 0.0 - 0.7 K/uL   Basophils Absolute 0.0 0.0 - 0.1 K/uL  PSA  Result Value Ref Range    PSA 0.01 (L) 0.10 - 4.00 ng/mL  Microalbumin / creatinine urine ratio  Result Value Ref Range   Microalb, Ur 2.0 (H)  0.0 - 1.9 mg/dL   Creatinine,U 202.1 mg/dL   Microalb Creat Ratio 1.0 0.0 - 30.0 mg/g  VITAMIN D 25 Hydroxy (Vit-D Deficiency, Fractures)  Result Value Ref Range   VITD 30.92 30.00 - 100.00 ng/mL  Comprehensive metabolic panel  Result Value Ref Range   Sodium 143 135 - 145 mEq/L   Potassium 4.2 3.5 - 5.1 mEq/L   Chloride 104 96 - 112 mEq/L   CO2 31 19 - 32 mEq/L   Glucose, Bld 72 70 - 99 mg/dL   BUN 12 6 - 23 mg/dL   Creatinine, Ser 0.83 0.40 - 1.50 mg/dL   Total Bilirubin 0.4 0.2 - 1.2 mg/dL   Alkaline Phosphatase 60 39 - 117 U/L   AST 20 0 - 37 U/L   ALT 20 0 - 53 U/L   Total Protein 5.6 (L) 6.0 - 8.3 g/dL   Albumin 3.6 3.5 - 5.2 g/dL   GFR 82.73 >60.00 mL/min   Calcium 9.4 8.4 - 10.5 mg/dL  Lipid panel  Result Value Ref Range   Cholesterol 173 0 - 200 mg/dL   Triglycerides 79.0 0 - 149 mg/dL   HDL 116.10 >39.00 mg/dL   VLDL 15.8 0.0 - 40.0 mg/dL   LDL Cholesterol 41 0 - 99 mg/dL   Total CHOL/HDL Ratio 1    NonHDL 56.43    LUMBAR MRI IMPRESSION 02/2019: 1. New L4-5 disc protrusion with severe left lateral recess stenosis and left L5 nerve root impingement. 2. Regression of L3-4 disc extrusion. Mild residual spinal and lateral recess stenosis. 3. Unchanged moderate bilateral neural foraminal stenosis at L5-S1. Electronically Signed   By: Logan Bores M.D.   On: 02/05/2019 16:16  Assessment & Plan:  This visit occurred during the SARS-CoV-2 public health emergency.  Safety protocols were in place, including screening questions prior to the visit, additional usage of staff PPE, and extensive cleaning of exam room while observing appropriate contact time as indicated for disinfecting solutions.   Problem List Items Addressed This Visit    Tachycardia    Attributed to recent albuterol use.       Right foot drop - Primary    New and persistent over the  past month, with abnormal neurological exam. Just started PT course. Will proceed with lumbar imaging with films then contrasted MR (in h/o microdiscectomy 2020). Concern for HNP causing compression and resultant foot drop.  Severe COPD complicates surgical intervention.       Relevant Orders   DG Lumbar Spine Complete   MR LUMBAR SPINE W CONTRAST   HTN (hypertension)    Widely fluctuating recently, not on antihypertensives.       COPD, severe ([place])    Appreciate pulm care. Now on daily low dose prednisone.       Aorto-iliac atherosclerosis (HCC)    Recent ABI stable to improved.  Will cancel VVS referral at this time.           No orders of the defined types were placed in this encounter.  Orders Placed This Encounter  Procedures  . DG Lumbar Spine Complete    Standing Status:   Future    Number of Occurrences:   1    Standing Expiration Date:   10/20/2021    Order Specific Question:   Reason for Exam (SYMPTOM  OR DIAGNOSIS REQUIRED)    Answer:   R foot drop, known lumbar radiculopathy    Order Specific Question:   Preferred imaging location?  Answer:   [facility]  . MR LUMBAR SPINE W CONTRAST    Standing Status:   Future    Standing Expiration Date:   10/20/2021    Order Specific Question:   If indicated for the ordered procedure, I authorize the administration of contrast media per Radiology protocol    Answer:   Yes    Order Specific Question:   What is the patient's sedation requirement?    Answer:   No Sedation    Order Specific Question:   Does the patient have a pacemaker or implanted devices?    Answer:   No    Order Specific Question:   Preferred imaging location?    Answer:   [facility] (table limit-350lbs)    Patient Instructions  Ok to cancel vascular surgery evaluation.  I worry symptoms may be coming from the back - lumbar xrays today, then we may proceed with lumbar MRI.   Follow up plan: Return if symptoms worsen or fail to  improve.  Ria Bush, MD

## 2020-10-25 DIAGNOSIS — J449 Chronic obstructive pulmonary disease, unspecified: Secondary | ICD-10-CM | POA: Diagnosis not present

## 2020-10-26 ENCOUNTER — Encounter: Payer: Self-pay | Admitting: Emergency Medicine

## 2020-10-26 ENCOUNTER — Ambulatory Visit
Admission: EM | Admit: 2020-10-26 | Discharge: 2020-10-26 | Disposition: A | Discharge status: Other Acute Inpatient Hospital | Payer: PPO

## 2020-10-26 ENCOUNTER — Emergency Department: Payer: PPO

## 2020-10-26 ENCOUNTER — Other Ambulatory Visit: Payer: Self-pay | Admitting: Family Medicine

## 2020-10-26 ENCOUNTER — Encounter: Payer: Self-pay | Admitting: Family Medicine

## 2020-10-26 ENCOUNTER — Other Ambulatory Visit: Payer: Self-pay

## 2020-10-26 ENCOUNTER — Telehealth (INDEPENDENT_AMBULATORY_CARE_PROVIDER_SITE_OTHER): Payer: PPO | Admitting: Family Medicine

## 2020-10-26 ENCOUNTER — Inpatient Hospital Stay
Admission: EM | Admit: 2020-10-26 | Discharge: 2020-10-28 | DRG: 698 | Disposition: A | Discharge status: Home / Self Care | Payer: PPO | Attending: Family Medicine | Admitting: Family Medicine

## 2020-10-26 VITALS — BP 97/61 | HR 131 | Temp 98.0°F | Ht 69.0 in | Wt 147.0 lb

## 2020-10-26 DIAGNOSIS — F17200 Nicotine dependence, unspecified, uncomplicated: Secondary | ICD-10-CM | POA: Diagnosis not present

## 2020-10-26 DIAGNOSIS — R000 Tachycardia, unspecified: Secondary | ICD-10-CM | POA: Diagnosis not present

## 2020-10-26 DIAGNOSIS — I739 Peripheral vascular disease, unspecified: Secondary | ICD-10-CM | POA: Diagnosis present

## 2020-10-26 DIAGNOSIS — Y848 Other medical procedures as the cause of abnormal reaction of the patient, or of later complication, without mention of misadventure at the time of the procedure: Secondary | ICD-10-CM | POA: Diagnosis present

## 2020-10-26 DIAGNOSIS — M5116 Intervertebral disc disorders with radiculopathy, lumbar region: Secondary | ICD-10-CM | POA: Diagnosis present

## 2020-10-26 DIAGNOSIS — Z20822 Contact with and (suspected) exposure to covid-19: Secondary | ICD-10-CM | POA: Diagnosis present

## 2020-10-26 DIAGNOSIS — Z66 Do not resuscitate: Secondary | ICD-10-CM | POA: Diagnosis not present

## 2020-10-26 DIAGNOSIS — H9193 Unspecified hearing loss, bilateral: Secondary | ICD-10-CM | POA: Diagnosis present

## 2020-10-26 DIAGNOSIS — Z7952 Long term (current) use of systemic steroids: Secondary | ICD-10-CM

## 2020-10-26 DIAGNOSIS — Z888 Allergy status to other drugs, medicaments and biological substances status: Secondary | ICD-10-CM

## 2020-10-26 DIAGNOSIS — I959 Hypotension, unspecified: Secondary | ICD-10-CM | POA: Diagnosis not present

## 2020-10-26 DIAGNOSIS — Z79899 Other long term (current) drug therapy: Secondary | ICD-10-CM

## 2020-10-26 DIAGNOSIS — R0602 Shortness of breath: Secondary | ICD-10-CM | POA: Insufficient documentation

## 2020-10-26 DIAGNOSIS — E872 Acidosis: Secondary | ICD-10-CM | POA: Diagnosis not present

## 2020-10-26 DIAGNOSIS — I10 Essential (primary) hypertension: Secondary | ICD-10-CM | POA: Diagnosis present

## 2020-10-26 DIAGNOSIS — R52 Pain, unspecified: Secondary | ICD-10-CM | POA: Diagnosis not present

## 2020-10-26 DIAGNOSIS — R509 Fever, unspecified: Secondary | ICD-10-CM | POA: Diagnosis not present

## 2020-10-26 DIAGNOSIS — F17210 Nicotine dependence, cigarettes, uncomplicated: Secondary | ICD-10-CM | POA: Diagnosis present

## 2020-10-26 DIAGNOSIS — A419 Sepsis, unspecified organism: Secondary | ICD-10-CM | POA: Diagnosis not present

## 2020-10-26 DIAGNOSIS — R5383 Other fatigue: Secondary | ICD-10-CM | POA: Diagnosis not present

## 2020-10-26 DIAGNOSIS — M5136 Other intervertebral disc degeneration, lumbar region: Secondary | ICD-10-CM | POA: Diagnosis present

## 2020-10-26 DIAGNOSIS — M21371 Foot drop, right foot: Secondary | ICD-10-CM | POA: Diagnosis present

## 2020-10-26 DIAGNOSIS — Z8546 Personal history of malignant neoplasm of prostate: Secondary | ICD-10-CM | POA: Diagnosis not present

## 2020-10-26 DIAGNOSIS — I6523 Occlusion and stenosis of bilateral carotid arteries: Secondary | ICD-10-CM | POA: Diagnosis not present

## 2020-10-26 DIAGNOSIS — K219 Gastro-esophageal reflux disease without esophagitis: Secondary | ICD-10-CM | POA: Diagnosis present

## 2020-10-26 DIAGNOSIS — R634 Abnormal weight loss: Secondary | ICD-10-CM | POA: Diagnosis not present

## 2020-10-26 DIAGNOSIS — N390 Urinary tract infection, site not specified: Secondary | ICD-10-CM | POA: Diagnosis present

## 2020-10-26 DIAGNOSIS — E785 Hyperlipidemia, unspecified: Secondary | ICD-10-CM | POA: Diagnosis not present

## 2020-10-26 DIAGNOSIS — J309 Allergic rhinitis, unspecified: Secondary | ICD-10-CM | POA: Diagnosis present

## 2020-10-26 DIAGNOSIS — J449 Chronic obstructive pulmonary disease, unspecified: Secondary | ICD-10-CM

## 2020-10-26 DIAGNOSIS — R54 Age-related physical debility: Secondary | ICD-10-CM | POA: Diagnosis not present

## 2020-10-26 DIAGNOSIS — R0603 Acute respiratory distress: Secondary | ICD-10-CM

## 2020-10-26 DIAGNOSIS — M51369 Other intervertebral disc degeneration, lumbar region without mention of lumbar back pain or lower extremity pain: Secondary | ICD-10-CM | POA: Diagnosis present

## 2020-10-26 DIAGNOSIS — N9989 Other postprocedural complications and disorders of genitourinary system: Principal | ICD-10-CM | POA: Diagnosis present

## 2020-10-26 DIAGNOSIS — J439 Emphysema, unspecified: Secondary | ICD-10-CM | POA: Diagnosis not present

## 2020-10-26 DIAGNOSIS — Z7982 Long term (current) use of aspirin: Secondary | ICD-10-CM | POA: Diagnosis not present

## 2020-10-26 DIAGNOSIS — R0682 Tachypnea, not elsewhere classified: Secondary | ICD-10-CM | POA: Diagnosis not present

## 2020-10-26 DIAGNOSIS — R5381 Other malaise: Secondary | ICD-10-CM

## 2020-10-26 DIAGNOSIS — Z716 Tobacco abuse counseling: Secondary | ICD-10-CM

## 2020-10-26 LAB — TROPONIN I (HIGH SENSITIVITY)
Troponin I (High Sensitivity): 22 ng/L — ABNORMAL HIGH (ref <18)
Troponin I (High Sensitivity): 18 ng/L — ABNORMAL HIGH (ref <18)

## 2020-10-26 LAB — CBC
HCT: 36.3 % — ABNORMAL LOW (ref 39.0–52.0)
Hemoglobin: 12.6 g/dL — ABNORMAL LOW (ref 13.0–17.0)
MCH: 32.5 pg (ref 26.0–34.0)
MCHC: 34.7 g/dL (ref 30.0–36.0)
MCV: 93.6 fL (ref 80.0–100.0)
Platelets: 191 10*3/uL (ref 150–400)
RBC: 3.88 MIL/uL — ABNORMAL LOW (ref 4.22–5.81)
RDW: 12.9 % (ref 11.5–15.5)
WBC: 13.2 10*3/uL — ABNORMAL HIGH (ref 4.0–10.5)
nRBC: 0 % (ref 0.0–0.2)

## 2020-10-26 LAB — BASIC METABOLIC PANEL
Anion gap: 11 (ref 5–15)
BUN: 19 mg/dL (ref 8–23)
CO2: 25 mmol/L (ref 22–32)
Calcium: 9 mg/dL (ref 8.9–10.3)
Chloride: 95 mmol/L — ABNORMAL LOW (ref 98–111)
Creatinine, Ser: 0.98 mg/dL (ref 0.61–1.24)
GFR, Estimated: >60 mL/min (ref >60)
Glucose, Bld: 112 mg/dL — ABNORMAL HIGH (ref 70–99)
Potassium: 3.7 mmol/L (ref 3.5–5.1)
Sodium: 131 mmol/L — ABNORMAL LOW (ref 135–145)

## 2020-10-26 LAB — RESP PANEL BY RT-PCR (FLU A&B, COVID) ARPGX2
Influenza A by PCR: NEGATIVE
Influenza B by PCR: NEGATIVE
SARS Coronavirus 2 by RT PCR: NEGATIVE

## 2020-10-26 LAB — URINALYSIS, COMPLETE (UACMP) WITH MICROSCOPIC
Bilirubin Urine: NEGATIVE
Glucose, UA: NEGATIVE mg/dL
Ketones, ur: NEGATIVE mg/dL
Nitrite: NEGATIVE
Protein, ur: 100 mg/dL — AB
RBC / HPF: >50 RBC/hpf — ABNORMAL HIGH (ref 0–5)
Specific Gravity, Urine: 1.013 (ref 1.005–1.030)
WBC, UA: >50 WBC/hpf — ABNORMAL HIGH (ref 0–5)
pH: 6 (ref 5.0–8.0)

## 2020-10-26 LAB — HEPATIC FUNCTION PANEL
ALT: 20 U/L (ref 0–44)
AST: 22 U/L (ref 15–41)
Albumin: 3.3 g/dL — ABNORMAL LOW (ref 3.5–5.0)
Alkaline Phosphatase: 58 U/L (ref 38–126)
Bilirubin, Direct: 0.2 mg/dL (ref 0.0–0.2)
Indirect Bilirubin: 1 mg/dL — ABNORMAL HIGH (ref 0.3–0.9)
Total Bilirubin: 1.2 mg/dL (ref 0.3–1.2)
Total Protein: 6.2 g/dL — ABNORMAL LOW (ref 6.5–8.1)

## 2020-10-26 LAB — PROTIME-INR
INR: 0.9 (ref 0.8–1.2)
Prothrombin Time: 11.4 seconds (ref 11.4–15.2)

## 2020-10-26 LAB — LACTIC ACID, PLASMA
Lactic Acid, Venous: 2 mmol/L (ref 0.5–1.9)
Lactic Acid, Venous: 1.6 mmol/L (ref 0.5–1.9)

## 2020-10-26 LAB — APTT: aPTT: 33 seconds (ref 24–36)

## 2020-10-26 LAB — TSH: TSH: 0.383 u[IU]/mL (ref 0.350–4.500)

## 2020-10-26 MED ORDER — ADULT MULTIVITAMIN W/MINERALS CH
1.0000 | ORAL_TABLET | Freq: Every day | ORAL | Status: DC
  Administered 2020-10-26 – 2020-10-28 (×3): 1 via ORAL
  Filled 2020-10-26 (×3): qty 1

## 2020-10-26 MED ORDER — BUDESONIDE 0.25 MG/2ML IN SUSP
0.2500 mg | Freq: Two times a day (BID) | RESPIRATORY_TRACT | Status: DC
  Administered 2020-10-27 – 2020-10-28 (×3): 0.25 mg via RESPIRATORY_TRACT
  Filled 2020-10-26 (×3): qty 2

## 2020-10-26 MED ORDER — ALBUTEROL SULFATE (2.5 MG/3ML) 0.083% IN NEBU: 2.5000 mg | INHALATION_SOLUTION | RESPIRATORY_TRACT | Status: DC | PRN

## 2020-10-26 MED ORDER — ENOXAPARIN SODIUM 40 MG/0.4ML ~~LOC~~ SOLN
40.0000 mg | SUBCUTANEOUS | Status: DC
  Administered 2020-10-26 – 2020-10-27 (×2): 40 mg via SUBCUTANEOUS
  Filled 2020-10-26 (×2): qty 0.4

## 2020-10-26 MED ORDER — IPRATROPIUM-ALBUTEROL 0.5-2.5 (3) MG/3ML IN SOLN: 3.0000 mL | Freq: Three times a day (TID) | RESPIRATORY_TRACT | Status: DC

## 2020-10-26 MED ORDER — IPRATROPIUM-ALBUTEROL 0.5-2.5 (3) MG/3ML IN SOLN
3.0000 mL | Freq: Three times a day (TID) | RESPIRATORY_TRACT | Status: DC
  Administered 2020-10-27 – 2020-10-28 (×4): 3 mL via RESPIRATORY_TRACT
  Filled 2020-10-26 (×5): qty 3

## 2020-10-26 MED ORDER — ONDANSETRON HCL 4 MG PO TABS: 4.0000 mg | ORAL_TABLET | Freq: Four times a day (QID) | ORAL | Status: DC | PRN

## 2020-10-26 MED ORDER — ATORVASTATIN CALCIUM 20 MG PO TABS
40.0000 mg | ORAL_TABLET | Freq: Every day | ORAL | Status: DC
  Administered 2020-10-28: 40 mg via ORAL
  Filled 2020-10-26: qty 2

## 2020-10-26 MED ORDER — SODIUM CHLORIDE 0.9 % IV SOLN
2.0000 g | INTRAVENOUS | Status: DC
  Administered 2020-10-26 – 2020-10-27 (×2): 2 g via INTRAVENOUS
  Filled 2020-10-26 (×3): qty 20

## 2020-10-26 MED ORDER — LACTATED RINGERS IV BOLUS (SEPSIS)
1000.0000 mL | Freq: Once | INTRAVENOUS | Status: AC
  Administered 2020-10-26: 1000 mL via INTRAVENOUS

## 2020-10-26 MED ORDER — UMECLIDINIUM BROMIDE 62.5 MCG/INH IN AEPB
1.0000 | INHALATION_SPRAY | Freq: Every day | RESPIRATORY_TRACT | Status: DC
  Administered 2020-10-27 – 2020-10-28 (×2): 1 via RESPIRATORY_TRACT
  Filled 2020-10-26: qty 7

## 2020-10-26 MED ORDER — ACETAMINOPHEN 650 MG RE SUPP: 325.0000 mg | Freq: Four times a day (QID) | RECTAL | Status: DC | PRN

## 2020-10-26 MED ORDER — IPRATROPIUM-ALBUTEROL 0.5-2.5 (3) MG/3ML IN SOLN: 3.0000 mL | Freq: Once | RESPIRATORY_TRACT | Status: DC

## 2020-10-26 MED ORDER — BUDESON-GLYCOPYRROL-FORMOTEROL 160-9-4.8 MCG/ACT IN AERO: 2.0000 | INHALATION_SPRAY | Freq: Two times a day (BID) | RESPIRATORY_TRACT | Status: DC

## 2020-10-26 MED ORDER — LACTATED RINGERS IV SOLN: INTRAVENOUS | Status: DC

## 2020-10-26 MED ORDER — CYANOCOBALAMIN 500 MCG PO TABS
500.0000 ug | ORAL_TABLET | ORAL | Status: DC
  Administered 2020-10-27 – 2020-10-28 (×2): 500 ug via ORAL
  Filled 2020-10-26 (×2): qty 1

## 2020-10-26 MED ORDER — ONDANSETRON HCL 4 MG/2ML IJ SOLN: 4.0000 mg | Freq: Four times a day (QID) | INTRAMUSCULAR | Status: DC | PRN

## 2020-10-26 MED ORDER — PREDNISONE 20 MG PO TABS: ORAL_TABLET | ORAL | 0 refills | Status: DC

## 2020-10-26 MED ORDER — ARFORMOTEROL TARTRATE 15 MCG/2ML IN NEBU
15.0000 ug | INHALATION_SOLUTION | Freq: Two times a day (BID) | RESPIRATORY_TRACT | Status: DC
  Administered 2020-10-27 – 2020-10-28 (×2): 15 ug via RESPIRATORY_TRACT
  Filled 2020-10-26 (×5): qty 2

## 2020-10-26 MED ORDER — LACTATED RINGERS IV BOLUS (SEPSIS)
250.0000 mL | Freq: Once | INTRAVENOUS | Status: AC
  Administered 2020-10-26: 250 mL via INTRAVENOUS

## 2020-10-26 MED ORDER — PANTOPRAZOLE SODIUM 40 MG PO TBEC
40.0000 mg | DELAYED_RELEASE_TABLET | Freq: Every day | ORAL | Status: DC
  Administered 2020-10-26 – 2020-10-28 (×3): 40 mg via ORAL
  Filled 2020-10-26 (×3): qty 1

## 2020-10-26 MED ORDER — ASPIRIN EC 81 MG PO TBEC
81.0000 mg | DELAYED_RELEASE_TABLET | ORAL | Status: DC
  Administered 2020-10-27 – 2020-10-28 (×2): 81 mg via ORAL
  Filled 2020-10-26 (×2): qty 1

## 2020-10-26 MED ORDER — ACETAMINOPHEN 325 MG PO TABS
325.0000 mg | ORAL_TABLET | Freq: Four times a day (QID) | ORAL | Status: DC | PRN
  Administered 2020-10-27 (×2): 325 mg via ORAL
  Filled 2020-10-26 (×3): qty 1

## 2020-10-26 MED ORDER — POLYETHYLENE GLYCOL 3350 17 G PO PACK: 17.0000 g | PACK | Freq: Every day | ORAL | Status: DC | PRN

## 2020-10-26 MED ORDER — SODIUM CHLORIDE 0.9 % IV SOLN
500.0000 mg | INTRAVENOUS | Status: DC
  Administered 2020-10-26: 500 mg via INTRAVENOUS
  Filled 2020-10-26: qty 500

## 2020-10-26 MED ORDER — ACETAMINOPHEN 500 MG PO TABS
1000.0000 mg | ORAL_TABLET | Freq: Once | ORAL | Status: AC
  Administered 2020-10-26: 1000 mg via ORAL
  Filled 2020-10-26: qty 2

## 2020-10-26 NOTE — ED Notes (Signed)
hospitalist at bedside

## 2020-10-26 NOTE — Discharge Instructions (Signed)
Go directly to the ER for

## 2020-10-26 NOTE — H&P (Signed)
History and Physical   MICHAELPAUL APO Martinez:[id number] DOB: 01-12-1941 DOA: 10/26/2020  PCP: Ria Bush, MD Outpatient Specialists: Vernard Gambles, [facility] and Dr. Vladimir Crofts, neurology, Deitra Mayo (vascular), Dr. Tresa Endo (urology) Patient coming from: Home via private vehicle  I have personally briefly reviewed patient's old medical records in [place].  Chief Concern: Generalized weakness  HPI: Parker Martinez is a 79 y.o. male with medical history significant for stage IV severe COPD, tobacco dependence, malignant neoplasm of the prostate (Gleason score 6, 3+3 adenocarcinoma), foot drop, hyperlipidemia, bilateral carotid stenosis, GERD presented to the emergency department for chief concerns of generalized weakness x3 days.  He endorses anorexia for 2 days and generalized weakness for 3 days. No new shortness of breath.  He endorses dysuria x5 days.  He reports he has tried to increase his p.o. intake with water, Amber in order to feel better.  He reports that this did not help and feel better.  In addition, he has decreased his cigarette smoking to approximately half pack per day.  He presented to urgent care today and was told that he had a fever of 101 and was sent to the emergency department.  He endorses headache that is dull, abdominal tenderness that started 11/22 4/21.  He endorses weakness in legs that is unchanged.   He denies vision changes, chills, nausea, vomiting, chest pain, diarrhea, blood in stool, hematuria.  He endorses approximately 16 to 18 pound weight loss in the last 24 months.  He reports his weight loss is not intentional.  Spouse at bedside reports that he has had poor p.o. intake in the last 2 years.  Social history: lives at home with spouse. Has a three year dog, hypoallergicen. Current tobacco user (1/2 ppd now, 1+ ppd formerly), daily etoh user (2 shots of scotch per day),   Chart review:   Prostate  adenocarcinoma with elevated PSA, status post 3-dimensional targeted transperineal biopsy on 11/03/2014.  40% of the left anterior base of Gleason score 3+4 adenocarcinoma in 10% of the left anterior apex 3+3.  Low dose brachytherapy on 01/08/15.  Recent cystoscopy on 10/04/2020  ED Course: Discussed with ED provider, admit for sepsis secondary to urinary tract infection in setting of recent cystoscopy  Review of Systems: As per HPI otherwise 10 point review of systems negative.  Assessment/Plan  Principal Problem:   Sepsis ([place]) Active Problems:   COPD, severe (HCC)   GERD (gastroesophageal reflux disease)   Dyslipidemia   DDD (degenerative disc disease), lumbar   Bilateral hearing loss   Sepsis-with hypotension, responsive to fluids -Suspect secondary to urinary source due to recent manipulation via cystoscopy -At bedside map improved to 77 -Elevated lactic acid, leukocytosis, fever, hypotension, sinus tachycardia with rate of 127 -Suspect secondary to urinary tract infection -Urine cultures pending -Status post ceftriaxone and azithromycin -Status post 2.250 LR bolus per ED -Continue ceftriaxone 2 g -No respiratory symptoms other than baseline cough and shortness of breath, no indications for azithromycin at this time therefore we will not continue -Follow blood cultures -LR IVF at 150 cc/h -Admit to inpatient with telemetry  Elevated high-sensitivity troponin-downtrending delta, denies chest pain, EKG shows sinus tachycardia with rate of 127, QTc 433 -Low clinical suspicion for ACS at this time as sinus tachycardia could be explained by sepsis -Therefore, cardiology was consulted  Severe COPD-resumed home medications and scheduled duo nebs 3 times daily  GERD-Protonix 40 mg daily  As needed meds: Ondansetron, MiraLAX, acetaminophen  Chart reviewed.   DVT prophylaxis: Enoxaparin Code Status: DNR Diet: Cardiac Family Communication: Updated spouse at bedside Disposition  Plan: Pending clinical course Consults called: None at this time Admission status: Inpatient with telemetry  Past Medical History:  Diagnosis Date  . Abnormal drug screen 10/2013, 03/2015   low Cr, +EtOH, neg hydrocodone (10/2013, again 03/2015)  . Allergic rhinitis   . Arthritis    lumbar and cervical spine  . Carotid stenosis 09/11/2014   40-59% bilat ICA stenosis, rpt 1 yr (09/2014)   . COPD (chronic obstructive pulmonary disease) (HCC)    severe, participated in [place] study  . DDD (degenerative disc disease)    lumbar (HNP R L3/4) s/p ESI, cervical  . Diverticulitis 2015   by CT  . Diverticulosis 2008   severe by colonoscopy and CT  . Duodenitis   . Esophageal stricture 2012   s/p dilation  . GERD (gastroesophageal reflux disease)   . Hemangioma    congenital left arm  . History of kidney stones remote  . HTN (hypertension)    borderline readings at home  . Idiopathic anaphylaxis    h/o recurrent anaphylaxis, unknown trigger, s/p eval at [place] [phone]), last episode 03/2011  . PAD (peripheral artery disease) ([place])   . Personal history of colonic adenomas 09/02/2013  . Prostate cancer [facility]) 2011   prostate seed implant (Dr. [name])  . Smoker   . Status post dilatation of esophageal stricture   . Subclavian steal syndrome 10/12/2016   Left subclavian steal, with 18 mmHg brachial artery pressure gradient (10/2016). If symptomatic, rec PV consult   Past Surgical History:  Procedure Laterality Date  . BALLOON DILATION N/A 10/02/2014   Procedure: BALLOON DILATION;  Surgeon: Gatha Mayer, MD;  Location: [facility];  Service: Endoscopy;  Laterality: N/A;  . CATARACT EXTRACTION W/PHACO Right 01/26/2020   Procedure: CATARACT EXTRACTION PHACO AND INTRAOCULAR LENS PLACEMENT (IOC) RIGHT 5.15  00:42.9;  Surgeon: [name], MD;  Location: [place];  Service: Ophthalmology;  Laterality: Right;  . CATARACT EXTRACTION W/PHACO Left 02/16/2020    Procedure: CATARACT EXTRACTION PHACO AND INTRAOCULAR LENS PLACEMENT (IOC) LEFT 3.87  00:45.4;  Surgeon: [name], MD;  Location: [place];  Service: Ophthalmology;  Laterality: Left;  . COLONOSCOPY  08/2013   4 tubular adenomas, severe divertic, rpt 3 yrs [name])  . COLONOSCOPY  10/2016   severe diverticulosis, no f/u recommended [name])  . ESOPHAGOGASTRODUODENOSCOPY  10/2016   2 rings dilated [name])  . ESOPHAGOGASTRODUODENOSCOPY  01/2019   dilated esophageal webs [name])  . ESOPHAGOGASTRODUODENOSCOPY (EGD) WITH PROPOFOL N/A 10/02/2014   WNL, mild esophagitis; Gatha Mayer, MD  . INSERTION PROSTATE RADIATION SEED  01/2015   [name]  . LUMBAR MICRODISCECTOMY Left 03/2019   L L4/L5 [name])  . SHOULDER SURGERY  1961   due to chronic dislocation  . SPIROMETRY  10/2010   severe obstruction, FEV1 41%, ratio 0.46  . TONSILLECTOMY  1944 ?  . UPPER GASTROINTESTINAL ENDOSCOPY  06/08/2009   dilated esophageal stricture, duodenitis  . virtual colonoscopy  2007   [name], rpt rec 5 yrs   Social History:  reports that he has been smoking cigarettes. He has a 60.00 pack-year smoking history. He has never used smokeless tobacco. He reports current alcohol use of about 14.0 standard drinks of alcohol per week. He reports that he does not use drugs.  Allergies  Allergen Reactions  . Flonase [Fluticasone Propionate] Other (See Comments)  Per pt, causes rebound effect    Family History  Problem Relation Age of Onset  . Lung cancer Father   . Hyperlipidemia Father   . Arthritis Mother   . Breast cancer Daughter   . Coronary artery disease Neg Hx   . Stroke Neg Hx   . Diabetes Neg Hx    Family history: Family history reviewed and not pertinent  Prior to Admission medications   Medication Sig Start Date End Date Taking? Authorizing Provider  albuterol (VENTOLIN HFA) 108 (90 Base) MCG/ACT inhaler USE 2 PUFFS EVERY 6 HOURS AS NEEDED FOR WHEEZING 08/19/20   Ria Bush, MD  alfuzosin (UROXATRAL) 10 MG 24 hr tablet Take 10 mg by mouth daily with breakfast.    [provider]  aspirin 81 MG tablet Take 81 mg by mouth every morning.     [provider]  atorvastatin (LIPITOR) 40 MG tablet Take 1 tablet (40 mg total) by mouth daily. 10/24/19   Ria Bush, MD  BREZTRI AEROSPHERE 160-9-4.8 MCG/ACT AERO INHALE 2 PUFFS BY MOUTH TWICE A DAY 10/20/20   Tyler Pita, MD  diphenhydrAMINE (BENADRYL) 25 MG tablet Take 25 mg by mouth every morning.    [provider]  EPINEPHrine (EPIPEN 2-PAK) 0.3 mg/0.3 mL IJ SOAJ injection Inject 0.3 mLs (0.3 mg total) into the muscle as needed for anaphylaxis. 07/17/20   Nance Pear, MD  EPINEPHrine 0.3 mg/0.3 mL IJ SOAJ injection Inject 0.3 mg into the muscle as needed for anaphylaxis.     [provider]  fexofenadine (ALLEGRA) 180 MG tablet Take 180 mg by mouth daily.    [provider]  ipratropium (ATROVENT) 0.06 % nasal spray PLACE 2 SPRAYS INTO BOTH NOSTRILS 4 (FOUR) TIMES DAILY AS NEEDED FOR RHINITIS. 10/14/20   Tyler Pita, MD  montelukast (SINGULAIR) 10 MG tablet Take 1 tablet (10 mg total) by mouth at bedtime. 10/24/19   Ria Bush, MD  Multiple Vitamin (MULTIVITAMIN) tablet Take 1 tablet by mouth daily.    [provider]  pantoprazole (PROTONIX) 40 MG tablet TAKE 1 TABLET BY MOUTH 2 TIMES DAILY BEFORE A MEAL. [place] SUPPER 09/06/20   Gatha Mayer, MD  predniSONE (DELTASONE) 10 MG tablet Take 1 tablet (10 mg total) by mouth daily with breakfast. 10/07/20   Tyler Pita, MD  predniSONE (DELTASONE) 20 MG tablet Take two tablets daily for 3 days followed by one tablet daily for 4 days 10/26/20 11/02/20  Ria Bush, MD  vitamin B-12 (CYANOCOBALAMIN) 500 MCG tablet Take 500 mcg by mouth every morning.     [provider]   Physical Exam: Vitals:   10/26/20 1230 10/26/20 1300 10/26/20 1330 10/26/20  1400  BP: 111/60 (!) 104/55 (!) 103/49 99/60  Pulse: (!) 101 (!) 137 (!) 102 (!) 105  Resp: 19 18 20  (!) 24  Temp:      TempSrc:      SpO2: 97% 99% 96% 96%  Weight:      Height:       Constitutional: appears cachectic and frail, NAD, calm, comfortable, bilateral temporal wasting Eyes: PERRL, lids and conjunctivae normal ENMT: Mucous membranes are moist. Posterior pharynx clear of any exudate or lesions. Poor dentition. Hearing appropriate Neck: normal, supple, no masses, no thyromegaly Respiratory: clear to auscultation bilaterally, no wheezing, no crackles. Normal respiratory effort. No accessory muscle use.  Cardiovascular: Regular rate and rhythm, no murmurs / rubs / gallops. No extremity edema. 2+ pedal  pulses. No carotid bruits.  Abdomen: no tenderness, no masses palpated, no hepatosplenomegaly. Bowel sounds positive.  Musculoskeletal: no clubbing / cyanosis. No joint deformity upper and lower extremities. Good ROM, no contractures, no atrophy. Normal muscle tone.  Skin: no rashes, lesions, ulcers. No induration Neurologic: Sensation intact. Strength 5/5 in all 4.  Psychiatric: Normal judgment and insight. Alert and oriented x 3. Normal mood.   EKG: Independently reviewed, showing sinus tachycardia, with a rate of 127, QTc 433  Chest x-ray on Admission: Personally reviewed and I agree with radiologist reading as below.  DG Chest Port 1 View  Result Date: 10/26/2020 CLINICAL DATA:  Question sepsis EXAM: PORTABLE CHEST 1 VIEW COMPARISON:  Portable exam 1215 hours compared to 06/17/2020 FINDINGS: Normal heart size, mediastinal contours, and pulmonary vascularity. Atherosclerotic calcification aorta. Emphysematous changes consistent with COPD. No acute infiltrate, pleural effusion, or pneumothorax. Small nodular focus at RIGHT upper lobe 6 mm diameter unchanged since 05/28/2018. No acute osseous findings. IMPRESSION: COPD changes. No acute abnormalities. Aortic Atherosclerosis  (ICD10-I70.0) and Emphysema (ICD10-J43.9). Electronically Signed   By: Lavonia Dana M.D.   On: 10/26/2020 12:39   Labs on Admission: I have personally reviewed following labs  CBC: Recent Labs  Lab 10/26/20 1112  WBC 13.2*  HGB 12.6*  HCT 36.3*  MCV 93.6  PLT 627   Basic Metabolic Panel: Recent Labs  Lab 10/26/20 1112  NA 131*  K 3.7  CL 95*  CO2 25  GLUCOSE 112*  BUN 19  CREATININE 0.98  CALCIUM 9.0   GFR: Estimated Creatinine Clearance: 56.7 mL/min (by C-G formula based on SCr of 0.98 mg/dL).  Liver Function Tests: Recent Labs  Lab 10/26/20 1157  AST 22  ALT 20  ALKPHOS 58  BILITOT 1.2  PROT 6.2*  ALBUMIN 3.3*   Coagulation Profile: Recent Labs  Lab 10/26/20 1157  INR 0.9   Urine analysis:    Component Value Date/Time   COLORURINE AMBER (A) 10/26/2020 1208   APPEARANCEUR CLOUDY (A) 10/26/2020 1208   LABSPEC 1.013 10/26/2020 1208   PHURINE 6.0 10/26/2020 1208   GLUCOSEU NEGATIVE 10/26/2020 1208   HGBUR MODERATE (A) 10/26/2020 1208   BILIRUBINUR NEGATIVE 10/26/2020 1208   BILIRUBINUR Small 04/16/2013 0931   KETONESUR NEGATIVE 10/26/2020 1208   PROTEINUR 100 (A) 10/26/2020 1208   UROBILINOGEN 0.2 04/16/2013 0931   NITRITE NEGATIVE 10/26/2020 1208   LEUKOCYTESUR LARGE (A) 10/26/2020 1208   Pace [address] N Aalijah Mims D.O. [facility]  If 12AM-7AM, please contact overnight-coverage provider If 7AM-7PM, please contact day coverage provider [email or web address]  10/26/2020, 4:11 PM

## 2020-10-26 NOTE — ED Triage Notes (Signed)
Pt reports did a tele-visit with his MD this am and was advised to come to the ED for evaluation and possible abx.

## 2020-10-26 NOTE — ED Provider Notes (Signed)
[facility] Emergency Department Provider Note  ____________________________________________   First MD Initiated Contact with Patient 10/26/20 1128     (approximate)  I have reviewed the triage vital signs and the nursing notes.   HISTORY  Chief Complaint Shortness of Breath and Weakness    HPI Parker Martinez is a 79 y.o. male  Here with generalized weakness. Pt reports 4-5 days of progressively worsening fatigue, weakness, and chills. Pt has been noticing significant increased urinary frequency over the past week, which is interfering with his appetite and ability to sleep. He had a cystoscopy a week ago and felt itw as related to that. Over the past 24 hours or so, he's had poor appetite, weakness, and lightheadedness with standing. He's had associated nausea, but no vomiting. No diarrhea. No chest pain. No other complaints. No flank pain. He has had some mild SOB but this is chronic. His chronic cough does not seem worse.        Past Medical History:  Diagnosis Date  . Abnormal drug screen 10/2013, 03/2015   low Cr, +EtOH, neg hydrocodone (10/2013, again 03/2015)  . Allergic rhinitis   . Arthritis    lumbar and cervical spine  . Carotid stenosis 09/11/2014   40-59% bilat ICA stenosis, rpt 1 yr (09/2014)   . COPD (chronic obstructive pulmonary disease) (HCC)    severe, participated in [place] study  . DDD (degenerative disc disease)    lumbar (HNP R L3/4) s/p ESI, cervical  . Diverticulitis 2015   by CT  . Diverticulosis 2008   severe by colonoscopy and CT  . Duodenitis   . Esophageal stricture 2012   s/p dilation  . GERD (gastroesophageal reflux disease)   . Hemangioma    congenital left arm  . History of kidney stones remote  . HTN (hypertension)    borderline readings at home  . Idiopathic anaphylaxis    h/o recurrent anaphylaxis, unknown trigger, s/p eval at [place] [phone]), last episode 03/2011  . PAD (peripheral  artery disease) ([place])   . Personal history of colonic adenomas 09/02/2013  . Prostate cancer [facility]) 2011   prostate seed implant (Dr. [name])  . Smoker   . Status post dilatation of esophageal stricture   . Subclavian steal syndrome 10/12/2016   Left subclavian steal, with 18 mmHg brachial artery pressure gradient (10/2016). If symptomatic, rec PV consult    Patient Active Problem List   Diagnosis Date Noted  . Shortness of breath 10/26/2020  . Sepsis ([place]) 10/26/2020  . Tachycardia 10/20/2020  . Right foot drop 09/21/2020  . Swelling of right foot 09/07/2020  . Aorto-iliac atherosclerosis ([place]) 01/25/2019  . Left lumbar radiculopathy 01/24/2019  . Bilateral hearing loss 10/21/2018  . BPV (benign positional vertigo), right 09/09/2018  . Heart murmur 09/09/2018  . Encounter for chronic pain management 05/15/2017  . Chronic pain in left shoulder 01/30/2017  . Subclavian steal syndrome 10/12/2016  . Abnormal drug screen   . Atherosclerosis of native arteries of extremity with intermittent claudication ([place]) 10/21/2014  . Medicare annual wellness visit, subsequent 09/11/2014  . Carotid stenosis 09/11/2014  . Advanced care planning/counseling discussion 09/11/2014  . Vitamin D deficiency 09/01/2014  . DDD (degenerative disc disease), lumbar   . Personal history of colonic adenomas 09/02/2013  . COPD exacerbation ([employer]) 03/04/2013  . Healthcare maintenance 04/25/2012  . Polycythemia secondary to smoking 04/25/2012  . Dyslipidemia 04/25/2012  . HTN (hypertension)   . PVD (peripheral vascular disease)  with claudication ([place])   . COPD, severe ([place])   . Prostate cancer ([place])   . GERD (gastroesophageal reflux disease)   . Idiopathic anaphylaxis   . Smoker 09/13/2011  . Esophageal rings - lower esophagus 06/04/2009    Past Surgical History:  Procedure Laterality Date  . BALLOON DILATION N/A 10/02/2014   Procedure: BALLOON DILATION;  Surgeon: Gatha Mayer, MD;  Location: [facility]  ENDOSCOPY;  Service: Endoscopy;  Laterality: N/A;  . CATARACT EXTRACTION W/PHACO Right 01/26/2020   Procedure: CATARACT EXTRACTION PHACO AND INTRAOCULAR LENS PLACEMENT (IOC) RIGHT 5.15  00:42.9;  Surgeon: [name], MD;  Location: [place];  Service: Ophthalmology;  Laterality: Right;  . CATARACT EXTRACTION W/PHACO Left 02/16/2020   Procedure: CATARACT EXTRACTION PHACO AND INTRAOCULAR LENS PLACEMENT (IOC) LEFT 3.87  00:45.4;  Surgeon: [name], MD;  Location: [place];  Service: Ophthalmology;  Laterality: Left;  . COLONOSCOPY  08/2013   4 tubular adenomas, severe divertic, rpt 3 yrs [name])  . COLONOSCOPY  10/2016   severe diverticulosis, no f/u recommended [name])  . ESOPHAGOGASTRODUODENOSCOPY  10/2016   2 rings dilated [name])  . ESOPHAGOGASTRODUODENOSCOPY  01/2019   dilated esophageal webs [name])  . ESOPHAGOGASTRODUODENOSCOPY (EGD) WITH PROPOFOL N/A 10/02/2014   WNL, mild esophagitis; Gatha Mayer, MD  . INSERTION PROSTATE RADIATION SEED  01/2015   [name]  . LUMBAR MICRODISCECTOMY Left 03/2019   L L4/L5 [name])  . SHOULDER SURGERY  1961   due to chronic dislocation  . SPIROMETRY  10/2010   severe obstruction, FEV1 41%, ratio 0.46  . TONSILLECTOMY  1944 ?  . UPPER GASTROINTESTINAL ENDOSCOPY  06/08/2009   dilated esophageal stricture, duodenitis  . virtual colonoscopy  2007   [name], rpt rec 5 yrs    Prior to Admission medications   Medication Sig Start Date End Date Taking? Authorizing Provider  albuterol (VENTOLIN HFA) 108 (90 Base) MCG/ACT inhaler USE 2 PUFFS EVERY 6 HOURS AS NEEDED FOR WHEEZING 08/19/20   Ria Bush, MD  alfuzosin (UROXATRAL) 10 MG 24 hr tablet Take 10 mg by mouth daily with breakfast.    [provider]  aspirin 81 MG tablet Take 81 mg by mouth every morning.     [provider]  atorvastatin (LIPITOR) 40 MG tablet Take 1 tablet (40 mg total) by mouth daily. 10/24/19   Ria Bush,  MD  BREZTRI AEROSPHERE 160-9-4.8 MCG/ACT AERO INHALE 2 PUFFS BY MOUTH TWICE A DAY 10/20/20   Tyler Pita, MD  diphenhydrAMINE (BENADRYL) 25 MG tablet Take 25 mg by mouth every morning.    [provider]  EPINEPHrine (EPIPEN 2-PAK) 0.3 mg/0.3 mL IJ SOAJ injection Inject 0.3 mLs (0.3 mg total) into the muscle as needed for anaphylaxis. 07/17/20   Nance Pear, MD  EPINEPHrine 0.3 mg/0.3 mL IJ SOAJ injection Inject 0.3 mg into the muscle as needed for anaphylaxis.     [provider]  fexofenadine (ALLEGRA) 180 MG tablet Take 180 mg by mouth daily.    [provider]  ipratropium (ATROVENT) 0.06 % nasal spray PLACE 2 SPRAYS INTO BOTH NOSTRILS 4 (FOUR) TIMES DAILY AS NEEDED FOR RHINITIS. 10/14/20   Tyler Pita, MD  montelukast (SINGULAIR) 10 MG tablet Take 1 tablet (10 mg total) by mouth at bedtime. 10/24/19   Ria Bush, MD  Multiple Vitamin (MULTIVITAMIN) tablet Take 1 tablet by mouth daily.    [provider]  pantoprazole (PROTONIX) 40 MG tablet TAKE 1 TABLET BY MOUTH 2  TIMES DAILY BEFORE A MEAL. [place] SUPPER 09/06/20   Gatha Mayer, MD  predniSONE (DELTASONE) 10 MG tablet Take 1 tablet (10 mg total) by mouth daily with breakfast. 10/07/20   Tyler Pita, MD  predniSONE (DELTASONE) 20 MG tablet Take two tablets daily for 3 days followed by one tablet daily for 4 days 10/26/20 11/02/20  Ria Bush, MD  vitamin B-12 (CYANOCOBALAMIN) 500 MCG tablet Take 500 mcg by mouth every morning.     [provider]    Allergies Flonase [fluticasone propionate]  Family History  Problem Relation Age of Onset  . Lung cancer Father   . Hyperlipidemia Father   . Arthritis Mother   . Breast cancer Daughter   . Coronary artery disease Neg Hx   . Stroke Neg Hx   . Diabetes Neg Hx     Social History Social History   Tobacco Use  . Smoking status: Current Every Day Smoker    Packs/day: 1.00    Years:  60.00    Pack years: 60.00    Types: Cigarettes  . Smokeless tobacco: Never Used  . Tobacco comment: 0-1/2 ppd/ 10/07/20  Vaping Use  . Vaping Use: Never used  Substance Use Topics  . Alcohol use: Yes    Alcohol/week: 14.0 standard drinks    Types: 14 Cans of beer per week    Comment: socially  . Drug use: No    Review of Systems  Review of Systems  Constitutional: Positive for chills and fatigue. Negative for fever.  HENT: Negative for sore throat.   Respiratory: Negative for shortness of breath.   Cardiovascular: Negative for chest pain.  Gastrointestinal: Positive for nausea. Negative for abdominal pain.  Genitourinary: Negative for flank pain.  Musculoskeletal: Negative for neck pain.  Skin: Negative for rash and wound.  Allergic/Immunologic: Negative for immunocompromised state.  Neurological: Positive for weakness and light-headedness. Negative for numbness.  Hematological: Does not bruise/bleed easily.  All other systems reviewed and are negative.    ____________________________________________  PHYSICAL EXAM:      VITAL SIGNS: ED Triage Vitals  Enc Vitals Group     BP 10/26/20 1058 (!) 112/100     Pulse Rate 10/26/20 1058 (!) 126     Resp 10/26/20 1058 (!) 22     Temp 10/26/20 1106 99.7 F (37.6 C)     Temp Source 10/26/20 1106 Oral     SpO2 10/26/20 1058 94 %     Weight 10/26/20 1059 147 lb (66.7 kg)     Height 10/26/20 1059 5\' 9"  (1.753 m)     Head Circumference --      Peak Flow --      Pain Score 10/26/20 1059 8     Pain Loc --      Pain Edu? --      Excl. in [place]? --      Physical Exam Vitals and nursing note reviewed.  Constitutional:      General: He is not in acute distress.    Appearance: He is well-developed.  HENT:     Head: Normocephalic and atraumatic.     Mouth/Throat:     Comments: Dry MM Eyes:     Conjunctiva/sclera: Conjunctivae normal.  Cardiovascular:     Rate and Rhythm: Regular rhythm. Tachycardia present.     Heart  sounds: Normal heart sounds. No murmur heard.  No friction rub.  Pulmonary:     Effort: Pulmonary effort is normal. No  respiratory distress.     Breath sounds: Wheezing (slight, expiratory) present. No rales.  Abdominal:     General: There is no distension.     Palpations: Abdomen is soft.     Tenderness: There is no abdominal tenderness.  Musculoskeletal:     Cervical back: Neck supple.  Skin:    General: Skin is warm.     Capillary Refill: Capillary refill takes less than 2 seconds.  Neurological:     Mental Status: He is alert and oriented to person, place, and time.     Motor: No abnormal muscle tone.       ____________________________________________   LABS (all labs ordered are listed, but only abnormal results are displayed)  Labs Reviewed  BASIC METABOLIC PANEL - Abnormal; Notable for the following components:      Result Value   Sodium 131 (*)    Chloride 95 (*)    Glucose, Bld 112 (*)    All other components within normal limits  CBC - Abnormal; Notable for the following components:   WBC 13.2 (*)    RBC 3.88 (*)    Hemoglobin 12.6 (*)    HCT 36.3 (*)    All other components within normal limits  LACTIC ACID, PLASMA - Abnormal; Notable for the following components:   Lactic Acid, Venous 2.0 (*)    All other components within normal limits  URINALYSIS, COMPLETE (UACMP) WITH MICROSCOPIC - Abnormal; Notable for the following components:   Color, Urine AMBER (*)    APPearance CLOUDY (*)    Hgb urine dipstick MODERATE (*)    Protein, ur 100 (*)    Leukocytes,Ua LARGE (*)    RBC / HPF >50 (*)    WBC, UA >50 (*)    Bacteria, UA RARE (*)    All other components within normal limits  HEPATIC FUNCTION PANEL - Abnormal; Notable for the following components:   Total Protein 6.2 (*)    Albumin 3.3 (*)    Indirect Bilirubin 1.0 (*)    All other components within normal limits  TROPONIN I (HIGH SENSITIVITY) - Abnormal; Notable for the following components:    Troponin I (High Sensitivity) 22 (*)    All other components within normal limits  TROPONIN I (HIGH SENSITIVITY) - Abnormal; Notable for the following components:   Troponin I (High Sensitivity) 18 (*)    All other components within normal limits  RESP PANEL BY RT-PCR (FLU A&B, COVID) ARPGX2  CULTURE, BLOOD (ROUTINE X 2)  CULTURE, BLOOD (ROUTINE X 2)  URINE CULTURE  LACTIC ACID, PLASMA  PROTIME-INR  APTT  TSH  PROTIME-INR  CORTISOL-AM, BLOOD  PROCALCITONIN    ____________________________________________  EKG: Sinus tachycardia, VR 127. PR 132, QRS 70, QTc 433. Non-specific TW changes. No acute ST elevation or depressions. ________________________________________  RADIOLOGY All imaging, including plain films, CT scans, and ultrasounds, independently reviewed by me, and interpretations confirmed via formal radiology reads.  ED MD interpretation:   CXR: COPD, no acute abnormalities  Official radiology report(s): DG Chest Port 1 View  Result Date: 10/26/2020 CLINICAL DATA:  Question sepsis EXAM: PORTABLE CHEST 1 VIEW COMPARISON:  Portable exam 1215 hours compared to 06/17/2020 FINDINGS: Normal heart size, mediastinal contours, and pulmonary vascularity. Atherosclerotic calcification aorta. Emphysematous changes consistent with COPD. No acute infiltrate, pleural effusion, or pneumothorax. Small nodular focus at RIGHT upper lobe 6 mm diameter unchanged since 05/28/2018. No acute osseous findings. IMPRESSION: COPD changes. No acute abnormalities. Aortic Atherosclerosis (ICD10-I70.0) and Emphysema (ICD10-J43.9). Electronically  Signed   By: Lavonia Dana M.D.   On: 10/26/2020 12:39    ____________________________________________  PROCEDURES   Procedure(s) performed (including Critical Care):  Procedures  ____________________________________________  INITIAL IMPRESSION / MDM / [place] / ED COURSE  As part of my medical decision making, I reviewed the following data  within the [place] notes reviewed and incorporated, Old chart reviewed, Notes from prior ED visits, and [place] Controlled Substance Database       *DEMONTAE ANTUNES was evaluated in Emergency Department on 10/26/2020 for the symptoms described in the history of present illness. He was evaluated in the context of the global COVID-19 pandemic, which necessitated consideration that the patient might be at risk for infection with the SARS-CoV-2 virus that causes COVID-19. Institutional protocols and algorithms that pertain to the evaluation of patients at risk for COVID-19 are in a state of rapid change based on information released by regulatory bodies including the CDC and federal and state organizations. These policies and algorithms were followed during the patient's care in the ED.  Some ED evaluations and interventions may be delayed as a result of limited staffing during the pandemic.*     Medical Decision Making:  79 yo M here with generalized weakness, fever, fatigue. On arrival, pt febrile, tachycardic and borderline hypotensive. Lab work reveals moderate leukocytosis, lactic acidosis concerning for sepsis. Sepsis protocol initiated with broad-spectrum ABX, fluids. Initially covered for possible CAP given cough, SOB but suspect source is urine based on UA. CXR reviewed and shows no PNA. Abdomen is soft, NT, ND, with no rebound or guarding.  Pt improving with fluids. Will admit for sepsis 2/2 UTI. Patient maintained on telemetry in ED, which shows SR without significant ectopy or arrhythmia.  ____________________________________________  FINAL CLINICAL IMPRESSION(S) / ED DIAGNOSES  Final diagnoses:  Sepsis secondary to UTI ([place])     MEDICATIONS GIVEN DURING THIS VISIT:  Medications  cefTRIAXone (ROCEPHIN) 2 g in sodium chloride 0.9 % 100 mL IVPB (0 g Intravenous Stopped 10/26/20 1230)  ipratropium-albuterol (DUONEB) 0.5-2.5 (3) MG/3ML nebulizer solution 3 mL  (has no administration in time range)  aspirin EC tablet 81 mg (has no administration in time range)  atorvastatin (LIPITOR) tablet 40 mg (has no administration in time range)  pantoprazole (PROTONIX) EC tablet 40 mg (has no administration in time range)  multivitamin with minerals tablet 1 tablet (has no administration in time range)  albuterol (PROVENTIL) (2.5 MG/3ML) 0.083% nebulizer solution 2.5 mg (has no administration in time range)  vitamin B-12 (CYANOCOBALAMIN) tablet 500 mcg (has no administration in time range)  lactated ringers infusion ( Intravenous New Bag/Given 10/26/20 2034)  enoxaparin (LOVENOX) injection 40 mg (has no administration in time range)  acetaminophen (TYLENOL) tablet 325 mg (has no administration in time range)    Or  acetaminophen (TYLENOL) suppository 325 mg (has no administration in time range)  ondansetron (ZOFRAN) tablet 4 mg (has no administration in time range)    Or  ondansetron (ZOFRAN) injection 4 mg (has no administration in time range)  polyethylene glycol (MIRALAX / GLYCOLAX) packet 17 g (has no administration in time range)  ipratropium-albuterol (DUONEB) 0.5-2.5 (3) MG/3ML nebulizer solution 3 mL (has no administration in time range)  budesonide (PULMICORT) nebulizer solution 0.25 mg (has no administration in time range)  arformoterol (BROVANA) nebulizer solution 15 mcg (has no administration in time range)    And  umeclidinium bromide (INCRUSE ELLIPTA) 62.5 MCG/INH 1 puff (has no administration in time  range)  lactated ringers bolus 1,000 mL (0 mLs Intravenous Stopped 10/26/20 1319)    And  lactated ringers bolus 1,000 mL (0 mLs Intravenous Stopped 10/26/20 1318)    And  lactated ringers bolus 250 mL (0 mLs Intravenous Stopped 10/26/20 1301)  acetaminophen (TYLENOL) tablet 1,000 mg (1,000 mg Oral Given 10/26/20 1258)     ED Discharge Orders    None       Note:  This document was prepared using Dragon voice recognition software and may  include unintentional dictation errors.   Duffy Bruce, MD 10/26/20 2107

## 2020-10-26 NOTE — ED Triage Notes (Signed)
Pt reports excessive weakness and SOB for the last 30 hours. Pt reports hx of COPD and is on oxygen at night but the last couple of days has felt worse.

## 2020-10-26 NOTE — ED Notes (Signed)
Nurse Delsa Bern informed of assigned bed

## 2020-10-26 NOTE — ED Notes (Signed)
Report given to CPOD Nurse

## 2020-10-26 NOTE — Assessment & Plan Note (Addendum)
Severe COPD continued smoker with 24+ hours of body aches, chills, fatigue and weakness, chest congestion, chronic cough. Will need more evaluation than can be provided via virtual visit - I have recommended he seek care at local [facility]. Anticipate will need testing for influenza, COVID, and physical examination. Pt and family agree.  I had my CMA Lattie Haw call ahead to notify [facility] pt on his way.

## 2020-10-26 NOTE — Progress Notes (Signed)
CODE SEPSIS - PHARMACY COMMUNICATION  **Broad Spectrum Antibiotics should be administered within 1 hour of Sepsis diagnosis**  Time Code Sepsis Called/Page Received: 1139   Antibiotics Ordered: Ceftriaxone and Azithromycin   Time of 1st antibiotic administration: [place], PharmD, [facility] 10/26/2020 12:14 PM

## 2020-10-26 NOTE — Progress Notes (Signed)
Patient ID: Parker Martinez, male    DOB: Dec 31, 1940, 79 y.o.   MRN: [id number]  Virtual visit completed through [employer], a video enabled telemedicine application. Due to national recommendations of social distancing due to COVID-19, a virtual visit is felt to be most appropriate for this patient at this time. Reviewed limitations, risks, security and privacy concerns of performing a virtual visit and the availability of in person appointments. I also reviewed that there may be a patient responsible charge related to this service. The patient agreed to proceed.   Patient location: home Provider location: [place] [facility], office Persons participating in this virtual visit: patient, provider, family members   If any vitals were documented, they were collected by patient at home unless specified below.    BP 97/61   Pulse (!) 131   Temp 98 F (36.7 C)   Ht 5\' 9"  (1.753 m)   Wt 147 lb (66.7 kg)   BMI 21.71 kg/m   BP Readings from Last 3 Encounters:  10/26/20 97/61  10/20/20 108/60  10/07/20 100/60    Pulse Readings from Last 3 Encounters:  10/26/20 (!) 131  10/20/20 (!) 120  10/07/20 87    CC: body aches, fatigue Subjective:   HPI: Parker Martinez is a 79 y.o. male presenting on 10/26/2020 for Generalized Body Aches (C/o body aches, chills, fatigue, weakness and loss of appetite.  Sxs started yesterday.  Was origianally scheduled for CPE. )   Was scheduled for physical in office today however visit was converted to acute sick virtual visit due to new symptoms.   2d h/o body aches, chills, fatigue, fever, weakness, no appetite. Chest > head congestion. Exertional dyspnea (chronic). Today feeling better. Mild headache.  No known sick contacts.  No ear or tooth pain, ST, no loss of taste or smell, nausea, diarrhea.  No change in cough, no increased sputum production from baseline.   Known severe COPD on breztri, allegra, singulair and prednisone 10mg  daily. Uses  albuterol inhaler QID.  Not on antihypertensives. BP runs low.  Tachycardia attributed to albuterol use.       Relevant past medical, surgical, family and social history reviewed and updated as indicated. Interim medical history since our last visit reviewed. Allergies and medications reviewed and updated. Outpatient Medications Prior to Visit  Medication Sig Dispense Refill  . albuterol (VENTOLIN HFA) 108 (90 Base) MCG/ACT inhaler USE 2 PUFFS EVERY 6 HOURS AS NEEDED FOR WHEEZING 18 g 3  . alfuzosin (UROXATRAL) 10 MG 24 hr tablet Take 10 mg by mouth daily with breakfast.    . aspirin 81 MG tablet Take 81 mg by mouth every morning.     Marland Kitchen atorvastatin (LIPITOR) 40 MG tablet Take 1 tablet (40 mg total) by mouth daily. 90 tablet 3  . BREZTRI AEROSPHERE 160-9-4.8 MCG/ACT AERO INHALE 2 PUFFS BY MOUTH TWICE A DAY 10.7 g 11  . diphenhydrAMINE (BENADRYL) 25 MG tablet Take 25 mg by mouth every morning.    Marland Kitchen EPINEPHrine (EPIPEN 2-PAK) 0.3 mg/0.3 mL IJ SOAJ injection Inject 0.3 mLs (0.3 mg total) into the muscle as needed for anaphylaxis. 1 each 1  . EPINEPHrine 0.3 mg/0.3 mL IJ SOAJ injection Inject 0.3 mg into the muscle as needed for anaphylaxis.     . fexofenadine (ALLEGRA) 180 MG tablet Take 180 mg by mouth daily.    Marland Kitchen ipratropium (ATROVENT) 0.06 % nasal spray PLACE 2 SPRAYS INTO BOTH NOSTRILS 4 (FOUR) TIMES DAILY AS NEEDED FOR  RHINITIS. 15 mL 3  . montelukast (SINGULAIR) 10 MG tablet Take 1 tablet (10 mg total) by mouth at bedtime. 90 tablet 3  . Multiple Vitamin (MULTIVITAMIN) tablet Take 1 tablet by mouth daily.    . pantoprazole (PROTONIX) 40 MG tablet TAKE 1 TABLET BY MOUTH 2 TIMES DAILY BEFORE A MEAL. 30 MINS BEFORE BREAKFAST AND SUPPER 180 tablet 0  . predniSONE (DELTASONE) 10 MG tablet Take 1 tablet (10 mg total) by mouth daily with breakfast. 30 tablet 2  . predniSONE (DELTASONE) 20 MG tablet Take two tablets daily for 3 days followed by one tablet daily for 4 days 10 tablet 0  . vitamin  B-12 (CYANOCOBALAMIN) 500 MCG tablet Take 500 mcg by mouth every morning.      No facility-administered medications prior to visit.     Per HPI unless specifically indicated in ROS section below Review of Systems Objective:  BP 97/61   Pulse (!) 131   Temp 98 F (36.7 C)   Ht 5\' 9"  (1.753 m)   Wt 147 lb (66.7 kg)   BMI 21.71 kg/m   Wt Readings from Last 3 Encounters:  10/26/20 147 lb (66.7 kg)  10/20/20 147 lb 2 oz (66.7 kg)  10/07/20 148 lb 6.4 oz (67.3 kg)      Physical exam: Gen: alert, tired appearing  Pulm: increased work of breathing with difficulty completing sentences  Psych: normal mood, normal thought content      Results for orders placed or performed in visit on 10/19/20  CBC with Differential/Platelet  Result Value Ref Range   WBC 10.6 (H) 4.0 - 10.5 K/uL   RBC 4.08 (L) 4.22 - 5.81 Mil/uL   Hemoglobin 12.9 (L) 13.0 - 17.0 g/dL   HCT 38.8 (L) 39 - 52 %   MCV 95.1 78.0 - 100.0 fl   MCHC 33.3 30.0 - 36.0 g/dL   RDW 13.9 11.5 - 15.5 %   Platelets 252.0 150 - 400 K/uL   Neutrophils Relative % 54.9 43 - 77 %   Lymphocytes Relative 33.8 12 - 46 %   Monocytes Relative 8.7 3 - 12 %   Eosinophils Relative 2.2 0 - 5 %   Basophils Relative 0.4 0 - 3 %   Neutro Abs 5.8 1.4 - 7.7 K/uL   Lymphs Abs 3.6 0.7 - 4.0 K/uL   Monocytes Absolute 0.9 0.1 - 1.0 K/uL   Eosinophils Absolute 0.2 0.0 - 0.7 K/uL   Basophils Absolute 0.0 0.0 - 0.1 K/uL  PSA  Result Value Ref Range   PSA 0.01 (L) 0.10 - 4.00 ng/mL  Microalbumin / creatinine urine ratio  Result Value Ref Range   Microalb, Ur 2.0 (H) 0.0 - 1.9 mg/dL   Creatinine,U 202.1 mg/dL   Microalb Creat Ratio 1.0 0.0 - 30.0 mg/g  VITAMIN D 25 Hydroxy (Vit-D Deficiency, Fractures)  Result Value Ref Range   VITD 30.92 30.00 - 100.00 ng/mL  Comprehensive metabolic panel  Result Value Ref Range   Sodium 143 135 - 145 mEq/L   Potassium 4.2 3.5 - 5.1 mEq/L   Chloride 104 96 - 112 mEq/L   CO2 31 19 - 32 mEq/L   Glucose,  Bld 72 70 - 99 mg/dL   BUN 12 6 - 23 mg/dL   Creatinine, Ser 0.83 0.40 - 1.50 mg/dL   Total Bilirubin 0.4 0.2 - 1.2 mg/dL   Alkaline Phosphatase 60 39 - 117 U/L   AST 20 0 - 37 U/L   ALT  20 0 - 53 U/L   Total Protein 5.6 (L) 6.0 - 8.3 g/dL   Albumin 3.6 3.5 - 5.2 g/dL   GFR 82.73 >60.00 mL/min   Calcium 9.4 8.4 - 10.5 mg/dL  Lipid panel  Result Value Ref Range   Cholesterol 173 0 - 200 mg/dL   Triglycerides 79.0 0 - 149 mg/dL   HDL 116.10 >39.00 mg/dL   VLDL 15.8 0.0 - 40.0 mg/dL   LDL Cholesterol 41 0 - 99 mg/dL   Total CHOL/HDL Ratio 1    NonHDL 56.43    Assessment & Plan:   Problem List Items Addressed This Visit    Smoker   Shortness of breath - Primary    Severe COPD continued smoker with 24+ hours of body aches, chills, fatigue and weakness, chest congestion, chronic cough. Will need more evaluation than can be provided via virtual visit - I have recommended he seek care at local [facility]. Anticipate will need testing for influenza, COVID, and physical examination. Pt and family agree.  I had my CMA Lattie Haw call ahead to notify [facility] pt on his way.       COPD, severe ([place])    Other Visit Diagnoses    Generalized body aches       Malaise and fatigue           No orders of the defined types were placed in this encounter.  No orders of the defined types were placed in this encounter.   I discussed the assessment and treatment plan with the patient. The patient was provided an opportunity to ask questions and all were answered. The patient agreed with the plan and demonstrated an understanding of the instructions. The patient was advised to call back or seek an in-person evaluation if the symptoms worsen or if the condition fails to improve as anticipated.  Follow up plan: No follow-ups on file.  Ria Bush, MD

## 2020-10-26 NOTE — Plan of Care (Signed)

## 2020-10-26 NOTE — ED Provider Notes (Signed)
Parker Martinez    CSN: [id number] Arrival date & time: 10/26/20  1025      History   Chief Complaint No chief complaint on file.   HPI Parker Martinez is a 79 y.o. male.   Reports that he has been very short of breath for the last 24-30 hours. Known COPD. Has O2 at home. Reports increased work of breathing, cough, fatigue, weakness. Denies nausea, vomiting, diarrhea, fever, rash, other symptoms.  Reports that he had a televisit earlier today with his MD, and they suggested that he be seen in urgent care or in the ER today.  ROS per HPI    The history is provided by the patient and the spouse.    Past Medical History:  Diagnosis Date  . Abnormal drug screen 10/2013, 03/2015   low Cr, +EtOH, neg hydrocodone (10/2013, again 03/2015)  . Allergic rhinitis   . Arthritis    lumbar and cervical spine  . Carotid stenosis 09/11/2014   40-59% bilat ICA stenosis, rpt 1 yr (09/2014)   . COPD (chronic obstructive pulmonary disease) (HCC)    severe, participated in [place] study  . DDD (degenerative disc disease)    lumbar (HNP R L3/4) s/p ESI, cervical  . Diverticulitis 2015   by CT  . Diverticulosis 2008   severe by colonoscopy and CT  . Duodenitis   . Esophageal stricture 2012   s/p dilation  . GERD (gastroesophageal reflux disease)   . Hemangioma    congenital left arm  . History of kidney stones remote  . HTN (hypertension)    borderline readings at home  . Idiopathic anaphylaxis    h/o recurrent anaphylaxis, unknown trigger, s/p eval at [facility] [phone]), last episode 03/2011  . PAD (peripheral artery disease) ([place])   . Personal history of colonic adenomas 09/02/2013  . Prostate cancer [facility]) 2011   prostate seed implant (Dr. [name])  . Smoker   . Status post dilatation of esophageal stricture   . Subclavian steal syndrome 10/12/2016   Left subclavian steal, with 18 mmHg brachial artery pressure gradient (10/2016). If symptomatic, rec PV consult     Patient Active Problem List   Diagnosis Date Noted  . Shortness of breath 10/26/2020  . Sepsis ([place]) 10/26/2020  . Tachycardia 10/20/2020  . Right foot drop 09/21/2020  . Swelling of right foot 09/07/2020  . Aorto-iliac atherosclerosis ([place]) 01/25/2019  . Left lumbar radiculopathy 01/24/2019  . Bilateral hearing loss 10/21/2018  . BPV (benign positional vertigo), right 09/09/2018  . Heart murmur 09/09/2018  . Encounter for chronic pain management 05/15/2017  . Chronic pain in left shoulder 01/30/2017  . Subclavian steal syndrome 10/12/2016  . Abnormal drug screen   . Atherosclerosis of native arteries of extremity with intermittent claudication ([place]) 10/21/2014  . Medicare annual wellness visit, subsequent 09/11/2014  . Carotid stenosis 09/11/2014  . Advanced care planning/counseling discussion 09/11/2014  . Vitamin D deficiency 09/01/2014  . DDD (degenerative disc disease), lumbar   . Personal history of colonic adenomas 09/02/2013  . COPD exacerbation ([place]) 03/04/2013  . Healthcare maintenance 04/25/2012  . Polycythemia secondary to smoking 04/25/2012  . Dyslipidemia 04/25/2012  . HTN (hypertension)   . PVD (peripheral vascular disease) with claudication ([place])   . COPD, severe ([place])   . Prostate cancer ([place])   . GERD (gastroesophageal reflux disease)   . Idiopathic anaphylaxis   . Smoker 09/13/2011  . Esophageal rings - lower esophagus 06/04/2009    Past Surgical  History:  Procedure Laterality Date  . BALLOON DILATION N/A 10/02/2014   Procedure: BALLOON DILATION;  Surgeon: Gatha Mayer, MD;  Location: [facility];  Service: Endoscopy;  Laterality: N/A;  . CATARACT EXTRACTION W/PHACO Right 01/26/2020   Procedure: CATARACT EXTRACTION PHACO AND INTRAOCULAR LENS PLACEMENT (IOC) RIGHT 5.15  00:42.9;  Surgeon: [name], MD;  Location: [place];  Service: Ophthalmology;  Laterality: Right;  . CATARACT EXTRACTION W/PHACO Left 02/16/2020   Procedure:  CATARACT EXTRACTION PHACO AND INTRAOCULAR LENS PLACEMENT (IOC) LEFT 3.87  00:45.4;  Surgeon: [name], MD;  Location: [place];  Service: Ophthalmology;  Laterality: Left;  . COLONOSCOPY  08/2013   4 tubular adenomas, severe divertic, rpt 3 yrs [name])  . COLONOSCOPY  10/2016   severe diverticulosis, no f/u recommended [name])  . ESOPHAGOGASTRODUODENOSCOPY  10/2016   2 rings dilated [name])  . ESOPHAGOGASTRODUODENOSCOPY  01/2019   dilated esophageal webs [name])  . ESOPHAGOGASTRODUODENOSCOPY (EGD) WITH PROPOFOL N/A 10/02/2014   WNL, mild esophagitis; Gatha Mayer, MD  . INSERTION PROSTATE RADIATION SEED  01/2015   [name]  . LUMBAR MICRODISCECTOMY Left 03/2019   L L4/L5 [name])  . SHOULDER SURGERY  1961   due to chronic dislocation  . SPIROMETRY  10/2010   severe obstruction, FEV1 41%, ratio 0.46  . TONSILLECTOMY  1944 ?  . UPPER GASTROINTESTINAL ENDOSCOPY  06/08/2009   dilated esophageal stricture, duodenitis  . virtual colonoscopy  2007   [name], rpt rec 5 yrs       Home Medications    Prior to Admission medications   Medication Sig Start Date End Date Taking? Authorizing Provider  albuterol (VENTOLIN HFA) 108 (90 Base) MCG/ACT inhaler USE 2 PUFFS EVERY 6 HOURS AS NEEDED FOR WHEEZING 08/19/20   Ria Bush, MD  alfuzosin (UROXATRAL) 10 MG 24 hr tablet Take 10 mg by mouth daily with breakfast.    [provider]  aspirin 81 MG tablet Take 81 mg by mouth every morning.     [provider]  atorvastatin (LIPITOR) 40 MG tablet Take 1 tablet (40 mg total) by mouth daily. 10/24/19   Ria Bush, MD  BREZTRI AEROSPHERE 160-9-4.8 MCG/ACT AERO INHALE 2 PUFFS BY MOUTH TWICE A DAY 10/20/20   Tyler Pita, MD  diphenhydrAMINE (BENADRYL) 25 MG tablet Take 25 mg by mouth every morning.    [provider]  EPINEPHrine (EPIPEN 2-PAK) 0.3 mg/0.3 mL IJ SOAJ injection Inject 0.3 mLs (0.3 mg total) into the muscle as needed  for anaphylaxis. 07/17/20   Nance Pear, MD  EPINEPHrine 0.3 mg/0.3 mL IJ SOAJ injection Inject 0.3 mg into the muscle as needed for anaphylaxis.     [provider]  fexofenadine (ALLEGRA) 180 MG tablet Take 180 mg by mouth daily.    [provider]  ipratropium (ATROVENT) 0.06 % nasal spray PLACE 2 SPRAYS INTO BOTH NOSTRILS 4 (FOUR) TIMES DAILY AS NEEDED FOR RHINITIS. 10/14/20   Tyler Pita, MD  montelukast (SINGULAIR) 10 MG tablet Take 1 tablet (10 mg total) by mouth at bedtime. 10/24/19   Ria Bush, MD  Multiple Vitamin (MULTIVITAMIN) tablet Take 1 tablet by mouth daily.    [provider]  pantoprazole (PROTONIX) 40 MG tablet TAKE 1 TABLET BY MOUTH 2 TIMES DAILY BEFORE A MEAL. [place] AND SUPPER 09/06/20   Gatha Mayer, MD  predniSONE (DELTASONE) 10 MG tablet Take 1 tablet (10 mg total) by mouth daily with breakfast. 10/07/20  Tyler Pita, MD  predniSONE (DELTASONE) 20 MG tablet Take two tablets daily for 3 days followed by one tablet daily for 4 days 10/26/20 11/02/20  Ria Bush, MD  vitamin B-12 (CYANOCOBALAMIN) 500 MCG tablet Take 500 mcg by mouth every morning.     [provider]    Family History Family History  Problem Relation Age of Onset  . Lung cancer Father   . Hyperlipidemia Father   . Arthritis Mother   . Breast cancer Daughter   . Coronary artery disease Neg Hx   . Stroke Neg Hx   . Diabetes Neg Hx     Social History Social History   Tobacco Use  . Smoking status: Current Every Day Smoker    Packs/day: 1.00    Years: 60.00    Pack years: 60.00    Types: Cigarettes  . Smokeless tobacco: Never Used  . Tobacco comment: 0-1/2 ppd/ 10/07/20  Vaping Use  . Vaping Use: Never used  Substance Use Topics  . Alcohol use: Yes    Alcohol/week: 14.0 standard drinks    Types: 14 Cans of beer per week    Comment: socially  . Drug use: No     Allergies   Flonase [fluticasone  propionate]   Review of Systems Review of Systems   Physical Exam Triage Vital Signs ED Triage Vitals [10/26/20 1031]  Enc Vitals Group     BP 126/62     Pulse Rate (!) 136     Resp (!) 28     Temp (!) 101.1 F (38.4 C)     Temp Source Oral     SpO2 91 %     Weight      Height      Head Circumference      Peak Flow      Pain Score      Pain Loc      Pain Edu?      Excl. in [place]?    No data found.  Updated Vital Signs BP 126/62 (BP Location: Right Arm)   Pulse (!) 136   Temp (!) 101.1 F (38.4 C) (Oral)   Resp (!) 28   SpO2 91%   Physical Exam Constitutional:      General: He is in acute distress.     Appearance: He is ill-appearing.  HENT:     Head: Normocephalic and atraumatic.     Nose: Nose normal.     Mouth/Throat:     Mouth: Mucous membranes are moist.     Pharynx: Oropharynx is clear.  Eyes:     Extraocular Movements: Extraocular movements intact.     Pupils: Pupils are equal, round, and reactive to light.  Cardiovascular:     Rate and Rhythm: Tachycardia present.  Pulmonary:     Effort: Respiratory distress present.     Breath sounds: Wheezing, rhonchi and rales present.  Chest:     Chest wall: No tenderness.  Skin:    General: Skin is warm and dry.     Capillary Refill: Capillary refill takes less than 2 seconds.  Neurological:     General: No focal deficit present.     Mental Status: He is alert.  Psychiatric:        Mood and Affect: Mood normal.        Behavior: Behavior normal.      UC Treatments / Results  Labs (all labs ordered are listed, but only abnormal results are displayed) Labs Reviewed -  No data to display  Procedures Procedures (including critical care time)  Medications Ordered in UC Medications - No data to display  Initial Impression / Assessment and Plan / UC Course  I have reviewed the triage vital signs and the nursing notes.  Pertinent labs & imaging results that were available during my care of the patient  were reviewed by me and considered in my medical decision making (see chart for details).     COPD Respiratory Distress Tachycardia Fever Tachypnea  Presents today with known COPD with increased weakness and feeling poorly over the last 24 to 30 hours per patient. Patient did not have a fever when checked at home this morning.  Is now febrile in the office. Is working very hard to breathe, put on 2 L O2 with O2 sat of 91%, rose up to 92% Discussed with patient that they will be best served in the ER, questionable sepsis Declined EMS, wife states that she will drive to [facility] ER Patient able to get up and walk with cane out of the office  Final Clinical Impressions(s) / UC Diagnoses   Final diagnoses:  Chronic obstructive pulmonary disease, unspecified COPD type ([place])  Fever, unspecified  Tachycardia  Tachypnea  Acute respiratory distress     Discharge Instructions     Go directly to the ER for    ED Prescriptions    None     PDMP not reviewed this encounter.   Faustino Congress, NP 10/26/20 [phone]

## 2020-10-26 NOTE — Progress Notes (Signed)
Elink following Code Sepsis. 

## 2020-10-26 NOTE — ED Notes (Signed)
Patient is being discharged from the Urgent Care and sent to the Emergency Department via private vehicle . Per Colletta Maryland NP, patient is in need of higher level of care due to COPD exacerbation. Patient is aware and verbalizes understanding of plan of care.  Vitals:   10/26/20 1031  BP: 126/62  Pulse: (!) 136  Resp: (!) 28  Temp: (!) 101.1 F (38.4 C)  SpO2: 91%

## 2020-10-27 ENCOUNTER — Telehealth: Payer: Self-pay | Admitting: *Deleted

## 2020-10-27 LAB — CBC WITH DIFFERENTIAL/PLATELET
Abs Immature Granulocytes: 0.05 10*3/uL (ref 0.00–0.07)
Basophils Absolute: 0 10*3/uL (ref 0.0–0.1)
Basophils Relative: 0 %
Eosinophils Absolute: 0 10*3/uL (ref 0.0–0.5)
Eosinophils Relative: 0 %
HCT: 30.5 % — ABNORMAL LOW (ref 39.0–52.0)
Hemoglobin: 10.5 g/dL — ABNORMAL LOW (ref 13.0–17.0)
Immature Granulocytes: 1 %
Lymphocytes Relative: 8 %
Lymphs Abs: 0.8 10*3/uL (ref 0.7–4.0)
MCH: 32.5 pg (ref 26.0–34.0)
MCHC: 34.4 g/dL (ref 30.0–36.0)
MCV: 94.4 fL (ref 80.0–100.0)
Monocytes Absolute: 1.3 10*3/uL — ABNORMAL HIGH (ref 0.1–1.0)
Monocytes Relative: 13 %
Neutro Abs: 7.9 10*3/uL — ABNORMAL HIGH (ref 1.7–7.7)
Neutrophils Relative %: 78 %
Platelets: 155 10*3/uL (ref 150–400)
RBC: 3.23 MIL/uL — ABNORMAL LOW (ref 4.22–5.81)
RDW: 12.9 % (ref 11.5–15.5)
WBC: 10.1 10*3/uL (ref 4.0–10.5)
nRBC: 0 % (ref 0.0–0.2)

## 2020-10-27 LAB — COMPREHENSIVE METABOLIC PANEL
ALT: 22 U/L (ref 0–44)
AST: 29 U/L (ref 15–41)
Albumin: 2.5 g/dL — ABNORMAL LOW (ref 3.5–5.0)
Alkaline Phosphatase: 52 U/L (ref 38–126)
Anion gap: 12 (ref 5–15)
BUN: 14 mg/dL (ref 8–23)
CO2: 25 mmol/L (ref 22–32)
Calcium: 8.2 mg/dL — ABNORMAL LOW (ref 8.9–10.3)
Chloride: 97 mmol/L — ABNORMAL LOW (ref 98–111)
Creatinine, Ser: 0.85 mg/dL (ref 0.61–1.24)
GFR, Estimated: >60 mL/min (ref >60)
Glucose, Bld: 95 mg/dL (ref 70–99)
Potassium: 3.6 mmol/L (ref 3.5–5.1)
Sodium: 134 mmol/L — ABNORMAL LOW (ref 135–145)
Total Bilirubin: 0.9 mg/dL (ref 0.3–1.2)
Total Protein: 5.1 g/dL — ABNORMAL LOW (ref 6.5–8.1)

## 2020-10-27 LAB — PROTIME-INR
INR: 1 (ref 0.8–1.2)
Prothrombin Time: 12.8 seconds (ref 11.4–15.2)

## 2020-10-27 LAB — PROCALCITONIN: Procalcitonin: 0.98 ng/mL

## 2020-10-27 LAB — CORTISOL-AM, BLOOD: Cortisol - AM: 21.7 ug/dL (ref 6.7–22.6)

## 2020-10-27 MED ORDER — LORATADINE 10 MG PO TABS
10.0000 mg | ORAL_TABLET | Freq: Every day | ORAL | Status: DC
  Administered 2020-10-27: 10 mg via ORAL
  Filled 2020-10-27 (×2): qty 1

## 2020-10-27 MED ORDER — ALFUZOSIN HCL ER 10 MG PO TB24
10.0000 mg | ORAL_TABLET | Freq: Every day | ORAL | Status: DC
  Administered 2020-10-28: 10 mg via ORAL
  Filled 2020-10-27: qty 1

## 2020-10-27 NOTE — Telephone Encounter (Signed)
Noted! Thank you

## 2020-10-27 NOTE — Telephone Encounter (Signed)
Patient's wife called stating that she wanted to let Dr. [name] know that he is in the hospital with sepsis .Mrs. Ransford stated that he is on antibiotics IV and seems to be a little better today, but real tired. Patient's wife stated that she just wanted to let Dr. [name] be aware of this so he can follow what is going on with her husband.

## 2020-10-27 NOTE — Progress Notes (Signed)
PROGRESS NOTE   Parker Martinez  CHE:[id number] DOB: 01-25-1941 DOA: 10/26/2020 PCP: Ria Bush, MD  Brief Narrative:  79 year old white male Prostate adeno CA 2009 status post brachytherapy follows Dr. [name] [facility] seen 10/04/2020 and had cystoscopy at the time Severe 3-4 COPD continued tobacco dependence Bilateral carotid stenosis Reflux  Presented to PCP as a televisit sent to urgent care and came to ED weakness several days S OB dysuria X 5 days In ED found to have sinus tachycardia WBC 13.2, hemoglobin 12.6 sodium 131 potassium 3.7 BUN/creatinine 19/0.9 troponin XX 2-->18 Lactic acid 2.0-nephrograms at 1.6 Procalcitonin 0.9 COPD changes on x-ray no pneumonia Blood culture 11/23 X2 pending, urine culture pending Admitted with concerns for sepsis prior to admission possibly secondary to related instrumentation started ceftriaxone azithromycin  Assessment & Plan:   Principal Problem:   Sepsis ([place]) Active Problems:   COPD, severe ([place])   GERD (gastroesophageal reflux disease)   Dyslipidemia   DDD (degenerative disc disease), lumbar   Bilateral hearing loss   1. Sepsis from urinary infection on admission 2. Prostate adeno cancer status post brachytherapy with recent instrumentation a. Continue ceftriaxone 2 g, urine/blood culture 11/23 pending-lactic acidosis from from PTA and sepsis physiology resolved-saline lock b. Can continue annual Trachsel 10 daily c. CBC a.m. 3. Stage III-IV COPD and continued tobacco a. Counseled to quit-continue albuterol, Brovana, Pulmicort, DuoNeb 4. Bilateral carotid stenosis a. Outpatient follow-up and screening 5. Reflux a. Continue Protonix 40 daily  DVT prophylaxis: Lovenox Code Status: DNR Family Communication: None present Disposition:  Status is: Inpatient  Remains inpatient appropriate because:Persistent severe electrolyte disturbances and IV treatments appropriate due to intensity of illness or inability to take  PO   Dispo: The patient is from: Home              Anticipated d/c is to: Home              Anticipated d/c date is: 2 days              Patient currently is not medically stable to d/c.       Consultants:   None  Procedures: No  Antimicrobials: Ceftriaxone   Subjective: Doing fair sitting up in bed slightly winded does not feel terrible today eat some diet  Objective: Vitals:   10/26/20 2218 10/27/20 0358 10/27/20 0720 10/27/20 0800  BP:  117/64 131/70   Pulse: (!) 109 (!) 118 (!) 107   Resp:   14   Temp:  (!) 100.4 F (38 C) 99.4 F (37.4 C)   TempSrc:  Oral Oral   SpO2:  97% 94% 93%  Weight:  66.4 kg    Height:        Intake/Output Summary (Last 24 hours) at 10/27/2020 0845 Last data filed at 10/27/2020 5361 Gross per 24 hour  Intake 120 ml  Output 900 ml  Net -780 ml   Filed Weights   10/26/20 1059 10/26/20 1811 10/27/20 0358  Weight: 66.7 kg 67.5 kg 66.4 kg    Examination:  EOMI NCAT no focal deficit Chest clear no added sounds rales rhonchi Abdomen soft nontender no rebound no guarding No lower extremity edema no swelling or rash   Data Reviewed: I have personally reviewed following labs and imaging studies Sodium 134 potassium 3.6 BUNs/creatinine 14/0.8 Procalcitonin 0.9 Hemoglobin 10.5 WBC 10.1 procalcitonin 0.9  Radiology Studies: DG Chest Port 1 View  Result Date: 10/26/2020 CLINICAL DATA:  Question sepsis EXAM: PORTABLE CHEST 1 VIEW COMPARISON:  Portable exam 1215 hours compared to 06/17/2020 FINDINGS: Normal heart size, mediastinal contours, and pulmonary vascularity. Atherosclerotic calcification aorta. Emphysematous changes consistent with COPD. No acute infiltrate, pleural effusion, or pneumothorax. Small nodular focus at RIGHT upper lobe 6 mm diameter unchanged since 05/28/2018. No acute osseous findings. IMPRESSION: COPD changes. No acute abnormalities. Aortic Atherosclerosis (ICD10-I70.0) and Emphysema (ICD10-J43.9).  Electronically Signed   By: Lavonia Dana M.D.   On: 10/26/2020 12:39     Scheduled Meds: . [START ON 10/28/2020] alfuzosin  10 mg Oral Q breakfast  . arformoterol  15 mcg Nebulization BID   And  . umeclidinium bromide  1 puff Inhalation Daily  . aspirin EC  81 mg Oral BH-q7a  . atorvastatin  40 mg Oral Daily  . budesonide (PULMICORT) nebulizer solution  0.25 mg Nebulization BID  . enoxaparin (LOVENOX) injection  40 mg Subcutaneous Q24H  . ipratropium-albuterol  3 mL Nebulization Once  . ipratropium-albuterol  3 mL Nebulization TID  . loratadine  10 mg Oral Daily  . multivitamin with minerals  1 tablet Oral Daily  . pantoprazole  40 mg Oral Daily  . vitamin B-12  500 mcg Oral BH-q7a   Continuous Infusions: . cefTRIAXone (ROCEPHIN)  IV Stopped (10/26/20 1230)  . lactated ringers 150 mL/hr at 10/27/20 0319     LOS: 1 day    Time spent: 24 minutes  Nita Sells, MD [facility] To contact the attending provider between 7A-7P or the covering provider during after hours 7P-7A, please log into the web site [email or web address] and access using universal [place] password for that web site. If you do not have the password, please call the hospital operator.  10/27/2020, 8:45 AM

## 2020-10-27 NOTE — Plan of Care (Signed)
  Problem: Education: Goal: Knowledge of General Education information will improve Description: Including pain rating scale, medication(s)/side effects and non-pharmacologic comfort measures Outcome: Progressing   Problem: Clinical Measurements: Goal: Ability to maintain clinical measurements within normal limits will improve Outcome: Progressing   Problem: Clinical Measurements: Goal: Diagnostic test results will improve Outcome: Progressing   Problem: Activity: Goal: Risk for activity intolerance will decrease Outcome: Progressing

## 2020-10-28 LAB — CBC WITH DIFFERENTIAL/PLATELET
Abs Immature Granulocytes: 0.04 10*3/uL (ref 0.00–0.07)
Basophils Absolute: 0 10*3/uL (ref 0.0–0.1)
Basophils Relative: 0 %
Eosinophils Absolute: 0 10*3/uL (ref 0.0–0.5)
Eosinophils Relative: 1 %
HCT: 31.9 % — ABNORMAL LOW (ref 39.0–52.0)
Hemoglobin: 10.9 g/dL — ABNORMAL LOW (ref 13.0–17.0)
Immature Granulocytes: 1 %
Lymphocytes Relative: 12 %
Lymphs Abs: 1 10*3/uL (ref 0.7–4.0)
MCH: 32.2 pg (ref 26.0–34.0)
MCHC: 34.2 g/dL (ref 30.0–36.0)
MCV: 94.4 fL (ref 80.0–100.0)
Monocytes Absolute: 1 10*3/uL (ref 0.1–1.0)
Monocytes Relative: 13 %
Neutro Abs: 6.2 10*3/uL (ref 1.7–7.7)
Neutrophils Relative %: 73 %
Platelets: 149 10*3/uL — ABNORMAL LOW (ref 150–400)
RBC: 3.38 MIL/uL — ABNORMAL LOW (ref 4.22–5.81)
RDW: 12.6 % (ref 11.5–15.5)
WBC: 8.3 10*3/uL (ref 4.0–10.5)
nRBC: 0 % (ref 0.0–0.2)

## 2020-10-28 LAB — URINE CULTURE: Culture: <10000 — AB

## 2020-10-28 LAB — COMPREHENSIVE METABOLIC PANEL
ALT: 28 U/L (ref 0–44)
AST: 32 U/L (ref 15–41)
Albumin: 2.5 g/dL — ABNORMAL LOW (ref 3.5–5.0)
Alkaline Phosphatase: 60 U/L (ref 38–126)
Anion gap: 9 (ref 5–15)
BUN: 12 mg/dL (ref 8–23)
CO2: 27 mmol/L (ref 22–32)
Calcium: 8.2 mg/dL — ABNORMAL LOW (ref 8.9–10.3)
Chloride: 97 mmol/L — ABNORMAL LOW (ref 98–111)
Creatinine, Ser: 0.77 mg/dL (ref 0.61–1.24)
GFR, Estimated: >60 mL/min (ref >60)
Glucose, Bld: 158 mg/dL — ABNORMAL HIGH (ref 70–99)
Potassium: 3.1 mmol/L — ABNORMAL LOW (ref 3.5–5.1)
Sodium: 133 mmol/L — ABNORMAL LOW (ref 135–145)
Total Bilirubin: 0.7 mg/dL (ref 0.3–1.2)
Total Protein: 5.3 g/dL — ABNORMAL LOW (ref 6.5–8.1)

## 2020-10-28 MED ORDER — TRAMADOL HCL 50 MG PO TABS
50.0000 mg | ORAL_TABLET | Freq: Once | ORAL | Status: AC
  Administered 2020-10-28: 50 mg via ORAL
  Filled 2020-10-28: qty 1

## 2020-10-28 MED ORDER — CEPHALEXIN 500 MG PO CAPS: 500.0000 mg | ORAL_CAPSULE | Freq: Two times a day (BID) | ORAL | 0 refills | Status: AC

## 2020-10-28 MED ORDER — ACETAMINOPHEN 325 MG PO TABS: 325.0000 mg | ORAL_TABLET | Freq: Four times a day (QID) | ORAL | Status: AC | PRN

## 2020-10-28 MED ORDER — UMECLIDINIUM BROMIDE 62.5 MCG/INH IN AEPB: 1.0000 | INHALATION_SPRAY | Freq: Every day | RESPIRATORY_TRACT | 2 refills | Status: DC

## 2020-10-28 MED ORDER — CEPHALEXIN 500 MG PO CAPS
500.0000 mg | ORAL_CAPSULE | Freq: Two times a day (BID) | ORAL | Status: DC
  Administered 2020-10-28: 500 mg via ORAL
  Filled 2020-10-28: qty 1

## 2020-10-28 NOTE — Progress Notes (Signed)
Discharge instructions explained to pt/ verbalized an understanding/ iv and tele removed/ will transport off unit when ride arrives.  

## 2020-10-28 NOTE — Discharge Summary (Signed)
Physician Discharge Summary  Parker Martinez KDT:[id number] DOB: 1941-09-11 DOA: 10/26/2020  PCP: Ria Bush, MD  Admit date: 10/26/2020 Discharge date: 10/28/2020  Time spent: 36 minutes  Recommendations for Outpatient Follow-up:  1. Outpatient Chem-12, CBC required on follow-up 2. Outpatient follow-up with Dr. [name] urologist on follow-up to be arranged 3. Complete course of Keflex for complicated UTI secondary to recent urethra instrumentation  Discharge Diagnoses:  Principal Problem:   Sepsis ([place]) Active Problems:   COPD, severe ([place])   GERD (gastroesophageal reflux disease)   Dyslipidemia   DDD (degenerative disc disease), lumbar   Bilateral hearing loss   Discharge Condition: Improved  Diet recommendation: Heart healthy low-salt  Filed Weights   10/26/20 1811 10/27/20 0358 10/28/20 0422  Weight: 67.5 kg 66.4 kg 66.5 kg    History of present illness:  79 year old white male Prostate adeno CA 2009 status post brachytherapy follows Dr. [name] [facility]-last seen 10/04/2020 and had cystoscopy at the time Severe 3-4 COPD continued tobacco dependence Bilateral carotid stenosis Reflux  Presented to PCP as a televisit sent to urgent care and came to ED weakness several days S OB dysuria X 5 days In ED found to have sinus tachycardia WBC 13.2, hemoglobin 12.6 sodium 131 potassium 3.7 BUN/creatinine 19/0.9 troponin XX 2-->18 Lactic acid 2.0-nephrograms at 1.6 Procalcitonin 0.9 COPD changes on x-ray no pneumonia Blood culture 11/23 X2 performed Admitted with concerns for sepsis prior to admission possibly secondary to related instrumentation started ceftriaxone azithromycin  Hospital Course:  1. Sepsis from urinary infection on admission 2. Prostate adeno cancer status post brachytherapy with recent instrumentation a. Admitted and started on ceftriaxone 2 g, urine culture 11/23 = less than 10,000 multiple CFU--transitioned from IV to oral Keflex twice daily to  complete at least 7 days-blood cultures were preliminary negative b. Lactic acidosis from from PTA and sepsis physiology resolved c. Can continue urotraxel 10 daily 3. Stage III-IV COPD and continued tobacco a. Counseled to quit-continue albuterol, Brovana, Pulmicort, DuoNeb 4. Bilateral carotid stenosis a. Outpatient follow-up and screening 5. Reflux a. Continue Protonix 40 daily   Procedures:  Multiple  Consultations:  None  Discharge Exam: Vitals:   10/28/20 0422 10/28/20 0821  BP: 136/75 108/66  Pulse: (!) 105 (!) 102  Resp:  19  Temp: 98.5 F (36.9 C) 97.8 F (36.6 C)  SpO2: 95% 91%   Doing fair-slight headache this morning not relieved by Tylenol-asking for something stronger-usually does not have headaches but does not have any blurred vision double vision or any other issues at this time   General: Awake coherent no distress EOMI NCAT no focal deficit Cardiovascular: S1-S2 no murmur no rub no gallop Respiratory: Clinically clear no added sound rales rhonchi abdomen soft no rebound no guarding  Discharge Instructions   Discharge Instructions    Diet - low sodium heart healthy   Complete by: As directed    Discharge instructions   Complete by: As directed    Only new medication this admission might be an inhaler and your antibiotics Make sure you complete all of your antibiotics-follow-up with Dr. [name] in the outpatient setting and let them know you were admitted You will need lab work in about 1 week's time at primary care physician's office to make sure you are kidney function and other labs look okay   Increase activity slowly   Complete by: As directed      Allergies as of 10/28/2020      Reactions   Flonase [fluticasone Propionate] Other (  See Comments)   Per pt, causes rebound effect       Medication List    STOP taking these medications   diphenhydrAMINE 25 MG tablet Commonly known as: BENADRYL   predniSONE 10 MG tablet Commonly known as:  DELTASONE   predniSONE 20 MG tablet Commonly known as: DELTASONE     TAKE these medications   acetaminophen 325 MG tablet Commonly known as: TYLENOL Take 1 tablet (325 mg total) by mouth every 6 (six) hours as needed for mild pain, fever or headache (or Fever >/= 101).   albuterol 108 (90 Base) MCG/ACT inhaler Commonly known as: VENTOLIN HFA USE 2 PUFFS EVERY 6 HOURS AS NEEDED FOR WHEEZING   alfuzosin 10 MG 24 hr tablet Commonly known as: UROXATRAL Take 10 mg by mouth daily with breakfast.   aspirin 81 MG tablet Take 81 mg by mouth every morning.   atorvastatin 40 MG tablet Commonly known as: LIPITOR Take 1 tablet (40 mg total) by mouth daily.   Breztri Aerosphere 160-9-4.8 MCG/ACT Aero Generic drug: Budeson-Glycopyrrol-Formoterol INHALE 2 PUFFS BY MOUTH TWICE A DAY   cephALEXin 500 MG capsule Commonly known as: KEFLEX Take 1 capsule (500 mg total) by mouth every 12 (twelve) hours for 6 days.   EPINEPHrine 0.3 mg/0.3 mL Soaj injection Commonly known as: EPI-PEN Inject 0.3 mg into the muscle as needed for anaphylaxis.   EPINEPHrine 0.3 mg/0.3 mL Soaj injection Commonly known as: EpiPen 2-Pak Inject 0.3 mLs (0.3 mg total) into the muscle as needed for anaphylaxis.   fexofenadine 180 MG tablet Commonly known as: ALLEGRA Take 180 mg by mouth daily.   ipratropium 0.06 % nasal spray Commonly known as: ATROVENT PLACE 2 SPRAYS INTO BOTH NOSTRILS 4 (FOUR) TIMES DAILY AS NEEDED FOR RHINITIS.   montelukast 10 MG tablet Commonly known as: SINGULAIR Take 1 tablet (10 mg total) by mouth at bedtime.   multivitamin tablet Take 1 tablet by mouth daily.   pantoprazole 40 MG tablet Commonly known as: PROTONIX TAKE 1 TABLET BY MOUTH 2 TIMES DAILY BEFORE A MEAL. 30 MINS BEFORE BREAKFAST AND SUPPER   umeclidinium bromide 62.5 MCG/INH Aepb Commonly known as: INCRUSE ELLIPTA Inhale 1 puff into the lungs daily. Start taking on: October 29, 2020   vitamin B-12 500 MCG  tablet Commonly known as: CYANOCOBALAMIN Take 500 mcg by mouth every morning.      Allergies  Allergen Reactions  . Flonase [Fluticasone Propionate] Other (See Comments)    Per pt, causes rebound effect       The results of significant diagnostics from this hospitalization (including imaging, microbiology, ancillary and laboratory) are listed below for reference.    Significant Diagnostic Studies: DG Lumbar Spine Complete  Result Date: 10/21/2020 CLINICAL DATA:  Right foot drop. Known lumbar radiculopathy. EXAM: LUMBAR SPINE - COMPLETE 4+ VIEW COMPARISON:  Radiograph 01/24/2019 MRI 02/05/2019 FINDINGS: Slight broad-based scoliotic curvature. Straightening of normal lordosis. Vertebral body heights are preserved. No acute or compression fracture. Disc space narrowing with endplate spurring at I9-J1, L3-L4, L4-L5 and L5-S1, most prominent at L3-L4. Mild progressive disc height loss at L1-L2, otherwise no significant interval change. There is L3-L4 and L4-L5 facet hypertrophy. No evidence of focal bone lesion. The sacroiliac joints are congruent. Brachytherapy seeds in the prostate bed. IMPRESSION: 1. Multilevel degenerative disc disease and facet arthropathy, with slight progressive disc height loss at L1-L2 from 2020 comparison. 2. Slight broad-based scoliotic curvature. Electronically Signed   By: Keith Rake M.D.   On: 10/21/2020 11:35  DG Chest Port 1 View  Result Date: 10/26/2020 CLINICAL DATA:  Question sepsis EXAM: PORTABLE CHEST 1 VIEW COMPARISON:  Portable exam 1215 hours compared to 06/17/2020 FINDINGS: Normal heart size, mediastinal contours, and pulmonary vascularity. Atherosclerotic calcification aorta. Emphysematous changes consistent with COPD. No acute infiltrate, pleural effusion, or pneumothorax. Small nodular focus at RIGHT upper lobe 6 mm diameter unchanged since 05/28/2018. No acute osseous findings. IMPRESSION: COPD changes. No acute abnormalities. Aortic  Atherosclerosis (ICD10-I70.0) and Emphysema (ICD10-J43.9). Electronically Signed   By: Lavonia Dana M.D.   On: 10/26/2020 12:39   VAS [place] ABI WITH/WO TBI  Result Date: 10/12/2020 LOWER EXTREMITY DOPPLER STUDY Indications: Claudication, rest pain, peripheral artery disease, and Patient has              a history with Dr. Scot Dock at [facility] most recently 06/19/2018. Patient              has noticed he has bilateral calf pain with right > left. Last 2              weeks have been worse with lower leg swelling and cramping in his              feet at night. He has severe COPD and can not walk more than a few              minutes at a time so unable to determine claudication symptoms. He              states he has rest pain. High Risk Factors: Hypertension, hyperlipidemia, current smoker.  Vascular Interventions: None. Comparison Study: Prior ABI 06/19/2018 right .59 left .28 Performing Technologist: Salvadore Dom RVT  Examination Guidelines: A complete evaluation includes at minimum, Doppler waveform signals and systolic blood pressure reading at the level of bilateral brachial, anterior tibial, and posterior tibial arteries, when vessel segments are accessible. Bilateral testing is considered an integral part of a complete examination. Photoelectric Plethysmograph (PPG) waveforms and toe systolic pressure readings are included as required and additional duplex testing as needed. Limited examinations for reoccurring indications may be performed as noted.  ABI Findings: +---------+------------------+-----+----------+--------+ Right    Rt Pressure (mmHg)IndexWaveform  Comment  +---------+------------------+-----+----------+--------+ Brachial 154                                       +---------+------------------+-----+----------+--------+ ATA      89                0.58 monophasic         +---------+------------------+-----+----------+--------+ PTA      98                0.64 monophasic          +---------+------------------+-----+----------+--------+ PERO     74                0.48 monophasic         +---------+------------------+-----+----------+--------+ Great Toe39                0.25 Abnormal           +---------+------------------+-----+----------+--------+ +---------+------------------+-----+----------+-------+ Left     Lt Pressure (mmHg)IndexWaveform  Comment +---------+------------------+-----+----------+-------+ Brachial 135                                      +---------+------------------+-----+----------+-------+  ATA      83                0.54 monophasic        +---------+------------------+-----+----------+-------+ PTA      143               0.93 monophasic        +---------+------------------+-----+----------+-------+ PERO     90                0.58 monophasic        +---------+------------------+-----+----------+-------+ Great Toe68                0.44 Abnormal          +---------+------------------+-----+----------+-------+ +-------+-----------+-----------+------------+------------+ ABI/TBIToday's ABIToday's TBIPrevious ABIPrevious TBI +-------+-----------+-----------+------------+------------+ Right  .64        .25        .59         .32          +-------+-----------+-----------+------------+------------+ Left   .93        .44        .61         .44          +-------+-----------+-----------+------------+------------+  Bilateral ABIs appear increased compared to prior study on 06/19/2018.  Summary: Right: Resting right ankle-brachial index indicates moderate right lower extremity arterial disease. The right toe-brachial index is abnormal. Left: Resting left ankle-brachial index indicates mild left lower extremity arterial disease. The left toe-brachial index is abnormal.  *See table(s) above for measurements and observations.  Electronically signed by Carlyle Dolly MD on 10/12/2020 at 4:13:41 PM.    Final      Microbiology: Recent Results (from the past 240 hour(s))  Blood Culture (routine x 2)     Status: None (Preliminary result)   Collection Time: 10/26/20 11:57 AM   Specimen: BLOOD  Result Value Ref Range Status   Specimen Description BLOOD LEFT AC  Final   Special Requests   Final    BOTTLES DRAWN AEROBIC AND ANAEROBIC Blood Culture results may not be optimal due to an inadequate volume of blood received in culture bottles   Culture   Final    NO GROWTH 2 DAYS Performed at [facility], [address] [place], [place] [address]    Report Status PENDING  Incomplete  Blood Culture (routine x 2)     Status: None (Preliminary result)   Collection Time: 10/26/20 11:57 AM   Specimen: BLOOD  Result Value Ref Range Status   Specimen Description BLOOD RIGHT [facility]  Final   Special Requests   Final    BOTTLES DRAWN AEROBIC AND ANAEROBIC Blood Culture adequate volume   Culture   Final    NO GROWTH 2 DAYS Performed at [facility], [address] [place], [place] [address]    Report Status PENDING  Incomplete  Resp Panel by RT-PCR (Flu A&B, Covid) Nasopharyngeal Swab     Status: None   Collection Time: 10/26/20 11:57 AM   Specimen: Nasopharyngeal Swab; Nasopharyngeal(NP) swabs in vial transport medium  Result Value Ref Range Status   SARS Coronavirus 2 by RT PCR NEGATIVE NEGATIVE Final    Comment: (NOTE) SARS-CoV-2 target nucleic acids are NOT DETECTED.  The SARS-CoV-2 RNA is generally detectable in upper respiratory specimens during the acute phase of infection. The lowest concentration of SARS-CoV-2 viral copies this assay can detect is 138 copies/mL. A negative result does not preclude SARS-Cov-2 infection and should not be used as the  sole basis for treatment or other patient management decisions. A negative result may occur with  improper specimen collection/handling, submission of specimen other than nasopharyngeal swab, presence of viral mutation(s) within  the areas targeted by this assay, and inadequate number of viral copies(<138 copies/mL). A negative result must be combined with clinical observations, patient history, and epidemiological information. The expected result is Negative.  Fact Sheet for Patients:  EntrepreneurPulse.com.au  Fact Sheet for Healthcare Providers:  IncredibleEmployment.be  This test is no t yet approved or cleared by the [place] FDA and  has been authorized for detection and/or diagnosis of SARS-CoV-2 by FDA under an Emergency Use Authorization (EUA). This EUA will remain  in effect (meaning this test can be used) for the duration of the COVID-19 declaration under Section 564(b)(1) of the Act, 21 U.S.C.section 360bbb-3(b)(1), unless the authorization is terminated  or revoked sooner.       Influenza A by PCR NEGATIVE NEGATIVE Final   Influenza B by PCR NEGATIVE NEGATIVE Final    Comment: (NOTE) The Xpert Xpress SARS-CoV-2/FLU/RSV plus assay is intended as an aid in the diagnosis of influenza from Nasopharyngeal swab specimens and should not be used as a sole basis for treatment. Nasal washings and aspirates are unacceptable for Xpert Xpress SARS-CoV-2/FLU/RSV testing.  Fact Sheet for Patients: EntrepreneurPulse.com.au  Fact Sheet for Healthcare Providers: IncredibleEmployment.be  This test is not yet approved or cleared by the [place] FDA and has been authorized for detection and/or diagnosis of SARS-CoV-2 by FDA under an Emergency Use Authorization (EUA). This EUA will remain in effect (meaning this test can be used) for the duration of the COVID-19 declaration under Section 564(b)(1) of the Act, 21 U.S.C. section 360bbb-3(b)(1), unless the authorization is terminated or revoked.  Performed at [facility], [address] [place], [place] [address]   Urine culture     Status: Abnormal   Collection  Time: 10/26/20 12:08 PM   Specimen: Urine, Random  Result Value Ref Range Status   Specimen Description   Final    URINE, RANDOM Performed at [facility], [address], [place], [place] [address]    Special Requests   Final    NONE Performed at [facility], [address] [place], [place] [address]    Culture (A)  Final    <10,000 COLONIES/mL INSIGNIFICANT GROWTH Performed at [facility], [place] [address] [place], [place] [address]    Report Status 10/28/2020 FINAL  Final     Labs: Basic Metabolic Panel: Recent Labs  Lab 10/26/20 1112 10/27/20 0458 10/28/20 0907  NA 131* 134* 133*  K 3.7 3.6 3.1*  CL 95* 97* 97*  CO2 25 25 27   GLUCOSE 112* 95 158*  BUN 19 14 12   CREATININE 0.98 0.85 0.77  CALCIUM 9.0 8.2* 8.2*   Liver Function Tests: Recent Labs  Lab 10/26/20 1157 10/27/20 0458 10/28/20 0907  AST 22 29 32  ALT 20 22 28   ALKPHOS 58 52 60  BILITOT 1.2 0.9 0.7  PROT 6.2* 5.1* 5.3*  ALBUMIN 3.3* 2.5* 2.5*   No results for input(s): LIPASE, AMYLASE in the last 168 hours. No results for input(s): AMMONIA in the last 168 hours. CBC: Recent Labs  Lab 10/26/20 1112 10/27/20 0458 10/28/20 0907  WBC 13.2* 10.1 8.3  NEUTROABS  --  7.9* 6.2  HGB 12.6* 10.5* 10.9*  HCT 36.3* 30.5* 31.9*  MCV 93.6 94.4 94.4  PLT 191 155 149*   Cardiac Enzymes:  No results for input(s): CKTOTAL, CKMB, CKMBINDEX, TROPONINI in the last 168 hours. BNP: BNP (last 3 results) No results for input(s): BNP in the last 8760 hours.  ProBNP (last 3 results) No results for input(s): PROBNP in the last 8760 hours.  CBG: No results for input(s): GLUCAP in the last 168 hours.     Signed:  Nita Sells MD   [facility] 10/28/2020, 10:24 AM

## 2020-10-31 LAB — CULTURE, BLOOD (ROUTINE X 2)
Culture: NO GROWTH
Culture: NO GROWTH
Special Requests: ADEQUATE

## 2020-11-01 ENCOUNTER — Telehealth: Payer: Self-pay | Admitting: *Deleted

## 2020-11-01 NOTE — Telephone Encounter (Signed)
Agree. Hold higher prednisone taper.  Schedule hosp f/u visit and/or CPE.   Labs otherwise looking good - kidneys, liver, sugar, chol levels. Mild anemia worse since hospitalization ?dilutaion from IVF he received while hospitalized for sepsis. We can recheck this at future labs. Prostate remains stable at 0.01.

## 2020-11-01 NOTE — Telephone Encounter (Signed)
I am deferring to the inpatient team with the med list below.  The discharge summary recommended not taking prednisone, so unless he was advised o/w in the meantime I wouldn't start prednisone.  Thanks.     TAKE these medications   acetaminophen 325 MG tablet Commonly known as: TYLENOL Take 1 tablet (325 mg total) by mouth every 6 (six) hours as needed for mild pain, fever or headache (or Fever >/= 101).   albuterol 108 (90 Base) MCG/ACT inhaler Commonly known as: VENTOLIN HFA USE 2 PUFFS EVERY 6 HOURS AS NEEDED FOR WHEEZING   alfuzosin 10 MG 24 hr tablet Commonly known as: UROXATRAL Take 10 mg by mouth daily with breakfast.   aspirin 81 MG tablet Take 81 mg by mouth every morning.   atorvastatin 40 MG tablet Commonly known as: LIPITOR Take 1 tablet (40 mg total) by mouth daily.   Breztri Aerosphere 160-9-4.8 MCG/ACT Aero Generic drug: Budeson-Glycopyrrol-Formoterol INHALE 2 PUFFS BY MOUTH TWICE A DAY   cephALEXin 500 MG capsule Commonly known as: KEFLEX Take 1 capsule (500 mg total) by mouth every 12 (twelve) hours for 6 days.     EPINEPHrine 0.3 mg/0.3 mL Soaj injection Commonly known as: EpiPen 2-Pak Inject 0.3 mLs (0.3 mg total) into the muscle as needed for anaphylaxis.   fexofenadine 180 MG tablet Commonly known as: ALLEGRA Take 180 mg by mouth daily.   ipratropium 0.06 % nasal spray Commonly known as: ATROVENT PLACE 2 SPRAYS INTO BOTH NOSTRILS 4 (FOUR) TIMES DAILY AS NEEDED FOR RHINITIS.   montelukast 10 MG tablet Commonly known as: SINGULAIR Take 1 tablet (10 mg total) by mouth at bedtime.   multivitamin tablet Take 1 tablet by mouth daily.   pantoprazole 40 MG tablet Commonly known as: PROTONIX TAKE 1 TABLET BY MOUTH 2 TIMES DAILY BEFORE A MEAL. 30 MINS BEFORE BREAKFAST AND SUPPER   umeclidinium bromide 62.5 MCG/INH Aepb Commonly known as: INCRUSE ELLIPTA Inhale 1 puff into the lungs daily. Start taking on: October 29, 2020   vitamin  B-12 500 MCG tablet Commonly known as: CYANOCOBALAMIN Take 500 mcg by mouth every morning.

## 2020-11-01 NOTE — Telephone Encounter (Signed)
Called and spoke with Parker Martinez regarding his medications. Parker Martinez verbalized understanding. Parker Martinez would like Dr. [name] to discuss his recent lab results with him since they did not get the full video visit prior to Parker Martinez having to go to the hospital.

## 2020-11-01 NOTE — Telephone Encounter (Signed)
Patient called stating that he was discharged from the hospital and has questions about his medications. Patient stated that all of his regular medicaitons were stopped when he left the hospital.  Advised patient that it appears he was to continue his regular medications. Patient stated that he was told to call his PCP and confirm that he is to continue his regular medications,  Patient stated that his main question is about the Prednisone dose pak that Dr. [name] prescribed to him before he went to the hospital. Patient stated that he never started it and wants to know if he should start taking it.

## 2020-11-02 NOTE — Telephone Encounter (Addendum)
Spoke with pt relaying Dr. Synthia Innocent message and lab results.  Pt verbalizes understanding and scheduled hosp f/u tomorrow at 12:30.

## 2020-11-03 ENCOUNTER — Telehealth: Payer: Self-pay

## 2020-11-03 ENCOUNTER — Ambulatory Visit (INDEPENDENT_AMBULATORY_CARE_PROVIDER_SITE_OTHER): Payer: PPO | Admitting: Family Medicine

## 2020-11-03 ENCOUNTER — Encounter: Payer: Self-pay | Admitting: Family Medicine

## 2020-11-03 ENCOUNTER — Other Ambulatory Visit: Payer: Self-pay

## 2020-11-03 VITALS — BP 108/60 | HR 88 | Temp 97.9°F | Ht 69.0 in | Wt 148.4 lb

## 2020-11-03 DIAGNOSIS — A419 Sepsis, unspecified organism: Secondary | ICD-10-CM

## 2020-11-03 DIAGNOSIS — J449 Chronic obstructive pulmonary disease, unspecified: Secondary | ICD-10-CM

## 2020-11-03 DIAGNOSIS — M21371 Foot drop, right foot: Secondary | ICD-10-CM

## 2020-11-03 DIAGNOSIS — R000 Tachycardia, unspecified: Secondary | ICD-10-CM

## 2020-11-03 DIAGNOSIS — E876 Hypokalemia: Secondary | ICD-10-CM | POA: Diagnosis not present

## 2020-11-03 DIAGNOSIS — R0602 Shortness of breath: Secondary | ICD-10-CM

## 2020-11-03 DIAGNOSIS — E440 Moderate protein-calorie malnutrition: Secondary | ICD-10-CM

## 2020-11-03 DIAGNOSIS — F17200 Nicotine dependence, unspecified, uncomplicated: Secondary | ICD-10-CM

## 2020-11-03 NOTE — Telephone Encounter (Signed)
Spoke with pt scheduling lab visit tomorrow at 10:00.

## 2020-11-03 NOTE — Progress Notes (Signed)
Patient ID: Parker Martinez, male    DOB: 1941-08-23, 79 y.o.   MRN: [id number]  This visit was conducted in person.  BP 108/60 (BP Location: Left Arm, Patient Position: Sitting, Cuff Size: Normal)   Pulse 88   Temp 97.9 F (36.6 C) (Temporal)   Ht 5\' 9"  (1.753 m)   Wt 148 lb 6 oz (67.3 kg)   SpO2 96%   BMI 21.91 kg/m    CC: hosp f/u visit  Subjective:   HPI: Parker Martinez is a 79 y.o. male presenting on 11/03/2020 for Hospitalization Follow-up (Admitted due to sepsis. )   Seen virtually last week, referred to [facility], then ended up hospitalized with concern for urosepsis after recent cystoscopy (10/04/2020) for known prostate cancer s/p brachytherapy (followed by Dr [name] at [facility]).   Blcx negative x2. UCx insignificant growth.   Received multiple respiratory treatments in the hospital with improvement in respiratory status.  Treated with rocephin 2gm, transitioned to oral keflex 500mg  BID x 1 wk.   He did have dysuria and urgency and frequency that he hadn't previously mentioned. Urinary symptoms have largely improved. Planned f/u with Dr [name] 02/2021 - planning to call to see if needs to schedule sooner appt.   Persistent R foot drop and paresthesias/numnbess to R foot. Upcoming lumbar MRI scheduled for next week.  Remains without appetite. Weight stable.    Admit date: 10/26/2020 Discharge date: 10/28/2020 TCM hosp f/u phone call not completed.   Recommendations for Outpatient Follow-up:  1. Outpatient Chem-12, CBC required on follow-up 2. Outpatient follow-up with Dr. [name] urologist on follow-up to be arranged 3. Complete course of Keflex for complicated UTI secondary to recent urethra instrumentation  Discharge Diagnoses:  Principal Problem:   Sepsis ([place]) Active Problems:   COPD, severe ([place])   GERD (gastroesophageal reflux disease)   Dyslipidemia   DDD (degenerative disc disease), lumbar   Bilateral hearing loss  Discharge Condition:  Improved  Diet recommendation: Heart healthy low-salt     Relevant past medical, surgical, family and social history reviewed and updated as indicated. Interim medical history since our last visit reviewed. Allergies and medications reviewed and updated. Outpatient Medications Prior to Visit  Medication Sig Dispense Refill  . acetaminophen (TYLENOL) 325 MG tablet Take 1 tablet (325 mg total) by mouth every 6 (six) hours as needed for mild pain, fever or headache (or Fever >/= 101).    Marland Kitchen albuterol (VENTOLIN HFA) 108 (90 Base) MCG/ACT inhaler USE 2 PUFFS EVERY 6 HOURS AS NEEDED FOR WHEEZING 18 g 3  . alfuzosin (UROXATRAL) 10 MG 24 hr tablet Take 10 mg by mouth daily with breakfast.    . aspirin 81 MG tablet Take 81 mg by mouth every morning.     Marland Kitchen atorvastatin (LIPITOR) 40 MG tablet Take 1 tablet (40 mg total) by mouth daily. 90 tablet 3  . BREZTRI AEROSPHERE 160-9-4.8 MCG/ACT AERO INHALE 2 PUFFS BY MOUTH TWICE A DAY 10.7 g 11  . cephALEXin (KEFLEX) 500 MG capsule Take 1 capsule (500 mg total) by mouth every 12 (twelve) hours for 6 days. 12 capsule 0  . EPINEPHrine (EPIPEN 2-PAK) 0.3 mg/0.3 mL IJ SOAJ injection Inject 0.3 mLs (0.3 mg total) into the muscle as needed for anaphylaxis. 1 each 1  . EPINEPHrine 0.3 mg/0.3 mL IJ SOAJ injection Inject 0.3 mg into the muscle as needed for anaphylaxis.     . fexofenadine (ALLEGRA) 180 MG tablet Take 180 mg by mouth daily.    Marland Kitchen  ipratropium (ATROVENT) 0.06 % nasal spray PLACE 2 SPRAYS INTO BOTH NOSTRILS 4 (FOUR) TIMES DAILY AS NEEDED FOR RHINITIS. 15 mL 3  . montelukast (SINGULAIR) 10 MG tablet Take 1 tablet (10 mg total) by mouth at bedtime. 90 tablet 3  . Multiple Vitamin (MULTIVITAMIN) tablet Take 1 tablet by mouth daily.    . pantoprazole (PROTONIX) 40 MG tablet TAKE 1 TABLET BY MOUTH 2 TIMES DAILY BEFORE A MEAL. 30 MINS BEFORE BREAKFAST AND SUPPER 180 tablet 0  . vitamin B-12 (CYANOCOBALAMIN) 500 MCG tablet Take 500 mcg by mouth every morning.     .  umeclidinium bromide (INCRUSE ELLIPTA) 62.5 MCG/INH AEPB Inhale 1 puff into the lungs daily. 30 each 2   No facility-administered medications prior to visit.     Per HPI unless specifically indicated in ROS section below Review of Systems Objective:  BP 108/60 (BP Location: Left Arm, Patient Position: Sitting, Cuff Size: Normal)   Pulse 88   Temp 97.9 F (36.6 C) (Temporal)   Ht 5\' 9"  (1.753 m)   Wt 148 lb 6 oz (67.3 kg)   SpO2 96%   BMI 21.91 kg/m   Wt Readings from Last 3 Encounters:  11/03/20 148 lb 6 oz (67.3 kg)  10/28/20 146 lb 9.6 oz (66.5 kg)  10/26/20 147 lb (66.7 kg)      Physical Exam Vitals and nursing note reviewed.  Constitutional:      Appearance: Normal appearance. He is not ill-appearing.  Cardiovascular:     Rate and Rhythm: Normal rate and regular rhythm.     Pulses: Normal pulses.     Heart sounds: Murmur (3/6 systolic best at LUSB) heard.   Pulmonary:     Effort: Pulmonary effort is normal. No respiratory distress.     Breath sounds: Normal breath sounds. No wheezing, rhonchi or rales.     Comments: Lungs sound clear today Musculoskeletal:     Right lower leg: No edema.     Left lower leg: No edema.  Skin:    General: Skin is warm and dry.     Findings: No rash.  Neurological:     Mental Status: He is alert.  Psychiatric:        Mood and Affect: Mood normal.        Behavior: Behavior normal.       Lab Results  Component Value Date   CREATININE 0.77 10/28/2020   BUN 12 10/28/2020   NA 133 (L) 10/28/2020   K 3.1 (L) 10/28/2020   CL 97 (L) 10/28/2020   CO2 27 10/28/2020    Lab Results  Component Value Date   WBC 8.3 10/28/2020   HGB 10.9 (L) 10/28/2020   HCT 31.9 (L) 10/28/2020   MCV 94.4 10/28/2020   PLT 149 (L) 10/28/2020    Assessment & Plan:  This visit occurred during the SARS-CoV-2 public health emergency.  Safety protocols were in place, including screening questions prior to the visit, additional usage of staff PPE, and  extensive cleaning of exam room while observing appropriate contact time as indicated for disinfecting solutions.   Problem List Items Addressed This Visit    Tachycardia    Due to sepsis - this has resolved.       Smoker    Continue to encourage full cessation      Shortness of breath    Improved with respiratory treatments while hospitalized.      Sepsis ([place]) - Primary    Presumed urosepsis treated with  rocephin IM then keflex course. UCx, blcx x2 negative. Has recovered well from this.  He will call urology to ask about f/u plan      Relevant Orders   CBC with Differential/Platelet   Comprehensive metabolic panel   Right foot drop    Persistent R foot drop with R foot paresthesias/numbness - pending MRI next week.       Protein-calorie malnutrition (HCC)    Low albumin and protein levels noted - rec renewed efforts at protein with every meal, consider adding boost or ensure supplement as orexigenic.       Hypokalemia    Update CMP.       COPD, severe ([place])    Planned pulm f/u later this month.           No orders of the defined types were placed in this encounter.  Orders Placed This Encounter  Procedures  . CBC with Differential/Platelet    Standing Status:   Future    Number of Occurrences:   1    Standing Expiration Date:   11/03/2021  . Comprehensive metabolic panel    Standing Status:   Future    Number of Occurrences:   1    Standing Expiration Date:   11/03/2021    Patient Instructions  Labs today  I'm glad you're feeling better Try to get a protein with each meal, may do boost or ensure.  Touch base with urology to see if Dr [name] wants to see you sooner.  Schedule physical at next available appointment - no need for labs before.    Follow up plan: Return if symptoms worsen or fail to improve.  Ria Bush, MD

## 2020-11-03 NOTE — Patient Instructions (Addendum)
Labs today  I'm glad you're feeling better Try to get a protein with each meal, may do boost or ensure.  Touch base with urology to see if Dr [name] wants to see you sooner.  Schedule physical at next available appointment - no need for labs before.

## 2020-11-03 NOTE — Telephone Encounter (Signed)
Pt left v/m for [facility]; pt was seen earlier today by Dr [name]. Pt left v/m that the cross train xray tech tried x 2 today to draw pts blood but was not successful. Pt wants to know if Parker Martinez can schedule pt to come to [facility] on 11/04/20 to have labs done. Pt request cb.

## 2020-11-04 ENCOUNTER — Other Ambulatory Visit (INDEPENDENT_AMBULATORY_CARE_PROVIDER_SITE_OTHER): Payer: PPO

## 2020-11-04 DIAGNOSIS — E46 Unspecified protein-calorie malnutrition: Secondary | ICD-10-CM | POA: Insufficient documentation

## 2020-11-04 DIAGNOSIS — A419 Sepsis, unspecified organism: Secondary | ICD-10-CM | POA: Diagnosis not present

## 2020-11-04 DIAGNOSIS — E876 Hypokalemia: Secondary | ICD-10-CM | POA: Insufficient documentation

## 2020-11-04 LAB — CBC WITH DIFFERENTIAL/PLATELET
Basophils Absolute: 0.1 10*3/uL (ref 0.0–0.1)
Basophils Relative: 0.6 % (ref 0.0–3.0)
Eosinophils Absolute: 0.2 10*3/uL (ref 0.0–0.7)
Eosinophils Relative: 1.7 % (ref 0.0–5.0)
HCT: 38 % — ABNORMAL LOW (ref 39.0–52.0)
Hemoglobin: 12.6 g/dL — ABNORMAL LOW (ref 13.0–17.0)
Lymphocytes Relative: 28.5 % (ref 12.0–46.0)
Lymphs Abs: 3.2 10*3/uL (ref 0.7–4.0)
MCHC: 33.2 g/dL (ref 30.0–36.0)
MCV: 94.4 fl (ref 78.0–100.0)
Monocytes Absolute: 0.9 10*3/uL (ref 0.1–1.0)
Monocytes Relative: 7.8 % (ref 3.0–12.0)
Neutro Abs: 6.9 10*3/uL (ref 1.4–7.7)
Neutrophils Relative %: 61.4 % (ref 43.0–77.0)
Platelets: 441 10*3/uL — ABNORMAL HIGH (ref 150.0–400.0)
RBC: 4.03 Mil/uL — ABNORMAL LOW (ref 4.22–5.81)
RDW: 13.4 % (ref 11.5–15.5)
WBC: 11.2 10*3/uL — ABNORMAL HIGH (ref 4.0–10.5)

## 2020-11-04 LAB — COMPREHENSIVE METABOLIC PANEL
ALT: 37 U/L (ref 0–53)
AST: 25 U/L (ref 0–37)
Albumin: 3.4 g/dL — ABNORMAL LOW (ref 3.5–5.2)
Alkaline Phosphatase: 73 U/L (ref 39–117)
BUN: 9 mg/dL (ref 6–23)
CO2: 33 mEq/L — ABNORMAL HIGH (ref 19–32)
Calcium: 9.4 mg/dL (ref 8.4–10.5)
Chloride: 103 mEq/L (ref 96–112)
Creatinine, Ser: 0.79 mg/dL (ref 0.40–1.50)
GFR: 83.95 mL/min (ref >60.00)
Glucose, Bld: 76 mg/dL (ref 70–99)
Potassium: 4.6 mEq/L (ref 3.5–5.1)
Sodium: 145 mEq/L (ref 135–145)
Total Bilirubin: 0.3 mg/dL (ref 0.2–1.2)
Total Protein: 6.1 g/dL (ref 6.0–8.3)

## 2020-11-04 NOTE — Assessment & Plan Note (Signed)
Update CMP.  

## 2020-11-04 NOTE — Assessment & Plan Note (Signed)
Due to sepsis - this has resolved.

## 2020-11-04 NOTE — Assessment & Plan Note (Signed)
Improved with respiratory treatments while hospitalized.

## 2020-11-04 NOTE — Assessment & Plan Note (Addendum)
Presumed urosepsis treated with rocephin IM then keflex course. UCx, blcx x2 negative. Has recovered well from this.  He will call urology to ask about f/u plan

## 2020-11-04 NOTE — Assessment & Plan Note (Signed)
Continue to encourage full cessation

## 2020-11-04 NOTE — Assessment & Plan Note (Signed)
Planned pulm f/u later this month.

## 2020-11-04 NOTE — Assessment & Plan Note (Signed)
Low albumin and protein levels noted - rec renewed efforts at protein with every meal, consider adding boost or ensure supplement as orexigenic.

## 2020-11-04 NOTE — Assessment & Plan Note (Addendum)
Persistent R foot drop with R foot paresthesias/numbness - pending MRI next week.

## 2020-11-05 ENCOUNTER — Telehealth: Payer: Self-pay | Admitting: Pulmonary Disease

## 2020-11-05 DIAGNOSIS — J449 Chronic obstructive pulmonary disease, unspecified: Secondary | ICD-10-CM

## 2020-11-05 NOTE — Telephone Encounter (Addendum)
Patient is aware of below results and voiced his understanding. Order has been placed to [place].   He wanted to make Dr. [name] aware that he was recently admitted. During admission, he was given nebulizer solutions instead Breztri. He is now back [place].   Routing to Dr. [name] to make aware.

## 2020-11-05 NOTE — Telephone Encounter (Signed)
ONO reviewed by Dr. Dietrich Pates 2L QHS.   Lm for patient.

## 2020-11-05 NOTE — Telephone Encounter (Signed)
Usually when patients are admitted they are put on DuoNebs as Parker Martinez is nonformulary.  Thanks for info.

## 2020-11-05 NOTE — Telephone Encounter (Signed)
Noted  

## 2020-11-08 ENCOUNTER — Other Ambulatory Visit: Payer: Self-pay

## 2020-11-08 ENCOUNTER — Ambulatory Visit
Admission: RE | Admit: 2020-11-08 | Discharge: 2020-11-08 | Disposition: A | Discharge status: Home / Self Care | Payer: PPO | Source: Ambulatory Visit | Attending: Family Medicine | Admitting: Family Medicine

## 2020-11-08 DIAGNOSIS — M5127 Other intervertebral disc displacement, lumbosacral region: Secondary | ICD-10-CM | POA: Diagnosis not present

## 2020-11-08 DIAGNOSIS — M21371 Foot drop, right foot: Secondary | ICD-10-CM | POA: Insufficient documentation

## 2020-11-08 DIAGNOSIS — M5126 Other intervertebral disc displacement, lumbar region: Secondary | ICD-10-CM | POA: Diagnosis not present

## 2020-11-08 DIAGNOSIS — M48061 Spinal stenosis, lumbar region without neurogenic claudication: Secondary | ICD-10-CM | POA: Diagnosis not present

## 2020-11-08 MED ORDER — GADOBUTROL 1 MMOL/ML IV SOLN
6.0000 mL | Freq: Once | INTRAVENOUS | Status: AC | PRN
  Administered 2020-11-08: 6 mL via INTRAVENOUS

## 2020-11-09 ENCOUNTER — Other Ambulatory Visit: Payer: Self-pay

## 2020-11-10 ENCOUNTER — Telehealth: Payer: Self-pay | Admitting: Pulmonary Disease

## 2020-11-10 NOTE — Telephone Encounter (Signed)
Lm for Caryl Pina with [employer].

## 2020-11-10 NOTE — Telephone Encounter (Signed)
Per Caryl Pina, this matter has been discussed with Rodena Piety and nothing further is needed.

## 2020-11-11 ENCOUNTER — Other Ambulatory Visit: Payer: Self-pay

## 2020-11-11 ENCOUNTER — Encounter: Payer: Self-pay | Admitting: Internal Medicine

## 2020-11-11 ENCOUNTER — Ambulatory Visit (INDEPENDENT_AMBULATORY_CARE_PROVIDER_SITE_OTHER): Payer: PPO | Admitting: Internal Medicine

## 2020-11-11 ENCOUNTER — Telehealth: Payer: Self-pay

## 2020-11-11 VITALS — BP 110/60 | HR 130 | Temp 98.0°F | Wt 149.0 lb

## 2020-11-11 DIAGNOSIS — R300 Dysuria: Secondary | ICD-10-CM

## 2020-11-11 DIAGNOSIS — R350 Frequency of micturition: Secondary | ICD-10-CM | POA: Diagnosis not present

## 2020-11-11 LAB — POC URINALSYSI DIPSTICK (AUTOMATED)
Bilirubin, UA: NEGATIVE
Glucose, UA: NEGATIVE
Nitrite, UA: NEGATIVE
Protein, UA: POSITIVE — AB
Spec Grav, UA: 1.015 (ref 1.010–1.025)
Urobilinogen, UA: 0.2 E.U./dL
pH, UA: 6 (ref 5.0–8.0)

## 2020-11-11 MED ORDER — SULFAMETHOXAZOLE-TRIMETHOPRIM 400-80 MG PO TABS: 1.0000 | ORAL_TABLET | Freq: Two times a day (BID) | ORAL | 0 refills | Status: DC

## 2020-11-11 NOTE — Progress Notes (Signed)
HPI  Pt presents to the clinic today with c/o urinary frequency and dysuria. This started yesterday. He has noticed some dribbling but denies urgency, blood in his urine, penile discharge. He denies fever, chills, nausea or low back pain. He has not taken anything OTC for this.  He was hospitalized 10/2020 for urosepsis.   Review of Systems  Past Medical History:  Diagnosis Date  . Abnormal drug screen 10/2013, 03/2015   low Cr, +EtOH, neg hydrocodone (10/2013, again 03/2015)  . Allergic rhinitis   . Arthritis    lumbar and cervical spine  . Carotid stenosis 09/11/2014   40-59% bilat ICA stenosis, rpt 1 yr (09/2014)   . COPD (chronic obstructive pulmonary disease) (HCC)    severe, participated in [place] study  . DDD (degenerative disc disease)    lumbar (HNP R L3/4) s/p ESI, cervical  . Diverticulitis 2015   by CT  . Diverticulosis 2008   severe by colonoscopy and CT  . Duodenitis   . Esophageal stricture 2012   s/p dilation  . GERD (gastroesophageal reflux disease)   . Hemangioma    congenital left arm  . History of kidney stones remote  . HTN (hypertension)    borderline readings at home  . Idiopathic anaphylaxis    h/o recurrent anaphylaxis, unknown trigger, s/p eval at [facility] [phone]), last episode 03/2011  . PAD (peripheral artery disease) ([place])   . Personal history of colonic adenomas 09/02/2013  . Prostate cancer [facility]) 2011   prostate seed implant (Dr. [name])  . Smoker   . Status post dilatation of esophageal stricture   . Subclavian steal syndrome 10/12/2016   Left subclavian steal, with 18 mmHg brachial artery pressure gradient (10/2016). If symptomatic, rec PV consult    Family History  Problem Relation Age of Onset  . Lung cancer Father   . Hyperlipidemia Father   . Arthritis Mother   . Breast cancer Daughter   . Coronary artery disease Neg Hx   . Stroke Neg Hx   . Diabetes Neg Hx     Social History   Socioeconomic History  . Marital  status: Married    Spouse name: Not on file  . Number of children: 1  . Years of education: Not on file  . Highest education level: Not on file  Occupational History  . Occupation: retired    Fish farm manager: RETIRED  Tobacco Use  . Smoking status: Current Every Day Smoker    Packs/day: 1.00    Years: 60.00    Pack years: 60.00    Types: Cigarettes  . Smokeless tobacco: Never Used  . Tobacco comment: 0-1/2 ppd/ 10/07/20  Vaping Use  . Vaping Use: Never used  Substance and Sexual Activity  . Alcohol use: Yes    Alcohol/week: 14.0 standard drinks    Types: 14 Cans of beer per week    Comment: socially  . Drug use: No  . Sexual activity: Not on file  Other Topics Concern  . 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 Wife is Ladean Raya. "no extraordinary efforts" does not want feeding tube.      Tested negative for hydrocodone and  positive for alcohol on UDS 10/2013.  Recommend Q6 mo testing and will need to discuss EtOH use at next visit.   Social Determinants of Health   Financial Resource Strain: Low Risk   . Difficulty of Paying Living Expenses: Not hard at all  Food Insecurity: No Food Insecurity  . Worried About Charity fundraiser in the Last Year: Never true  . Ran Out of Food in the Last Year: Never true  Transportation Needs: No Transportation Needs  . Lack of Transportation (Medical): No  . Lack of Transportation (Non-Medical): No  Physical Activity: Sufficiently Active  . Days of Exercise per Week: 7 days  . Minutes of Exercise per Session: 30 min  Stress: No Stress Concern Present  . Feeling of Stress : Not at all  Social Connections: Not on file  Intimate  Partner Violence: Not At Risk  . Fear of Current or Ex-Partner: No  . Emotionally Abused: No  . Physically Abused: No  . Sexually Abused: No    Allergies  Allergen Reactions  . Flonase [Fluticasone Propionate] Other (See Comments)    Per pt, causes rebound effect      Constitutional: Denies fever, malaise, fatigue, headache or abrupt weight changes.   GU: Pt reports frequency and pain with urination. Denies urgency, burning sensation, blood in urine, odor or discharge.   No other specific complaints in a complete review of systems (except as listed in HPI above).    Objective:   Physical Exam BP 110/60   Pulse (!) 130   Temp 98 F (36.7 C) (Temporal)   Wt 149 lb (67.6 kg)   SpO2 98%   BMI 22.00 kg/m   Wt Readings from Last 3 Encounters:  11/03/20 148 lb 6 oz (67.3 kg)  10/28/20 146 lb 9.6 oz (66.5 kg)  10/26/20 147 lb (66.7 kg)    General: Appears her stated age, well developed, well nourished in NAD. Cardiovascular: Tachycardic with normal rhythm. S1,S2 noted.   Pulmonary/Chest: Normal effort and scattered wheezing and rhonchi throughout.  Abdomen: Soft. Normal bowel sounds. No distention or masses noted.  Tender to palpation over the bladder area. No CVA tenderness.        Assessment & Plan:   Frequency, Dysuria:  Urinalysis: 3+ leuks, 3+ blood Will send urine culture eRx sent if for Bactrim 400-80 mg BID x 7 days Drink plenty of fluids  RTC as needed or if symptoms persist. Webb Silversmith, NP This visit occurred during the SARS-CoV-2 public health emergency.  Safety protocols were in place, including screening questions prior to the visit, additional usage of staff PPE, and extensive cleaning of exam room while observing appropriate contact time as indicated for disinfecting solutions.

## 2020-11-11 NOTE — Patient Instructions (Signed)
Urinary Tract Infection, Adult A urinary tract infection (UTI) is an infection of any part of the urinary tract. The urinary tract includes:  The kidneys.  The ureters.  The bladder.  The urethra. These organs make, store, and get rid of pee (urine) in the body. What are the causes? This is caused by germs (bacteria) in your genital area. These germs grow and cause swelling (inflammation) of your urinary tract. What increases the risk? You are more likely to develop this condition if:  You have a small, thin tube (catheter) to drain pee.  You cannot control when you pee or poop (incontinence).  You are male, and: ? You use these methods to prevent pregnancy:  A medicine that kills sperm (spermicide).  A device that blocks sperm (diaphragm). ? You have low levels of a male hormone (estrogen). ? You are pregnant.  You have genes that add to your risk.  You are sexually active.  You take antibiotic medicines.  You have trouble peeing because of: ? A prostate that is bigger than normal, if you are male. ? A blockage in the part of your body that drains pee from the bladder (urethra). ? A kidney stone. ? A nerve condition that affects your bladder (neurogenic bladder). ? Not getting enough to drink. ? Not peeing often enough.  You have other conditions, such as: ? Diabetes. ? A weak disease-fighting system (immune system). ? Sickle cell disease. ? Gout. ? Injury of the spine. What are the signs or symptoms? Symptoms of this condition include:  Needing to pee right away (urgently).  Peeing often.  Peeing small amounts often.  Pain or burning when peeing.  Blood in the pee.  Pee that smells bad or not like normal.  Trouble peeing.  Pee that is cloudy.  Fluid coming from the vagina, if you are male.  Pain in the belly or lower back. Other symptoms include:  Throwing up (vomiting).  No urge to eat.  Feeling mixed up (confused).  Being tired  and grouchy (irritable).  A fever.  Watery poop (diarrhea). How is this treated? This condition may be treated with:  Antibiotic medicine.  Other medicines.  Drinking enough water. Follow these instructions at home:  Medicines  Take over-the-counter and prescription medicines only as told by your doctor.  If you were prescribed an antibiotic medicine, take it as told by your doctor. Do not stop taking it even if you start to feel better. General instructions  Make sure you: ? Pee until your bladder is empty. ? Do not hold pee for a long time. ? Empty your bladder after sex. ? Wipe from front to back after pooping if you are a male. Use each tissue one time when you wipe.  Drink enough fluid to keep your pee pale yellow.  Keep all follow-up visits as told by your doctor. This is important. Contact a doctor if:  You do not get better after 1-2 days.  Your symptoms go away and then come back. Get help right away if:  You have very bad back pain.  You have very bad pain in your lower belly.  You have a fever.  You are sick to your stomach (nauseous).  You are throwing up. Summary  A urinary tract infection (UTI) is an infection of any part of the urinary tract.  This condition is caused by germs in your genital area.  There are many risk factors for a UTI. These include having a small, thin   tube to drain pee and not being able to control when you pee or poop.  Treatment includes antibiotic medicines for germs.  Drink enough fluid to keep your pee pale yellow. This information is not intended to replace advice given to you by your health care provider. Make sure you discuss any questions you have with your health care provider. Document Revised: 11/07/2018 Document Reviewed: 05/30/2018 Elsevier Patient Education  2020 Elsevier Inc.  

## 2020-11-11 NOTE — Telephone Encounter (Signed)
Pt was released from hospital 10-27-20 for Sepsis from UTI. He is starting to have frequent urination, dysuria, and lower abdominal pain. Very similar to how he felt last month.   I was able to get him in with [facility] this afternoon. I advised him if his symptoms worsen including a fever, he needed to go to the ER.

## 2020-11-11 NOTE — Addendum Note (Signed)
Addended by: Lurlean Nanny on: 11/11/2020 04:02 PM   Modules accepted: Orders

## 2020-11-13 LAB — URINE CULTURE
MICRO NUMBER:: 11298077
SPECIMEN QUALITY:: ADEQUATE

## 2020-11-17 ENCOUNTER — Other Ambulatory Visit: Payer: Self-pay

## 2020-11-17 ENCOUNTER — Ambulatory Visit: Payer: PPO | Admitting: Pulmonary Disease

## 2020-11-17 ENCOUNTER — Encounter: Payer: Self-pay | Admitting: Pulmonary Disease

## 2020-11-17 VITALS — BP 118/70 | HR 145 | Temp 98.0°F | Ht 69.0 in | Wt 143.8 lb

## 2020-11-17 DIAGNOSIS — J439 Emphysema, unspecified: Secondary | ICD-10-CM

## 2020-11-17 DIAGNOSIS — F17210 Nicotine dependence, cigarettes, uncomplicated: Secondary | ICD-10-CM

## 2020-11-17 DIAGNOSIS — J449 Chronic obstructive pulmonary disease, unspecified: Secondary | ICD-10-CM | POA: Diagnosis not present

## 2020-11-17 DIAGNOSIS — G4736 Sleep related hypoventilation in conditions classified elsewhere: Secondary | ICD-10-CM | POA: Diagnosis not present

## 2020-11-17 DIAGNOSIS — R0600 Dyspnea, unspecified: Secondary | ICD-10-CM | POA: Diagnosis not present

## 2020-11-17 MED ORDER — ALBUTEROL SULFATE (2.5 MG/3ML) 0.083% IN NEBU: 2.5000 mg | INHALATION_SOLUTION | RESPIRATORY_TRACT | 2 refills | Status: DC | PRN

## 2020-11-17 MED ORDER — BREZTRI AEROSPHERE 160-9-4.8 MCG/ACT IN AERO: 2.0000 | INHALATION_SPRAY | Freq: Two times a day (BID) | RESPIRATORY_TRACT | 0 refills | Status: DC

## 2020-11-17 NOTE — Patient Instructions (Signed)
Continue Breztri twice a day  Have been provided a spacer to help get the Breztri deeper into your lungs  Team using albuterol as needed, you have been provided albuterol via nebulizer that you can use while you are at home, use your inhaler if you are out of the home.  We will see him in follow-up in 3 months time

## 2020-11-17 NOTE — Progress Notes (Signed)
Subjective:    Patient ID: Parker Martinez, male    DOB: 1941/03/05, 79 y.o.   MRN: [id number]  PULMONARY OFFICE FOLLOW-UP NOTE  Requesting MD/Service:Javier [name], MD Date of initial consultation:06/03/19by Dr. Merton Border Reason for consultation:Severe COPD, smoker  PT PROFILE: 78y.o.maleformersmoker (previously > 1 PPD,with diagnosis of COPD rendered several years ago and with progressive DOE over past 5-7 years  DATA: 10/19/10 Spirometry:Severe obstruction (FEV1 41% predicted) 05/28/18 PFTs: FVC: 2.13 >2.96 L (58 >80 %pred), FEV1: 0.85 >1.04 L (30 >36 %pred), FEV1/FVC: 40 >35%, TLC: 7.90 L (128 %pred), DLCO 50 %pred. Flow volume curve consistent with severe obstruction 02/20/19 PFTs:FEV1 was 1.96 L or 48% predicted, FVC was 2.72 L or 95% predicted, FEV1/FVC was 38%. TLC was 131%, RV 165%, indicating hyperinflation and air trapping. Diffusion capacity was 48%. Findings consistent with severe obstruction and emphysema. 05/05/20 Overnight oximetry: Shows nocturnal desaturations and possible obstructive sleep apnea.  Patient will need sleep study which had been scheduled but patient declined. 06/01/20 2D echo: LVEF 60 to 65%, no evidence of cor pulmonale, mild aortic sclerosis, diastolic dysfunction type I.  INTERVAL: Last visit11/03/2020 withme. Admitted 11/23 through 10/28/2020 for COPD exacerbation.  Unfortunately continues to smoke.  No new issues since admission.He is compliant with oxygen at nighttime and feels that this helps him.   HPI As noted above, patient was admitted on the latter part of November for COPD exacerbation.  Continues to have issues with tachycardia which occurs with any exertion.  No chest pain Review of Systems A 10 point review of systems was performed and it is as noted above otherwise negative.  Patient Active Problem List   Diagnosis Date Noted  . Protein-calorie malnutrition ([place]) 11/04/2020  . Hypokalemia  11/04/2020  . Shortness of breath 10/26/2020  . Sepsis ([place]) 10/26/2020  . Tachycardia 10/20/2020  . Right foot drop 09/21/2020  . Swelling of right foot 09/07/2020  . Aorto-iliac atherosclerosis ([place]) 01/25/2019  . Left lumbar radiculopathy 01/24/2019  . Bilateral hearing loss 10/21/2018  . BPV (benign positional vertigo), right 09/09/2018  . Heart murmur 09/09/2018  . Encounter for chronic pain management 05/15/2017  . Chronic pain in left shoulder 01/30/2017  . Subclavian steal syndrome 10/12/2016  . Abnormal drug screen   . Atherosclerosis of native arteries of extremity with intermittent claudication ([place]) 10/21/2014  . Medicare annual wellness visit, subsequent 09/11/2014  . Carotid stenosis 09/11/2014  . Advanced care planning/counseling discussion 09/11/2014  . Vitamin D deficiency 09/01/2014  . DDD (degenerative disc disease), lumbar   . Personal history of colonic adenomas 09/02/2013  . COPD exacerbation ([place]) 03/04/2013  . Healthcare maintenance 04/25/2012  . Polycythemia secondary to smoking 04/25/2012  . Dyslipidemia 04/25/2012  . HTN (hypertension)   . PVD (peripheral vascular disease) with claudication ([place])   . COPD, severe ([place])   . Prostate cancer ([place])   . GERD (gastroesophageal reflux disease)   . Idiopathic anaphylaxis   . Smoker 09/13/2011  . Esophageal rings - lower esophagus 06/04/2009   Allergies  Allergen Reactions  . Flonase [Fluticasone Propionate] Other (See Comments)    Per pt, causes rebound effect    Current Meds  Medication Sig  . acetaminophen (TYLENOL) 325 MG tablet Take 1 tablet (325 mg total) by mouth every 6 (six) hours as needed for mild pain, fever or headache (or Fever >/= 101).  Marland Kitchen albuterol (VENTOLIN HFA) 108 (90 Base) MCG/ACT inhaler USE 2 PUFFS EVERY 6 HOURS AS NEEDED FOR WHEEZING  . alfuzosin (UROXATRAL)  10 MG 24 hr tablet Take 10 mg by mouth daily with breakfast.  . aspirin 81 MG tablet Take 81 mg by mouth every morning.  Marland Kitchen  BREZTRI AEROSPHERE 160-9-4.8 MCG/ACT AERO INHALE 2 PUFFS BY MOUTH TWICE A DAY  . EPINEPHrine (EPIPEN 2-PAK) 0.3 mg/0.3 mL IJ SOAJ injection Inject 0.3 mLs (0.3 mg total) into the muscle as needed for anaphylaxis.  Marland Kitchen EPINEPHrine 0.3 mg/0.3 mL IJ SOAJ injection Inject 0.3 mg into the muscle as needed for anaphylaxis.   . fexofenadine (ALLEGRA) 180 MG tablet Take 180 mg by mouth daily.  Marland Kitchen ipratropium (ATROVENT) 0.06 % nasal spray PLACE 2 SPRAYS INTO BOTH NOSTRILS 4 (FOUR) TIMES DAILY AS NEEDED FOR RHINITIS.  Marland Kitchen Multiple Vitamin (MULTIVITAMIN) tablet Take 1 tablet by mouth daily.  . vitamin B-12 (CYANOCOBALAMIN) 500 MCG tablet Take 500 mcg by mouth every morning.   . [DISCONTINUED] atorvastatin (LIPITOR) 40 MG tablet Take 1 tablet (40 mg total) by mouth daily.  . [DISCONTINUED] montelukast (SINGULAIR) 10 MG tablet Take 1 tablet (10 mg total) by mouth at bedtime.  . [DISCONTINUED] pantoprazole (PROTONIX) 40 MG tablet TAKE 1 TABLET BY MOUTH 2 TIMES DAILY BEFORE A MEAL. [place] AND SUPPER  . [DISCONTINUED] predniSONE (DELTASONE) 10 MG tablet Take 1 tablet (10 mg total) by mouth daily with breakfast.  . [DISCONTINUED] sulfamethoxazole-trimethoprim (BACTRIM) 400-80 MG tablet Take 1 tablet by mouth 2 (two) times daily.   Immunization History  Administered Date(s) Administered  . Fluad Quad(high Dose 65+) 09/09/2019, 09/07/2020  . Influenza,inj,Quad PF,6+ Mos 09/11/2014, 09/24/2015, 09/20/2016, 09/14/2017, 08/29/2018  . PFIZER(Purple Top)SARS-COV-2 Vaccination 12/20/2019, 01/10/2020, 09/05/2020  . Pneumococcal Conjugate-13 09/11/2014  . Pneumococcal Polysaccharide-23 12/04/2009       Objective:   Physical Exam BP 118/70 (BP Location: Left Arm, Cuff Size: Normal)   Pulse (!) 145   Temp 98 F (36.7 C) (Temporal)   Ht 5\' 9"  (1.753 m)   Wt 143 lb 12.8 oz (65.2 kg)   SpO2 96%   BMI 21.24 kg/m  Recheck pulse rate 110 GENERAL: Chronically ill appearing elderly male, well groomed, no  acute distress.  Milduse of accessories, chronic.  Mild conversational. Presents in transport chair (due to dyspnea). HEAD: Normocephalic, atraumatic.  EYES: Pupils equal, round, reactive to light. No scleral icterus.  MOUTH: Nose/mouth/throat not examined due to masking requirements for COVID 19. NECK: Supple. No thyromegaly. Trachea midline. No JVD. No adenopathy. PULMONARY:Distant breath sounds, coarse breath sounds with no other adventitious sounds. CARDIOVASCULAR: S1 and S2. Tachycardic rate with regular rhythm.Grade 1/6 to 2/6 systolic ejection murmur left sternal border. GASTROINTESTINAL: Benign. MUSCULOSKELETAL: No joint deformity, no clubbing, no edema.  NEUROLOGIC: No overt focal deficit noted. Speech is fluent. SKIN: Intact,warm,dry.  Port wine stain on left hand. PSYCH: Mood and behavior are normal.    Assessment & Plan:     ICD-10-CM   1. Stage 4 very severe COPD by GOLD classification ([place])  J44.9 AMB REFERRAL FOR DME   Continue Breztri 2 puffs twice a day Continue albuterol as needed Nebulizer for albuterol as needed Follow-up 3 months  2. Nocturnal hypoxemia due to emphysema (HCC)  J43.9    G47.36    Continue oxygen at 2 L/min Patient has been compliant Patient reports improvement on therapy  3. Tobacco dependence due to cigarettes  F17.210    Counseled regards discontinuation of smoking Total counseling time 3 to 5 minutes   Orders Placed This Encounter  Procedures  . AMB REFERRAL FOR DME  Referral Priority:   Routine    Referral Type:   Durable Medical Equipment Purchase    Number of Visits Requested:   1   Meds ordered this encounter  Medications  . Budeson-Glycopyrrol-Formoterol (BREZTRI AEROSPHERE) 160-9-4.8 MCG/ACT AERO    Sig: Inhale 2 puffs into the lungs in the morning and at bedtime.    Dispense:  5.9 g    Refill:  0    Order Specific Question:   Lot Number?    Answer:   [id number]    Order Specific Question:   Expiration Date?     Answer:   05/04/2022    Order Specific Question:   Manufacturer?    Answer:   [employer] [[id number]]    Order Specific Question:   Quantity    Answer:   2  . albuterol (PROVENTIL) (2.5 MG/3ML) 0.083% nebulizer solution    Sig: Take 3 mLs (2.5 mg total) by nebulization every 4 (four) hours as needed for wheezing or shortness of breath.    Dispense:  360 mL    Refill:  2   Discussion:  Patient doing better after his recent exacerbation.  He is on Breztri twice a day.  We have provided him butyryl both as HFA and nebulizer to use as needed.  We will see the patient in follow-up in 3 months time call sooner should any new problems arise.  Renold Don, MD [facility]   *This note was dictated using voice recognition software/Dragon.  Despite best efforts to proofread, errors can occur which can change the meaning.  Any change was purely unintentional.

## 2020-11-18 ENCOUNTER — Telehealth: Payer: Self-pay | Admitting: Pulmonary Disease

## 2020-11-18 DIAGNOSIS — J449 Chronic obstructive pulmonary disease, unspecified: Secondary | ICD-10-CM | POA: Diagnosis not present

## 2020-11-18 NOTE — Telephone Encounter (Signed)
I called [employer] and finally got to speak with someone. She stated that the patient should have not gotten a bill for the ONO.  She wasn't sure why they didn't bill his insurance.  She also stated that if he got a bill in January 2022 to call [employer] [phone] and give them Refer reading # [id number]. I have spoke with Parker Martinez and he is aware of this information

## 2020-11-24 ENCOUNTER — Other Ambulatory Visit: Payer: Self-pay | Admitting: Internal Medicine

## 2020-11-24 ENCOUNTER — Other Ambulatory Visit: Payer: Self-pay | Admitting: Family Medicine

## 2020-11-24 DIAGNOSIS — H353132 Nonexudative age-related macular degeneration, bilateral, intermediate dry stage: Secondary | ICD-10-CM | POA: Diagnosis not present

## 2020-12-04 DIAGNOSIS — R0600 Dyspnea, unspecified: Secondary | ICD-10-CM | POA: Diagnosis not present

## 2020-12-18 DIAGNOSIS — R0600 Dyspnea, unspecified: Secondary | ICD-10-CM | POA: Diagnosis not present

## 2020-12-26 ENCOUNTER — Other Ambulatory Visit: Payer: Self-pay | Admitting: Family Medicine

## 2021-01-04 ENCOUNTER — Other Ambulatory Visit: Payer: Self-pay | Admitting: Pulmonary Disease

## 2021-01-08 ENCOUNTER — Other Ambulatory Visit: Payer: Self-pay | Admitting: Pulmonary Disease

## 2021-01-12 ENCOUNTER — Telehealth: Payer: Self-pay | Admitting: Pulmonary Disease

## 2021-01-12 MED ORDER — PREDNISONE 10 MG PO TABS: 10.0000 mg | ORAL_TABLET | Freq: Every day | ORAL | 1 refills | Status: DC

## 2021-01-12 NOTE — Telephone Encounter (Signed)
Called and spoke with pt and he stated that the pharmacy advised him that they had sent over refill requests for the prednisone to be refilled but never heard back from our office.  Refill sent in and nothing further is needed.

## 2021-01-17 IMAGING — DX DG LUMBAR SPINE COMPLETE 4+V
5 series · 5 of 5 positions shown · non-contrast
Comparison: Radiograph 01/24/2019 MRI 02/05/2019

CLINICAL DATA: Right foot drop. Known lumbar radiculopathy.

EXAM:
LUMBAR SPINE - COMPLETE 4+ VIEW

[l-spine ap]
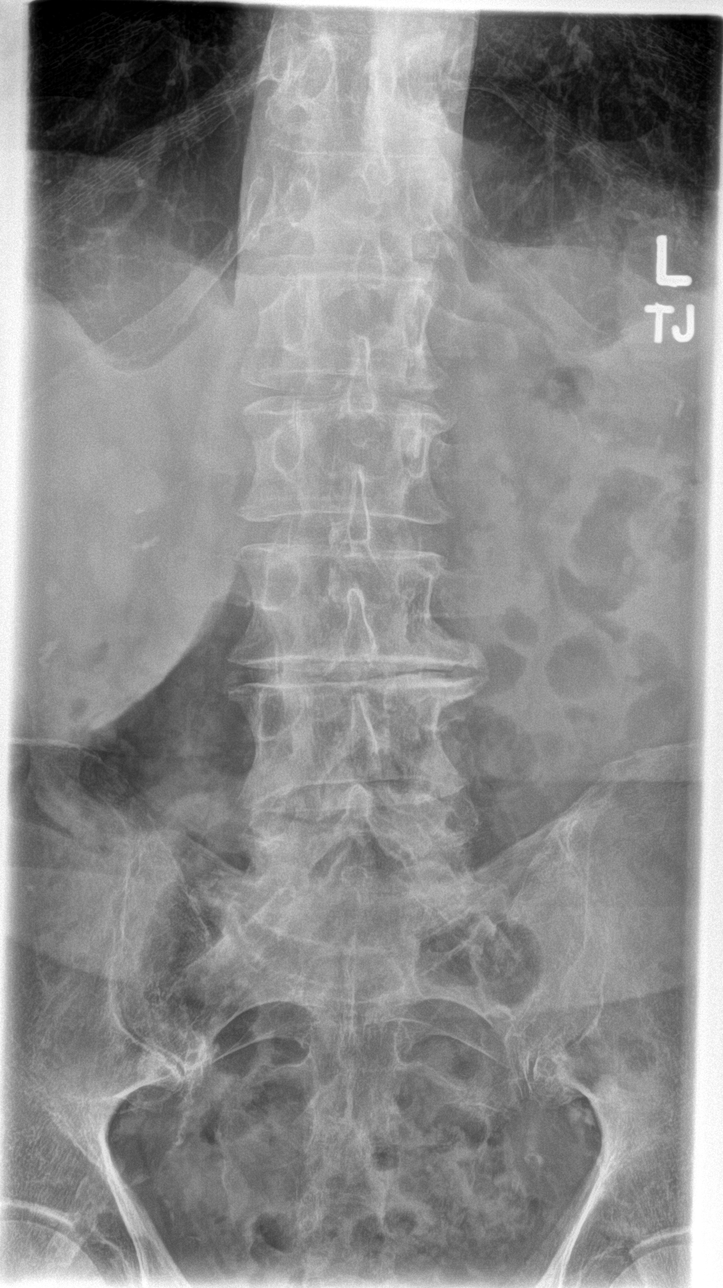

[l-spine obl (1 of 2)]
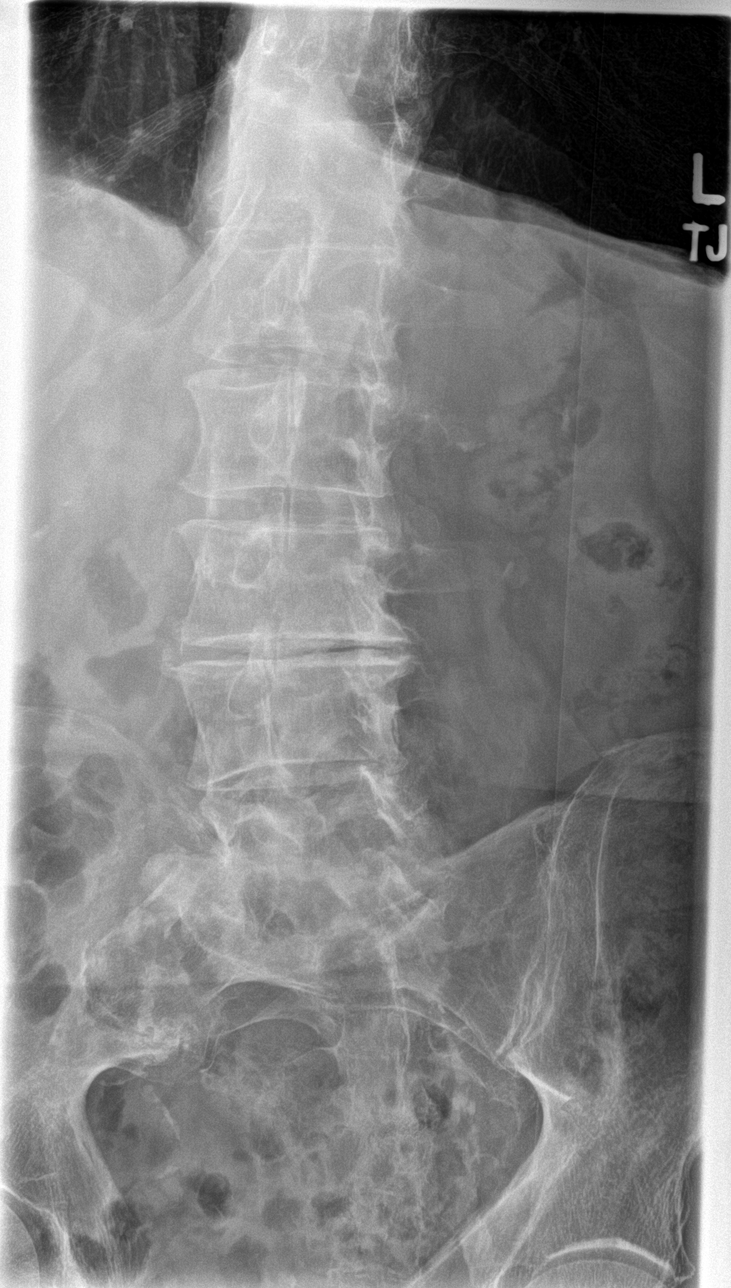

[l-spine obl (2 of 2)]
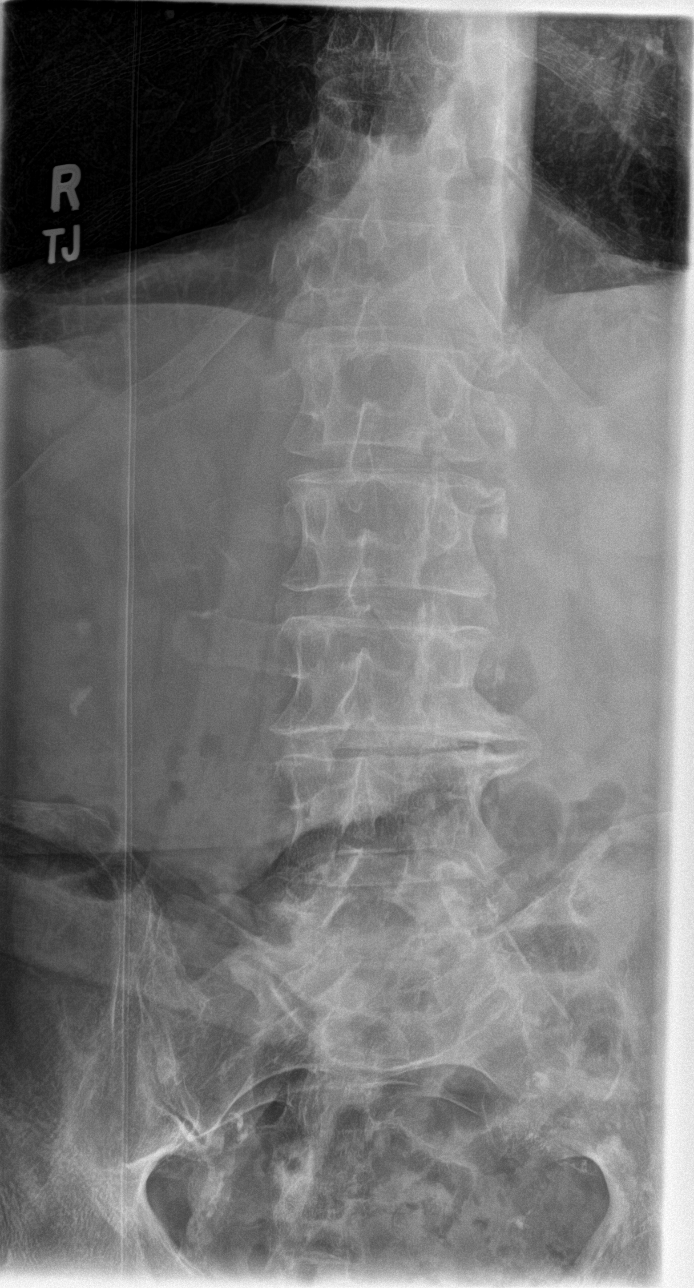

[l-spine lat]
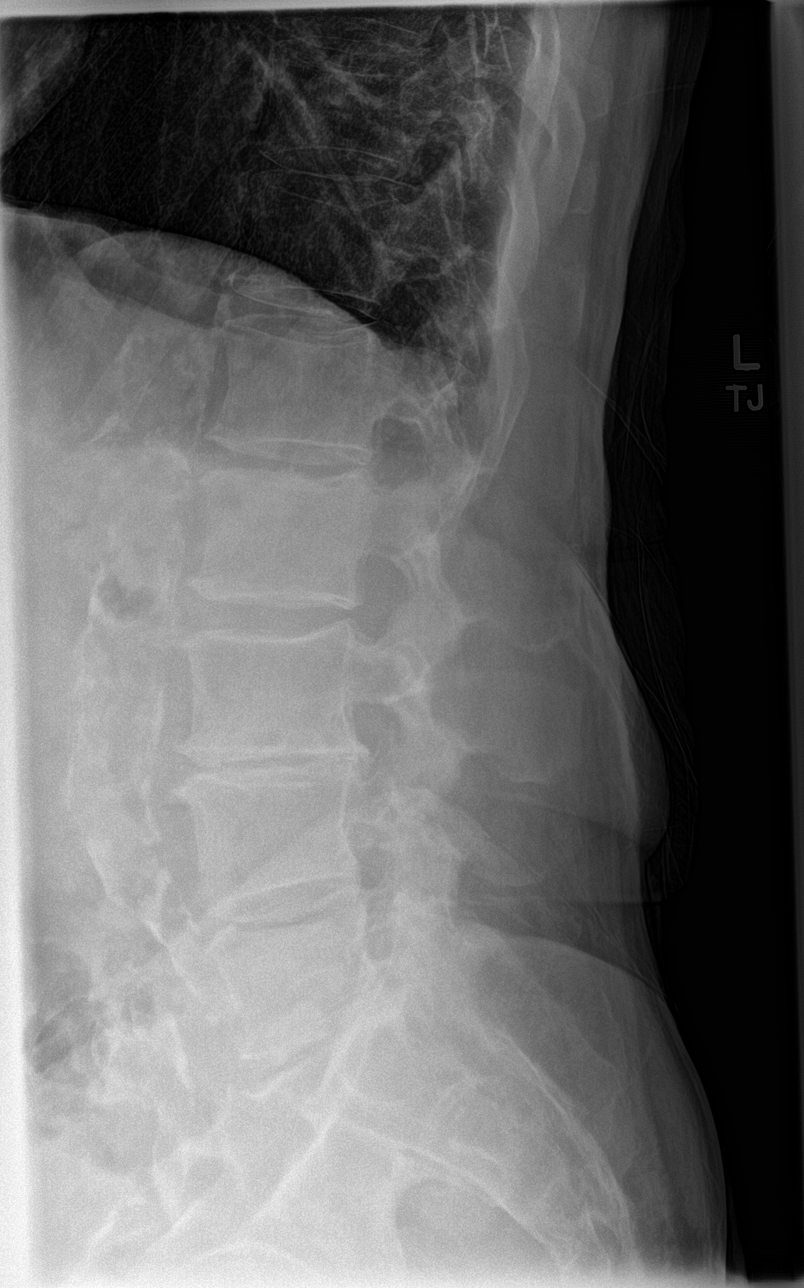

[l-spine l5/s1]
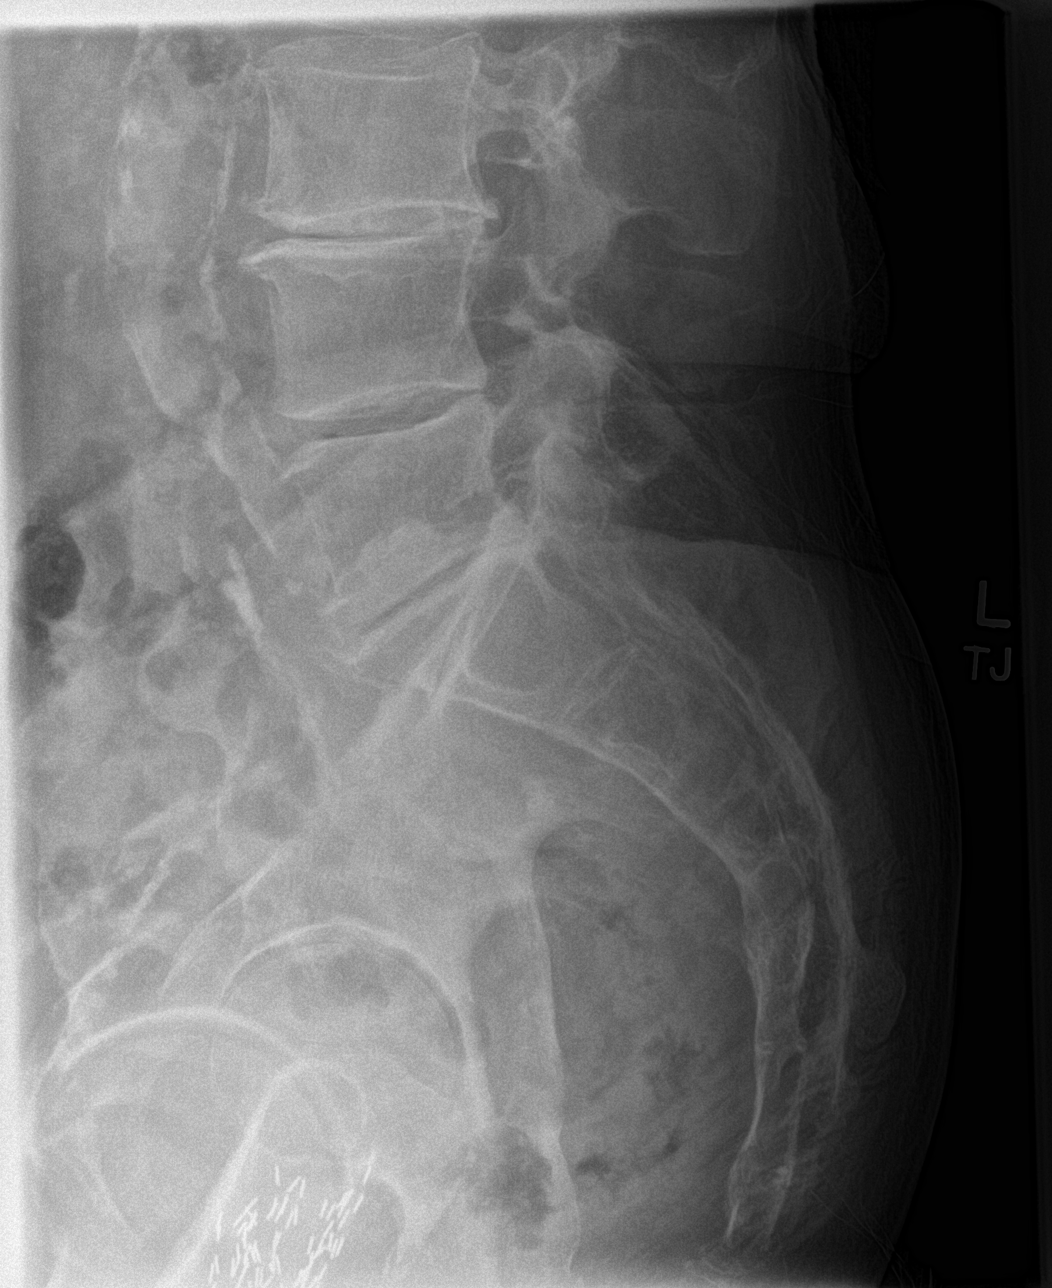

[5 of 5 positions shown; findings below may reference images not displayed]

FINDINGS: Slight broad-based scoliotic curvature. Straightening of normal
lordosis. Vertebral body heights are preserved. No acute or
compression fracture. Disc space narrowing with endplate spurring at
L1-L2, L3-L4, L4-L5 and L5-S1, most prominent at L3-L4. Mild
progressive disc height loss at L1-L2, otherwise no significant
interval change. There is L3-L4 and L4-L5 facet hypertrophy. No
evidence of focal bone lesion. The sacroiliac joints are congruent.
Brachytherapy seeds in the prostate bed.
IMPRESSION: 1. Multilevel degenerative disc disease and facet arthropathy, with
slight progressive disc height loss at L1-L2 from 1515 comparison.
2. Slight broad-based scoliotic curvature.

## 2021-01-18 DIAGNOSIS — R0600 Dyspnea, unspecified: Secondary | ICD-10-CM | POA: Diagnosis not present

## 2021-01-27 ENCOUNTER — Encounter: Payer: Self-pay | Admitting: Pulmonary Disease

## 2021-01-27 ENCOUNTER — Ambulatory Visit (INDEPENDENT_AMBULATORY_CARE_PROVIDER_SITE_OTHER): Payer: PPO | Admitting: Pulmonary Disease

## 2021-01-27 ENCOUNTER — Other Ambulatory Visit: Payer: Self-pay

## 2021-01-27 VITALS — BP 100/70 | HR 124 | Temp 97.3°F | Ht 69.0 in | Wt 145.0 lb

## 2021-01-27 DIAGNOSIS — J441 Chronic obstructive pulmonary disease with (acute) exacerbation: Secondary | ICD-10-CM

## 2021-01-27 DIAGNOSIS — F17210 Nicotine dependence, cigarettes, uncomplicated: Secondary | ICD-10-CM | POA: Diagnosis not present

## 2021-01-27 DIAGNOSIS — J439 Emphysema, unspecified: Secondary | ICD-10-CM | POA: Diagnosis not present

## 2021-01-27 DIAGNOSIS — J449 Chronic obstructive pulmonary disease, unspecified: Secondary | ICD-10-CM | POA: Diagnosis not present

## 2021-01-27 DIAGNOSIS — R000 Tachycardia, unspecified: Secondary | ICD-10-CM

## 2021-01-27 DIAGNOSIS — G4736 Sleep related hypoventilation in conditions classified elsewhere: Secondary | ICD-10-CM

## 2021-01-27 MED ORDER — CLARITHROMYCIN 500 MG PO TABS: 500.0000 mg | ORAL_TABLET | Freq: Two times a day (BID) | ORAL | 0 refills | Status: AC

## 2021-01-27 MED ORDER — AZELASTINE HCL 0.1 % NA SOLN: 1.0000 | Freq: Two times a day (BID) | NASAL | 2 refills | Status: DC

## 2021-01-27 MED ORDER — PREDNISONE 10 MG (21) PO TBPK: ORAL_TABLET | ORAL | 0 refills | Status: DC

## 2021-01-27 NOTE — Progress Notes (Signed)
Subjective:    Patient ID: Parker Martinez, male    DOB: 04/18/1941, 80 y.o.   MRN: [id number]  Requesting MD/Service:Javier [name], MD Date of initial consultation:06/03/19by Dr. Merton Border Reason for consultation:Severe COPD, smoker  PT PROFILE: 80y.o.maleformersmoker (previously > 1 PPD,with diagnosis of COPD rendered several years ago and with progressive DOE over past 5-7 years  DATA: 10/19/10 Spirometry:Severe obstruction (FEV1 41% predicted) 05/28/18 PFTs: FVC: 2.13 >2.96 L (58 >80 %pred), FEV1: 0.85 >1.04 L (30 >36 %pred), FEV1/FVC: 40 >35%, TLC: 7.90 L (128 %pred), DLCO 50 %pred. Flow volume curve consistent with severe obstruction 02/20/19 PFTs:FEV1 was 1.96 L or 48% predicted, FVC was 2.72 L or 95% predicted, FEV1/FVC was 38%. TLC  131%, RV 165%, indicating hyperinflation and air trapping. Diffusion capacity was 48%. Findings consistent with severe obstruction and emphysema. 05/05/20 Overnight oximetry:Shows nocturnal desaturations and possible obstructive sleep apnea. Patient will need sleep study 06/29/212D echo:LVEF 60 to 65%, no evidence of cor pulmonale, mild aortic sclerosis,diastolic dysfunction type I.  INTERVAL: Last visit12/15/2021withme. Admitted 11/23 through 10/28/2020 for COPD exacerbation.  Unfortunately continues to smoke.No new issues since admission.  HPI Parker Martinez presents today for follow-up. He is an 80 year old current smoker (half PPD) who has issues with very severe COPD and chronic nocturnal hypoxemia due to COPD with emphysema. Patient is compliant with oxygen nocturnally. He has not been able to be tested for desaturations during exercise due to inability to walk from multiple back issues. His mobility is extremely limited. He presents today complaining of ongoing issues with dyspnea. Cough has become very productive of yellowish sputum. No hemoptysis. No fevers, chills or sweats. He has tachycardia at baseline  slightly faster today but not much above baseline.  He does not endorse any other symptomatology  Review of Systems A 10 point review of systems was performed and it is as noted above otherwise negative.  Patient Active Problem List   Diagnosis Date Noted  . Protein-calorie malnutrition ([place]) 11/04/2020  . Hypokalemia 11/04/2020  . Shortness of breath 10/26/2020  . Sepsis ([place]) 10/26/2020  . Tachycardia 10/20/2020  . Right foot drop 09/21/2020  . Swelling of right foot 09/07/2020  . Aorto-iliac atherosclerosis ([place]) 01/25/2019  . Left lumbar radiculopathy 01/24/2019  . Bilateral hearing loss 10/21/2018  . BPV (benign positional vertigo), right 09/09/2018  . Heart murmur 09/09/2018  . Encounter for chronic pain management 05/15/2017  . Chronic pain in left shoulder 01/30/2017  . Subclavian steal syndrome 10/12/2016  . Abnormal drug screen   . Atherosclerosis of native arteries of extremity with intermittent claudication ([place]) 10/21/2014  . Medicare annual wellness visit, subsequent 09/11/2014  . Carotid stenosis 09/11/2014  . Advanced care planning/counseling discussion 09/11/2014  . Vitamin D deficiency 09/01/2014  . DDD (degenerative disc disease), lumbar   . Personal history of colonic adenomas 09/02/2013  . COPD exacerbation ([place]) 03/04/2013  . Healthcare maintenance 04/25/2012  . Polycythemia secondary to smoking 04/25/2012  . Dyslipidemia 04/25/2012  . HTN (hypertension)   . PVD (peripheral vascular disease) with claudication ([place])   . COPD, severe ([place])   . Prostate cancer ([place])   . GERD (gastroesophageal reflux disease)   . Idiopathic anaphylaxis   . Smoker 09/13/2011  . Esophageal rings - lower esophagus 06/04/2009   Social History   Tobacco Use  . Smoking status: Current Every Day Smoker    Packs/day: 1.00    Years: 60.00    Pack years: 60.00    Types: Cigarettes  . Smokeless tobacco: Never  Used  . Tobacco comment: 2 or 3 to a 1/2 a pack  3 or 4 days    Substance Use Topics  . Alcohol use: Yes    Alcohol/week: 14.0 standard drinks    Types: 14 Cans of beer per week    Comment: socially   Allergies  Allergen Reactions  . Flonase [Fluticasone Propionate] Other (See Comments)    Per pt, causes rebound effect    Current Meds  Medication Sig  . acetaminophen (TYLENOL) 325 MG tablet Take 1 tablet (325 mg total) by mouth every 6 (six) hours as needed for mild pain, fever or headache (or Fever >/= 101).  Marland Kitchen albuterol (PROVENTIL) (2.5 MG/3ML) 0.083% nebulizer solution Take 3 mLs (2.5 mg total) by nebulization every 4 (four) hours as needed for wheezing or shortness of breath.  Marland Kitchen albuterol (VENTOLIN HFA) 108 (90 Base) MCG/ACT inhaler USE 2 PUFFS EVERY 6 HOURS AS NEEDED FOR WHEEZING  . alfuzosin (UROXATRAL) 10 MG 24 hr tablet Take 10 mg by mouth daily with breakfast.  . aspirin 81 MG tablet Take 81 mg by mouth every morning.  Marland Kitchen atorvastatin (LIPITOR) 40 MG tablet TAKE 1 TABLET BY MOUTH EVERY DAY  . azelastine (ASTELIN) 0.1 % nasal spray Place 1 spray into both nostrils 2 (two) times daily.  Marland Kitchen BREZTRI AEROSPHERE 160-9-4.8 MCG/ACT AERO INHALE 2 PUFFS BY MOUTH TWICE A DAY  . clarithromycin (BIAXIN) 500 MG tablet Take 1 tablet (500 mg total) by mouth 2 (two) times daily for 7 days.  Marland Kitchen EPINEPHrine (EPIPEN 2-PAK) 0.3 mg/0.3 mL IJ SOAJ injection Inject 0.3 mLs (0.3 mg total) into the muscle as needed for anaphylaxis.  Marland Kitchen EPINEPHrine 0.3 mg/0.3 mL IJ SOAJ injection Inject 0.3 mg into the muscle as needed for anaphylaxis.   . fexofenadine (ALLEGRA) 180 MG tablet Take 180 mg by mouth daily.  Marland Kitchen ipratropium (ATROVENT) 0.06 % nasal spray PLACE 2 SPRAYS INTO BOTH NOSTRILS 4 (FOUR) TIMES DAILY AS NEEDED FOR RHINITIS.  Marland Kitchen montelukast (SINGULAIR) 10 MG tablet TAKE 1 TABLET BY MOUTH EVERYDAY AT BEDTIME  . Multiple Vitamin (MULTIVITAMIN) tablet Take 1 tablet by mouth daily.  . pantoprazole (PROTONIX) 40 MG tablet TAKE 1 TABLET BY MOUTH 2 TIMES DAILY BEFORE A MEAL. [place] AND SUPPER  . predniSONE (DELTASONE) 10 MG tablet Take 1 tablet (10 mg total) by mouth daily with breakfast.  . predniSONE (STERAPRED UNI-PAK 21 TAB) 10 MG (21) TBPK tablet Take as directed in the package  . vitamin B-12 (CYANOCOBALAMIN) 500 MCG tablet Take 500 mcg by mouth every morning.   . [DISCONTINUED] Budeson-Glycopyrrol-Formoterol (BREZTRI AEROSPHERE) 160-9-4.8 MCG/ACT AERO Inhale 2 puffs into the lungs in the morning and at bedtime.  . [DISCONTINUED] sulfamethoxazole-trimethoprim (BACTRIM) 400-80 MG tablet Take 1 tablet by mouth 2 (two) times daily.   Immunization History  Administered Date(s) Administered  . Fluad Quad(high Dose 65+) 09/09/2019, 09/07/2020  . Influenza,inj,Quad PF,6+ Mos 09/11/2014, 09/24/2015, 09/20/2016, 09/14/2017, 08/29/2018  . PFIZER(Purple Top)SARS-COV-2 Vaccination 12/20/2019, 01/10/2020, 09/05/2020  . Pneumococcal Conjugate-13 09/11/2014  . Pneumococcal Polysaccharide-23 12/04/2009       Objective:   Physical Exam BP 100/70 (BP Location: Left Arm, Cuff Size: Normal)   Pulse (!) 124   Temp (!) 97.3 F (36.3 C) (Temporal)   Ht 5\' 9"  (1.753 m)   Wt 145 lb (65.8 kg)   SpO2 95%   BMI 21.41 kg/m  GENERAL: Disheveled appearing elderly male, no acute distress. Use of accessories, chronic. Presents in transport chair (due  to dyspnea). HEAD: Normocephalic, atraumatic.  EYES: Pupils equal, round, reactive to light. No scleral icterus.  MOUTH: Nose/mouth/throat not examined due to masking requirements for COVID 19. NECK: Supple. No thyromegaly. Trachea midline. No JVD. No adenopathy. PULMONARY:Distant breath sounds,coarse breath sounds with wheezes throughout.  CARDIOVASCULAR: S1 and S2. Tachycardic rate with regular rhythm.Grade 1/6 to 2/6 systolic ejection murmur left sternal border. GASTROINTESTINAL: Benign. MUSCULOSKELETAL: No joint deformity, no clubbing, no edema.  NEUROLOGIC: No overt focal deficit noted. Speech is  fluent. SKIN: No rashes, port wine nevus on middle finger left hand. PSYCH: Mood and behavior are normal     Assessment & Plan:     ICD-10-CM   1. Stage 4 very severe COPD by GOLD classification ([place])  J44.9    Patient is end-stage He is to continue his nebulization treatments Not helped by MDIs due to lack of breath-holding capacity  2. COPD exacerbation ([place])  J44.1    Appears to have a COPD exacerbation Suspect due to acute bronchitis Biaxin 500 mg twice a day x7 days Prednisone taper  3. Nocturnal hypoxemia due to emphysema ([facility])  J43.9    G47.36    Continue oxygen supplementation Has been compliant  4. Tobacco dependence due to cigarettes  F17.210    Unfortunately continues to smoke Counseled regards to discontinuation of smoking  5. Tachycardia  R00.0    Likely due to acute illness Encouraged to increase p.o. fluids Sinus tach by exam     Meds ordered this encounter  Medications  . azelastine (ASTELIN) 0.1 % nasal spray    Sig: Place 1 spray into both nostrils 2 (two) times daily.    Dispense:  30 mL    Refill:  2  . predniSONE (STERAPRED UNI-PAK 21 TAB) 10 MG (21) TBPK tablet    Sig: Take as directed in the package    Dispense:  21 tablet    Refill:  0  . clarithromycin (BIAXIN) 500 MG tablet    Sig: Take 1 tablet (500 mg total) by mouth 2 (two) times daily for 7 days.    Dispense:  14 tablet    Refill:  0    Hold atorvastatin while on Biaxin.   Patient appears to be having an exacerbation of COPD likely due to bronchitis.  He is quite tachycardic today likely due to poor p.o. intake.  Does not appear to be in A. fib.  Patient does not wish to be evaluated in the ED.  We will treat him with antibiotics and prednisone taper.  Encouraged him to go to the ED if his symptoms fail to improve or worsen.  We will see him in follow-up in 3 to 4 weeks time he is to contact us prior to that time should any new difficulties arise.  He will see me or our nurse practitioner  at that time.   Renold Don, MD [facility]   *This note was dictated using voice recognition software/Dragon.  Despite best efforts to proofread, errors can occur which can change the meaning.  Any change was purely unintentional.

## 2021-01-27 NOTE — Patient Instructions (Signed)
We have sent prescriptions to your pharmacy  You will be placed on a prednisone taper pack.  Hold your regular prednisone while on the taper pack.  Resume your regular prednisone once the taper pack is completed.  You will be on an antibiotic (clarithromycin) twice a day for 7 days.  While you are on this antibiotic please do not take your Lipitor (atorvastatin), you may resume once the antibiotic has been completed.  I have switch your nasal spray to azelastine, this will be 1 spray to each nostril twice a day.  We will see you in follow-up in 3 to 4 weeks time with either me or one of the nurse practitioners.  Call sooner should any new problems arise.  As always, if your symptoms worsen or fail to improve you are encouraged to come to the emergency room.

## 2021-02-15 DIAGNOSIS — R0600 Dyspnea, unspecified: Secondary | ICD-10-CM | POA: Diagnosis not present

## 2021-02-20 ENCOUNTER — Other Ambulatory Visit: Payer: Self-pay | Admitting: Internal Medicine

## 2021-02-22 ENCOUNTER — Other Ambulatory Visit: Payer: Self-pay | Admitting: Family Medicine

## 2021-02-23 NOTE — Telephone Encounter (Signed)
Pharmacy requests refill on: Albuterol 108 mcg/act Inhaler   LAST REFILL: 08/19/2020 (Q-18 g, R-3) LAST OV: 11/03/2020 NEXT OV: Not Scheduled  PHARMACY: [facility] [place], [place]

## 2021-02-25 ENCOUNTER — Ambulatory Visit (INDEPENDENT_AMBULATORY_CARE_PROVIDER_SITE_OTHER): Payer: PPO | Admitting: Adult Health

## 2021-02-25 ENCOUNTER — Other Ambulatory Visit: Payer: Self-pay

## 2021-02-25 ENCOUNTER — Encounter: Payer: Self-pay | Admitting: Adult Health

## 2021-02-25 VITALS — BP 142/68 | HR 106 | Temp 97.5°F | Ht 69.0 in | Wt 147.0 lb

## 2021-02-25 DIAGNOSIS — J9611 Chronic respiratory failure with hypoxia: Secondary | ICD-10-CM | POA: Diagnosis not present

## 2021-02-25 DIAGNOSIS — J449 Chronic obstructive pulmonary disease, unspecified: Secondary | ICD-10-CM

## 2021-02-25 DIAGNOSIS — E440 Moderate protein-calorie malnutrition: Secondary | ICD-10-CM | POA: Diagnosis not present

## 2021-02-25 DIAGNOSIS — J441 Chronic obstructive pulmonary disease with (acute) exacerbation: Secondary | ICD-10-CM | POA: Diagnosis not present

## 2021-02-25 DIAGNOSIS — Z7189 Other specified counseling: Secondary | ICD-10-CM | POA: Diagnosis not present

## 2021-02-25 MED ORDER — BREZTRI AEROSPHERE 160-9-4.8 MCG/ACT IN AERO: 2.0000 | INHALATION_SPRAY | Freq: Two times a day (BID) | RESPIRATORY_TRACT | 3 refills | Status: DC

## 2021-02-25 NOTE — Assessment & Plan Note (Signed)
Continue on nocturnal oxygen at 2 L 

## 2021-02-25 NOTE — Assessment & Plan Note (Signed)
High-protein diet 

## 2021-02-25 NOTE — Assessment & Plan Note (Signed)
Recent exacerbation now improved and back to baseline.  At baseline patient has significant symptom burden.  Despite maximum therapy. Referral to palliative care.  Continue on oxygen therapy.   Plan  Patient Instructions  Continue on BREZTRI 2 puffs Twice daily  , rinse after use .  Mucinex DM Twice daily  As needed  Cough/congestion  Albuterol inhaler /neb As needed   Palliative care referral .  Continue on Oxygen At bedtime  .  Work on not smoking .  Follow up with Dr [name] in 3 months and As needed   Please contact office for sooner follow up if symptoms do not improve or worsen or seek emergency care

## 2021-02-25 NOTE — Assessment & Plan Note (Signed)
Palliative care referral.

## 2021-02-25 NOTE — Patient Instructions (Addendum)
Continue on BREZTRI 2 puffs Twice daily  , rinse after use .  Mucinex DM Twice daily  As needed  Cough/congestion  Albuterol inhaler /neb As needed   Palliative care referral .  Continue on Oxygen At bedtime  .  Work on not smoking .  Follow up with Dr [name] in 3 months and As needed   Please contact office for sooner follow up if symptoms do not improve or worsen or seek emergency care

## 2021-02-25 NOTE — Progress Notes (Signed)
@Patient  ID: Parker Martinez, male    DOB: 1941-01-01, 80 y.o.   MRN: [id number]  Chief Complaint  Patient presents with  . Follow-up    Referring provider: Ria Bush, MD  HPI: 80 year old male  smoker followed for severe COPD and oxygen dependent respiratory failure on nocturnal oxygen  TEST/EVENTS :  10/19/10 Spirometry:Severe obstruction (FEV1 41% predicted) 05/28/18 PFTs: FVC: 2.13 >2.96 L (58 >80 %pred), FEV1: 0.85 >1.04 L (30 >36 %pred), FEV1/FVC: 40 >35%, TLC: 7.90 L (128 %pred), DLCO 50 %pred. Flow volume curve consistent with severe obstruction 02/20/19 PFTs:FEV1 was 1.96 L or 48% predicted, FVC was 2.72 L or 95% predicted, FEV1/FVC was 38%. TLC  131%, RV 165%, indicating hyperinflation and air trapping. Diffusion capacity was 48%. Findings consistent with severe obstruction and emphysema. 05/05/20 Overnight oximetry:Shows nocturnal desaturations and possible obstructive sleep apnea. Patient will need sleep study 06/29/212D echo:LVEF 60 to 65%, no evidence of cor pulmonale, mild aortic sclerosis,diastolic dysfunction type I.  02/25/2021 Follow up : COPD and O2 RF  Patient returns for a 1 month follow-up.  Last visit patient had a COPD exacerbation.  He was treated with clarithromycin and prednisone taper.  Patient says he is feeling better.  He continues to be short of breath.  Has a daily cough.  He denies any hemoptysis or chest pain.  Appetite is fair.  Patient is on nocturnal oxygen at 2 L. Patient says he has a low activity tolerance.  Gets short of breath with minimum activities.  Last visit palliative care discussion was had.  Patient says he is ready to have a referral to them.  Says he needs some more assistance at home.  And is having more difficulty over this last year.  Patient remains on triple therapy inhaler twice daily.  Uses his albuterol nebulizer most days.  Patient does smoke.  Smoking cessation was discussed.   Allergies  Allergen  Reactions  . Flonase [Fluticasone Propionate] Other (See Comments)    Per pt, causes rebound effect     Immunization History  Administered Date(s) Administered  . Fluad Quad(high Dose 65+) 09/09/2019, 09/07/2020  . Influenza,inj,Quad PF,6+ Mos 09/11/2014, 09/24/2015, 09/20/2016, 09/14/2017, 08/29/2018  . PFIZER(Purple Top)SARS-COV-2 Vaccination 12/20/2019, 01/10/2020, 09/05/2020  . Pneumococcal Conjugate-13 09/11/2014  . Pneumococcal Polysaccharide-23 12/04/2009    Past Medical History:  Diagnosis Date  . Abnormal drug screen 10/2013, 03/2015   low Cr, +EtOH, neg hydrocodone (10/2013, again 03/2015)  . Allergic rhinitis   . Arthritis    lumbar and cervical spine  . Carotid stenosis 09/11/2014   40-59% bilat ICA stenosis, rpt 1 yr (09/2014)   . COPD (chronic obstructive pulmonary disease) (HCC)    severe, participated in [place] study  . DDD (degenerative disc disease)    lumbar (HNP R L3/4) s/p ESI, cervical  . Diverticulitis 2015   by CT  . Diverticulosis 2008   severe by colonoscopy and CT  . Duodenitis   . Esophageal stricture 2012   s/p dilation  . GERD (gastroesophageal reflux disease)   . Hemangioma    congenital left arm  . History of kidney stones remote  . HTN (hypertension)    borderline readings at home  . Idiopathic anaphylaxis    h/o recurrent anaphylaxis, unknown trigger, s/p eval at [place] [phone]), last episode 03/2011  . PAD (peripheral artery disease) ([place])   . Personal history of colonic adenomas 09/02/2013  . Prostate cancer [facility]) 2011   prostate seed implant (Dr. [name])  .  Smoker   . Status post dilatation of esophageal stricture   . Subclavian steal syndrome 10/12/2016   Left subclavian steal, with 18 mmHg brachial artery pressure gradient (10/2016). If symptomatic, rec PV consult    Tobacco History: Social History   Tobacco Use  Smoking Status Current Every Day Smoker  . Packs/day: 1.00  . Years: 60.00  . Pack years: 60.00   . Types: Cigarettes  Smokeless Tobacco Never Used  Tobacco Comment   2 or 3 to a 1/2 a pack  3 or 4 days    Ready to quit: No Counseling given: Yes Comment: 2 or 3 to a 1/2 a pack  3 or 4 days    Outpatient Medications Prior to Visit  Medication Sig Dispense Refill  . acetaminophen (TYLENOL) 325 MG tablet Take 1 tablet (325 mg total) by mouth every 6 (six) hours as needed for mild pain, fever or headache (or Fever >/= 101).    Marland Kitchen albuterol (PROVENTIL) (2.5 MG/3ML) 0.083% nebulizer solution Take 3 mLs (2.5 mg total) by nebulization every 4 (four) hours as needed for wheezing or shortness of breath. 360 mL 2  . albuterol (VENTOLIN HFA) 108 (90 Base) MCG/ACT inhaler USE 2 PUFFS EVERY 6 HOURS AS NEEDED FOR WHEEZING 18 each 1  . alfuzosin (UROXATRAL) 10 MG 24 hr tablet Take 10 mg by mouth daily with breakfast.    . aspirin 81 MG tablet Take 81 mg by mouth every morning.    Marland Kitchen atorvastatin (LIPITOR) 40 MG tablet TAKE 1 TABLET BY MOUTH EVERY DAY 90 tablet 3  . azelastine (ASTELIN) 0.1 % nasal spray Place 1 spray into both nostrils 2 (two) times daily. 30 mL 2  . BREZTRI AEROSPHERE 160-9-4.8 MCG/ACT AERO INHALE 2 PUFFS BY MOUTH TWICE A DAY 10.7 g 11  . EPINEPHrine (EPIPEN 2-PAK) 0.3 mg/0.3 mL IJ SOAJ injection Inject 0.3 mLs (0.3 mg total) into the muscle as needed for anaphylaxis. 1 each 1  . fexofenadine (ALLEGRA) 180 MG tablet Take 180 mg by mouth daily.    Marland Kitchen ipratropium (ATROVENT) 0.06 % nasal spray PLACE 2 SPRAYS INTO BOTH NOSTRILS 4 (FOUR) TIMES DAILY AS NEEDED FOR RHINITIS. 15 mL 3  . montelukast (SINGULAIR) 10 MG tablet TAKE 1 TABLET BY MOUTH EVERYDAY AT BEDTIME 90 tablet 3  . Multiple Vitamin (MULTIVITAMIN) tablet Take 1 tablet by mouth daily.    . pantoprazole (PROTONIX) 40 MG tablet TAKE 1 TABLET BY MOUTH 2 TIMES DAILY BEFORE A MEAL. 30 MINS BEFORE BREAKFAST AND SUPPER 180 tablet 0  . predniSONE (DELTASONE) 10 MG tablet Take 1 tablet (10 mg total) by mouth daily with breakfast. 30 tablet  1  . vitamin B-12 (CYANOCOBALAMIN) 500 MCG tablet Take 500 mcg by mouth every morning.     Marland Kitchen EPINEPHrine 0.3 mg/0.3 mL IJ SOAJ injection Inject 0.3 mg into the muscle as needed for anaphylaxis.  (Patient not taking: Reported on 02/25/2021)    . predniSONE (STERAPRED UNI-PAK 21 TAB) 10 MG (21) TBPK tablet Take as directed in the package (Patient not taking: Reported on 02/25/2021) 21 tablet 0   No facility-administered medications prior to visit.     Review of Systems:   Constitutional:   No  weight loss, night sweats,  Fevers, chills,  +fatigue, or  lassitude.  HEENT:   No headaches,  Difficulty swallowing,  Tooth/dental problems, or  Sore throat,                No sneezing, itching, ear ache,  nasal congestion, post nasal drip,   CV:  No chest pain,  Orthopnea, PND, swelling in lower extremities, anasarca, dizziness, palpitations, syncope.   GI  No heartburn, indigestion, abdominal pain, nausea, vomiting, diarrhea, change in bowel habits, loss of appetite, bloody stools.   Resp:    No chest wall deformity  Skin: no rash or lesions.  GU: no dysuria, change in color of urine, no urgency or frequency.  No flank pain, no hematuria   MS:  No joint pain or swelling.  No decreased range of motion.  No back pain.    Physical Exam  BP (!) 142/68 (BP Location: Left Arm, Patient Position: Sitting, Cuff Size: Normal)   Pulse (!) 106   Temp (!) 97.5 F (36.4 C) (Temporal)   Ht 5\' 9"  (1.753 m)   Wt 147 lb (66.7 kg)   SpO2 95%   BMI 21.71 kg/m   GEN: A/Ox3; pleasant , NAD, chronically ill-appearing, on oxygen   HEENT:  [place]/AT,    NOSE-clear, THROAT-clear, no lesions, no postnasal drip or exudate noted.   NECK:  Supple w/ fair ROM; no JVD; normal carotid impulses w/o bruits; no thyromegaly or nodules palpated; no lymphadenopathy.    RESP decreased breath sounds in the bases   no accessory muscle use, no dullness to percussion  CARD:  RRR, no m/r/g, no peripheral edema, pulses intact,  no cyanosis or clubbing.  GI:   Soft & nt; nml bowel sounds; no organomegaly or masses detected.   Musco: Warm bil, no deformities or joint swelling noted.   Neuro: alert, no focal deficits noted.    Skin: Warm, no lesions or rashes    Lab Results:  CBC   BNP No results found for: BNP  ProBNP No results found for: PROBNP  Imaging: No results found.    No flowsheet data found.  No results found for: NITRICOXIDE      Assessment & Plan:   COPD exacerbation ([place]) Recent exacerbation now improved and back to baseline.  At baseline patient has significant symptom burden.  Despite maximum therapy. Referral to palliative care.  Continue on oxygen therapy.   Plan  Patient Instructions  Continue on BREZTRI 2 puffs Twice daily  , rinse after use .  Mucinex DM Twice daily  As needed  Cough/congestion  Albuterol inhaler /neb As needed   Palliative care referral .  Continue on Oxygen At bedtime  .  Work on not smoking .  Follow up with Dr [name] in 3 months and As needed   Please contact office for sooner follow up if symptoms do not improve or worsen or seek emergency care       Protein-calorie malnutrition ([place]) High-protein diet.  Advanced care planning/counseling discussion Palliative care referral.  Chronic respiratory failure with hypoxia ([place]) Continue on nocturnal oxygen at 2 L     Rexene Edison, NP 02/25/2021

## 2021-02-28 ENCOUNTER — Telehealth: Payer: Self-pay

## 2021-02-28 NOTE — Telephone Encounter (Signed)
Attempted to contact patient to schedule a Palliative Care consult appointment. He answered the phone but the service was in and out. I tried to call back and no answer.

## 2021-03-03 NOTE — Progress Notes (Signed)
Agree with the details of the visit as noted by Tammy Parrett, NP.  C. Laura Kea Callan, MD [facility] 

## 2021-03-08 ENCOUNTER — Other Ambulatory Visit: Payer: Self-pay | Admitting: Pulmonary Disease

## 2021-03-09 ENCOUNTER — Telehealth: Payer: Self-pay | Admitting: Adult Health Nurse Practitioner

## 2021-03-09 NOTE — Telephone Encounter (Signed)
Spoke with patient to regarding the Palliative referral and to offer to schedule a visit with [place].  He stated that he is awaiting a call back from his Insurance company to see what they have to offer to help him.  He took my name and number and said that he would call me back after he spoke with them

## 2021-03-18 DIAGNOSIS — R0600 Dyspnea, unspecified: Secondary | ICD-10-CM | POA: Diagnosis not present

## 2021-03-24 ENCOUNTER — Telehealth: Payer: Self-pay | Admitting: Adult Health Nurse Practitioner

## 2021-03-24 NOTE — Telephone Encounter (Signed)
Called patient back and left message, to f/u with him to see if he had spoken with his insurance company and to find out if he wanted to schedule an appointment with [place], left my name and call back number requesting a return call.

## 2021-03-28 ENCOUNTER — Telehealth: Payer: Self-pay | Admitting: Pulmonary Disease

## 2021-03-28 DIAGNOSIS — J9611 Chronic respiratory failure with hypoxia: Secondary | ICD-10-CM

## 2021-03-28 DIAGNOSIS — J449 Chronic obstructive pulmonary disease, unspecified: Secondary | ICD-10-CM

## 2021-03-28 NOTE — Telephone Encounter (Signed)
I have called the pt and he is aware of new referral placed for him to get set up with [facility] for pailliative care.

## 2021-03-28 NOTE — Telephone Encounter (Signed)
Dr. Duwayne Heck please advise if we can use [facility] for the pts palliative care since this is covered by his insurance for a much lower cost to him.  Thanks

## 2021-03-28 NOTE — Telephone Encounter (Signed)
This should be okay.

## 2021-03-29 ENCOUNTER — Encounter: Payer: Self-pay | Admitting: Pulmonary Disease

## 2021-03-30 DIAGNOSIS — H353132 Nonexudative age-related macular degeneration, bilateral, intermediate dry stage: Secondary | ICD-10-CM | POA: Diagnosis not present

## 2021-03-31 ENCOUNTER — Telehealth: Payer: Self-pay | Admitting: Adult Health Nurse Practitioner

## 2021-03-31 DIAGNOSIS — J9610 Chronic respiratory failure, unspecified whether with hypoxia or hypercapnia: Secondary | ICD-10-CM | POA: Diagnosis not present

## 2021-03-31 DIAGNOSIS — J449 Chronic obstructive pulmonary disease, unspecified: Secondary | ICD-10-CM | POA: Diagnosis not present

## 2021-03-31 DIAGNOSIS — Z961 Presence of intraocular lens: Secondary | ICD-10-CM | POA: Diagnosis not present

## 2021-03-31 DIAGNOSIS — F17200 Nicotine dependence, unspecified, uncomplicated: Secondary | ICD-10-CM | POA: Diagnosis not present

## 2021-03-31 DIAGNOSIS — H353230 Exudative age-related macular degeneration, bilateral, stage unspecified: Secondary | ICD-10-CM | POA: Diagnosis not present

## 2021-03-31 DIAGNOSIS — Z9981 Dependence on supplemental oxygen: Secondary | ICD-10-CM | POA: Diagnosis not present

## 2021-03-31 DIAGNOSIS — Z8546 Personal history of malignant neoplasm of prostate: Secondary | ICD-10-CM | POA: Diagnosis not present

## 2021-03-31 NOTE — Telephone Encounter (Signed)
Called patient and left a message asking if he wanted to pursue Palliative services and schedule a visit, left my name and call back number.  I then called and spoke with patient's wife, Judson Roch, and she said that patient has decided to go with [facility] services.  Told wife that I would cancel the referral with [place] and if they changed their mind in the future and want Palliative services with [facility] to please let the MD know so that he can send [place] another referral and she was in agreement with this.

## 2021-04-09 ENCOUNTER — Other Ambulatory Visit: Payer: Self-pay | Admitting: Family Medicine

## 2021-04-11 NOTE — Telephone Encounter (Signed)
Pharmacy requests refill on: Albuterol 108 mcg/act Inhaler   LAST REFILL: 02/23/2021 (Q-18, R-1) LAST OV: 11/03/2020 NEXT OV: Not Scheduled  PHARMACY: [facility] [place], [place]

## 2021-04-17 DIAGNOSIS — R0600 Dyspnea, unspecified: Secondary | ICD-10-CM | POA: Diagnosis not present

## 2021-04-21 DIAGNOSIS — R3915 Urgency of urination: Secondary | ICD-10-CM | POA: Diagnosis not present

## 2021-04-21 DIAGNOSIS — C61 Malignant neoplasm of prostate: Secondary | ICD-10-CM | POA: Diagnosis not present

## 2021-04-30 ENCOUNTER — Other Ambulatory Visit: Payer: Self-pay | Admitting: Pulmonary Disease

## 2021-05-05 ENCOUNTER — Other Ambulatory Visit: Payer: Self-pay | Admitting: Family Medicine

## 2021-05-05 DIAGNOSIS — J9610 Chronic respiratory failure, unspecified whether with hypoxia or hypercapnia: Secondary | ICD-10-CM | POA: Diagnosis not present

## 2021-05-05 DIAGNOSIS — H353230 Exudative age-related macular degeneration, bilateral, stage unspecified: Secondary | ICD-10-CM | POA: Diagnosis not present

## 2021-05-05 DIAGNOSIS — Z961 Presence of intraocular lens: Secondary | ICD-10-CM | POA: Diagnosis not present

## 2021-05-05 DIAGNOSIS — J449 Chronic obstructive pulmonary disease, unspecified: Secondary | ICD-10-CM | POA: Diagnosis not present

## 2021-05-05 DIAGNOSIS — F330 Major depressive disorder, recurrent, mild: Secondary | ICD-10-CM | POA: Diagnosis not present

## 2021-05-05 DIAGNOSIS — F17200 Nicotine dependence, unspecified, uncomplicated: Secondary | ICD-10-CM | POA: Diagnosis not present

## 2021-05-05 DIAGNOSIS — I739 Peripheral vascular disease, unspecified: Secondary | ICD-10-CM | POA: Diagnosis not present

## 2021-05-05 DIAGNOSIS — I951 Orthostatic hypotension: Secondary | ICD-10-CM | POA: Diagnosis not present

## 2021-05-05 DIAGNOSIS — E860 Dehydration: Secondary | ICD-10-CM | POA: Diagnosis not present

## 2021-05-16 ENCOUNTER — Telehealth: Payer: Self-pay | Admitting: *Deleted

## 2021-05-16 DIAGNOSIS — Z720 Tobacco use: Secondary | ICD-10-CM | POA: Diagnosis not present

## 2021-05-16 DIAGNOSIS — Z9981 Dependence on supplemental oxygen: Secondary | ICD-10-CM | POA: Diagnosis not present

## 2021-05-16 DIAGNOSIS — J449 Chronic obstructive pulmonary disease, unspecified: Secondary | ICD-10-CM | POA: Diagnosis not present

## 2021-05-16 NOTE — Telephone Encounter (Signed)
Called patient and was advised that Debby Freiberg NP with [facility] told him that she feels that he may need a cardiac workup to check for Afib. Patient stated that he saw Theadora Rama NP a week ago this past Thursday and she told  him she was going to reach out to Dr. [name] about a workup. Patient stated that his feet have been swollen. Patient stated that [facility] sent out another nurse to do his vital signs this morning and he did a video visit today with them.  Patient stated that he has had some cramping in his feet for several weeks.  Patient stated that he was not given any new medication today by [facility] NP. Patient stated that Dr. [name] can reach out to [facility] if he does not hear back from her.  Telephone numbers [phone] or [phone] Patient stated that he has had some breathing problems but has been using his inhalers which help.  Patient has an upcoming appointment scheduled with his pulmonologist. Patient was given ER/911 precautions and he verbalized understanding.

## 2021-05-16 NOTE — Telephone Encounter (Signed)
Patient left a voicemail stating that he has been using [facility]. Patient stated that he has been having a lot of problems. Patient stated that Dr. [name] will be getting a call from [facility] NP with [facility]. Patient stated that Theadora Rama has talked about Cardiac and Afib.

## 2021-05-17 NOTE — Telephone Encounter (Signed)
Appointment made. Thank you.

## 2021-05-17 NOTE — Telephone Encounter (Signed)
Plz add OV for pt at 12:30 tomorrow.  Pt is not aware of appt.

## 2021-05-17 NOTE — Telephone Encounter (Signed)
With all he has going on may be best to start with OV where we can do EKG and labs if needed.  Can he come tomorrow 12:30pm to be seen?

## 2021-05-17 NOTE — Telephone Encounter (Signed)
Patient is aware of appt  Please schedule for 05/18/21 at 12:30pm. Thank you , EM

## 2021-05-18 ENCOUNTER — Other Ambulatory Visit: Payer: Self-pay

## 2021-05-18 ENCOUNTER — Ambulatory Visit (INDEPENDENT_AMBULATORY_CARE_PROVIDER_SITE_OTHER): Payer: PPO | Admitting: Family Medicine

## 2021-05-18 ENCOUNTER — Encounter: Payer: Self-pay | Admitting: Family Medicine

## 2021-05-18 VITALS — BP 114/64 | HR 105 | Temp 98.4°F | Ht 69.0 in | Wt 146.6 lb

## 2021-05-18 DIAGNOSIS — R600 Localized edema: Secondary | ICD-10-CM | POA: Diagnosis not present

## 2021-05-18 DIAGNOSIS — I700 Atherosclerosis of aorta: Secondary | ICD-10-CM

## 2021-05-18 DIAGNOSIS — R011 Cardiac murmur, unspecified: Secondary | ICD-10-CM

## 2021-05-18 DIAGNOSIS — J449 Chronic obstructive pulmonary disease, unspecified: Secondary | ICD-10-CM | POA: Diagnosis not present

## 2021-05-18 DIAGNOSIS — I10 Essential (primary) hypertension: Secondary | ICD-10-CM

## 2021-05-18 DIAGNOSIS — R42 Dizziness and giddiness: Secondary | ICD-10-CM

## 2021-05-18 DIAGNOSIS — G8929 Other chronic pain: Secondary | ICD-10-CM

## 2021-05-18 DIAGNOSIS — I6523 Occlusion and stenosis of bilateral carotid arteries: Secondary | ICD-10-CM | POA: Diagnosis not present

## 2021-05-18 DIAGNOSIS — M5136 Other intervertebral disc degeneration, lumbar region: Secondary | ICD-10-CM | POA: Diagnosis not present

## 2021-05-18 DIAGNOSIS — J9611 Chronic respiratory failure with hypoxia: Secondary | ICD-10-CM | POA: Diagnosis not present

## 2021-05-18 DIAGNOSIS — F17200 Nicotine dependence, unspecified, uncomplicated: Secondary | ICD-10-CM

## 2021-05-18 DIAGNOSIS — I739 Peripheral vascular disease, unspecified: Secondary | ICD-10-CM

## 2021-05-18 DIAGNOSIS — R000 Tachycardia, unspecified: Secondary | ICD-10-CM | POA: Diagnosis not present

## 2021-05-18 DIAGNOSIS — R0600 Dyspnea, unspecified: Secondary | ICD-10-CM | POA: Diagnosis not present

## 2021-05-18 DIAGNOSIS — I708 Atherosclerosis of other arteries: Secondary | ICD-10-CM

## 2021-05-18 DIAGNOSIS — I272 Pulmonary hypertension, unspecified: Secondary | ICD-10-CM

## 2021-05-18 LAB — COMPREHENSIVE METABOLIC PANEL
ALT: 23 U/L (ref 0–53)
AST: 21 U/L (ref 0–37)
Albumin: 4.1 g/dL (ref 3.5–5.2)
Alkaline Phosphatase: 70 U/L (ref 39–117)
BUN: 14 mg/dL (ref 6–23)
CO2: 32 mEq/L (ref 19–32)
Calcium: 9.7 mg/dL (ref 8.4–10.5)
Chloride: 101 mEq/L (ref 96–112)
Creatinine, Ser: 0.84 mg/dL (ref 0.40–1.50)
GFR: 82.1 mL/min (ref >60.00)
Glucose, Bld: 122 mg/dL — ABNORMAL HIGH (ref 70–99)
Potassium: 5 mEq/L (ref 3.5–5.1)
Sodium: 142 mEq/L (ref 135–145)
Total Bilirubin: 0.6 mg/dL (ref 0.2–1.2)
Total Protein: 6.3 g/dL (ref 6.0–8.3)

## 2021-05-18 LAB — CBC WITH DIFFERENTIAL/PLATELET
Basophils Absolute: 0 10*3/uL (ref 0.0–0.1)
Basophils Relative: 0.2 % (ref 0.0–3.0)
Eosinophils Absolute: 0 10*3/uL (ref 0.0–0.7)
Eosinophils Relative: 0.2 % (ref 0.0–5.0)
HCT: 40.4 % (ref 39.0–52.0)
Hemoglobin: 13.5 g/dL (ref 13.0–17.0)
Lymphocytes Relative: 10.7 % — ABNORMAL LOW (ref 12.0–46.0)
Lymphs Abs: 1.4 10*3/uL (ref 0.7–4.0)
MCHC: 33.5 g/dL (ref 30.0–36.0)
MCV: 97.7 fl (ref 78.0–100.0)
Monocytes Absolute: 0.8 10*3/uL (ref 0.1–1.0)
Monocytes Relative: 6.2 % (ref 3.0–12.0)
Neutro Abs: 10.6 10*3/uL — ABNORMAL HIGH (ref 1.4–7.7)
Neutrophils Relative %: 82.7 % — ABNORMAL HIGH (ref 43.0–77.0)
Platelets: 241 10*3/uL (ref 150.0–400.0)
RBC: 4.13 Mil/uL — ABNORMAL LOW (ref 4.22–5.81)
RDW: 13.3 % (ref 11.5–15.5)
WBC: 12.8 10*3/uL — ABNORMAL HIGH (ref 4.0–10.5)

## 2021-05-18 LAB — BRAIN NATRIURETIC PEPTIDE: Pro B Natriuretic peptide (BNP): 49 pg/mL (ref 0.0–100.0)

## 2021-05-18 LAB — TSH: TSH: 0.79 u[IU]/mL (ref 0.35–4.50)

## 2021-05-18 MED ORDER — TRAMADOL HCL 50 MG PO TABS: 25.0000 mg | ORAL_TABLET | Freq: Two times a day (BID) | ORAL | 0 refills | Status: DC | PRN

## 2021-05-18 NOTE — Progress Notes (Addendum)
Patient ID: Parker Martinez, male    DOB: Jan 12, 1941, 79 y.o.   MRN: [id number]  This visit was conducted in person.  BP 114/64   Pulse (!) 105   Temp 98.4 F (36.9 C) (Temporal)   Ht 5\' 9"  (1.753 m)   Wt 146 lb 9.6 oz (66.5 kg)   SpO2 94%   BMI 21.65 kg/m   BP Readings from Last 3 Encounters:  05/18/21 114/64  02/25/21 (!) 142/68  01/27/21 100/70    CC: cardiac concerns Subjective:   HPI: Parker Martinez is a 80 y.o. male presenting on 05/18/2021 for Hypertension and Tachycardia   Severe end stage COPD, prednisone 10mg  dependent, continued smoker. Uses nocturnal oxygen at 2L. Followed by pulmonology on albuterol inhaler (2-5 times a day) and nebs (takes BID) PRN, allegra daily, astelin nasal spray, Breztri 160/9/4.51mcg 2 puffs BID, singulair 10mg  daily, and prednisone 10mg  daily. Recently established with [facility].   Acute worsening breathing over the last 2-3 weeks associated with chest twinge discomfort throughout ribcage as well as cramping of calves, feet, and hands (manages with tonic water). Ankles and feet swell at times, currently better. Notes increased unsteadiness - wonders about medicines causing this (predominantly alfuzosin).   Denies chest pressure/tightness, chest discomfort is not exertional or relieved by rest.   Recent urgent home visit by [facility] provider through [employer] for COPD exacerbation - recommended continue medications including nebulizers and inhalers, + mucinex for cough/congestion. Was recommended cardiology evaluation due to tachycardia and pedal edema. I received OV from NP Melene Mullings which was reviewed.   Upcoming pulm f/u 05/31/2021 [name]).   Occasional smoker - continues cutting down.   Ongoing lower back pain - managing with acetaminophen 3000-4000mg  daily. Requests hydrocodone refilled. Infrequent use - last filled 2020.   Echocardiogram 05/2020 - LF 60-65%, normal LV function, mild LVH, G1DD, normal RV  systolic function, poorly visualized valves, mild aortic sclerosis, dilated IVC with suggesting RA pressure 72mmHg.      Relevant past medical, surgical, family and social history reviewed and updated as indicated. Interim medical history since our last visit reviewed. Allergies and medications reviewed and updated. Outpatient Medications Prior to Visit  Medication Sig Dispense Refill   dextromethorphan-guaiFENesin (MUCINEX DM) 30-600 MG 12hr tablet Take 1 tablet by mouth 2 (two) times daily.     acetaminophen (TYLENOL) 325 MG tablet Take 1 tablet (325 mg total) by mouth every 6 (six) hours as needed for mild pain, fever or headache (or Fever >/= 101).     albuterol (PROVENTIL) (2.5 MG/3ML) 0.083% nebulizer solution Take 3 mLs (2.5 mg total) by nebulization every 4 (four) hours as needed for wheezing or shortness of breath. 360 mL 2   albuterol (VENTOLIN HFA) 108 (90 Base) MCG/ACT inhaler USE 2 PUFFS EVERY 6 HOURS AS NEEDED FOR WHEEZING 18 each 2   alfuzosin (UROXATRAL) 10 MG 24 hr tablet Take 10 mg by mouth daily with breakfast.     aspirin 81 MG tablet Take 81 mg by mouth every morning.     atorvastatin (LIPITOR) 40 MG tablet TAKE 1 TABLET BY MOUTH EVERY DAY 90 tablet 3   BREZTRI AEROSPHERE 160-9-4.8 MCG/ACT AERO INHALE 2 PUFFS BY MOUTH TWICE A DAY 10.7 g 11   EPINEPHrine (EPIPEN 2-PAK) 0.3 mg/0.3 mL IJ SOAJ injection Inject 0.3 mLs (0.3 mg total) into the muscle as needed for anaphylaxis. 1 each 1   fexofenadine (ALLEGRA) 180 MG tablet Take 180 mg by mouth daily.  ipratropium (ATROVENT) 0.06 % nasal spray PLACE 2 SPRAYS INTO BOTH NOSTRILS 4 (FOUR) TIMES DAILY AS NEEDED FOR RHINITIS. 15 mL 3   montelukast (SINGULAIR) 10 MG tablet TAKE 1 TABLET BY MOUTH EVERYDAY AT BEDTIME 90 tablet 3   Multiple Vitamin (MULTIVITAMIN) tablet Take 1 tablet by mouth daily.     pantoprazole (PROTONIX) 40 MG tablet TAKE 1 TABLET BY MOUTH 2 TIMES DAILY BEFORE A MEAL. 30 MINS BEFORE BREAKFAST AND SUPPER 180 tablet 0    predniSONE (DELTASONE) 10 MG tablet TAKE 1 TABLET (10 MG TOTAL) BY MOUTH DAILY WITH BREAKFAST. 30 tablet 1   vitamin B-12 (CYANOCOBALAMIN) 500 MCG tablet Take 500 mcg by mouth every morning.      azelastine (ASTELIN) 0.1 % nasal spray Place 1 spray into both nostrils 2 (two) times daily. 30 mL 2   Budeson-Glycopyrrol-Formoterol (BREZTRI AEROSPHERE) 160-9-4.8 MCG/ACT AERO Inhale 2 puffs into the lungs in the morning and at bedtime. 32.1 g 3   EPINEPHrine 0.3 mg/0.3 mL IJ SOAJ injection Inject 0.3 mg into the muscle as needed for anaphylaxis.  (Patient not taking: Reported on 02/25/2021)     predniSONE (STERAPRED UNI-PAK 21 TAB) 10 MG (21) TBPK tablet Take as directed in the package (Patient not taking: Reported on 02/25/2021) 21 tablet 0   No facility-administered medications prior to visit.     Per HPI unless specifically indicated in ROS section below Review of Systems Objective:  BP 114/64   Pulse (!) 105   Temp 98.4 F (36.9 C) (Temporal)   Ht 5\' 9"  (1.753 m)   Wt 146 lb 9.6 oz (66.5 kg)   SpO2 94%   BMI 21.65 kg/m   Wt Readings from Last 3 Encounters:  05/18/21 146 lb 9.6 oz (66.5 kg)  02/25/21 147 lb (66.7 kg)  01/27/21 145 lb (65.8 kg)      Physical Exam Vitals and nursing note reviewed.  Constitutional:      Appearance: Normal appearance. He is not ill-appearing.  Eyes:     Extraocular Movements: Extraocular movements intact.     Pupils: Pupils are equal, round, and reactive to light.  Neck:     Thyroid: No thyroid mass or thyromegaly.     Vascular: Carotid bruit (bilat) present.  Cardiovascular:     Rate and Rhythm: Regular rhythm. Tachycardia present.     Pulses: Normal pulses.     Heart sounds: Normal heart sounds. No murmur heard. Pulmonary:     Effort: Pulmonary effort is normal. No respiratory distress.     Breath sounds: Wheezing and rhonchi present. No rales.     Comments: Diffuse wheezing, normal work of breathing Musculoskeletal:     Cervical back:  Normal range of motion and neck supple.     Right lower leg: Edema (1+) present.     Left lower leg: Edema (1+) present.     Comments:  1+ bilateraly pitting edema mid dorsal feet to mid shin Redness to distal feet Diminished pulses bilaterally   Skin:    General: Skin is warm and dry.     Findings: No rash.  Neurological:     Mental Status: He is alert.  Psychiatric:        Mood and Affect: Mood normal.        Behavior: Behavior normal.       Lab Results  Component Value Date   CREATININE 0.84 05/18/2021   BUN 14 05/18/2021   NA 142 05/18/2021   K 5.0 05/18/2021  CL 101 05/18/2021   CO2 32 05/18/2021   Lab Results  Component Value Date   TSH 0.79 05/18/2021    EKG - sinus tachycardia rate 120s, normal axis, intervals,, no hypertrophy or acute ST/T changes, poor R wave progression  Assessment & Plan:  This visit occurred during the SARS-CoV-2 public health emergency.  Safety protocols were in place, including screening questions prior to the visit, additional usage of staff PPE, and extensive cleaning of exam room while observing appropriate contact time as indicated for disinfecting solutions.   Problem List Items Addressed This Visit     Smoker    Continued occasional smoker - encouraged full cessation.        COPD, severe ([place]) - Primary    Has established with pulm and palliative care services - appreciate care of all involved.  Continue Breztri and other respiratory regimen.  This is patient's predominant issue.        Relevant Medications   dextromethorphan-guaiFENesin (MUCINEX DM) 30-600 MG 12hr tablet   HTN (hypertension)    Stable period off antihypertensives - see below.        Relevant Orders   Comprehensive metabolic panel (Completed)   TSH (Completed)   PVD (peripheral vascular disease) with claudication ([place])    Known h/o this, diminished pulses on exam however latest ABIs with improved readings. Denies significant claudication symptoms at  this time. Last saw VVS 2019, released due to stability of disease. Will continue to monitor this. Continue aspirin and lipitor.        Relevant Medications   traMADol (ULTRAM) 50 MG tablet   DDD (degenerative disc disease), lumbar    Trial tramadol sparing PRN use.  [place] CSRS reviewed       Relevant Medications   traMADol (ULTRAM) 50 MG tablet   Carotid stenosis    Due for carotid US - ordered.        Relevant Orders   VAS US CAROTID   Encounter for chronic pain management    [place] CSRS reviewed Trial tramadol PRN pain, reviewed sparing use and possible side effects to monitor for.         Heart murmur    Not appreciated today.  H/o aortic sclerosis on latest [place].        Relevant Orders   ECHOCARDIOGRAM COMPLETE   Aorto-iliac atherosclerosis ([place])    Continue statin.        Pedal edema    Bilateral dependent pedal edema into lower legs without signs of cellulitis or DVT.  Avoid compression given h/o PAD (although overall stable). Discussed leg elevation, good water intake, avoiding salt/sodium in diet.  Don't think this merits cardiology evaluation per see, however see below for PAH.        Relevant Orders   Comprehensive metabolic panel (Completed)   CBC with Differential/Platelet (Completed)   TSH (Completed)   Brain natriuretic peptide (Completed)   Tachycardia    Anticipate related to regular albuterol use as well as deconditioning, severe COPD.        Relevant Orders   EKG 12-Lead (Completed)   Chronic respiratory failure with hypoxia (HCC)    On nocturnal O2 at 2L       Pulmonary hypertension ([place])    Echo from 05/2020 suggesting increased RA pressures however RV systolic function normal. Check BNP - normal today.  Will update echocardiogram in h/o elevated RAP but normal RV function (pointing against cor pulmonale).  Will touch base with Theadora Rama NP with [facility].        Relevant Orders   ECHOCARDIOGRAM COMPLETE   Orthostatic  dizziness    Alfuzosin contributes to endorsed symptoms - rec touch base with urologist about this.          Meds ordered this encounter  Medications   traMADol (ULTRAM) 50 MG tablet    Sig: Take 0.5-1 tablets (25-50 mg total) by mouth 2 (two) times daily as needed for up to 5 days for moderate pain.    Dispense:  30 tablet    Refill:  0    For chronic pain    Orders Placed This Encounter  Procedures   Comprehensive metabolic panel   CBC with Differential/Platelet   TSH   Brain natriuretic peptide   EKG 12-Lead   ECHOCARDIOGRAM COMPLETE    Standing Status:   Future    Standing Expiration Date:   05/19/2022    Order Specific Question:   Where should this test be performed    Answer:   [facility]    Order Specific Question:   Perflutren DEFINITY (image enhancing agent) should be administered unless hypersensitivity or allergy exist    Answer:   Administer Perflutren    Order Specific Question:   Is a special reader required? (athlete or structural heart)    Answer:   No    Order Specific Question:   Does this study need to be read by the Structural team/Level 3 readers?    Answer:   No    Order Specific Question:   Reason for exam-Echo    Answer:   Pulmonary hypertension I27.2   Patient Instructions  Labs and EKG today  I think racing heart is coming from shortness of breath and albuterol use.  Keep legs elevated, increase water intake.    Follow up plan: Return if symptoms worsen or fail to improve.  Ria Bush, MD

## 2021-05-18 NOTE — Patient Instructions (Signed)
Labs and EKG today  I think racing heart is coming from shortness of breath and albuterol use.  Keep legs elevated, increase water intake.

## 2021-05-18 NOTE — Assessment & Plan Note (Addendum)
Has established with pulm and palliative care services - appreciate care of all involved.  Continue Breztri and other respiratory regimen.  This is patient's predominant issue.

## 2021-05-18 NOTE — Assessment & Plan Note (Addendum)
Continued occasional smoker - encouraged full cessation.

## 2021-05-19 DIAGNOSIS — R42 Dizziness and giddiness: Secondary | ICD-10-CM | POA: Insufficient documentation

## 2021-05-19 DIAGNOSIS — I272 Pulmonary hypertension, unspecified: Secondary | ICD-10-CM | POA: Insufficient documentation

## 2021-05-19 DIAGNOSIS — R600 Localized edema: Secondary | ICD-10-CM | POA: Insufficient documentation

## 2021-05-19 NOTE — Telephone Encounter (Signed)
Spoke with [place] who tried to connect me with Theadora Rama, NP but she was not available.  Says she will let Brandy know I called and will have her call back.    Need to relay Dr. Synthia Innocent message.

## 2021-05-19 NOTE — Assessment & Plan Note (Signed)
On nocturnal O2 at 2L

## 2021-05-19 NOTE — Assessment & Plan Note (Signed)
Echo from 05/2020 suggesting increased RA pressures however RV systolic function normal. Check BNP - normal today.  Will update echocardiogram in h/o elevated RAP but normal RV function (pointing against cor pulmonale).  Will touch base with Theadora Rama NP with [facility].

## 2021-05-19 NOTE — Assessment & Plan Note (Signed)
Continue statin. 

## 2021-05-19 NOTE — Assessment & Plan Note (Signed)
Bilateral dependent pedal edema into lower legs without signs of cellulitis or DVT.  Avoid compression given h/o PAD (although overall stable). Discussed leg elevation, good water intake, avoiding salt/sodium in diet.  Don't think this merits cardiology evaluation per see, however see below for PAH.

## 2021-05-19 NOTE — Assessment & Plan Note (Addendum)
Trial tramadol sparing PRN use.  [place] CSRS reviewed

## 2021-05-19 NOTE — Assessment & Plan Note (Signed)
Anticipate related to regular albuterol use as well as deconditioning, severe COPD.

## 2021-05-19 NOTE — Assessment & Plan Note (Addendum)
Known h/o this, diminished pulses on exam however latest ABIs with improved readings. Denies significant claudication symptoms at this time. Last saw VVS 2019, released due to stability of disease. Will continue to monitor this. Continue aspirin and lipitor.

## 2021-05-19 NOTE — Assessment & Plan Note (Signed)
Due for carotid US - ordered.

## 2021-05-19 NOTE — Assessment & Plan Note (Signed)
Stable period off antihypertensives - see below.

## 2021-05-19 NOTE — Assessment & Plan Note (Addendum)
Alfuzosin contributes to endorsed symptoms - rec touch base with urologist about this.

## 2021-05-19 NOTE — Telephone Encounter (Signed)
Plz call Brandy NP with [facility] [phone] or [phone] Pt seen yesterday, EKG showing sinus tachy.  I think predominant issue in this patient is severe COPD.  I think tachycardia is from significant albuterol use and deconditioning from chronic hypoxic resp failure, and pedal edema multifactorial but don't think related to heart failure.  I will update echocardiogram but don't think he necessarily needs cardiology evaluation.  Was there a specific concern she had?

## 2021-05-19 NOTE — Assessment & Plan Note (Addendum)
[place] CSRS reviewed Trial tramadol PRN pain, reviewed sparing use and possible side effects to monitor for.

## 2021-05-19 NOTE — Assessment & Plan Note (Signed)
Not appreciated today.  H/o aortic sclerosis on latest [place].

## 2021-05-20 NOTE — Telephone Encounter (Addendum)
Brandy returning call.  I relayed Dr. Synthia Innocent message.  She verbalizes understanding.  She will document and f/u with pt.  States her concern was pt c/o dyspnea on exertion, elevated HR and worsening pedal edema.

## 2021-05-21 ENCOUNTER — Other Ambulatory Visit: Payer: Self-pay | Admitting: Internal Medicine

## 2021-05-24 DIAGNOSIS — J9610 Chronic respiratory failure, unspecified whether with hypoxia or hypercapnia: Secondary | ICD-10-CM | POA: Diagnosis not present

## 2021-05-24 DIAGNOSIS — Z9981 Dependence on supplemental oxygen: Secondary | ICD-10-CM | POA: Diagnosis not present

## 2021-05-24 DIAGNOSIS — Z720 Tobacco use: Secondary | ICD-10-CM | POA: Diagnosis not present

## 2021-05-24 DIAGNOSIS — J449 Chronic obstructive pulmonary disease, unspecified: Secondary | ICD-10-CM | POA: Diagnosis not present

## 2021-05-27 NOTE — Telephone Encounter (Signed)
Noted  

## 2021-05-27 NOTE — Telephone Encounter (Addendum)
Pt calling office and thought that Rollene Fare was going to call for pt about scheduling tests. I spoke with [facility] and she said that pt can call [facility] at [phone] and ck on scheduling of [place] of carotid and echo. Pt voiced understanding and will call card and if has any problems or needs will call [place] back. Sending note to [facility].

## 2021-05-31 ENCOUNTER — Other Ambulatory Visit: Payer: Self-pay

## 2021-05-31 ENCOUNTER — Ambulatory Visit: Payer: PPO | Admitting: Pulmonary Disease

## 2021-05-31 ENCOUNTER — Encounter: Payer: Self-pay | Admitting: Pulmonary Disease

## 2021-05-31 VITALS — BP 124/70 | HR 100 | Temp 97.6°F | Ht 69.0 in | Wt 143.8 lb

## 2021-05-31 DIAGNOSIS — J449 Chronic obstructive pulmonary disease, unspecified: Secondary | ICD-10-CM

## 2021-05-31 DIAGNOSIS — G4736 Sleep related hypoventilation in conditions classified elsewhere: Secondary | ICD-10-CM | POA: Diagnosis not present

## 2021-05-31 DIAGNOSIS — J439 Emphysema, unspecified: Secondary | ICD-10-CM

## 2021-05-31 DIAGNOSIS — R0609 Other forms of dyspnea: Secondary | ICD-10-CM

## 2021-05-31 DIAGNOSIS — R0600 Dyspnea, unspecified: Secondary | ICD-10-CM | POA: Diagnosis not present

## 2021-05-31 DIAGNOSIS — F17210 Nicotine dependence, cigarettes, uncomplicated: Secondary | ICD-10-CM

## 2021-05-31 MED ORDER — BREZTRI AEROSPHERE 160-9-4.8 MCG/ACT IN AERO: 2.0000 | INHALATION_SPRAY | Freq: Two times a day (BID) | RESPIRATORY_TRACT | 0 refills | Status: DC

## 2021-05-31 NOTE — Patient Instructions (Signed)
Continue Breztri 2 puffs twice a day  We will see him in follow-up in 3 months time call sooner should any new problems arise

## 2021-05-31 NOTE — Telephone Encounter (Addendum)
I think ok to wait as these tests are non-urgent.  Let us know if new symptoms develop.

## 2021-05-31 NOTE — Telephone Encounter (Signed)
Patient advised. I advised patient I will check with Dr Darnell Level to see if these tests need to be moved up and if so patient is ok with going to [facility] to have them done. Waiting for Dr Synthia Innocent response.

## 2021-05-31 NOTE — Progress Notes (Signed)
Subjective:    Patient ID: Parker Martinez, male    DOB: 02/29/1941, 80 y.o.   MRN: [id number]  Chief Complaint  Patient presents with   Follow-up    Sob with exertion and prod cough with white sputum.    Requesting MD/Service: Ria Bush, MD Date of initial consultation: 05/06/18 by Dr. Merton Border Reason for consultation: Severe COPD, smoker   PT PROFILE: 80 y.o. male former smoker (previously > 1 PPD, with diagnosis of COPD rendered several years ago and with progressive DOE over past 5-7 years   DATA: 10/19/10 Spirometry: Severe obstruction (FEV1 41% predicted) 05/28/18 PFTs: FVC: 2.13 > 2.96 L (58 > 80 %pred), FEV1: 0.85 > 1.04 L (30 > 36 %pred), FEV1/FVC: 40 >35%, TLC: 7.90 L (128 %pred), DLCO 50 %pred.  Flow volume curve consistent with severe obstruction 02/20/19 PFTs: FEV1 was 1.96 L or 48% predicted, FVC was 2.72 L or 95% predicted, FEV1/FVC was 38%.  TLC  131%, RV 165%, indicating hyperinflation and air trapping.  Diffusion capacity was 48%.  Findings consistent with severe obstruction and emphysema. 05/05/20 Overnight oximetry: Shows nocturnal desaturations and possible obstructive sleep apnea.  Patient will need sleep study 06/01/20 2D echo: LVEF 60 to 65%, no evidence of cor pulmonale, mild aortic sclerosis, diastolic dysfunction type I. 06/17/2020 chest x-ray two-view: Chronic COPD changes, upper lobe parenchymal scarring, right upper lobe nodules, stable  HPI Parker Martinez presents today for follow-up. He is an 80 year old current smoker (half PPD) who has issues with very severe COPD and chronic nocturnal hypoxemia due to COPD with emphysema. Patient is compliant with oxygen nocturnally. He has not been able to be tested for desaturations during exercise due to inability to walk from multiple back issues. His mobility is extremely limited. He presents today with stable dyspnea.  He notes significant improvement from baseline with Breztri and is compliant with the  medication.  Cough mostly in the mornings, no hemoptysis, whitish sputum production. No hemoptysis. No fevers, chills or sweats. He has tachycardia at baseline.   He does not endorse any other symptomatology.  He notes he has "good days and bad days" today as a "good day".   Review of Systems A 10 point review of systems was performed and it is as noted above otherwise negative.  Patient Active Problem List   Diagnosis Date Noted   Pulmonary hypertension ([place]) 05/19/2021   Orthostatic dizziness 05/19/2021   Chronic respiratory failure with hypoxia ([place]) 02/25/2021   Protein-calorie malnutrition ([place]) 11/04/2020   Shortness of breath 10/26/2020   Tachycardia 10/20/2020   Right foot drop 09/21/2020   Pedal edema 09/07/2020   Aorto-iliac atherosclerosis ([place]) 01/25/2019   Left lumbar radiculopathy 01/24/2019   Bilateral hearing loss 10/21/2018   BPV (benign positional vertigo), right 09/09/2018   Heart murmur 09/09/2018   Encounter for chronic pain management 05/15/2017   Chronic pain in left shoulder 01/30/2017   Subclavian steal syndrome 10/12/2016   Abnormal drug screen    Atherosclerosis of native arteries of extremity with intermittent claudication ([place]) 10/21/2014   Medicare annual wellness visit, subsequent 09/11/2014   Carotid stenosis 09/11/2014   Advanced care planning/counseling discussion 09/11/2014   Vitamin D deficiency 09/01/2014   DDD (degenerative disc disease), lumbar    Personal history of colonic adenomas 09/02/2013   COPD exacerbation ([place]) 03/04/2013   Healthcare maintenance 04/25/2012   Polycythemia secondary to smoking 04/25/2012   Dyslipidemia 04/25/2012   HTN (hypertension)    PVD (peripheral vascular disease) with claudication ([place])  COPD, severe ([place])    Prostate cancer ([place])    GERD (gastroesophageal reflux disease)    Idiopathic anaphylaxis    Smoker 09/13/2011   Esophageal rings - lower esophagus 06/04/2009   Social History   Tobacco Use    Smoking status: Every Day    Packs/day: 1.00    Years: 60.00    Pack years: 60.00    Types: Cigarettes   Smokeless tobacco: Never   Tobacco comments:    8-10 cigs daily--05/31/2021  Substance Use Topics   Alcohol use: Yes    Alcohol/week: 14.0 standard drinks    Types: 14 Cans of beer per week    Comment: socially   Allergies  Allergen Reactions   Flonase [Fluticasone Propionate] Other (See Comments)    Per pt, causes rebound effect    Current Meds  Medication Sig   acetaminophen (TYLENOL) 325 MG tablet Take 1 tablet (325 mg total) by mouth every 6 (six) hours as needed for mild pain, fever or headache (or Fever >/= 101).   albuterol (PROVENTIL) (2.5 MG/3ML) 0.083% nebulizer solution Take 3 mLs (2.5 mg total) by nebulization every 4 (four) hours as needed for wheezing or shortness of breath.   albuterol (VENTOLIN HFA) 108 (90 Base) MCG/ACT inhaler USE 2 PUFFS EVERY 6 HOURS AS NEEDED FOR WHEEZING   alfuzosin (UROXATRAL) 10 MG 24 hr tablet Take 10 mg by mouth daily with breakfast.   aspirin 81 MG tablet Take 81 mg by mouth every morning.   atorvastatin (LIPITOR) 40 MG tablet TAKE 1 TABLET BY MOUTH EVERY DAY   BREZTRI AEROSPHERE 160-9-4.8 MCG/ACT AERO INHALE 2 PUFFS BY MOUTH TWICE A DAY   Budeson-Glycopyrrol-Formoterol (BREZTRI AEROSPHERE) 160-9-4.8 MCG/ACT AERO Inhale 2 puffs into the lungs in the morning and at bedtime.   dextromethorphan-guaiFENesin (MUCINEX DM) 30-600 MG 12hr tablet Take 1 tablet by mouth 2 (two) times daily.   EPINEPHrine (EPIPEN 2-PAK) 0.3 mg/0.3 mL IJ SOAJ injection Inject 0.3 mLs (0.3 mg total) into the muscle as needed for anaphylaxis.   fexofenadine (ALLEGRA) 180 MG tablet Take 180 mg by mouth daily.   ipratropium (ATROVENT) 0.06 % nasal spray PLACE 2 SPRAYS INTO BOTH NOSTRILS 4 (FOUR) TIMES DAILY AS NEEDED FOR RHINITIS.   montelukast (SINGULAIR) 10 MG tablet TAKE 1 TABLET BY MOUTH EVERYDAY AT BEDTIME   Multiple Vitamin (MULTIVITAMIN) tablet Take 1 tablet  by mouth daily.   pantoprazole (PROTONIX) 40 MG tablet Take 1 tablet (40 mg total) by mouth 2 (two) times daily before a meal. Please call to schedule an office visit for further refills. Thank you   predniSONE (DELTASONE) 10 MG tablet TAKE 1 TABLET (10 MG TOTAL) BY MOUTH DAILY WITH BREAKFAST.   vitamin B-12 (CYANOCOBALAMIN) 500 MCG tablet Take 500 mcg by mouth every morning.    Immunization History  Administered Date(s) Administered   Fluad Quad(high Dose 65+) 09/09/2019, 09/07/2020   Influenza,inj,Quad PF,6+ Mos 09/11/2014, 09/24/2015, 09/20/2016, 09/14/2017, 08/29/2018   PFIZER(Purple Top)SARS-COV-2 Vaccination 12/20/2019, 01/10/2020, 09/05/2020   Pneumococcal Conjugate-13 09/11/2014   Pneumococcal Polysaccharide-23 12/04/2009       Objective:   Physical Exam BP 124/70 (BP Location: Left Arm, Cuff Size: Normal)   Pulse 100   Temp 97.6 F (36.4 C) (Temporal)   Ht 5\' 9"  (1.753 m)   Wt 143 lb 12.8 oz (65.2 kg)   SpO2 96%   BMI 21.24 kg/m  GENERAL: Disheveled appearing elderly male, no acute distress.  Use of accessories, chronic.  Presents in transport chair (due to  dyspnea). HEAD: Normocephalic, atraumatic. EYES: Pupils equal, round, reactive to light.  No scleral icterus. MOUTH: Nose/mouth/throat not examined due to masking requirements for COVID 19. NECK: Supple. No thyromegaly. Trachea midline. No JVD.  No adenopathy. PULMONARY: Distant breath sounds, coarse breath sounds with no wheezes noted.   CARDIOVASCULAR: S1 and S2.  Tachycardic rate with regular rhythm.    Grade 1/6 to 2/6 systolic ejection murmur left sternal border. GASTROINTESTINAL: Benign. MUSCULOSKELETAL: No joint deformity, no clubbing, no edema. NEUROLOGIC: No overt focal deficit noted.  Speech is fluent. SKIN: No rashes, port wine nevus on middle finger left hand. PSYCH: Mood and behavior are normal     Assessment & Plan:     ICD-10-CM   1. Stage 4 very severe COPD by GOLD classification ([place])  J44.9     Continue Breztri 2 puffs twice a day Continue as needed albuterol Follow-up in 3 months time    2. Nocturnal hypoxemia due to emphysema (HCC)  J43.9    G47.36    Continue oxygen at 2 L/min nocturnally     3. Severe exertional dyspnea, likely fully explained by COPD  R06.00    Has pending echocardiogram Dyspnea explained by COPD    4. Tobacco dependence due to cigarettes  F17.210    Counseled with regards discontinuation of smoking     Meds ordered this encounter  Medications   Budeson-Glycopyrrol-Formoterol (BREZTRI AEROSPHERE) 160-9-4.8 MCG/ACT AERO    Sig: Inhale 2 puffs into the lungs in the morning and at bedtime.    Dispense:  5.9 g    Refill:  0    Order Specific Question:   Lot Number?    Answer:   [id number]    Order Specific Question:   Expiration Date?    Answer:   11/04/2023    Order Specific Question:   Manufacturer?    Answer:   [employer] [[id number]]    Order Specific Question:   Quantity    Answer:   2   We will see the patient in follow-up in 3 months time he is to contact us prior to that time should any new difficulties arise.  Renold Don, MD [facility]   *This note was dictated using voice recognition software/Dragon.  Despite best efforts to proofread, errors can occur which can change the meaning.  Any change was purely unintentional.

## 2021-05-31 NOTE — Telephone Encounter (Signed)
Spoke with [facility] in [place] and scheduled patient for august 24th-first available, for Echo and Carotid US-2 pm and 3 pm appointment. Need to let patient know

## 2021-05-31 NOTE — Telephone Encounter (Signed)
Vm from pt stating he contacted cardiology, as instructed.  However, told they will not take self referral for 2 heart studies.  Plz advise on how to get appts scheduled.

## 2021-06-01 NOTE — Telephone Encounter (Signed)
Patient advised. Patient will keep [place] posted if new symptoms develop

## 2021-06-17 DIAGNOSIS — R0600 Dyspnea, unspecified: Secondary | ICD-10-CM | POA: Diagnosis not present

## 2021-06-23 ENCOUNTER — Other Ambulatory Visit: Payer: Self-pay | Admitting: Pulmonary Disease

## 2021-06-24 NOTE — Telephone Encounter (Signed)
Patient is requesting refill on prednisone 10mg  daily.    Dr. [name], please advise if okay to order?

## 2021-07-18 DIAGNOSIS — R0600 Dyspnea, unspecified: Secondary | ICD-10-CM | POA: Diagnosis not present

## 2021-07-19 ENCOUNTER — Other Ambulatory Visit: Payer: Self-pay | Admitting: Pulmonary Disease

## 2021-07-21 ENCOUNTER — Telehealth: Payer: Self-pay | Admitting: Pulmonary Disease

## 2021-07-21 MED ORDER — IPRATROPIUM BROMIDE 0.06 % NA SOLN: 2.0000 | Freq: Four times a day (QID) | NASAL | 3 refills | Status: DC | PRN

## 2021-07-21 NOTE — Telephone Encounter (Signed)
Called and spoke with patient he confirmed medication needing to be refilled and preferred pharmacy. Refill has been sent in. Nothing further needed at this time.

## 2021-07-27 ENCOUNTER — Ambulatory Visit (INDEPENDENT_AMBULATORY_CARE_PROVIDER_SITE_OTHER): Payer: PPO

## 2021-07-27 ENCOUNTER — Other Ambulatory Visit: Payer: Self-pay

## 2021-07-27 DIAGNOSIS — I272 Pulmonary hypertension, unspecified: Secondary | ICD-10-CM

## 2021-07-27 DIAGNOSIS — R011 Cardiac murmur, unspecified: Secondary | ICD-10-CM

## 2021-07-27 DIAGNOSIS — I6523 Occlusion and stenosis of bilateral carotid arteries: Secondary | ICD-10-CM

## 2021-07-27 MED ORDER — PERFLUTREN LIPID MICROSPHERE
1.0000 mL | INTRAVENOUS | Status: AC | PRN
  Administered 2021-07-27: 2 mL via INTRAVENOUS

## 2021-07-27 NOTE — Progress Notes (Signed)
[facility]  Date: 07/27/21  I was contacted by Pilar Jarvis to review the patient's echocardiogram, due to intermittent tachycardia noted during the exam and concern for cardiomyopathy.  Mr. Noah reports progressive shortness of breath for ~4 years without acute worsening.  Exam is notable for mildly diminished breath sounds throughout.  Heart sounds are regular w/o murmurs.  No LE edema or JVD noted.  Echo images reviewed at the bedside, showing normal LVEF without critical valvular heart disease.  No pericardial effusion noted.  Rhythm strip shows sinus rhythm with PAC's and brief atrial runs.  Patient is stable for outpatient follow-up with the ordering MD.  Formal cardiology consult can be obtained at the discretion of the ordering provider.  Formal echo interpretation to follow.  Nelva Bush, MD [facility]

## 2021-07-28 ENCOUNTER — Telehealth: Payer: Self-pay

## 2021-07-28 LAB — ECHOCARDIOGRAM COMPLETE
AR max vel: 2.92 cm2
AV Area VTI: 3.35 cm2
AV Area mean vel: 3.31 cm2
AV Mean grad: 2 mmHg
AV Peak grad: 3.7 mmHg
Ao pk vel: 0.96 m/s
Area-P 1/2: 3.81 cm2
S' Lateral: 3.5 cm

## 2021-07-28 NOTE — Telephone Encounter (Signed)
PA request was received from (pharmacy): [facility] [place] [address] VKFMM:[phone] Fax: [phone] Medication name and strength: Albuterol Sulfate 2.5 Mg neb Ordering Provider: Dr. [name]  Was PA started with [facility]?: NO If yes, please enter KEY: N/A Medication tried and failed: duoneb, incruse,albuterol hfa,pulmicort,symbicort,dulera,spiriva,breo Covered Alternatives: N/A  PA sent to plan, time frame for approval / denial: N/A Routing to N/A for follow-up   spoke to Naudia from the PA team,she started and completed the PA.   PA was approved. Pharmacy aware of approval.

## 2021-07-29 ENCOUNTER — Other Ambulatory Visit: Payer: Self-pay | Admitting: Family Medicine

## 2021-08-01 ENCOUNTER — Other Ambulatory Visit: Payer: Self-pay | Admitting: Internal Medicine

## 2021-08-05 ENCOUNTER — Telehealth: Payer: Self-pay | Admitting: Family Medicine

## 2021-08-05 NOTE — Telephone Encounter (Signed)
[name] called in wanted to know about getting a phone call about his results

## 2021-08-09 NOTE — Telephone Encounter (Signed)
Lvm asking pt to call back.  Need to relay ECHO results and notify pt results have been mailed to him.  ECHO: Plz notify heart ultrasound returned overall reassuringly normal. Heart muscle was somewhat stiff (often due to hypertension) but pumping function was normal, no signs of valvular disease.

## 2021-08-10 NOTE — Telephone Encounter (Signed)
Spoke with pt relaying results and notifying him they were mailed to him.  Pt verbalizes understanding and states he did get them.  Pt expresses his thanks for the call.

## 2021-08-11 DIAGNOSIS — Z9981 Dependence on supplemental oxygen: Secondary | ICD-10-CM | POA: Diagnosis not present

## 2021-08-11 DIAGNOSIS — I739 Peripheral vascular disease, unspecified: Secondary | ICD-10-CM | POA: Diagnosis not present

## 2021-08-11 DIAGNOSIS — Z8546 Personal history of malignant neoplasm of prostate: Secondary | ICD-10-CM | POA: Diagnosis not present

## 2021-08-11 DIAGNOSIS — E441 Mild protein-calorie malnutrition: Secondary | ICD-10-CM | POA: Diagnosis not present

## 2021-08-11 DIAGNOSIS — Z6821 Body mass index (BMI) 21.0-21.9, adult: Secondary | ICD-10-CM | POA: Diagnosis not present

## 2021-08-11 DIAGNOSIS — J449 Chronic obstructive pulmonary disease, unspecified: Secondary | ICD-10-CM | POA: Diagnosis not present

## 2021-08-11 DIAGNOSIS — F17210 Nicotine dependence, cigarettes, uncomplicated: Secondary | ICD-10-CM | POA: Diagnosis not present

## 2021-08-11 DIAGNOSIS — J9610 Chronic respiratory failure, unspecified whether with hypoxia or hypercapnia: Secondary | ICD-10-CM | POA: Diagnosis not present

## 2021-08-16 ENCOUNTER — Other Ambulatory Visit: Payer: Self-pay | Admitting: Pulmonary Disease

## 2021-08-16 NOTE — Telephone Encounter (Signed)
Okay to refill prednisone 10 mg 1 daily with 6 refills.  He is prednisone dependent at this point.

## 2021-08-16 NOTE — Telephone Encounter (Signed)
Patient is requesting refill on prednisone 10mg .  Dr. [name], please advise. Thanks

## 2021-08-18 DIAGNOSIS — R0600 Dyspnea, unspecified: Secondary | ICD-10-CM | POA: Diagnosis not present

## 2021-09-12 ENCOUNTER — Other Ambulatory Visit: Payer: Self-pay

## 2021-09-12 ENCOUNTER — Encounter: Payer: Self-pay | Admitting: Pulmonary Disease

## 2021-09-12 ENCOUNTER — Ambulatory Visit: Payer: PPO | Admitting: Pulmonary Disease

## 2021-09-12 VITALS — BP 110/60 | HR 113 | Temp 97.1°F | Ht 69.0 in | Wt 150.4 lb

## 2021-09-12 DIAGNOSIS — J449 Chronic obstructive pulmonary disease, unspecified: Secondary | ICD-10-CM

## 2021-09-12 DIAGNOSIS — R0609 Other forms of dyspnea: Secondary | ICD-10-CM

## 2021-09-12 DIAGNOSIS — R918 Other nonspecific abnormal finding of lung field: Secondary | ICD-10-CM | POA: Diagnosis not present

## 2021-09-12 DIAGNOSIS — G4736 Sleep related hypoventilation in conditions classified elsewhere: Secondary | ICD-10-CM

## 2021-09-12 DIAGNOSIS — J439 Emphysema, unspecified: Secondary | ICD-10-CM | POA: Diagnosis not present

## 2021-09-12 DIAGNOSIS — J479 Bronchiectasis, uncomplicated: Secondary | ICD-10-CM

## 2021-09-12 DIAGNOSIS — F17210 Nicotine dependence, cigarettes, uncomplicated: Secondary | ICD-10-CM | POA: Diagnosis not present

## 2021-09-12 NOTE — Patient Instructions (Addendum)
Use your nebulizer with albuterol about 20 minutes to an hour before you use to Breztri in the morning and in the evening.  This will help open up your lungs and get the Breztri in deeper into your lungs.  Continue using your nighttime oxygen.   Have provided you with a flutter valve to help you clear secretions.   Are going to get a CT of the chest to evaluate for condition called bronchiectasis.   We will see him in follow-up in 3 months time call sooner should any new problems arise.

## 2021-09-12 NOTE — Progress Notes (Signed)
Subjective:    Patient ID: Parker Martinez, male    DOB: 06-Feb-1941, 80 y.o.   MRN: [id number] Chief Complaint  Patient presents with   Follow-up    Pt states dyspnea and chronic cough   Requesting MD/Service: Ria Bush, MD Date of initial consultation: 05/06/18 by Dr. Merton Border Reason for consultation: Severe COPD, smoker   PT PROFILE: 80 y.o. male former smoker (previously > 1 PPD, with diagnosis of COPD rendered several years ago and with progressive DOE over past 5-7 years   DATA: 10/19/10 Spirometry: Severe obstruction (FEV1 41% predicted) 05/28/18 PFTs: FVC: 2.13 > 2.96 L (58 > 80 %pred), FEV1: 0.85 > 1.04 L (30 > 36 %pred), FEV1/FVC: 40 >35%, TLC: 7.90 L (128 %pred), DLCO 50 %pred.  Flow volume curve consistent with severe obstruction 02/20/19 PFTs: FEV1 was 1.96 L or 48% predicted, FVC was 2.72 L or 95% predicted, FEV1/FVC was 38%.  TLC  131%, RV 165%, indicating hyperinflation and air trapping.  Diffusion capacity was 48%.  Findings consistent with severe obstruction and emphysema. 05/05/20 Overnight oximetry: Shows nocturnal desaturations and possible obstructive sleep apnea.  Patient will need sleep study 06/01/20 2D echo: LVEF 60 to 65%, no evidence of cor pulmonale, mild aortic sclerosis, diastolic dysfunction type I. 06/17/2020 chest x-ray two-view: Chronic COPD changes, upper lobe parenchymal scarring, right upper lobe nodules, stable    HPI Parker Martinez is an 80 year old current smoker (5 to 6 cigarettes/day) who presents for follow-up on very severe COPD with chronic debilitating dyspnea.  We last saw him on 31 May 2021.  He has nocturnal hypoxemia due to emphysema and uses oxygen at 2 L/min nocturnally.  He is compliant with the oxygen.  He notes that this alleviates his symptoms somewhat.  Cardiac function shows grade 2 DD but LVEF is stable at 55%.  No evidence of pulmonary hypertension.  He is compliant with Breztri.  Feels that this helps him.  At a prior  visit he was given a spacer and this has helped with the deposition on the medication.  He still has issues with very tenacious and copious secretions.  He states that he is still smoking because the cigarettes help him mobilize secretions.  He uses his nebulizer albuterol, only rarely.  Does note some relief of his secretions with a nebulizer.  He has not had any fevers, chills or sweats.  Sputum is whitish to grayish.  No other discoloration.  No hemoptysis.  Does not endorse any other complaint today.   Review of Systems A 10 point review of systems was performed and it is as noted above otherwise negative.  Patient Active Problem List   Diagnosis Date Noted   Pulmonary hypertension ([place]) 05/19/2021   Orthostatic dizziness 05/19/2021   Chronic respiratory failure with hypoxia ([place]) 02/25/2021   Protein-calorie malnutrition ([place]) 11/04/2020   Shortness of breath 10/26/2020   Tachycardia 10/20/2020   Right foot drop 09/21/2020   Pedal edema 09/07/2020   Aorto-iliac atherosclerosis ([place]) 01/25/2019   Left lumbar radiculopathy 01/24/2019   Bilateral hearing loss 10/21/2018   BPV (benign positional vertigo), right 09/09/2018   Heart murmur 09/09/2018   Encounter for chronic pain management 05/15/2017   Chronic pain in left shoulder 01/30/2017   Subclavian steal syndrome 10/12/2016   Abnormal drug screen    Atherosclerosis of native arteries of extremity with intermittent claudication ([place]) 10/21/2014   Medicare annual wellness visit, subsequent 09/11/2014   Carotid stenosis 09/11/2014   Advanced care planning/counseling discussion 09/11/2014  Vitamin D deficiency 09/01/2014   DDD (degenerative disc disease), lumbar    Personal history of colonic adenomas 09/02/2013   COPD exacerbation ([place]) 03/04/2013   Healthcare maintenance 04/25/2012   Polycythemia secondary to smoking 04/25/2012   Dyslipidemia 04/25/2012   HTN (hypertension)    PVD (peripheral vascular disease) with  claudication (HCC)    COPD, severe (HCC)    Prostate cancer (HCC)    GERD (gastroesophageal reflux disease)    Idiopathic anaphylaxis    Smoker 09/13/2011   Esophageal rings - lower esophagus 06/04/2009   Social History   Tobacco Use   Smoking status: Every Day    Packs/day: 1.00    Years: 60.00    Pack years: 60.00    Types: Cigarettes   Smokeless tobacco: Never   Tobacco comments:    8-10 cigs daily--05/31/2021         Smoke occasionally 1 pack x week 09/12/21  Substance Use Topics   Alcohol use: Yes    Alcohol/week: 14.0 standard drinks    Types: 14 Cans of beer per week    Comment: socially   Allergies  Allergen Reactions   Flonase [Fluticasone Propionate] Other (See Comments)    Per pt, causes rebound effect    Current Meds  Medication Sig   acetaminophen (TYLENOL) 325 MG tablet Take 1 tablet (325 mg total) by mouth every 6 (six) hours as needed for mild pain, fever or headache (or Fever >/= 101).   albuterol (PROVENTIL) (2.5 MG/3ML) 0.083% nebulizer solution Take 3 mLs (2.5 mg total) by nebulization every 4 (four) hours as needed for wheezing or shortness of breath.   albuterol (VENTOLIN HFA) 108 (90 Base) MCG/ACT inhaler USE 2 PUFFS EVERY 6 HOURS AS NEEDED FOR WHEEZING   alfuzosin (UROXATRAL) 10 MG 24 hr tablet Take 10 mg by mouth daily with breakfast.   aspirin 81 MG tablet Take 81 mg by mouth every morning.   atorvastatin (LIPITOR) 40 MG tablet TAKE 1 TABLET BY MOUTH EVERY DAY   BREZTRI AEROSPHERE 160-9-4.8 MCG/ACT AERO INHALE 2 PUFFS BY MOUTH TWICE A DAY   dextromethorphan-guaiFENesin (MUCINEX DM) 30-600 MG 12hr tablet Take 1 tablet by mouth 2 (two) times daily.   EPINEPHrine (EPIPEN 2-PAK) 0.3 mg/0.3 mL IJ SOAJ injection Inject 0.3 mLs (0.3 mg total) into the muscle as needed for anaphylaxis.   fexofenadine (ALLEGRA) 180 MG tablet Take 180 mg by mouth daily.   ipratropium (ATROVENT) 0.06 % nasal spray Place 2 sprays into both nostrils 4 (four) times daily as  needed for rhinitis.   montelukast (SINGULAIR) 10 MG tablet TAKE 1 TABLET BY MOUTH EVERYDAY AT BEDTIME   Multiple Vitamin (MULTIVITAMIN) tablet Take 1 tablet by mouth daily.   pantoprazole (PROTONIX) 40 MG tablet TAKE 1 TABLET (40 MG TOTAL) BY MOUTH 2 (TWO) TIMES DAILY BEFORE A MEAL. PLEASE CALL TO SCHEDULE AN OFFICE VISIT FOR FURTHER REFILLS. THANK YOU   predniSONE (DELTASONE) 10 MG tablet TAKE 1 TABLET (10 MG TOTAL) BY MOUTH DAILY WITH BREAKFAST.   vitamin B-12 (CYANOCOBALAMIN) 500 MCG tablet Take 500 mcg by mouth every morning.    [DISCONTINUED] Budeson-Glycopyrrol-Formoterol (BREZTRI AEROSPHERE) 160-9-4.8 MCG/ACT AERO Inhale 2 puffs into the lungs in the morning and at bedtime.   Immunization History  Administered Date(s) Administered   Fluad Quad(high Dose 65+) 09/09/2019, 09/07/2020   Influenza,inj,Quad PF,6+ Mos 09/11/2014, 09/24/2015, 09/20/2016, 09/14/2017, 08/29/2018   PFIZER(Purple Top)SARS-COV-2 Vaccination 12/20/2019, 01/10/2020, 09/05/2020   Pneumococcal Conjugate-13 09/11/2014   Pneumococcal Polysaccharide-23 12/04/2009  Objective:   Physical Exam BP 110/60 (BP Location: Left Arm, Patient Position: Sitting, Cuff Size: Normal)   Pulse (!) 113   Temp (!) 97.1 F (36.2 C)   Ht 5\' 9"  (1.753 m)   Wt 150 lb 6.4 oz (68.2 kg)   SpO2 96%   BMI 22.21 kg/m  GENERAL: Disheveled appearing elderly male, no acute distress.  Use of accessories, chronic.  Presents in transport chair (due to dyspnea). HEAD: Normocephalic, atraumatic. EYES: Pupils equal, round, reactive to light.  No scleral icterus. MOUTH: Nose/mouth/throat not examined due to masking requirements for COVID 19. NECK: Supple. No thyromegaly. Trachea midline. No JVD.  No adenopathy. PULMONARY: Distant breath sounds, poor air movement, coarse breath sounds with no wheezes noted.   CARDIOVASCULAR: S1 and S2.  Tachycardic rate with regular rhythm.    Grade 1/6 to 2/6 systolic ejection murmur left sternal  border. GASTROINTESTINAL: Benign. MUSCULOSKELETAL: No joint deformity, no clubbing, no edema. NEUROLOGIC: No overt focal deficit noted.  Speech is fluent. SKIN: No rashes, port wine nevus on middle finger left hand. PSYCH: Mood and behavior are normal     Assessment & Plan:     ICD-10-CM   1. Stage 4 very severe COPD by GOLD classification ([place])  J44.9 CT Chest Wo Contrast    Flutter valve   Commended using albuterol nebulizer before Breztri twice a day Recommend stop smoking    2. Bronchiectasis without complication ([place]) - suspected  J47.9    Will obtain CT to better evaluate Flutter valve for secretion clearance    3. Severe exertional dyspnea, likely fully explained by COPD  R06.09    Continues to be very limited due to this Could not do ambulatory oximetry due to limitation in ambulation    4. Nocturnal hypoxemia due to emphysema (HCC)  J43.9    G47.36    Continue oxygen supplementation nocturnally Patient is compliant 2 L/min during sleep    5. Tobacco dependence due to cigarettes  F17.210    Patient counseled regards to discontinuation of smoking     Orders Placed This Encounter  Procedures   CT Chest Wo Contrast    Standing Status:   Future    Standing Expiration Date:   09/12/2022    Scheduling Instructions:     Next available.    Order Specific Question:   Preferred imaging location?    Answer:   [facility]   Flutter valve   Overall no significant changes in his status.  He was instructed to use his nebulizer with albuterol about 20 minutes to an hour before the Breztri in the morning and in the evening.  Hopefully this will help him deposit the [facility] better in the lungs.  Currently he does not want to switch to all medications by nebulizer.  He is to continue use of nighttime oxygen.  He appears to have some issues perhaps with bronchiectasis we will get CT scan of the chest to evaluate for this may need chest vest for airway clearance.  We will  see him in follow-up in 3 months time he is to call us prior to that time should any new difficulties arise.   Parker Don, MD Advanced [facility]ch[facility]    *This note was dictated using voice recognition software/Dragon.  Despite best efforts to proofread, errors can occur which can change the meaning.  Any change was purely unintentional.

## 2021-09-17 DIAGNOSIS — R0600 Dyspnea, unspecified: Secondary | ICD-10-CM | POA: Diagnosis not present

## 2021-09-29 ENCOUNTER — Other Ambulatory Visit: Payer: Self-pay | Admitting: Pulmonary Disease

## 2021-09-30 ENCOUNTER — Other Ambulatory Visit: Payer: Self-pay

## 2021-09-30 ENCOUNTER — Ambulatory Visit
Admission: RE | Admit: 2021-09-30 | Discharge: 2021-09-30 | Disposition: A | Discharge status: Home / Self Care | Payer: PPO | Source: Ambulatory Visit | Attending: Pulmonary Disease | Admitting: Pulmonary Disease

## 2021-09-30 DIAGNOSIS — J439 Emphysema, unspecified: Secondary | ICD-10-CM | POA: Diagnosis not present

## 2021-09-30 DIAGNOSIS — J479 Bronchiectasis, uncomplicated: Secondary | ICD-10-CM | POA: Diagnosis not present

## 2021-09-30 DIAGNOSIS — J449 Chronic obstructive pulmonary disease, unspecified: Secondary | ICD-10-CM | POA: Diagnosis not present

## 2021-09-30 DIAGNOSIS — I700 Atherosclerosis of aorta: Secondary | ICD-10-CM | POA: Diagnosis not present

## 2021-09-30 DIAGNOSIS — R911 Solitary pulmonary nodule: Secondary | ICD-10-CM | POA: Diagnosis not present

## 2021-09-30 DIAGNOSIS — R0602 Shortness of breath: Secondary | ICD-10-CM | POA: Diagnosis not present

## 2021-10-04 ENCOUNTER — Telehealth: Payer: Self-pay | Admitting: Pulmonary Disease

## 2021-10-04 MED ORDER — IPRATROPIUM BROMIDE 0.06 % NA SOLN: 2.0000 | Freq: Four times a day (QID) | NASAL | 3 refills | Status: DC | PRN

## 2021-10-04 NOTE — Addendum Note (Signed)
Addended by: Tyler Pita on: 10/04/2021 01:51 PM   Modules accepted: Orders

## 2021-10-04 NOTE — Telephone Encounter (Signed)
Noted. Will close encounter.   Thanks

## 2021-10-04 NOTE — Telephone Encounter (Signed)
He has been contacted and appropriate orders placed.

## 2021-10-04 NOTE — Telephone Encounter (Signed)
LMTCB   I do not see where anyone has contacted him. I do see a recent CT. No recent labs. Last OV 09/12/21.   CG please advise. Thanks.

## 2021-10-06 ENCOUNTER — Telehealth: Payer: Self-pay | Admitting: Pulmonary Disease

## 2021-10-06 DIAGNOSIS — H353132 Nonexudative age-related macular degeneration, bilateral, intermediate dry stage: Secondary | ICD-10-CM | POA: Diagnosis not present

## 2021-10-06 NOTE — Telephone Encounter (Signed)
I have spoke with Parker Martinez and he is aware of the PET scan appt

## 2021-10-18 DIAGNOSIS — R0600 Dyspnea, unspecified: Secondary | ICD-10-CM | POA: Diagnosis not present

## 2021-10-20 ENCOUNTER — Ambulatory Visit
Admission: RE | Admit: 2021-10-20 | Discharge: 2021-10-20 | Disposition: A | Discharge status: Home / Self Care | Payer: PPO | Source: Ambulatory Visit | Attending: Pulmonary Disease | Admitting: Pulmonary Disease

## 2021-10-20 ENCOUNTER — Other Ambulatory Visit: Payer: Self-pay

## 2021-10-20 DIAGNOSIS — I6529 Occlusion and stenosis of unspecified carotid artery: Secondary | ICD-10-CM | POA: Diagnosis not present

## 2021-10-20 DIAGNOSIS — N202 Calculus of kidney with calculus of ureter: Secondary | ICD-10-CM | POA: Diagnosis not present

## 2021-10-20 DIAGNOSIS — J439 Emphysema, unspecified: Secondary | ICD-10-CM | POA: Insufficient documentation

## 2021-10-20 DIAGNOSIS — I2510 Atherosclerotic heart disease of native coronary artery without angina pectoris: Secondary | ICD-10-CM | POA: Insufficient documentation

## 2021-10-20 DIAGNOSIS — R911 Solitary pulmonary nodule: Secondary | ICD-10-CM | POA: Diagnosis not present

## 2021-10-20 DIAGNOSIS — I700 Atherosclerosis of aorta: Secondary | ICD-10-CM | POA: Insufficient documentation

## 2021-10-20 DIAGNOSIS — R918 Other nonspecific abnormal finding of lung field: Secondary | ICD-10-CM | POA: Diagnosis not present

## 2021-10-20 LAB — GLUCOSE, CAPILLARY: Glucose-Capillary: 83 mg/dL (ref 70–99)

## 2021-10-20 MED ORDER — FLUDEOXYGLUCOSE F - 18 (FDG) INJECTION
7.8000 | Freq: Once | INTRAVENOUS | Status: AC | PRN
  Administered 2021-10-20: 10:00:00 7.92 via INTRAVENOUS

## 2021-10-21 ENCOUNTER — Other Ambulatory Visit: Payer: Self-pay

## 2021-10-21 DIAGNOSIS — R911 Solitary pulmonary nodule: Secondary | ICD-10-CM

## 2021-10-25 ENCOUNTER — Ambulatory Visit: Payer: PPO

## 2021-10-26 ENCOUNTER — Other Ambulatory Visit: Payer: Self-pay | Admitting: Family Medicine

## 2021-10-26 NOTE — Telephone Encounter (Signed)
E-scribed refill.  Plz schedule wellness, lab and cpe visits.  

## 2021-10-27 ENCOUNTER — Other Ambulatory Visit: Payer: Self-pay | Admitting: Internal Medicine

## 2021-10-30 NOTE — Progress Notes (Signed)
Subjective:   Parker Martinez is a 80 y.o. male who presents for Medicare Annual/Subsequent preventive examination.  I connected with Parker Martinez today by telephone and verified that I am speaking with the correct person using two identifiers. Location patient: home Location provider: work Persons participating in the virtual visit: patient, Parker Martinez.    I discussed the limitations, risks, security and privacy concerns of performing an evaluation and management service by telephone and the availability of in person appointments. I also discussed with the patient that there may be a patient responsible charge related to this service. The patient expressed understanding and verbally consented to this telephonic visit.    Interactive audio and video telecommunications were attempted between this provider and patient, however failed, due to patient having technical difficulties OR patient did not have access to video capability.  We continued and completed visit with audio only.  Some vital signs may be absent or patient reported.   Time Spent with patient on telephone encounter: 20 minutes  Review of Systems     Cardiac Risk Factors include: advanced age (>34men, >25 women);hypertension     Objective:    Today's Vitals   11/02/21 0944 11/02/21 0945  Weight: 150 lb (68 kg)   Height: 5\' 9"  (1.753 m)   PainSc:  4    Body mass index is 22.15 kg/m.  Advanced Directives 11/02/2021 10/26/2020 10/20/2020 02/16/2020 01/26/2020 10/20/2019 02/20/2019  Does Patient Have a Medical Advance Directive? Yes Yes Yes Yes Yes Yes Yes  Type of Paramedic of [place];Living will [place];Living will [place];Living will [place];Living will [place];Living will [place];Living will [place];Living will  Does patient want to make changes to medical advance  directive? Yes (MAU/Ambulatory/Procedural Areas - Information given) No - Patient declined - No - Patient declined - - No - Patient declined  Copy of [place] in Chart? - No - copy requested No - copy requested Yes - validated most recent copy scanned in chart (See row information) - No - copy requested Yes - validated most recent copy scanned in chart (See row information)    Current Medications (verified) Outpatient Encounter Medications as of 11/02/2021  Medication Sig   acetaminophen (TYLENOL) 325 MG tablet Take 1 tablet (325 mg total) by mouth every 6 (six) hours as needed for mild pain, fever or headache (or Fever >/= 101).   albuterol (PROVENTIL) (2.5 MG/3ML) 0.083% nebulizer solution Take 3 mLs (2.5 mg total) by nebulization every 4 (four) hours as needed for wheezing or shortness of breath.   albuterol (VENTOLIN HFA) 108 (90 Base) MCG/ACT inhaler USE 2 PUFFS EVERY 6 HOURS AS NEEDED FOR WHEEZING   alfuzosin (UROXATRAL) 10 MG 24 hr tablet Take 10 mg by mouth daily with breakfast.   aspirin 81 MG tablet Take 81 mg by mouth every morning.   atorvastatin (LIPITOR) 40 MG tablet TAKE 1 TABLET BY MOUTH EVERY DAY   BREZTRI AEROSPHERE 160-9-4.8 MCG/ACT AERO INHALE 2 PUFFS BY MOUTH TWICE A DAY   dextromethorphan-guaiFENesin (MUCINEX DM) 30-600 MG 12hr tablet Take 1 tablet by mouth 2 (two) times daily.   EPINEPHrine (EPIPEN 2-PAK) 0.3 mg/0.3 mL IJ SOAJ injection Inject 0.3 mLs (0.3 mg total) into the muscle as needed for anaphylaxis.   fexofenadine (ALLEGRA) 180 MG tablet Take 180 mg by mouth daily.   ipratropium (ATROVENT) 0.06 % nasal spray Place 2 sprays into  both nostrils 4 (four) times daily as needed for rhinitis.   montelukast (SINGULAIR) 10 MG tablet TAKE 1 TABLET BY MOUTH EVERYDAY AT BEDTIME   Multiple Vitamin (MULTIVITAMIN) tablet Take 1 tablet by mouth daily.   pantoprazole (PROTONIX) 40 MG tablet TAKE 1 TABLET (40 MG TOTAL) BY MOUTH 2 (TWO) TIMES DAILY BEFORE A MEAL.  PLEASE CALL TO SCHEDULE AN OFFICE VISIT FOR FURTHER REFILLS. THANK YOU   predniSONE (DELTASONE) 10 MG tablet TAKE 1 TABLET (10 MG TOTAL) BY MOUTH DAILY WITH BREAKFAST.   vitamin B-12 (CYANOCOBALAMIN) 500 MCG tablet Take 500 mcg by mouth every morning.    No facility-administered encounter medications on file as of 11/02/2021.    Allergies (verified) Flonase [fluticasone propionate]   History: Past Medical History:  Diagnosis Date   Abnormal drug screen 10/2013, 03/2015   low Cr, +EtOH, neg hydrocodone (10/2013, again 03/2015)   Allergic rhinitis    Arthritis    lumbar and cervical spine   Carotid stenosis 09/11/2014   40-59% bilat ICA stenosis, rpt 1 yr (09/2014)    COPD (chronic obstructive pulmonary disease) ([place])    severe, participated in [facility] study   DDD (degenerative disc disease)    lumbar (HNP R L3/4) s/p ESI, cervical   Diverticulitis 2015   by CT   Diverticulosis 2008   severe by colonoscopy and CT   Duodenitis    Esophageal stricture 2012   s/p dilation   GERD (gastroesophageal reflux disease)    Hemangioma    congenital left arm   History of kidney stones remote   HTN (hypertension)    borderline readings at home   Idiopathic anaphylaxis    h/o recurrent anaphylaxis, unknown trigger, s/p eval at [facility] [phone]), last episode 03/2011   PAD (peripheral artery disease) ([place])    Personal history of colonic adenomas 09/02/2013   Prostate cancer ([place]) 2011   prostate seed implant (Dr. [name])   Smoker    Status post dilatation of esophageal stricture    Subclavian steal syndrome 10/12/2016   Left subclavian steal, with 18 mmHg brachial artery pressure gradient (10/2016). If symptomatic, rec PV consult   Past Surgical History:  Procedure Laterality Date   BALLOON DILATION N/A 10/02/2014   Procedure: BALLOON DILATION;  Surgeon: Gatha Mayer, MD;  Location: [facility];  Service: Endoscopy;  Laterality: N/A;   CATARACT EXTRACTION W/PHACO Right  01/26/2020   Procedure: CATARACT EXTRACTION PHACO AND INTRAOCULAR LENS PLACEMENT (IOC) RIGHT 5.15  00:42.9;  Surgeon: [name], MD;  Location: [facility];  Service: Ophthalmology;  Laterality: Right;   CATARACT EXTRACTION W/PHACO Left 02/16/2020   Procedure: CATARACT EXTRACTION PHACO AND INTRAOCULAR LENS PLACEMENT (IOC) LEFT 3.87  00:45.4;  Surgeon: [name], MD;  Location: [facility];  Service: Ophthalmology;  Laterality: Left;   COLONOSCOPY  08/2013   4 tubular adenomas, severe divertic, rpt 3 yrs [name])   COLONOSCOPY  10/2016   severe diverticulosis, no f/u recommended [name])   ESOPHAGOGASTRODUODENOSCOPY  10/2016   2 rings dilated [name])   ESOPHAGOGASTRODUODENOSCOPY  01/2019   dilated esophageal webs [name])   ESOPHAGOGASTRODUODENOSCOPY (EGD) WITH PROPOFOL N/A 10/02/2014   WNL, mild esophagitis; Gatha Mayer, MD   INSERTION PROSTATE RADIATION SEED  01/2015   [name]   LUMBAR MICRODISCECTOMY Left 03/2019   L L4/L5 [name])   [place]   due to chronic dislocation   SPIROMETRY  10/2010   severe obstruction, FEV1 41%, ratio 0.46  TONSILLECTOMY  1944 ?   UPPER GASTROINTESTINAL ENDOSCOPY  06/08/2009   dilated esophageal stricture, duodenitis   virtual colonoscopy  2007   [name], rpt rec 5 yrs   Family History  Problem Relation Age of Onset   Lung cancer Father    Hyperlipidemia Father    Arthritis Mother    Breast cancer Daughter    Coronary artery disease Neg Hx    Stroke Neg Hx    Diabetes Neg Hx    Social History   Socioeconomic History   Marital status: Married    Spouse name: Not on file   Number of children: 1   Years of education: Not on file   Highest education level: Not on file  Occupational History   Occupation: retired    Fish farm manager: RETIRED  Tobacco Use   Smoking status: Every Day    Packs/day: 1.00    Years: 60.00    Pack years: 60.00    Types: Cigarettes   Smokeless tobacco: Never   Tobacco  comments:    8-10 cigs daily--05/31/2021         Smoke occasionally 1 pack x week 09/12/21  Vaping Use   Vaping Use: Never used  Substance and Sexual Activity   Alcohol use: Yes    Alcohol/week: 14.0 standard drinks    Types: 14 Cans of beer per week    Comment: socially   Drug use: No   Sexual activity: Not on file  Other Topics Concern   Not on file  Social History Narrative   Caffeine: 2-4 cups coffee   Lives with wife, 1 bulldog, 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 Wife is Ladean Raya. "no extraordinary efforts" does not want feeding tube.      Tested negative for hydrocodone and positive for alcohol on UDS 10/2013.  Recommend Q6 mo testing and will need to discuss EtOH use at next visit.   Social Determinants of Health   Financial Resource Strain: Low Risk    Difficulty of Paying Living Expenses: Not hard at all  Food Insecurity: No Food Insecurity   Worried About Charity fundraiser in the Last Year: Never true   [place] in the Last Year: Never true  Transportation Needs: No Transportation Needs   Lack of Transportation (Medical): No   Lack of Transportation (Non-Medical): No  Physical Activity: Sufficiently Active   Days of Exercise per Week: 4 days   Minutes of Exercise per Session: 40 min  Stress: No Stress Concern Present   Feeling of Stress : Not at all  Social Connections: Moderately Isolated   Frequency of Communication with Friends and Family: Three times a week   Frequency of Social Gatherings with Friends and Family: Once a week   Attends Religious Services: Never   Parker Martinez or Organizations: No   Attends Music therapist: Not on file   Marital Status: Married    Tobacco Counseling Ready to quit: Not  Answered Counseling given: Not Answered Tobacco comments: 8-10 cigs daily--05/31/2021   Smoke occasionally 1 pack x week 09/12/21   Clinical Intake:  Pre-visit preparation completed: Yes  Pain : 0-10 Pain Score: 4  Pain Location: Other (Comment) (sciatic pain)     BMI - recorded: 22.2 Nutritional Status:  BMI of 19-24  Normal Nutritional Risks: None Diabetes: No  How often do you need to have someone help you when you read instructions, pamphlets, or other written materials from your doctor or pharmacy?: 1 - Never  Diabetic?No  Interpreter Needed?: No  Information entered by :: Orrin Brigham LPN   Activities of Daily Living In your present state of health, do you have any difficulty performing the following activities: 11/02/2021  Hearing? Y  Vision? N  Difficulty concentrating or making decisions? N  Walking or climbing stairs? Y  Comment uses cane  Dressing or bathing? N  Doing errands, shopping? Y  Comment depends patient states having COPD  Preparing Food and eating ? N  Using the Toilet? N  In the past six months, have you accidently leaked urine? Y  Do you have problems with loss of bowel control? N  Managing your Medications? N  Managing your Finances? N  Housekeeping or managing your Housekeeping? N  Some recent data might be hidden    Patient Care Team: Ria Bush, MD as PCP - General (Family Medicine) [name], MD as Consulting Physician (Ophthalmology) Angelia Mould, MD as Consulting Physician (Vascular Surgery) Gatha Mayer, MD as Consulting Physician (Gastroenterology) Myrlene Broker, MD as Attending Physician (Urology) Wilhelmina Mcardle, MD (Inactive) as Consulting Physician (Pulmonary Disease) Debbora Dus, [facility] as Pharmacist (Pharmacist)  Indicate any recent Medical Services you may have received from other than [facility] providers in the past year (date may be approximate).     Assessment:   This is a  routine wellness examination for Parker Martinez.  Hearing/Vision screen Hearing Screening - Comments:: Patient states having trouble with hearing Vision Screening - Comments:: Last exam 09/2021, Dr. Edison Pace, wears reading glasses  Dietary issues and exercise activities discussed: Current Exercise Habits: Home exercise routine, Time (Minutes): 45, Frequency (Times/Week): 4, Weekly Exercise (Minutes/Week): 180, Intensity: Moderate   Goals Addressed             This Visit's Progress    Patient Stated       Would like to maintain current health       Depression Screen PHQ 2/9 Scores 11/02/2021 10/20/2020 10/20/2019 10/15/2018 08/01/2018 10/09/2017 05/21/2017  PHQ - 2 Score 0 0 0 0 0 0 0  PHQ- 9 Score - 0 0 0 - 0 -    Fall Risk Fall Risk  11/02/2021 10/20/2020 10/20/2019 10/15/2018 10/09/2017  Falls in the past year? 0 0 0 0 No  Number falls in past yr: 0 0 0 - -  Comment - - - - -  Injury with Fall? 0 0 0 - -  Comment - - - - -  Risk for fall due to : No Fall Risks Medication side effect Medication side effect - -  Follow up Falls prevention discussed Falls evaluation completed;Falls prevention discussed Falls evaluation completed;Falls prevention discussed - -    FALL RISK PREVENTION PERTAINING TO THE HOME:  Any stairs in or around the home? Yes  If so, are there any without handrails? No  Home free of loose throw rugs in walkways, pet beds, electrical cords, etc? Yes  Adequate lighting in your home to reduce risk of falls? Yes   ASSISTIVE DEVICES UTILIZED TO PREVENT FALLS:  Life alert? No  Use of a cane, walker or w/c? Yes , cane Grab bars in the bathroom? Yes  Shower chair or bench in shower? Yes  Elevated toilet seat or a handicapped toilet? No  TIMED UP AND GO:  Was the test performed? No , visit completed over the phone.    Cognitive Function: Normal cognitive status assessed by  this Nurse Health Advisor. No abnormalities found.   MMSE - Mini Mental State Exam  10/20/2020 10/20/2019 10/15/2018 10/09/2017  Orientation to time 5 4 5 5   Orientation to Place 5 5 5 5   Registration 3 3 3 3   Attention/ Calculation 5 5 0 0  Recall 3 3 3 3   Language- name 2 objects - - 0 0  Language- repeat 1 1 1 1   Language- follow 3 step command - - 3 3  Language- read & follow direction - - 0 0  Write a sentence - - 0 0  Copy design - - 0 0  Total score - - 20 20        Immunizations Immunization History  Administered Date(s) Administered   Fluad Quad(high Dose 65+) 09/09/2019, 09/07/2020   Influenza,inj,Quad PF,6+ Mos 09/11/2014, 09/24/2015, 09/20/2016, 09/14/2017, 08/29/2018   Influenza-Unspecified 09/07/2021   PFIZER(Purple Top)SARS-COV-2 Vaccination 12/20/2019, 01/10/2020, 09/05/2020   Pneumococcal Conjugate-13 09/11/2014   Pneumococcal Polysaccharide-23 12/04/2009    TDAP status: Due, Education has been provided regarding the importance of this vaccine. Advised may receive this vaccine at local pharmacy or Health Dept. Aware to provide a copy of the vaccination record if obtained from local pharmacy or Health Dept. Verbalized acceptance and understanding.  Flu Vaccine status: Up to date  Pneumococcal vaccine status: Up to date  Covid-19 vaccine status: Completed vaccines  Qualifies for Shingles Vaccine? Yes   Zostavax completed No   Shingrix Completed?: No.    Education has been provided regarding the importance of this vaccine. Patient has been advised to call insurance company to determine out of pocket expense if they have not yet received this vaccine. Advised may also receive vaccine at local pharmacy or Health Dept. Verbalized acceptance and understanding.  Screening Tests Health Maintenance  Topic Date Due   Zoster Vaccines- Shingrix (1 of 2) Never done   COLONOSCOPY (Pts 45-74yrs Insurance coverage will need to be confirmed)  10/31/2019   TETANUS/TDAP  10/20/2024 (Originally 05/31/1960)   Pneumonia Vaccine 39+ Years old  Completed    INFLUENZA VACCINE  Completed   COVID-19 Vaccine  Completed   HPV VACCINES  Aged Out    Health Maintenance  Health Maintenance Due  Topic Date Due   Zoster Vaccines- Shingrix (1 of 2) Never done   COLONOSCOPY (Pts 45-18yrs Insurance coverage will need to be confirmed)  10/31/2019    Colorectal cancer screening: No longer required.   Lung Cancer Screening: (Low Dose CT Chest recommended if Age 37-80 years, 30 pack-year currently smoking OR have quit w/in 15years.) does qualify. , Patient has had CT scan of the chest on 09/30/21 and a PET scan done on 10/20/21    Additional Screening:  Hepatitis C Screening: does not qualify  Vision Screening: Recommended annual ophthalmology exams for early detection of glaucoma and other disorders of the eye. Is the patient up to date with their annual eye exam?  Yes  Who is the provider or what is the name of the office in which the patient attends annual eye exams? Dr. Edison Pace   Dental Screening: Recommended annual dental exams for proper oral hygiene  Community Resource Referral / Chronic Care Management: CRR required this visit?  No   CCM required this visit?  No      Plan:     I have personally reviewed  and noted the following in the patient's chart:   Medical and social history Use of alcohol, tobacco or illicit drugs  Current medications and supplements including opioid prescriptions. Patient is not currently taking opioid prescriptions. Functional ability and status Nutritional status Physical activity Advanced directives List of other physicians Hospitalizations, surgeries, and ER visits in previous 12 months Vitals Screenings to include cognitive, depression, and falls Referrals and appointments  In addition, I have reviewed and discussed with patient certain preventive protocols, quality metrics, and best practice recommendations. A written personalized care plan for preventive services as well as general preventive  health recommendations were provided to patient.   Due to this being a telephonic visit, the after visit summary with patients personalized plan was offered to patient via mail or my-chart. Patient preferred to pick up at office at next visit.   Loma Messing, LPN   [id number]   Nurse Health Advisor  Nurse Notes: none

## 2021-10-31 NOTE — Telephone Encounter (Signed)
Pt scheduled a awv/cpe/lab

## 2021-11-01 ENCOUNTER — Other Ambulatory Visit: Payer: Self-pay | Admitting: Family Medicine

## 2021-11-01 ENCOUNTER — Telehealth: Payer: Self-pay

## 2021-11-01 NOTE — Telephone Encounter (Signed)
Plz r/s [name].  Dr. Darnell Level prefers not to have a physical at the end of the day, especially if pt has not already had labs or wellness visit.

## 2021-11-02 ENCOUNTER — Encounter: Payer: PPO | Admitting: Family Medicine

## 2021-11-02 ENCOUNTER — Ambulatory Visit (INDEPENDENT_AMBULATORY_CARE_PROVIDER_SITE_OTHER): Payer: PPO

## 2021-11-02 VITALS — Ht 69.0 in | Wt 150.0 lb

## 2021-11-02 DIAGNOSIS — Z0000 Encounter for general adult medical examination without abnormal findings: Secondary | ICD-10-CM

## 2021-11-02 NOTE — Telephone Encounter (Signed)
ERx 

## 2021-11-02 NOTE — Patient Instructions (Signed)
Parker Martinez , Thank you for taking time to complete your Medicare Wellness Visit. I appreciate your ongoing commitment to your health goals. Please review the following plan we discussed and let me know if I can assist you in the future.   Screening recommendations/referrals: Colonoscopy: no longer required Recommended yearly ophthalmology/optometry visit for glaucoma screening and checkup Recommended yearly dental visit for hygiene and checkup  Vaccinations: Influenza vaccine: up to date Pneumococcal vaccine: up to date Tdap vaccine: Due-May obtain vaccine at your local pharmacy. Shingles vaccine: Discuss with your local pharmacy Covid-19:  up to date  Advanced directives: Please bring a copy of Living Will and/or Loop for your chart.   Conditions/risks identified: see problem list  Next appointment: Follow up in one year for your annual wellness visit.   Preventive Care 81 Years and Older, Male Preventive care refers to lifestyle choices and visits with your health care provider that can promote health and wellness. What does preventive care include? A yearly physical exam. This is also called an annual well check. Dental exams once or twice a year. Routine eye exams. Ask your health care provider how often you should have your eyes checked. Personal lifestyle choices, including: Daily care of your teeth and gums. Regular physical activity. Eating a healthy diet. Avoiding tobacco and drug use. Limiting alcohol use. Practicing safe sex. Taking low doses of aspirin every day. Taking vitamin and mineral supplements as recommended by your health care provider. What happens during an annual well check? The services and screenings done by your health care provider during your annual well check will depend on your age, overall health, lifestyle risk factors, and family history of disease. Counseling  Your health care provider may ask you questions about  your: Alcohol use. Tobacco use. Drug use. Emotional well-being. Home and relationship well-being. Sexual activity. Eating habits. History of falls. Memory and ability to understand (cognition). Work and work Statistician. Screening  You may have the following tests or measurements: Height, weight, and BMI. Blood pressure. Lipid and cholesterol levels. These may be checked every 5 years, or more frequently if you are over 81 years old. Skin check. Lung cancer screening. You may have this screening every year starting at age 22 if you have a 30-pack-year history of smoking and currently smoke or have quit within the past 15 years. Fecal occult blood test (FOBT) of the stool. You may have this test every year starting at age 10. Flexible sigmoidoscopy or colonoscopy. You may have a sigmoidoscopy every 5 years or a colonoscopy every 10 years starting at age 81. Prostate cancer screening. Recommendations will vary depending on your family history and other risks. Hepatitis C blood test. Hepatitis B blood test. Sexually transmitted disease (STD) testing. Diabetes screening. This is done by checking your blood sugar (glucose) after you have not eaten for a while (fasting). You may have this done every 1-3 years. Abdominal aortic aneurysm (AAA) screening. You may need this if you are a current or former smoker. Osteoporosis. You may be screened starting at age 81 if you are at high risk. Talk with your health care provider about your test results, treatment options, and if necessary, the need for more tests. Vaccines  Your health care provider may recommend certain vaccines, such as: Influenza vaccine. This is recommended every year. Tetanus, diphtheria, and acellular pertussis (Tdap, Td) vaccine. You may need a Td booster every 10 years. Zoster vaccine. You may need this after age 81. Pneumococcal 13-valent  conjugate (PCV13) vaccine. One dose is recommended after age 81. Pneumococcal  polysaccharide (PPSV23) vaccine. One dose is recommended after age 81. Talk to your health care provider about which screenings and vaccines you need and how often you need them. This information is not intended to replace advice given to you by your health care provider. Make sure you discuss any questions you have with your health care provider. Document Released: 12/17/2015 Document Revised: 08/09/2016 Document Reviewed: 09/21/2015 Elsevier Interactive Patient Education  2017 [place] Prevention in the Home Falls can cause injuries. They can happen to people of all ages. There are many things you can do to make your home safe and to help prevent falls. What can I do on the outside of my home? Regularly fix the edges of walkways and driveways and fix any cracks. Remove anything that might make you trip as you walk through a door, such as a raised step or threshold. Trim any bushes or trees on the path to your home. Use bright outdoor lighting. Clear any walking paths of anything that might make someone trip, such as rocks or tools. Regularly check to see if handrails are loose or broken. Make sure that both sides of any steps have handrails. Any raised decks and porches should have guardrails on the edges. Have any leaves, snow, or ice cleared regularly. Use sand or salt on walking paths during winter. Clean up any spills in your garage right away. This includes oil or grease spills. What can I do in the bathroom? Use night lights. Install grab bars by the toilet and in the tub and shower. Do not use towel bars as grab bars. Use non-skid mats or decals in the tub or shower. If you need to sit down in the shower, use a plastic, non-slip stool. Keep the floor dry. Clean up any water that spills on the floor as soon as it happens. Remove soap buildup in the tub or shower regularly. Attach bath mats securely with double-sided non-slip rug tape. Do not have throw rugs and other  things on the floor that can make you trip. What can I do in the bedroom? Use night lights. Make sure that you have a light by your bed that is easy to reach. Do not use any sheets or blankets that are too big for your bed. They should not hang down onto the floor. Have a firm chair that has side arms. You can use this for support while you get dressed. Do not have throw rugs and other things on the floor that can make you trip. What can I do in the kitchen? Clean up any spills right away. Avoid walking on wet floors. Keep items that you use a lot in easy-to-reach places. If you need to reach something above you, use a strong step stool that has a grab bar. Keep electrical cords out of the way. Do not use floor polish or wax that makes floors slippery. If you must use wax, use non-skid floor wax. Do not have throw rugs and other things on the floor that can make you trip. What can I do with my stairs? Do not leave any items on the stairs. Make sure that there are handrails on both sides of the stairs and use them. Fix handrails that are broken or loose. Make sure that handrails are as long as the stairways. Check any carpeting to make sure that it is firmly attached to the stairs. Fix any carpet that  is loose or worn. Avoid having throw rugs at the top or bottom of the stairs. If you do have throw rugs, attach them to the floor with carpet tape. Make sure that you have a light switch at the top of the stairs and the bottom of the stairs. If you do not have them, ask someone to add them for you. What else can I do to help prevent falls? Wear shoes that: Do not have high heels. Have rubber bottoms. Are comfortable and fit you well. Are closed at the toe. Do not wear sandals. If you use a stepladder: Make sure that it is fully opened. Do not climb a closed stepladder. Make sure that both sides of the stepladder are locked into place. Ask someone to hold it for you, if possible. Clearly  mark and make sure that you can see: Any grab bars or handrails. First and last steps. Where the edge of each step is. Use tools that help you move around (mobility aids) if they are needed. These include: Canes. Walkers. Scooters. Crutches. Turn on the lights when you go into a dark area. Replace any light bulbs as soon as they burn out. Set up your furniture so you have a clear path. Avoid moving your furniture around. If any of your floors are uneven, fix them. If there are any pets around you, be aware of where they are. Review your medicines with your doctor. Some medicines can make you feel dizzy. This can increase your chance of falling. Ask your doctor what other things that you can do to help prevent falls. This information is not intended to replace advice given to you by your health care provider. Make sure you discuss any questions you have with your health care provider. Document Released: 09/16/2009 Document Revised: 04/27/2016 Document Reviewed: 12/25/2014 Elsevier Interactive Patient Education  2017 [employer].

## 2021-11-08 ENCOUNTER — Other Ambulatory Visit: Payer: Self-pay

## 2021-11-08 ENCOUNTER — Encounter: Payer: Self-pay | Admitting: Family Medicine

## 2021-11-08 ENCOUNTER — Ambulatory Visit (INDEPENDENT_AMBULATORY_CARE_PROVIDER_SITE_OTHER): Payer: PPO | Admitting: Family Medicine

## 2021-11-08 VITALS — BP 132/72 | HR 120 | Temp 97.7°F | Ht 68.0 in | Wt 152.3 lb

## 2021-11-08 DIAGNOSIS — Z515 Encounter for palliative care: Secondary | ICD-10-CM | POA: Insufficient documentation

## 2021-11-08 DIAGNOSIS — I700 Atherosclerosis of aorta: Secondary | ICD-10-CM

## 2021-11-08 DIAGNOSIS — H9193 Unspecified hearing loss, bilateral: Secondary | ICD-10-CM

## 2021-11-08 DIAGNOSIS — J9611 Chronic respiratory failure with hypoxia: Secondary | ICD-10-CM

## 2021-11-08 DIAGNOSIS — Z7189 Other specified counseling: Secondary | ICD-10-CM | POA: Diagnosis not present

## 2021-11-08 DIAGNOSIS — C61 Malignant neoplasm of prostate: Secondary | ICD-10-CM

## 2021-11-08 DIAGNOSIS — K222 Esophageal obstruction: Secondary | ICD-10-CM

## 2021-11-08 DIAGNOSIS — J449 Chronic obstructive pulmonary disease, unspecified: Secondary | ICD-10-CM

## 2021-11-08 DIAGNOSIS — I708 Atherosclerosis of other arteries: Secondary | ICD-10-CM

## 2021-11-08 DIAGNOSIS — E559 Vitamin D deficiency, unspecified: Secondary | ICD-10-CM

## 2021-11-08 DIAGNOSIS — E440 Moderate protein-calorie malnutrition: Secondary | ICD-10-CM

## 2021-11-08 DIAGNOSIS — I6523 Occlusion and stenosis of bilateral carotid arteries: Secondary | ICD-10-CM

## 2021-11-08 DIAGNOSIS — K219 Gastro-esophageal reflux disease without esophagitis: Secondary | ICD-10-CM

## 2021-11-08 DIAGNOSIS — E785 Hyperlipidemia, unspecified: Secondary | ICD-10-CM | POA: Diagnosis not present

## 2021-11-08 DIAGNOSIS — F17200 Nicotine dependence, unspecified, uncomplicated: Secondary | ICD-10-CM | POA: Diagnosis not present

## 2021-11-08 DIAGNOSIS — I739 Peripheral vascular disease, unspecified: Secondary | ICD-10-CM

## 2021-11-08 DIAGNOSIS — Z0000 Encounter for general adult medical examination without abnormal findings: Secondary | ICD-10-CM

## 2021-11-08 DIAGNOSIS — I70213 Atherosclerosis of native arteries of extremities with intermittent claudication, bilateral legs: Secondary | ICD-10-CM

## 2021-11-08 DIAGNOSIS — I10 Essential (primary) hypertension: Secondary | ICD-10-CM | POA: Diagnosis not present

## 2021-11-08 DIAGNOSIS — M5136 Other intervertebral disc degeneration, lumbar region: Secondary | ICD-10-CM

## 2021-11-08 LAB — COMPREHENSIVE METABOLIC PANEL
ALT: 29 U/L (ref 0–53)
AST: 24 U/L (ref 0–37)
Albumin: 4.2 g/dL (ref 3.5–5.2)
Alkaline Phosphatase: 60 U/L (ref 39–117)
BUN: 16 mg/dL (ref 6–23)
CO2: 29 mEq/L (ref 19–32)
Calcium: 9.8 mg/dL (ref 8.4–10.5)
Chloride: 102 mEq/L (ref 96–112)
Creatinine, Ser: 0.86 mg/dL (ref 0.40–1.50)
GFR: 81.24 mL/min (ref >60.00)
Glucose, Bld: 110 mg/dL — ABNORMAL HIGH (ref 70–99)
Potassium: 4.5 mEq/L (ref 3.5–5.1)
Sodium: 140 mEq/L (ref 135–145)
Total Bilirubin: 0.7 mg/dL (ref 0.2–1.2)
Total Protein: 6.5 g/dL (ref 6.0–8.3)

## 2021-11-08 LAB — CBC WITH DIFFERENTIAL/PLATELET
Basophils Absolute: 0 10*3/uL (ref 0.0–0.1)
Basophils Relative: 0.2 % (ref 0.0–3.0)
Eosinophils Absolute: 0 10*3/uL (ref 0.0–0.7)
Eosinophils Relative: 0.2 % (ref 0.0–5.0)
HCT: 41.8 % (ref 39.0–52.0)
Hemoglobin: 13.7 g/dL (ref 13.0–17.0)
Lymphocytes Relative: 12.2 % (ref 12.0–46.0)
Lymphs Abs: 1.3 10*3/uL (ref 0.7–4.0)
MCHC: 32.7 g/dL (ref 30.0–36.0)
MCV: 100.6 fl — ABNORMAL HIGH (ref 78.0–100.0)
Monocytes Absolute: 0.6 10*3/uL (ref 0.1–1.0)
Monocytes Relative: 5.3 % (ref 3.0–12.0)
Neutro Abs: 8.9 10*3/uL — ABNORMAL HIGH (ref 1.4–7.7)
Neutrophils Relative %: 82.1 % — ABNORMAL HIGH (ref 43.0–77.0)
Platelets: 218 10*3/uL (ref 150.0–400.0)
RBC: 4.16 Mil/uL — ABNORMAL LOW (ref 4.22–5.81)
RDW: 12.8 % (ref 11.5–15.5)
WBC: 10.9 10*3/uL — ABNORMAL HIGH (ref 4.0–10.5)

## 2021-11-08 LAB — LIPID PANEL
Cholesterol: 217 mg/dL — ABNORMAL HIGH (ref 0–200)
HDL: 148.6 mg/dL (ref >39.00)
LDL Cholesterol: 56 mg/dL (ref 0–99)
NonHDL: 68.47
Total CHOL/HDL Ratio: 1
Triglycerides: 60 mg/dL (ref 0.0–149.0)
VLDL: 12 mg/dL (ref 0.0–40.0)

## 2021-11-08 LAB — VITAMIN D 25 HYDROXY (VIT D DEFICIENCY, FRACTURES): VITD: 37.49 ng/mL (ref 30.00–100.00)

## 2021-11-08 MED ORDER — PANTOPRAZOLE SODIUM 40 MG PO TBEC: 40.0000 mg | DELAYED_RELEASE_TABLET | Freq: Two times a day (BID) | ORAL | 3 refills | Status: DC

## 2021-11-08 MED ORDER — GUAIFENESIN ER 600 MG PO TB12: 600.0000 mg | ORAL_TABLET | Freq: Two times a day (BID) | ORAL | Status: DC | PRN

## 2021-11-08 NOTE — Assessment & Plan Note (Signed)
Preventative protocols reviewed and updated unless pt declined. Discussed healthy diet and lifestyle.  

## 2021-11-08 NOTE — Assessment & Plan Note (Signed)
Will be due for rpt carotid US 07/2022

## 2021-11-08 NOTE — Assessment & Plan Note (Signed)
Chronic, severe, COPD related. Continue nocturnal o2 supplementation.

## 2021-11-08 NOTE — Assessment & Plan Note (Signed)
Hard of hearing, doesn't have aides. Declines audiology eval.

## 2021-11-08 NOTE — Assessment & Plan Note (Signed)
Known PAD

## 2021-11-08 NOTE — Assessment & Plan Note (Signed)
Advanced directives: has living will at home, scanned into chart (09/2014). Wife is Ladean Raya. "No extraordinary efforts" does not want feeding tube. Receives palliative care through [facility].

## 2021-11-08 NOTE — Patient Instructions (Addendum)
Labs today  Change mucinex DM to plain mucinex.  Good to see you today Return as needed or in 6 months for follow up visit.   Health Maintenance After Age 81 After age 67, you are at a higher risk for certain long-term diseases and infections as well as injuries from falls. Falls are a major cause of broken bones and head injuries in people who are older than age 38. Getting regular preventive care can help to keep you healthy and well. Preventive care includes getting regular testing and making lifestyle changes as recommended by your health care provider. Talk with your health care provider about: Which screenings and tests you should have. A screening is a test that checks for a disease when you have no symptoms. A diet and exercise plan that is right for you. What should I know about screenings and tests to prevent falls? Screening and testing are the best ways to find a health problem early. Early diagnosis and treatment give you the best chance of managing medical conditions that are common after age 70. Certain conditions and lifestyle choices may make you more likely to have a fall. Your health care provider may recommend: Regular vision checks. Poor vision and conditions such as cataracts can make you more likely to have a fall. If you wear glasses, make sure to get your prescription updated if your vision changes. Medicine review. Work with your health care provider to regularly review all of the medicines you are taking, including over-the-counter medicines. Ask your health care provider about any side effects that may make you more likely to have a fall. Tell your health care provider if any medicines that you take make you feel dizzy or sleepy. Strength and balance checks. Your health care provider may recommend certain tests to check your strength and balance while standing, walking, or changing positions. Foot health exam. Foot pain and numbness, as well as not wearing proper footwear,  can make you more likely to have a fall. Screenings, including: Osteoporosis screening. Osteoporosis is a condition that causes the bones to get weaker and break more easily. Blood pressure screening. Blood pressure changes and medicines to control blood pressure can make you feel dizzy. Depression screening. You may be more likely to have a fall if you have a fear of falling, feel depressed, or feel unable to do activities that you used to do. Alcohol use screening. Using too much alcohol can affect your balance and may make you more likely to have a fall. Follow these instructions at home: Lifestyle Do not drink alcohol if: Your health care provider tells you not to drink. If you drink alcohol: Limit how much you have to: 0-1 drink a day for women. 0-2 drinks a day for men. Know how much alcohol is in your drink. In the [place] one drink equals one 12 oz bottle of beer (355 mL), one 5 oz glass of wine (148 mL), or one 1 oz glass of hard liquor (44 mL). Do not use any products that contain nicotine or tobacco. These products include cigarettes, chewing tobacco, and vaping devices, such as e-cigarettes. If you need help quitting, ask your health care provider. Activity  Follow a regular exercise program to stay fit. This will help you maintain your balance. Ask your health care provider what types of exercise are appropriate for you. If you need a cane or walker, use it as recommended by your health care provider. Wear supportive shoes that have nonskid soles. Safety  Remove any tripping hazards, such as rugs, cords, and clutter. Install safety equipment such as grab bars in bathrooms and safety rails on stairs. Keep rooms and walkways well-lit. General instructions Talk with your health care provider about your risks for falling. Tell your health care provider if: You fall. Be sure to tell your health care provider about all falls, even ones that seem minor. You feel dizzy, tiredness  (fatigue), or off-balance. Take over-the-counter and prescription medicines only as told by your health care provider. These include supplements. Eat a healthy diet and maintain a healthy weight. A healthy diet includes low-fat dairy products, low-fat (lean) meats, and fiber from whole grains, beans, and lots of fruits and vegetables. Stay current with your vaccines. Schedule regular health, dental, and eye exams. Summary Having a healthy lifestyle and getting preventive care can help to protect your health and wellness after age 9. Screening and testing are the best way to find a health problem early and help you avoid having a fall. Early diagnosis and treatment give you the best chance for managing medical conditions that are more common for people who are older than age 1. Falls are a major cause of broken bones and head injuries in people who are older than age 54. Take precautions to prevent a fall at home. Work with your health care provider to learn what changes you can make to improve your health and wellness and to prevent falls. This information is not intended to replace advice given to you by your health care provider. Make sure you discuss any questions you have with your health care provider. Document Revised: 04/11/2021 Document Reviewed: 04/11/2021 Elsevier Patient Education  [place].

## 2021-11-08 NOTE — Progress Notes (Signed)
Patient ID: ULMER DEGEN, male    DOB: 11/20/1941, 80 y.o.   MRN: [id number]  This visit was conducted in person.  BP 132/72   Pulse (!) 120   Temp 97.7 F (36.5 C) (Temporal)   Ht 5\' 8"  (1.727 m)   Wt 152 lb 5 oz (69.1 kg)   SpO2 96%   BMI 23.16 kg/m    CC: CPE Subjective:   HPI: Parker Martinez is a 80 y.o. male presenting on 11/08/2021 for Annual Exam [facility] prt 2.  Pt seems to be having COPD exacerbation.  States his breathing is like that 24 hrs a day.)   Saw health advisor last week for medicare wellness visit. Note reviewed.    No results found.  Flowsheet Row Clinical Support from 11/02/2021 in [place] [facility] [place]  PHQ-2 Total Score 0       Fall Risk  11/02/2021 10/20/2020 10/20/2019 10/15/2018 10/09/2017  Falls in the past year? 0 0 0 0 No  Number falls in past yr: 0 0 0 - -  Comment - - - - -  Injury with Fall? 0 0 0 - -  Comment - - - - -  Risk for fall due to : No Fall Risks Medication side effect Medication side effect - -  Follow up Falls prevention discussed Falls evaluation completed;Falls prevention discussed Falls evaluation completed;Falls prevention discussed - -   Tachycardia attributed to recent albuterol use. Requests PPI refill (takes BID)  Severe COPD continued smoker followed by pulmonology only on nocturnal O2 supplementation with chronic debilitating dyspnea. Takes prednisone 10mg  daily. Continues Breztri regularly with spacer. Also recommended regular albuterol neb use with Breztri to improve medicine delivery to lungs.  Recent PET scan showing likely scarring - rec rpt CT scan in 3 months.  He notes bedroom is upstairs which can be a problem.   R foot drop symptoms have improved   Preventative: COLONOSCOPY 10/2016 - severe diverticulosis, no f/u recommended [name]) Prostate cancer - prostate seeds 2016 (Dr. [name] at [facility]). Normal recent CT scan. Sees urologist Dr [name] q6 mo.  Lung cancer screening - remotely  completed at [facility] - see above.  Flu shot yearly COVID vaccine - PFizer 12/2019, 01/2020, booster 09/2020, bivalent booster 09/2021 Tetanus - unsure Pneumonia 2011, prevnar-13 2015  Shingrix - discussed  Advanced directives: has living will at home, scanned into chart (09/2014). Wife is Ladean Raya. "No extraordinary efforts" does not want feeding tube. Receives palliative care through [facility].  Seat belt use discussed  Sunscreen use discussed. No changing moles on skin.  Smoker a few cigarettes a day intermittently.  Alcohol - 1-2 drinks/day - likes scotch  Dentist due but finances limit ability to seek care. 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 Interim medical history since our last visit reviewed. Allergies and medications reviewed and updated. Outpatient Medications Prior to Visit  Medication Sig Dispense Refill   acetaminophen (TYLENOL) 325 MG tablet Take 1 tablet (325 mg total) by mouth every 6 (six) hours as needed for mild pain, fever or  headache (or Fever >/= 101).     albuterol (PROVENTIL) (2.5 MG/3ML) 0.083% nebulizer solution Take 3 mLs (2.5 mg total) by nebulization every 4 (four) hours as needed for wheezing or shortness of breath. 360 mL 2   albuterol (VENTOLIN HFA) 108 (90 Base) MCG/ACT inhaler USE 2 PUFFS EVERY 6 HOURS AS NEEDED FOR WHEEZING 18 each 2   alfuzosin (UROXATRAL) 10 MG 24 hr tablet Take 10 mg by mouth daily with breakfast.     aspirin 81 MG tablet Take 81 mg by mouth every morning.     atorvastatin (LIPITOR) 40 MG tablet TAKE 1 TABLET BY  MOUTH EVERY DAY 90 tablet 3   BREZTRI AEROSPHERE 160-9-4.8 MCG/ACT AERO INHALE 2 PUFFS BY MOUTH TWICE A DAY 10.7 g 11   EPINEPHrine (EPIPEN 2-PAK) 0.3 mg/0.3 mL IJ SOAJ injection Inject 0.3 mLs (0.3 mg total) into the muscle as needed for anaphylaxis. 1 each 1   fexofenadine (ALLEGRA) 180 MG tablet Take 180 mg by mouth daily.     ipratropium (ATROVENT) 0.06 % nasal spray Place 2 sprays into both nostrils 4 (four) times daily as needed for rhinitis. 15 mL 3   montelukast (SINGULAIR) 10 MG tablet TAKE 1 TABLET BY MOUTH EVERYDAY AT BEDTIME 90 tablet 3   Multiple Vitamin (MULTIVITAMIN) tablet Take 1 tablet by mouth daily.     predniSONE (DELTASONE) 10 MG tablet TAKE 1 TABLET (10 MG TOTAL) BY MOUTH DAILY WITH BREAKFAST. 30 tablet 6   traMADol (ULTRAM) 50 MG tablet TAKE 1/2 TO 1 TAB BY MOUTH TWICE A DAY AS NEEDED FOR MODERATE PAIN FOR UP TO 5 DAYS 14 tablet 1   vitamin B-12 (CYANOCOBALAMIN) 500 MCG tablet Take 500 mcg by mouth every morning.      dextromethorphan-guaiFENesin (MUCINEX DM) 30-600 MG 12hr tablet Take 1 tablet by mouth 2 (two) times daily.     pantoprazole (PROTONIX) 40 MG tablet TAKE 1 TABLET (40 MG TOTAL) BY MOUTH 2 (TWO) TIMES DAILY BEFORE A MEAL. PLEASE CALL TO SCHEDULE AN OFFICE VISIT FOR FURTHER REFILLS. THANK YOU 180 tablet 0   No facility-administered medications prior to visit.     Per HPI unless specifically indicated in ROS section below Review of Systems  Constitutional:  Negative for activity change, appetite change, chills, fatigue, fever and unexpected weight change.  HENT:  Negative for hearing loss.   Eyes:  Negative for visual disturbance.  Respiratory:  Positive for cough, chest tightness, shortness of breath and wheezing.   Cardiovascular:  Positive for leg swelling. Negative for chest pain and palpitations.  Gastrointestinal:  Positive for diarrhea (occ). Negative for abdominal distention, abdominal pain, blood in stool, constipation, nausea and vomiting.   Genitourinary:  Negative for difficulty urinating and hematuria.  Musculoskeletal:  Positive for arthralgias and back pain. Negative for myalgias and neck pain.  Skin:  Negative for rash.  Neurological:  Negative for dizziness, seizures, syncope and headaches.  Hematological:  Negative for adenopathy. Does not bruise/bleed easily.  Psychiatric/Behavioral:  Negative for dysphoric mood. The patient is not nervous/anxious.    Objective:  BP 132/72   Pulse (!) 120   Temp 97.7 F (36.5 C) (Temporal)   Ht 5\' 8"  (1.727 m)   Wt 152 lb 5 oz (69.1 kg)   SpO2 96%   BMI 23.16 kg/m   Wt Readings from Last 3 Encounters:  11/08/21 152 lb 5 oz (69.1 kg)  11/02/21 150 lb (68 kg)  09/12/21 150 lb 6.4 oz (68.2 kg)  Physical Exam Vitals and nursing note reviewed.  Constitutional:      General: He is not in acute distress.    Appearance: Normal appearance. He is well-developed. He is ill-appearing (chronic).  HENT:     Head: Normocephalic and atraumatic.     Right Ear: Hearing, tympanic membrane, ear canal and external ear normal.     Left Ear: Hearing, tympanic membrane, ear canal and external ear normal.  Eyes:     General: No scleral icterus.    Extraocular Movements: Extraocular movements intact.     Conjunctiva/sclera: Conjunctivae normal.     Pupils: Pupils are equal, round, and reactive to light.  Neck:     Thyroid: No thyroid mass or thyromegaly.  Cardiovascular:     Rate and Rhythm: Regular rhythm. Tachycardia present.     Pulses: Normal pulses.          Radial pulses are 2+ on the right side and 2+ on the left side.     Heart sounds: Normal heart sounds. No murmur heard. Pulmonary:     Effort: No respiratory distress.     Breath sounds: Wheezing and rhonchi present. No rales.     Comments: Coarse rhonchi throughout with increased work of breathing, chronic Abdominal:     General: Bowel sounds are normal. There is no distension.     Palpations: Abdomen is soft. There is no  mass.     Tenderness: There is no abdominal tenderness. There is no guarding or rebound.     Hernia: No hernia is present.  Musculoskeletal:        General: Normal range of motion.     Cervical back: Normal range of motion and neck supple.     Right lower leg: No edema.     Left lower leg: No edema.  Lymphadenopathy:     Cervical: No cervical adenopathy.  Skin:    General: Skin is warm and dry.     Findings: No rash.  Neurological:     General: No focal deficit present.     Mental Status: He is alert and oriented to person, place, and time.  Psychiatric:        Mood and Affect: Mood normal.        Behavior: Behavior normal.        Thought Content: Thought content normal.        Judgment: Judgment normal.      Results for orders placed or performed during the hospital encounter of 10/20/21  Glucose, capillary  Result Value Ref Range   Glucose-Capillary 83 70 - 99 mg/dL    Assessment & Plan:  This visit occurred during the SARS-CoV-2 public health emergency.  Safety protocols were in place, including screening questions prior to the visit, additional usage of staff PPE, and extensive cleaning of exam room while observing appropriate contact time as indicated for disinfecting solutions.   Problem List Items Addressed This Visit     Healthcare maintenance - Primary (Chronic)    Preventative protocols reviewed and updated unless pt declined. Discussed healthy diet and lifestyle.       Advanced care planning/counseling discussion (Chronic)    Advanced directives: has living will at home, scanned into chart (09/2014). Wife is Ladean Raya. "No extraordinary efforts" does not want feeding tube. Receives [facility] care through [facility].       Palliative care status (Chronic)    Receives palliative care through [facility] - with home visits Q3 months.  Esophageal rings - lower esophagus    Possible recurrent esoph stricture given notes some dysphagia however likely poor  candidate for any sedation. Continue PPI BID.       Smoker    Continued occasional smoker. Precontemplative.       COPD, severe ([place])    Appreciate pulm care.  Continue albuterol neb coupled with breztri use.  Continue daily prednisone and nocturnal O2.      Relevant Medications   guaiFENesin (MUCINEX) 600 MG 12 hr tablet   Prostate cancer ([place])    Followed by [facility]       Relevant Orders   CBC with Differential/Platelet   GERD (gastroesophageal reflux disease)    Continue PPI BID      Relevant Medications   pantoprazole (PROTONIX) 40 MG tablet   HTN (hypertension)    Chronic, continue to monitor off antihypertensive.       PVD (peripheral vascular disease) with claudication (HCC)   Dyslipidemia    Continue statin. Update FLP. The ASCVD Risk score (Arnett DK, et al., 2019) failed to calculate for the following reasons:   The 2019 ASCVD risk score is only valid for ages 54 to 46       Relevant Orders   Lipid panel   Comprehensive metabolic panel   DDD (degenerative disc disease), lumbar    Chronic, ongoing difficulty      Vitamin D deficiency    Update levels off replacement.      Relevant Orders   VITAMIN D 25 Hydroxy (Vit-D Deficiency, Fractures)   Carotid stenosis    Will be due for rpt carotid US 07/2022      Atherosclerosis of native arteries of extremity with intermittent claudication (HCC)    Known PAD      Bilateral hearing loss    Hard of hearing, doesn't have aides. Declines audiology eval.       Aorto-iliac atherosclerosis (HCC)    Continue aspirin, atorvastatin.        Protein-calorie malnutrition ([place])    Update protein levels      Chronic respiratory failure with hypoxia (HCC)    Chronic, severe, COPD related. Continue nocturnal o2 supplementation.         Meds ordered this encounter  Medications   pantoprazole (PROTONIX) 40 MG tablet    Sig: Take 1 tablet (40 mg total) by mouth 2 (two) times daily before a meal.     Dispense:  180 tablet    Refill:  3   guaiFENesin (MUCINEX) 600 MG 12 hr tablet    Sig: Take 1 tablet (600 mg total) by mouth 2 (two) times daily as needed.   Orders Placed This Encounter  Procedures   Lipid panel   Comprehensive metabolic panel   CBC with Differential/Platelet   VITAMIN D 25 Hydroxy (Vit-D Deficiency, Fractures)    Patient instructions: Labs today  Change mucinex DM to plain mucinex.  Good to see you today Return as needed or in 6 months for follow up visit.   Follow up plan: Return in about 6 months (around 05/09/2022) for follow up visit.  Ria Bush, MD

## 2021-11-08 NOTE — Assessment & Plan Note (Addendum)
Continue aspirin, atorvastatin.

## 2021-11-08 NOTE — Assessment & Plan Note (Signed)
Chronic, continue to monitor off antihypertensive.

## 2021-11-08 NOTE — Assessment & Plan Note (Signed)
Receives palliative care through [facility] - with home visits Q3 months.

## 2021-11-08 NOTE — Assessment & Plan Note (Signed)
Update protein levels. ?

## 2021-11-08 NOTE — Assessment & Plan Note (Addendum)
Possible recurrent esoph stricture given notes some dysphagia however likely poor candidate for any sedation. Continue PPI BID.

## 2021-11-08 NOTE — Assessment & Plan Note (Signed)
Update levels off replacement.

## 2021-11-08 NOTE — Assessment & Plan Note (Signed)
Continue PPI BID

## 2021-11-08 NOTE — Assessment & Plan Note (Signed)
Chronic, ongoing difficulty

## 2021-11-08 NOTE — Assessment & Plan Note (Signed)
Continued occasional smoker. Precontemplative.

## 2021-11-08 NOTE — Assessment & Plan Note (Signed)
Followed by [facility] urology

## 2021-11-08 NOTE — Assessment & Plan Note (Signed)
Continue statin. Update FLP. The ASCVD Risk score (Arnett DK, et al., 2019) failed to calculate for the following reasons:   The 2019 ASCVD risk score is only valid for ages 23 to 67

## 2021-11-08 NOTE — Assessment & Plan Note (Signed)
Appreciate pulm care.  Continue albuterol neb coupled with breztri use.  Continue daily prednisone and nocturnal O2.

## 2021-11-10 DIAGNOSIS — J449 Chronic obstructive pulmonary disease, unspecified: Secondary | ICD-10-CM | POA: Diagnosis not present

## 2021-11-10 DIAGNOSIS — J9610 Chronic respiratory failure, unspecified whether with hypoxia or hypercapnia: Secondary | ICD-10-CM | POA: Diagnosis not present

## 2021-11-10 DIAGNOSIS — Z515 Encounter for palliative care: Secondary | ICD-10-CM | POA: Diagnosis not present

## 2021-11-10 DIAGNOSIS — Z66 Do not resuscitate: Secondary | ICD-10-CM | POA: Diagnosis not present

## 2021-11-10 DIAGNOSIS — F17210 Nicotine dependence, cigarettes, uncomplicated: Secondary | ICD-10-CM | POA: Diagnosis not present

## 2021-11-10 DIAGNOSIS — Z9981 Dependence on supplemental oxygen: Secondary | ICD-10-CM | POA: Diagnosis not present

## 2021-11-10 DIAGNOSIS — I10 Essential (primary) hypertension: Secondary | ICD-10-CM | POA: Diagnosis not present

## 2021-11-17 DIAGNOSIS — R0600 Dyspnea, unspecified: Secondary | ICD-10-CM | POA: Diagnosis not present

## 2021-11-21 ENCOUNTER — Other Ambulatory Visit: Payer: Self-pay | Admitting: Family Medicine

## 2021-12-01 DIAGNOSIS — C61 Malignant neoplasm of prostate: Secondary | ICD-10-CM | POA: Diagnosis not present

## 2021-12-13 ENCOUNTER — Other Ambulatory Visit: Payer: Self-pay | Admitting: Pulmonary Disease

## 2021-12-13 ENCOUNTER — Ambulatory Visit: Payer: PPO | Admitting: Pulmonary Disease

## 2021-12-13 ENCOUNTER — Encounter: Payer: Self-pay | Admitting: Pulmonary Disease

## 2021-12-13 ENCOUNTER — Other Ambulatory Visit: Payer: Self-pay

## 2021-12-13 VITALS — BP 150/70 | HR 118 | Temp 97.3°F | Ht 68.0 in | Wt 150.4 lb

## 2021-12-13 DIAGNOSIS — Z7189 Other specified counseling: Secondary | ICD-10-CM | POA: Diagnosis not present

## 2021-12-13 DIAGNOSIS — R0609 Other forms of dyspnea: Secondary | ICD-10-CM

## 2021-12-13 DIAGNOSIS — J479 Bronchiectasis, uncomplicated: Secondary | ICD-10-CM

## 2021-12-13 DIAGNOSIS — J449 Chronic obstructive pulmonary disease, unspecified: Secondary | ICD-10-CM

## 2021-12-13 DIAGNOSIS — G4736 Sleep related hypoventilation in conditions classified elsewhere: Secondary | ICD-10-CM | POA: Diagnosis not present

## 2021-12-13 DIAGNOSIS — J439 Emphysema, unspecified: Secondary | ICD-10-CM

## 2021-12-13 DIAGNOSIS — F17210 Nicotine dependence, cigarettes, uncomplicated: Secondary | ICD-10-CM | POA: Diagnosis not present

## 2021-12-13 MED ORDER — ROFLUMILAST 250 MCG PO TABS: 1.0000 | ORAL_TABLET | Freq: Every day | ORAL | 1 refills | Status: DC

## 2021-12-13 NOTE — Patient Instructions (Addendum)
We have ordered a medication called Daliresp this has to be started at a low dose for 1 month and then we can increase it to the regular dose.  This should help you clear secretions.  You qualify for oxygen today.  We will put you on 1 L/min.  We will send the order to [facility].  We are getting you a vest to help you clear secretions this will be also ordered through [facility].  We will see you in follow-up in 4 to 6 weeks time you will see me or the nurse practitioner at that time call sooner should you have any new issues.

## 2021-12-13 NOTE — Progress Notes (Addendum)
Subjective:    Patient ID: Parker Martinez, male    DOB: 28-Mar-1941, 81 y.o.   MRN: [id number] Chief Complaint  Patient presents with   Follow-up   Requesting MD/Service: Ria Bush, MD Date of initial consultation: 05/06/18 by Dr. Merton Border Reason for consultation: Severe COPD, smoker   PT PROFILE: 81 y.o. male former smoker (previously > 1 PPD, with diagnosis of COPD rendered several years ago and with progressive DOE over past 5-7 years   DATA: 10/19/10 Spirometry: Severe obstruction (FEV1 41% predicted) 05/28/18 PFTs: FVC: 2.13 > 2.96 L (58 > 80 %pred), FEV1: 0.85 > 1.04 L (30 > 36 %pred), FEV1/FVC: 40 >35%, TLC: 7.90 L (128 %pred), DLCO 50 %pred.  Flow volume curve consistent with severe obstruction 02/20/19 PFTs: FEV1 was 1.96 L or 48% predicted, FVC was 2.72 L or 95% predicted, FEV1/FVC was 38%.  TLC  131%, RV 165%, indicating hyperinflation and air trapping.  Diffusion capacity was 48%.  Findings consistent with severe obstruction and emphysema. 05/05/20 Overnight oximetry: Shows nocturnal desaturations and possible obstructive sleep apnea.  Patient will need sleep study 06/01/20 2D echo: LVEF 60 to 65%, no evidence of cor pulmonale, mild aortic sclerosis, diastolic dysfunction type I. 06/17/2020 chest x-ray two-view: Chronic COPD changes, upper lobe parenchymal scarring, right upper lobe nodules, stable. 10/21/2020 overnight oximetry: Significant desaturations during the night oxygen desaturation index 24 oxygen desaturation events 188.  Low O2 sats 85%, patient will require oxygen at 2 L/min nocturnally. 07/27/2021 echocardiogram: LVEF 55%, no wall motion abnormalities, grade II DD, RV function normal.  No overt valvulopathy.  Since prior 2D echo diastolic dysfunction has worsened. 09/30/2021 CT chest: Emphysematous changes, multiple nodules, mucous retention, focal bronchiectatic changes on left.   HPI Parker Martinez is an 81 year old current smoker (5 to 6 cigarettes/day)  who presents for follow-up on very severe COPD with chronic debilitating dyspnea.  We last saw him on 12 September 2021.  He has nocturnal hypoxemia due to emphysema and uses oxygen at 2 L/min nocturnally.  He is compliant with the oxygen.  He notes that this alleviates his symptoms somewhat.  He continues to have issues with copious secretions that are very hard for him to expectorate.  He was placed on Acapella flutter valve on his last visit but this does not alleviate his symptoms completely.  He has been on [employer] and notes that that is a "Godsend" for him.  He is compliant with his inhaler therapy.  At a prior visit he was given a spacer and this has helped with the deposition on the medication.  He still has issues with very tenacious and copious secretions.  He states that he is still smoking because the cigarettes help him mobilize secretions.  He uses his nebulizer albuterol, only rarely.  Does note some relief of his secretions with a nebulizer.  He has not had any fevers, chills or sweats.  Sputum is whitish to grayish.  No other discoloration. No hemoptysis.  He has very severe debilitating dyspnea and qualifying him for oxygen with ambulation has been difficult.  Does not endorse any other complaint today.   Review of Systems A 10 point review of systems was performed and it is as noted above otherwise negative.  Patient Active Problem List   Diagnosis Date Noted   Palliative care status 11/08/2021   Pulmonary hypertension ([place]) 05/19/2021   Orthostatic dizziness 05/19/2021   Chronic respiratory failure with hypoxia ([place]) 02/25/2021   Protein-calorie malnutrition ([place]) 11/04/2020   Tachycardia  10/20/2020   Right foot drop 09/21/2020   Pedal edema 09/07/2020   Aorto-iliac atherosclerosis ([place]) 01/25/2019   Left lumbar radiculopathy 01/24/2019   Bilateral hearing loss 10/21/2018   BPV (benign positional vertigo), right 09/09/2018   Heart murmur 09/09/2018   Encounter for chronic  pain management 05/15/2017   Chronic pain in left shoulder 01/30/2017   Subclavian steal syndrome 10/12/2016   Atherosclerosis of native arteries of extremity with intermittent claudication ([place]) 10/21/2014   Medicare annual wellness visit, subsequent 09/11/2014   Carotid stenosis 09/11/2014   Advanced care planning/counseling discussion 09/11/2014   Vitamin D deficiency 09/01/2014   DDD (degenerative disc disease), lumbar    Personal history of colonic adenomas 09/02/2013   COPD exacerbation ([place]) 03/04/2013   Healthcare maintenance 04/25/2012   Polycythemia secondary to smoking 04/25/2012   Dyslipidemia 04/25/2012   HTN (hypertension)    PVD (peripheral vascular disease) with claudication (HCC)    COPD, severe (HCC)    Prostate cancer (HCC)    GERD (gastroesophageal reflux disease)    Idiopathic anaphylaxis    Smoker 09/13/2011   Esophageal rings - lower esophagus 06/04/2009   Social History   Tobacco Use   Smoking status: Every Day    Packs/day: 1.00    Years: 60.00    Pack years: 60.00    Types: Cigarettes   Smokeless tobacco: Never   Tobacco comments:    8-10 cigs daily--05/31/2021         Smoke occasionally 1 pack x week 09/12/21        5-6 cigs pd- 12/13/21  Substance Use Topics   Alcohol use: Yes    Alcohol/week: 14.0 standard drinks    Types: 14 Cans of beer per week    Comment: socially   Allergies  Allergen Reactions   Flonase [Fluticasone Propionate] Other (See Comments)    Per pt, causes rebound effect    Current Meds  Medication Sig   acetaminophen (TYLENOL) 325 MG tablet Take 1 tablet (325 mg total) by mouth every 6 (six) hours as needed for mild pain, fever or headache (or Fever >/= 101).   albuterol (PROVENTIL) (2.5 MG/3ML) 0.083% nebulizer solution Take 3 mLs (2.5 mg total) by nebulization every 4 (four) hours as needed for wheezing or shortness of breath.   albuterol (VENTOLIN HFA) 108 (90 Base) MCG/ACT inhaler USE 2 PUFFS EVERY 6 HOURS AS NEEDED  FOR WHEEZING   alfuzosin (UROXATRAL) 10 MG 24 hr tablet Take 10 mg by mouth daily with breakfast.   aspirin 81 MG tablet Take 81 mg by mouth every morning.   atorvastatin (LIPITOR) 40 MG tablet TAKE 1 TABLET BY MOUTH EVERY DAY   BREZTRI AEROSPHERE 160-9-4.8 MCG/ACT AERO INHALE 2 PUFFS BY MOUTH TWICE A DAY   EPINEPHrine (EPIPEN 2-PAK) 0.3 mg/0.3 mL IJ SOAJ injection Inject 0.3 mLs (0.3 mg total) into the muscle as needed for anaphylaxis.   fexofenadine (ALLEGRA) 180 MG tablet Take 180 mg by mouth daily.   guaiFENesin (MUCINEX) 600 MG 12 hr tablet Take 1 tablet (600 mg total) by mouth 2 (two) times daily as needed.   ipratropium (ATROVENT) 0.06 % nasal spray Place 2 sprays into both nostrils 4 (four) times daily as needed for rhinitis.   montelukast (SINGULAIR) 10 MG tablet TAKE 1 TABLET BY MOUTH EVERYDAY AT BEDTIME   Multiple Vitamin (MULTIVITAMIN) tablet Take 1 tablet by mouth daily.   pantoprazole (PROTONIX) 40 MG tablet Take 1 tablet (40 mg total) by mouth 2 (two) times daily before  a meal.   predniSONE (DELTASONE) 10 MG tablet TAKE 1 TABLET (10 MG TOTAL) BY MOUTH DAILY WITH BREAKFAST.   traMADol (ULTRAM) 50 MG tablet TAKE 1/2 TO 1 TAB BY MOUTH TWICE A DAY AS NEEDED FOR MODERATE PAIN FOR UP TO 5 DAYS   vitamin B-12 (CYANOCOBALAMIN) 500 MCG tablet Take 500 mcg by mouth every morning.    Immunization History  Administered Date(s) Administered   Fluad Quad(high Dose 65+) 09/09/2019, 09/07/2020   Influenza, High Dose Seasonal PF 09/07/2021   Influenza,inj,Quad PF,6+ Mos 09/11/2014, 09/24/2015, 09/20/2016, 09/14/2017, 08/29/2018   PFIZER(Purple Top)SARS-COV-2 Vaccination 12/20/2019, 01/10/2020, 09/05/2020   Pfizer Covid-19 Vaccine Bivalent Booster 43yrs & up 09/07/2021   Pneumococcal Conjugate-13 09/11/2014   Pneumococcal Polysaccharide-23 12/04/2009       Objective:   Physical Exam BP (!) 150/70 (BP Location: Left Arm, Patient Position: Sitting, Cuff Size: Normal)    Pulse (!) 118    Temp  (!) 97.3 F (36.3 C) (Oral)    Ht 5\' 8"  (1.727 m)    Wt 150 lb 6.4 oz (68.2 kg)    SpO2 94%    BMI 22.87 kg/m  GENERAL: Disheveled appearing elderly male, no acute distress.  Use of accessories, chronic.  Presents in transport chair (due to dyspnea). HEAD: Normocephalic, atraumatic. EYES: Pupils equal, round, reactive to light.  No scleral icterus. MOUTH: Nose/mouth/throat not examined due to masking requirements for COVID 19. NECK: Supple. No thyromegaly. Trachea midline. No JVD.  No adenopathy. PULMONARY: Distant breath sounds, poor air movement, coarse breath sounds with no wheezes noted.   CARDIOVASCULAR: S1 and S2.  Tachycardic rate with regular rhythm.    Grade 1/6 to 2/6 systolic ejection murmur left sternal border. GASTROINTESTINAL: Benign. MUSCULOSKELETAL: No joint deformity, no clubbing, no edema. NEUROLOGIC: No overt focal deficit noted.  Speech is fluent. SKIN: No rashes, port wine nevus on middle finger left hand. PSYCH: Mood and behavior are normal  Ambulatory oximetry was performed today: Patient with a resting heart rate of 122.  Within less than 250 feet heart rate to 127 sats to 88% and severe increased work of breathing.  Patient almost had a syncopal episode.  Had to be assisted to chair.  Patient placed on 1 L/min maintaining O2 sats of 96 to 97% with ambulation and showing significant relief of tachypnea.    Assessment & Plan:     ICD-10-CM   1. Stage 4 very severe COPD by GOLD classification ([place])  J44.9 AMB REFERRAL FOR DME    AMB REFERRAL FOR DME   Continue Breztri 2 puffs twice a day Continue as needed albuterol Add Daliresp 250 mcg daily for 1 month If tolerates Daliresp increase to 500 mcg/ p. 1 mo.    2. Bronchiectasis without complication (HCC)   I77.8    Flutter valve has not been very effective Will procure chest vest    3. Severe exertional dyspnea, likely fully explained by COPD  R06.09    Patient with evidence of exercise hypoxemia Oxygen  supplementation at 1 L/min     4. Nocturnal hypoxemia due to emphysema [facility])  J43.9    G47.36    Patient compliant with nocturnal oxygen Continue therapy    5. Tobacco dependence due to cigarettes  F17.210    Patient counseled regards to discontinuation of smoking Total counseling time 3 to 5 minutes    6. Counseling regarding end of life decision making  Z71.89    Initiated end-of-life discussions Patient would like to defer at present  Recommended palliative care     Orders Placed This Encounter  Procedures   AMB REFERRAL FOR DME    Referral Priority:   Routine    Referral Type:   Durable Medical Equipment Purchase    Number of Visits Requested:   1   AMB REFERRAL FOR DME    Referral Priority:   Routine    Referral Type:   Durable Medical Equipment Purchase    Number of Visits Requested:   1   Meds ordered this encounter  Medications   Roflumilast (DALIRESP) 250 MCG TABS    Sig: Take 1 tablet by mouth daily.    Dispense:  30 tablet    Refill:  1   We will see the patient in follow-up in 4 to 6 weeks time.  If he is doing well with Daliresp at that time at the 250 mcg dose we can increase to 500 mcg daily.  Patient has qualified for oxygen.  We will also order vest physiotherapy.  Renold Don, MD Advanced Bronchoscopy PCCM [facility]    *This note was dictated using voice recognition software/Dragon.  Despite best efforts to proofread, errors can occur which can change the meaning. Any transcriptional errors that result from this process are unintentional and may not be fully corrected at the time of dictation.

## 2021-12-18 DIAGNOSIS — R0600 Dyspnea, unspecified: Secondary | ICD-10-CM | POA: Diagnosis not present

## 2021-12-19 DIAGNOSIS — J449 Chronic obstructive pulmonary disease, unspecified: Secondary | ICD-10-CM | POA: Diagnosis not present

## 2021-12-22 ENCOUNTER — Telehealth: Payer: Self-pay | Admitting: Pulmonary Disease

## 2021-12-22 NOTE — Telephone Encounter (Signed)
Anita, please advise. Thanks 

## 2021-12-26 NOTE — Telephone Encounter (Signed)
Noted  

## 2021-12-26 NOTE — Telephone Encounter (Signed)
I spoke with Judson Roch from [employer] and when they talked with Mr. Bowlby he doesn't want to move forward with the Vest.  He can pay his out of pocket but doesn't want to move forward

## 2022-01-11 ENCOUNTER — Telehealth: Payer: Self-pay

## 2022-01-11 ENCOUNTER — Other Ambulatory Visit (HOSPITAL_COMMUNITY): Payer: Self-pay

## 2022-01-11 NOTE — Telephone Encounter (Signed)
Patient Advocate Encounter  Prior Authorization for Daliresp 278mcg tabs has been approved.    PA# [id number]  Effective dates: 01/11/22 through 01/11/23  Per Test Claim Patients co-pay is $80.   Spoke with Pharmacy to Process.  Patient Advocate Fax: [phone]

## 2022-01-13 ENCOUNTER — Telehealth: Payer: Self-pay | Admitting: Pulmonary Disease

## 2022-01-13 NOTE — Telephone Encounter (Signed)
Lm for patient.  

## 2022-01-13 NOTE — Telephone Encounter (Signed)
Spoke to patient.  He stated that he received a Rx for Azelastine. Patient stated that he has not taken this medication previously. According to our records, this medication was last prescribed 01/27/2021. It is not mentioned in last note.  Spoke to [place] with [facility] and requested that he cancel Rx.  Patient is aware and voiced his understanding.  Nothing further needed.

## 2022-01-15 ENCOUNTER — Other Ambulatory Visit: Payer: Self-pay | Admitting: Family Medicine

## 2022-01-18 DIAGNOSIS — R0600 Dyspnea, unspecified: Secondary | ICD-10-CM | POA: Diagnosis not present

## 2022-01-19 ENCOUNTER — Other Ambulatory Visit: Payer: Self-pay

## 2022-01-19 ENCOUNTER — Encounter: Payer: Self-pay | Admitting: Pulmonary Disease

## 2022-01-19 ENCOUNTER — Ambulatory Visit: Payer: PPO | Admitting: Pulmonary Disease

## 2022-01-19 VITALS — BP 130/60 | HR 114 | Temp 97.6°F | Ht 69.0 in | Wt 149.4 lb

## 2022-01-19 DIAGNOSIS — J9611 Chronic respiratory failure with hypoxia: Secondary | ICD-10-CM

## 2022-01-19 DIAGNOSIS — J209 Acute bronchitis, unspecified: Secondary | ICD-10-CM | POA: Diagnosis not present

## 2022-01-19 DIAGNOSIS — J479 Bronchiectasis, uncomplicated: Secondary | ICD-10-CM | POA: Diagnosis not present

## 2022-01-19 DIAGNOSIS — J440 Chronic obstructive pulmonary disease with acute lower respiratory infection: Secondary | ICD-10-CM | POA: Diagnosis not present

## 2022-01-19 DIAGNOSIS — J449 Chronic obstructive pulmonary disease, unspecified: Secondary | ICD-10-CM

## 2022-01-19 MED ORDER — DOXYCYCLINE HYCLATE 100 MG PO TABS: 100.0000 mg | ORAL_TABLET | Freq: Two times a day (BID) | ORAL | 0 refills | Status: DC

## 2022-01-19 NOTE — Patient Instructions (Signed)
Take the Daliresp every other day to see if that helps with your symptoms.  Once you get used to the medication then you can go back to daily use.  I am sending a prescription of doxycycline to help clear some of the bronchitis.  We will see him in follow-up in 2 months time call sooner should any new problems arise.  You may see me or the nurse practitioner at that time.

## 2022-01-19 NOTE — Progress Notes (Signed)
Subjective:    Patient ID: Parker Martinez, male    DOB: [birth year], 81 y.o.   MRN: [id number] Chief Complaint  Patient presents with   Follow-up  Requesting MD/Service: Ria Bush, MD Date of initial consultation: 05/06/18 by Dr. Merton Border Reason for consultation: Severe COPD, smoker   PT PROFILE: 81 y.o. male former smoker (previously > 1 PPD, with diagnosis of COPD rendered several years ago and with progressive DOE over past 5-7 years prior to initial consultation.   DATA: 10/19/10 Spirometry: Severe obstruction (FEV1 41% predicted) 05/28/18 PFTs: FVC: 2.13 > 2.96 L (58 > 80 %pred), FEV1: 0.85 > 1.04 L (30 > 36 %pred), FEV1/FVC: 40 >35%, TLC: 7.90 L (128 %pred), DLCO 50 %pred.  Flow volume curve consistent with severe obstruction 02/20/19 PFTs: FEV1 was 1.96 L or 48% predicted, FVC was 2.72 L or 95% predicted, FEV1/FVC was 38%.  TLC  131%, RV 165%, indicating hyperinflation and air trapping.  Diffusion capacity was 48%.  Findings consistent with severe obstruction and emphysema. 05/05/20 Overnight oximetry: Shows nocturnal desaturations and possible obstructive sleep apnea.  Patient will need sleep study 06/01/20 2D echo: LVEF 60 to 65%, no evidence of cor pulmonale, mild aortic sclerosis, diastolic dysfunction type I. 06/17/2020 chest x-ray two-view: Chronic COPD changes, upper lobe parenchymal scarring, right upper lobe nodules, stable. 10/21/2020 overnight oximetry: Significant desaturations during the night oxygen desaturation index 24 oxygen desaturation events 188.  Low O2 sats 85%, patient will require oxygen at 2 L/min nocturnally. 07/27/2021 echocardiogram: LVEF 55%, no wall motion abnormalities, grade II DD, RV function normal.  No overt valvulopathy.  Since prior 2D echo diastolic dysfunction has worsened. 09/30/2021 CT chest: Emphysematous changes, multiple nodules, mucous retention, focal bronchiectatic changes on left.  HPI Parker Martinez is an 81 year old recent  former smoker (71 PY) presents for follow-up on his severe COPD with chronic debilitating dyspnea.  He was last seen on 13 December 2021.  At that time we started Daliresp and ordered chest vest for airway clearance.  The patient declined the chest vest due to cost.  He is on Daliresp 250 mcg daily and having some issues with insomnia with the medication.  He is not using an Acapella flutter valve as previously instructed.  He does wear oxygen nocturnally, not using oxygen during the day as prescribed.  He states that he has palliative care come to his home several times a month.  Apparently this has been arranged through his insurance company.  He did discontinue all tobacco use since his last visit.  He was commended on this.  He continues to feel that [name] helps him he uses nebulizer medication prior to using [place].  He does not endorse any new symptomatology today.  He has not had any fevers, chills or sweats.  Cough is productive of thick whitish to grayish secretions.  No hemoptysis.  Secretions are hard to expectorate though this has been improved by Daliresp.   Review of Systems A 10 point review of systems was performed and it is as noted above otherwise negative.  Patient Active Problem List   Diagnosis Date Noted   Palliative care status 11/08/2021   Pulmonary hypertension ([place]) 05/19/2021   Orthostatic dizziness 05/19/2021   Chronic respiratory failure with hypoxia ([place]) 02/25/2021   Protein-calorie malnutrition ([place]) 11/04/2020   Tachycardia 10/20/2020   Right foot drop 09/21/2020   Pedal edema 09/07/2020   Aorto-iliac atherosclerosis ([place]) 01/25/2019   Left lumbar radiculopathy 01/24/2019   Bilateral hearing loss 10/21/2018  BPV (benign positional vertigo), right 09/09/2018   Heart murmur 09/09/2018   Encounter for chronic pain management 05/15/2017   Chronic pain in left shoulder 01/30/2017   Subclavian steal syndrome 10/12/2016   Atherosclerosis of native arteries of  extremity with intermittent claudication ([place]) 10/21/2014   Medicare annual wellness visit, subsequent 09/11/2014   Carotid stenosis 09/11/2014   Advanced care planning/counseling discussion 09/11/2014   Vitamin D deficiency 09/01/2014   DDD (degenerative disc disease), lumbar    Personal history of colonic adenomas 09/02/2013   COPD exacerbation ([place]) 03/04/2013   Healthcare maintenance 04/25/2012   Polycythemia secondary to smoking 04/25/2012   Dyslipidemia 04/25/2012   HTN (hypertension)    PVD (peripheral vascular disease) with claudication (HCC)    COPD, severe (HCC)    Prostate cancer (HCC)    GERD (gastroesophageal reflux disease)    Idiopathic anaphylaxis    Smoker 09/13/2011   Esophageal rings - lower esophagus 06/04/2009   Social History   Tobacco Use   Smoking status: Former    Packs/day: 1.00    Years: 60.00    Pack years: 60.00    Types: Cigarettes    Quit date: 12/13/2021    Years since quitting: 0.1   Smokeless tobacco: Never   Tobacco comments:    8-10 cigs daily--05/31/2021         Smoke occasionally 1 pack x week 09/12/21        5-6 cigs pd- 12/13/21     Quit in January   Substance Use Topics   Alcohol use: Yes    Alcohol/week: 14.0 standard drinks    Types: 14 Cans of beer per week    Comment: socially   Allergies  Allergen Reactions   Flonase [Fluticasone Propionate] Other (See Comments)    Per pt, causes rebound effect    Current Meds  Medication Sig   acetaminophen (TYLENOL) 325 MG tablet Take 1 tablet (325 mg total) by mouth every 6 (six) hours as needed for mild pain, fever or headache (or Fever >/= 101).   albuterol (PROVENTIL) (2.5 MG/3ML) 0.083% nebulizer solution INHALE 1 VIAL VIA NEBULIZER EVERY 4 HOURS AS NEEDED FOR WHEEZING OR SHORTNESS OF BREATH   albuterol (VENTOLIN HFA) 108 (90 Base) MCG/ACT inhaler USE 2 PUFFS EVERY 6 HOURS AS NEEDED FOR WHEEZING   alfuzosin (UROXATRAL) 10 MG 24 hr tablet Take 10 mg by mouth daily with breakfast.    aspirin 81 MG tablet Take 81 mg by mouth every morning.   atorvastatin (LIPITOR) 40 MG tablet TAKE 1 TABLET BY MOUTH EVERY DAY   BREZTRI AEROSPHERE 160-9-4.8 MCG/ACT AERO INHALE 2 PUFFS BY MOUTH TWICE A DAY   doxycycline (VIBRA-TABS) 100 MG tablet Take 1 tablet (100 mg total) by mouth 2 (two) times daily.   EPINEPHrine (EPIPEN 2-PAK) 0.3 mg/0.3 mL IJ SOAJ injection Inject 0.3 mLs (0.3 mg total) into the muscle as needed for anaphylaxis.   fexofenadine (ALLEGRA) 180 MG tablet Take 180 mg by mouth daily.   guaiFENesin (MUCINEX) 600 MG 12 hr tablet Take 1 tablet (600 mg total) by mouth 2 (two) times daily as needed.   ipratropium (ATROVENT) 0.06 % nasal spray Place 2 sprays into both nostrils 4 (four) times daily as needed for rhinitis.   montelukast (SINGULAIR) 10 MG tablet TAKE 1 TABLET BY MOUTH EVERYDAY AT BEDTIME   Multiple Vitamin (MULTIVITAMIN) tablet Take 1 tablet by mouth daily.   pantoprazole (PROTONIX) 40 MG tablet Take 1 tablet (40 mg total) by mouth 2 (two)  times daily before a meal.   predniSONE (DELTASONE) 10 MG tablet TAKE 1 TABLET (10 MG TOTAL) BY MOUTH DAILY WITH BREAKFAST.   Roflumilast (DALIRESP) 250 MCG TABS Take 1 tablet by mouth daily.   traMADol (ULTRAM) 50 MG tablet TAKE 1/2 TO 1 TAB BY MOUTH TWICE A DAY AS NEEDED FOR MODERATE PAIN FOR UP TO 5 DAYS   vitamin B-12 (CYANOCOBALAMIN) 500 MCG tablet Take 500 mcg by mouth every morning.    Immunization History  Administered Date(s) Administered   Fluad Quad(high Dose 65+) 09/09/2019, 09/07/2020   Influenza, High Dose Seasonal PF 09/07/2021   Influenza,inj,Quad PF,6+ Mos 09/11/2014, 09/24/2015, 09/20/2016, 09/14/2017, 08/29/2018   PFIZER(Purple Top)SARS-COV-2 Vaccination 12/20/2019, 01/10/2020, 09/05/2020   Pfizer Covid-19 Vaccine Bivalent Booster 68yrs & up 09/07/2021   Pneumococcal Conjugate-13 09/11/2014   Pneumococcal Polysaccharide-23 12/04/2009       Objective:   Physical Exam BP 130/60 (BP Location: Left Arm,  Patient Position: Sitting, Cuff Size: Normal)    Pulse (!) 114    Temp 97.6 F (36.4 C) (Oral)    Ht 5\' 9"  (1.753 m)    Wt 149 lb 6.4 oz (67.8 kg)    SpO2 92%    BMI 22.06 kg/m  GENERAL: Disheveled appearing elderly male, no acute distress.  Use of accessories, chronic.  Presents in transport chair (due to dyspnea). HEAD: Normocephalic, atraumatic. EYES: Pupils equal, round, reactive to light.  No scleral icterus. MOUTH: Nose/mouth/throat not examined due to masking requirements for COVID 19. NECK: Supple. No thyromegaly. Trachea midline. No JVD.  No adenopathy. PULMONARY: Distant breath sounds, poor air movement, coarse breath sounds with no wheezes noted.   CARDIOVASCULAR: S1 and S2.  Tachycardic rate with regular rhythm.    Grade 1/6 to 2/6 systolic ejection murmur left sternal border. GASTROINTESTINAL: Benign. MUSCULOSKELETAL: No joint deformity, no clubbing, no edema. NEUROLOGIC: No overt focal deficit noted.  Speech is fluent. SKIN: No rashes, port wine nevus on middle finger left hand. PSYCH: Mood and behavior are normal       Assessment & Plan:     ICD-10-CM   1. Stage 4 very severe COPD by GOLD classification ([place])  J44.9    Continue nebulization treatments Continue Breztri Daliresp every other day then increase to every day once acclimatized    2. Bronchiectasis without complication ([place])  H08.6    Patient declined vest due to cost Encourage use of Acapella flutter valve    3. Chronic respiratory failure with hypoxia (HCC)  J96.11    Continue oxygen nocturnally Oxygen at 1 L/min with exertion    4. Acute bronchitis with COPD ([place])  J44.0    J20.9    Doxycycline 100 mg twice daily     Meds ordered this encounter  Medications   doxycycline (VIBRA-TABS) 100 MG tablet    Sig: Take 1 tablet (100 mg total) by mouth 2 (two) times daily.    Dispense:  14 tablet    Refill:  0   Patient is very end-stage.  His prognosis is very poor.  Continue supportive care.  Patient  states that he is actually enrolled in palliative care through his insurance company.  Patient has made it very clear he is DNR/DNI.  We will see the patient in follow-up in 2 months time call sooner should any new problems arise.   Renold Don, MD Advanced Bronchoscopy PCCM [facility]    *This note was dictated using voice recognition software/Dragon.  Despite best efforts to proofread, errors can  occur which can change the meaning. Any transcriptional errors that result from this process are unintentional and may not be fully corrected at the time of dictation.

## 2022-01-26 ENCOUNTER — Telehealth: Payer: Self-pay | Admitting: Pulmonary Disease

## 2022-01-26 DIAGNOSIS — J441 Chronic obstructive pulmonary disease with (acute) exacerbation: Secondary | ICD-10-CM | POA: Diagnosis not present

## 2022-01-26 DIAGNOSIS — I951 Orthostatic hypotension: Secondary | ICD-10-CM | POA: Diagnosis not present

## 2022-01-26 DIAGNOSIS — Z9981 Dependence on supplemental oxygen: Secondary | ICD-10-CM | POA: Diagnosis not present

## 2022-01-26 DIAGNOSIS — Z720 Tobacco use: Secondary | ICD-10-CM | POA: Diagnosis not present

## 2022-01-26 DIAGNOSIS — E860 Dehydration: Secondary | ICD-10-CM | POA: Diagnosis not present

## 2022-01-26 NOTE — Telephone Encounter (Signed)
Spoke to [place] with [facility]. Patient recently started Daliresp.  During today's visit, patient seemed to be dehydrated . C/o unsteady gait and dizziness. BP dropped 20 points with orthostatics. Patient declined IV fluids.     Brandi prescribed prednisone taper today. He completed Doxy yesterday.   Sending to Dr. [name] as an Juluis Rainier

## 2022-01-26 NOTE — Telephone Encounter (Signed)
Spoke to [place] with [facility] and relayed below message.  She stated that [facility] has a hospice program. She will discuss this with patient at his next visit.  Nothing further needed.

## 2022-01-26 NOTE — Telephone Encounter (Signed)
Patient is end-stage and should consider hospice.

## 2022-01-26 NOTE — Telephone Encounter (Signed)
[facility] did routine visit with pt. It was turned into urgen tdue to dizziness and being off balance. Believes it may be related to side affect of new medication. Blood pressure high and heart beat is 20 BPM. Pt did complete doxyciclone and has a fu with [facility]. If CG has questions or recommendations, please call Brandi at number provided.

## 2022-02-01 ENCOUNTER — Other Ambulatory Visit: Payer: Self-pay | Admitting: Family Medicine

## 2022-02-02 DIAGNOSIS — I739 Peripheral vascular disease, unspecified: Secondary | ICD-10-CM | POA: Diagnosis not present

## 2022-02-02 DIAGNOSIS — Z7982 Long term (current) use of aspirin: Secondary | ICD-10-CM | POA: Diagnosis not present

## 2022-02-02 DIAGNOSIS — Z9981 Dependence on supplemental oxygen: Secondary | ICD-10-CM | POA: Diagnosis not present

## 2022-02-02 DIAGNOSIS — J449 Chronic obstructive pulmonary disease, unspecified: Secondary | ICD-10-CM | POA: Diagnosis not present

## 2022-02-02 DIAGNOSIS — Z720 Tobacco use: Secondary | ICD-10-CM | POA: Diagnosis not present

## 2022-02-09 ENCOUNTER — Other Ambulatory Visit: Payer: Self-pay | Admitting: Pulmonary Disease

## 2022-02-10 ENCOUNTER — Other Ambulatory Visit: Payer: Self-pay | Admitting: Pulmonary Disease

## 2022-02-10 DIAGNOSIS — Z515 Encounter for palliative care: Secondary | ICD-10-CM | POA: Diagnosis not present

## 2022-02-10 DIAGNOSIS — Z720 Tobacco use: Secondary | ICD-10-CM | POA: Diagnosis not present

## 2022-02-10 DIAGNOSIS — J449 Chronic obstructive pulmonary disease, unspecified: Secondary | ICD-10-CM | POA: Diagnosis not present

## 2022-02-10 DIAGNOSIS — Z66 Do not resuscitate: Secondary | ICD-10-CM | POA: Diagnosis not present

## 2022-02-10 DIAGNOSIS — I10 Essential (primary) hypertension: Secondary | ICD-10-CM | POA: Diagnosis not present

## 2022-02-15 DIAGNOSIS — R0600 Dyspnea, unspecified: Secondary | ICD-10-CM | POA: Diagnosis not present

## 2022-02-16 DIAGNOSIS — J449 Chronic obstructive pulmonary disease, unspecified: Secondary | ICD-10-CM | POA: Diagnosis not present

## 2022-02-21 ENCOUNTER — Telehealth: Payer: Self-pay | Admitting: Pulmonary Disease

## 2022-02-21 ENCOUNTER — Ambulatory Visit
Admission: RE | Admit: 2022-02-21 | Discharge: 2022-02-21 | Disposition: A | Discharge status: Home / Self Care | Payer: PPO | Source: Ambulatory Visit | Attending: Pulmonary Disease | Admitting: Pulmonary Disease

## 2022-02-21 ENCOUNTER — Other Ambulatory Visit: Payer: Self-pay

## 2022-02-21 DIAGNOSIS — J439 Emphysema, unspecified: Secondary | ICD-10-CM | POA: Diagnosis not present

## 2022-02-21 DIAGNOSIS — J984 Other disorders of lung: Secondary | ICD-10-CM | POA: Diagnosis not present

## 2022-02-21 DIAGNOSIS — J449 Chronic obstructive pulmonary disease, unspecified: Secondary | ICD-10-CM

## 2022-02-21 DIAGNOSIS — R911 Solitary pulmonary nodule: Secondary | ICD-10-CM

## 2022-02-21 NOTE — Telephone Encounter (Signed)
ATC kelly at number provided- a male answer and stated that I had the wrong number.  ?Spoke to patient. He would like an order placed to [facility] for [place]. Order placed. ?Nothing further needed.  ? ?

## 2022-02-24 ENCOUNTER — Other Ambulatory Visit: Payer: Self-pay | Admitting: Pulmonary Disease

## 2022-02-27 NOTE — Telephone Encounter (Signed)
Please advise if okay to refill. 

## 2022-02-27 NOTE — Telephone Encounter (Signed)
Okay to refill 10 mg daily prednisone. ?

## 2022-03-01 DIAGNOSIS — Z8719 Personal history of other diseases of the digestive system: Secondary | ICD-10-CM | POA: Diagnosis not present

## 2022-03-01 DIAGNOSIS — E441 Mild protein-calorie malnutrition: Secondary | ICD-10-CM | POA: Diagnosis not present

## 2022-03-01 DIAGNOSIS — J449 Chronic obstructive pulmonary disease, unspecified: Secondary | ICD-10-CM | POA: Diagnosis not present

## 2022-03-01 DIAGNOSIS — J9610 Chronic respiratory failure, unspecified whether with hypoxia or hypercapnia: Secondary | ICD-10-CM | POA: Diagnosis not present

## 2022-03-01 DIAGNOSIS — Z66 Do not resuscitate: Secondary | ICD-10-CM | POA: Diagnosis not present

## 2022-03-01 DIAGNOSIS — Z515 Encounter for palliative care: Secondary | ICD-10-CM | POA: Diagnosis not present

## 2022-03-01 DIAGNOSIS — I10 Essential (primary) hypertension: Secondary | ICD-10-CM | POA: Diagnosis not present

## 2022-03-01 DIAGNOSIS — F3342 Major depressive disorder, recurrent, in full remission: Secondary | ICD-10-CM | POA: Diagnosis not present

## 2022-03-01 DIAGNOSIS — Z720 Tobacco use: Secondary | ICD-10-CM | POA: Diagnosis not present

## 2022-03-01 DIAGNOSIS — R1319 Other dysphagia: Secondary | ICD-10-CM | POA: Diagnosis not present

## 2022-03-05 ENCOUNTER — Other Ambulatory Visit: Payer: Self-pay | Admitting: Pulmonary Disease

## 2022-03-06 NOTE — Telephone Encounter (Signed)
Yes it will be okay to refill. ?

## 2022-03-06 NOTE — Telephone Encounter (Signed)
Dr. [name], please advise if okay to refill? Thanks ?

## 2022-03-18 DIAGNOSIS — R0600 Dyspnea, unspecified: Secondary | ICD-10-CM | POA: Diagnosis not present

## 2022-03-19 DIAGNOSIS — J449 Chronic obstructive pulmonary disease, unspecified: Secondary | ICD-10-CM | POA: Diagnosis not present

## 2022-03-21 ENCOUNTER — Ambulatory Visit: Payer: PPO | Admitting: Pulmonary Disease

## 2022-03-21 ENCOUNTER — Encounter: Payer: Self-pay | Admitting: Pulmonary Disease

## 2022-03-21 VITALS — BP 124/60 | HR 100 | Temp 97.7°F | Ht 69.0 in | Wt 155.0 lb

## 2022-03-21 DIAGNOSIS — R911 Solitary pulmonary nodule: Secondary | ICD-10-CM | POA: Diagnosis not present

## 2022-03-21 DIAGNOSIS — J449 Chronic obstructive pulmonary disease, unspecified: Secondary | ICD-10-CM

## 2022-03-21 DIAGNOSIS — J9611 Chronic respiratory failure with hypoxia: Secondary | ICD-10-CM

## 2022-03-21 DIAGNOSIS — J479 Bronchiectasis, uncomplicated: Secondary | ICD-10-CM

## 2022-03-21 DIAGNOSIS — R0609 Other forms of dyspnea: Secondary | ICD-10-CM

## 2022-03-21 MED ORDER — BREZTRI AEROSPHERE 160-9-4.8 MCG/ACT IN AERO: 2.0000 | INHALATION_SPRAY | Freq: Two times a day (BID) | RESPIRATORY_TRACT | 0 refills | Status: DC

## 2022-03-21 MED ORDER — BREZTRI AEROSPHERE 160-9-4.8 MCG/ACT IN AERO: 2.0000 | INHALATION_SPRAY | Freq: Two times a day (BID) | RESPIRATORY_TRACT | 11 refills | Status: AC

## 2022-03-21 NOTE — Patient Instructions (Signed)
We are going to get a PET/CT on the area in your lung that seems to be getting somewhat larger.  If this is positive I will refer you to Dr. Noreene Filbert our radiation oncologist for SBRT.  You are not a candidate for surgery or invasive procedures. ? ?Continue the Breztri and other medications as you are doing. ? ?We will see you in follow-up in 2 to 3 months time call sooner should any new problems arise. ?

## 2022-03-21 NOTE — Progress Notes (Signed)
? ?Subjective:  ? ? Patient ID: Parker Martinez, male    DOB: 10-Jun-1941, 81 y.o.   MRN: [id number] ?Patient Care Team: ?Ria Bush, MD as PCP - General (Family Medicine) ?[name], MD as Consulting Physician (Ophthalmology) ?Angelia Mould, MD as Consulting Physician (Vascular Surgery) ?Gatha Mayer, MD as Consulting Physician (Gastroenterology) ?Myrlene Broker, MD as Attending Physician (Urology) ?Wilhelmina Mcardle, MD (Inactive) as Consulting Physician (Pulmonary Disease) ?Debbora Dus, [facility] as Pharmacist (Pharmacist) ? ?Requesting MD/Service: Ria Bush, MD ?Date of initial consultation: 05/06/18 by Dr. Merton Border ?Reason for consultation: Severe COPD, smoker ?  ?PT PROFILE: ?81 y.o. male former smoker (previously > 1 PPD, with diagnosis of COPD rendered several years ago and with progressive DOE over past 5-7 years prior to initial consultation. ?  ?DATA: ?10/19/10 Spirometry: Severe obstruction (FEV1 41% predicted) ?05/28/18 PFTs: FVC: 2.13 > 2.96 L (58 > 80 %pred), FEV1: 0.85 > 1.04 L (30 > 36 %pred), FEV1/FVC: 40 >35%, TLC: 7.90 L (128 %pred), DLCO 50 %pred.  Flow volume curve consistent with severe obstruction ?02/20/19 PFTs: FEV1 was 1.96 L or 48% predicted, FVC was 2.72 L or 95% predicted, FEV1/FVC was 38%.  ?TLC  131%, RV 165%, indicating hyperinflation and air trapping.  Diffusion capacity was 48%.  Findings consistent with severe obstruction and emphysema. ?05/05/20 Overnight oximetry: Shows nocturnal desaturations and possible obstructive sleep apnea.  Patient will need sleep study ?06/01/20 2D echo: LVEF 60 to 65%, no evidence of cor pulmonale, mild aortic sclerosis, diastolic dysfunction type I. ?06/17/2020 chest x-ray two-view: Chronic COPD changes, upper lobe parenchymal scarring, right upper lobe nodules, stable. ?10/21/2020 overnight oximetry: Significant desaturations during the night oxygen desaturation index 24 oxygen desaturation events 188.  Low O2  sats 85%, patient will require oxygen at 2 L/min nocturnally. ?07/27/2021 echocardiogram: LVEF 55%, no wall motion abnormalities, grade II DD, RV function normal.  No overt valvulopathy.  Since prior 2D echo diastolic dysfunction has worsened. ?09/30/2021 CT chest: Emphysematous changes, multiple nodules, mucous retention, focal bronchiectatic changes on left. ?10/20/2021 PET/CT: The left-sided nodules have very low-grade activity likely related to apical scarring suggest short-term follow-up. ?02/21/2022 chest CT without contrast: Severe pulmonary emphysema, enlargement of LEFT upper lobe nodule along bandlike changes, stable left upper lobe nodule and cystic area within nodularity of the left upper lobe nodule, new LEFT lower lobe nodule consistent with infection but needs attention on follow-up, areas of groundglass on the RIGHT upper lobe. ? ?  ?Chief Complaint  ?Patient presents with  ? Follow-up  ?  SOB with exertion, prod cough with clear sputum and wheezing.   ? ?HPI ?[name] is an 81 year old former smoker (quit January 2023, 60 PY) with very severe COPD and chronic debilitating dyspnea.  He was last seen on 19 January 2022.  At a prior visit we had recommended chest vest to help with clearance of secretions however the patient did not want to proceed due to cost.  He has been placed on Daliresp and this has helped with his secretion clearance.  Also uses Acapella flutter valve.  He has had issues with left upper lobe nodules that on prior CT showed very low grade activity likely related to inflammatory change/inflammation.  However he had a chest CT on 21 February 2022 to follow-up on these lesions and unfortunately he has had growth on the left upper lobe nodule which is highly suspicious for malignancy.  I discussed with the patient that he is not a candidate for  invasive procedures however he would like to at least get an idea of what this growth on this nodule represents.  He is interested in pursuing  PET/CT. ? ?Overall he actually looks fairly good today.  He presents in transport chair due to dyspnea.  Does not endorse any new symptomatology.  Secretions are better since starting on Daliresp and using Acapella regularly.  He still prefers Breztri to any other medication for his COPD.  He has not had any hemoptysis.  Weight has been stable.   ? ? ?Review of Systems ?A 10 point review of systems was performed and it is as noted above otherwise negative. ? ?Patient Active Problem List  ? Diagnosis Date Noted  ? Palliative care status 11/08/2021  ? Pulmonary hypertension ([place]) 05/19/2021  ? Orthostatic dizziness 05/19/2021  ? Chronic respiratory failure with hypoxia ([place]) 02/25/2021  ? Protein-calorie malnutrition ([place]) 11/04/2020  ? Tachycardia 10/20/2020  ? Right foot drop 09/21/2020  ? Pedal edema 09/07/2020  ? Aorto-iliac atherosclerosis ([place]) 01/25/2019  ? Left lumbar radiculopathy 01/24/2019  ? Bilateral hearing loss 10/21/2018  ? BPV (benign positional vertigo), right 09/09/2018  ? Heart murmur 09/09/2018  ? Encounter for chronic pain management 05/15/2017  ? Chronic pain in left shoulder 01/30/2017  ? Subclavian steal syndrome 10/12/2016  ? Atherosclerosis of native arteries of extremity with intermittent claudication ([place]) 10/21/2014  ? Medicare annual wellness visit, subsequent 09/11/2014  ? Carotid stenosis 09/11/2014  ? Advanced care planning/counseling discussion 09/11/2014  ? Vitamin D deficiency 09/01/2014  ? DDD (degenerative disc disease), lumbar   ? Personal history of colonic adenomas 09/02/2013  ? COPD exacerbation ([place]) 03/04/2013  ? Healthcare maintenance 04/25/2012  ? Polycythemia secondary to smoking 04/25/2012  ? Dyslipidemia 04/25/2012  ? HTN (hypertension)   ? PVD (peripheral vascular disease) with claudication ([place])   ? COPD, severe ([place])   ? Prostate cancer ([place])   ? GERD (gastroesophageal reflux disease)   ? Idiopathic anaphylaxis   ? Smoker 09/13/2011  ? Esophageal rings - lower  esophagus 06/04/2009  ? ?Social History  ? ?Tobacco Use  ? Smoking status: Former  ?  Packs/day: 1.00  ?  Years: 60.00  ?  Pack years: 60.00  ?  Types: Cigarettes  ?  Quit date: 12/13/2021  ?  Years since quitting: 0.2  ? Smokeless tobacco: Never  ? Tobacco comments:  ?    ?    ?  5-6 cigs pd- 12/13/21  ?   Quit in January   ?Substance Use Topics  ? Alcohol use: Yes  ?  Alcohol/week: 14.0 standard drinks  ?  Types: 14 Cans of beer per week  ?  Comment: socially  ? ?Allergies  ?Allergen Reactions  ? Flonase [Fluticasone Propionate] Other (See Comments)  ?  Per pt, causes rebound effect   ? ?Current Meds  ?Medication Sig  ? acetaminophen (TYLENOL) 325 MG tablet Take 1 tablet (325 mg total) by mouth every 6 (six) hours as needed for mild pain, fever or headache (or Fever >/= 101).  ? albuterol (PROVENTIL) (2.5 MG/3ML) 0.083% nebulizer solution INHALE 1 VIAL VIA NEBULIZER EVERY 4 HOURS AS NEEDED FOR WHEEZING OR SHORTNESS OF BREATH  ? albuterol (VENTOLIN HFA) 108 (90 Base) MCG/ACT inhaler USE 2 PUFFS EVERY 6 HOURS AS NEEDED FOR WHEEZING  ? alfuzosin (UROXATRAL) 10 MG 24 hr tablet Take 10 mg by mouth daily with breakfast.  ? aspirin 81 MG tablet Take 81 mg by mouth every morning.  ? atorvastatin (LIPITOR)  40 MG tablet TAKE 1 TABLET BY MOUTH EVERY DAY  ? Budeson-Glycopyrrol-Formoterol (BREZTRI AEROSPHERE) 160-9-4.8 MCG/ACT AERO Inhale 2 puffs into the lungs in the morning and at bedtime.  ? EPINEPHrine (EPIPEN 2-PAK) 0.3 mg/0.3 mL IJ SOAJ injection Inject 0.3 mLs (0.3 mg total) into the muscle as needed for anaphylaxis.  ? fexofenadine (ALLEGRA) 180 MG tablet Take 180 mg by mouth daily.  ? ipratropium (ATROVENT) 0.06 % nasal spray SPRAY 2 SPRAYS INTO EACH NOSTRIL 4 TIMES A DAY AS NEEDED FOR RHINITIS  ? montelukast (SINGULAIR) 10 MG tablet TAKE 1 TABLET BY MOUTH EVERYDAY AT BEDTIME  ? Multiple Vitamin (MULTIVITAMIN) tablet Take 1 tablet by mouth daily.  ? pantoprazole (PROTONIX) 40 MG tablet Take 1 tablet (40 mg total) by  mouth 2 (two) times daily before a meal.  ? predniSONE (DELTASONE) 10 MG tablet TAKE 1 TABLET (10 MG TOTAL) BY MOUTH DAILY WITH BREAKFAST.  ? Roflumilast 250 MCG TABS TAKE 1 TABLET BY MOUTH EVERY DAY  ? traMAD

## 2022-03-29 ENCOUNTER — Other Ambulatory Visit: Payer: Self-pay | Admitting: Family Medicine

## 2022-03-31 ENCOUNTER — Ambulatory Visit (HOSPITAL_COMMUNITY)
Admission: RE | Admit: 2022-03-31 | Discharge: 2022-03-31 | Disposition: A | Discharge status: Home / Self Care | Payer: PPO | Source: Ambulatory Visit | Attending: Pulmonary Disease | Admitting: Pulmonary Disease

## 2022-03-31 DIAGNOSIS — R000 Tachycardia, unspecified: Secondary | ICD-10-CM | POA: Diagnosis not present

## 2022-03-31 DIAGNOSIS — T782XXA Anaphylactic shock, unspecified, initial encounter: Secondary | ICD-10-CM | POA: Diagnosis not present

## 2022-03-31 DIAGNOSIS — R911 Solitary pulmonary nodule: Secondary | ICD-10-CM

## 2022-03-31 DIAGNOSIS — L509 Urticaria, unspecified: Secondary | ICD-10-CM | POA: Diagnosis not present

## 2022-03-31 DIAGNOSIS — T7840XA Allergy, unspecified, initial encounter: Secondary | ICD-10-CM | POA: Diagnosis not present

## 2022-03-31 DIAGNOSIS — I10 Essential (primary) hypertension: Secondary | ICD-10-CM | POA: Diagnosis not present

## 2022-03-31 LAB — GLUCOSE, CAPILLARY: Glucose-Capillary: 113 mg/dL — ABNORMAL HIGH (ref 70–99)

## 2022-03-31 MED ORDER — FLUDEOXYGLUCOSE F - 18 (FDG) INJECTION
10.0000 | Freq: Once | INTRAVENOUS | Status: AC
  Administered 2022-03-31: 7.73 via INTRAVENOUS

## 2022-04-01 ENCOUNTER — Other Ambulatory Visit: Payer: Self-pay | Admitting: Family Medicine

## 2022-04-03 ENCOUNTER — Telehealth: Payer: Self-pay

## 2022-04-03 NOTE — Telephone Encounter (Signed)
Appears from access note that oncall provider gave refill for epipen; I spoke with pt; pt said that he did get epipen picked up already. Pt said that he is sitting and resting at present time and appreciated call. Sending note to  DR Baldwin Crown CMA. ?

## 2022-04-03 NOTE — Telephone Encounter (Signed)
Noted  

## 2022-04-03 NOTE — Telephone Encounter (Signed)
[facility] Night - Client ?>>>Contains Verbal Order - Signature Required<<< ?TELEPHONE ADVICE RECORD ?AccessNurse? ?Patient ?Name: ?Parker Martinez ?DGES ?Gender: Male ?DOB: Dec 25, 1940 ?Age: 81 Y 27 M 2 D ?Return ?Phone ?Number: ?[id number] ?(Primary) ?Address: ?City/ ?State/ ?Zip: ?[place] ?[address] ?Client [place] Night - Client ?Client Site [facility] ?Provider Ria Bush - MD ?Contact Type Call ?Who Is Calling Patient / Member / Family / Caregiver ?Call Type Triage / Clinical ?Relationship To Patient Self ?Return Phone Number [phone] (Primary) ?Chief Complaint BREATHING - shortness of breath or sounds ?breathless ?Reason for Call Request to speak with on call physician (non ?NBN) ?Initial Comment Caller states needs prescription medication refill, ?states pharmacy needs script, caller states has ?CIPD and its having a hard time breathing, sounds ?breathless, states he went to hospital yesterday. ?states will like to speak with on call about his ?refill. ?Translation No ?Nurse Assessment ?Nurse: Volanda Napoleon, RN, Wells Guiles Date/Time Eilene Ghazi Time): 04/01/2022 1:30:26 PM ?Confirm and document reason for call. If ?symptomatic, describe symptoms. ?---Caller states that he needs refill of epipen, used ?his last one last night. Caller also has COPD and has ?increased SOB. Caller states that paramedics gave him ?benadryl, and solumedrol. ?Does the patient have any new or worsening ?symptoms? ---Yes ?Will a triage be completed? ---Yes ?Related visit to physician within the last 2 weeks? ---No ?Does the PT have any chronic conditions? (i.e. ?diabetes, asthma, this includes High risk factors for ?pregnancy, etc.) ?---Yes ?List chronic conditions. ---COPD, ?Is this a behavioral health or substance abuse call? ---No ?Guidelines ?Guideline Title Affirmed Question Affirmed Notes Nurse Date/Time (Eastern ?Time) ?Breathing Difficulty SEVERE  difficulty ?breathing (e.g., ?struggling for each ?Volanda Napoleon, RN, Wells Guiles 04/01/2022 1:32:31 ?PM ?PLEASE NOTE: All timestamps contained within this report are represented as [place] Standard Time. ?CONFIDENTIALTY NOTICE: This fax transmission is intended only for the addressee. It contains information that is legally privileged, confidential or ?otherwise protected from use or disclosure. If you are not the intended recipient, you are strictly prohibited from reviewing, disclosing, copying using ?or disseminating any of this information or taking any action in reliance on or regarding this information. If you have received this fax in error, please ?notify us immediately by telephone so that we can arrange for its return to [place]. Phone: [phone], Toll-Free: [phone], Fax: [phone] ?Page: 2 of 4 ?Call Id: [id number] ?Guidelines ?Guideline Title Affirmed Question Affirmed Notes Nurse Date/Time (Eastern ?Time) ?breath, speaks in ?single words) ?Disp. Time (Eastern ?Time) Disposition Final User ?04/01/2022 1:28:37 PM Send to Urgent Judyann Munson, [place] ?04/01/2022 1:35:26 PM Paged On Call back to Call [place], RN, Wells Guiles ?04/01/2022 1:36:41 PM [place] Volanda Napoleon, RN, Wells Guiles ?Reason: Caller refusing 911 ?04/01/2022 1:38:12 PM Paged On Call back to Call [place], RN, Wells Guiles ?04/01/2022 1:52:07 PM Pharmacy Call Volanda Napoleon, RN, Wells Guiles ?Reason: epipen rx called into pharm ?04/01/2022 1:33:17 PM Call EMS 911 Now Yes Volanda Napoleon, RN, Wells Guiles ?Caller Disagree/Comply Disagree ?Caller Understands Yes ?PreDisposition Did not know what to do ?Care Advice Given Per Guideline ?CALL EMS 911 NOW: * Immediate medical attention is needed. You need to hang up and call 911 (or an ambulance). * Triager ?Discretion: I'll call you back in a few minutes to be sure you were able to reach them. ?Verbal Orders/Maintenance Medications ?Medication Refill Route Dosage Regime Duration Admin Instructions User  Name ?epipen ?Per ?Package ?Instructions ?as needed ?Admin epipen ?as needed for ?anaphylaxis then call ?911 ?Volanda Napoleon, RN, ?Wells Guiles ?Comments ?  User: Viviann Spare, RN Date/Time Eilene Ghazi Time): 04/01/2022 1:36:24 PM ?Caller refusing to call 911, states he always sounds that short of breath, he has COPD and had anaphalyaxis last ?night. Caller only wanting refill of epipen. ?PLEASE NOTE: All timestamps contained within this report are represented as [place] Standard Time. ?CONFIDENTIALTY NOTICE: This fax transmission is intended only for the addressee. It contains information that is legally privileged, confidential or ?otherwise protected from use or disclosure. If you are not the intended recipient, you are strictly prohibited from reviewing, disclosing, copying using ?or disseminating any of this information or taking any action in reliance on or regarding this information. If you have received this fax in error, please ?notify us immediately by telephone so that we can arrange for its return to [place]. Phone: [phone], Toll-Free: [phone], Fax: [phone] ?Page: 3 of 4 ?Call Id: [id number] ?Paging ?DoctorName Phone DateTime Result/ ?Outcome Message Type Notes ?Crissie Sickles - MD [phone] ?04/01/2022 ?1:35:26 ?PM ?Paged On Call ?Back to Call ?Center ?Doctor Paged ?This is Wells Guiles, Therapist, sports with ?accessnurse. Please call me back ?about a patient, [phone] ?Crissie Sickles - MD ?04/01/2022 ?1:46:19 ?PM ?Spoke with On ?Call - General Message Result on call gave verbal order fo ?

## 2022-04-06 ENCOUNTER — Other Ambulatory Visit: Payer: Self-pay | Admitting: Pulmonary Disease

## 2022-04-07 ENCOUNTER — Encounter: Payer: Self-pay | Admitting: Pulmonary Disease

## 2022-04-07 ENCOUNTER — Other Ambulatory Visit: Payer: Self-pay

## 2022-04-07 DIAGNOSIS — R911 Solitary pulmonary nodule: Secondary | ICD-10-CM

## 2022-04-10 ENCOUNTER — Ambulatory Visit
Admission: RE | Admit: 2022-04-10 | Discharge: 2022-04-10 | Disposition: A | Discharge status: Home / Self Care | Payer: PPO | Source: Ambulatory Visit | Attending: Family Medicine | Admitting: Family Medicine

## 2022-04-10 ENCOUNTER — Ambulatory Visit (INDEPENDENT_AMBULATORY_CARE_PROVIDER_SITE_OTHER): Payer: PPO | Admitting: Family Medicine

## 2022-04-10 ENCOUNTER — Encounter: Payer: Self-pay | Admitting: Family Medicine

## 2022-04-10 ENCOUNTER — Telehealth: Payer: Self-pay

## 2022-04-10 VITALS — BP 140/72 | HR 96 | Temp 97.9°F | Ht 69.0 in | Wt 152.4 lb

## 2022-04-10 DIAGNOSIS — J9611 Chronic respiratory failure with hypoxia: Secondary | ICD-10-CM | POA: Diagnosis not present

## 2022-04-10 DIAGNOSIS — J449 Chronic obstructive pulmonary disease, unspecified: Secondary | ICD-10-CM

## 2022-04-10 DIAGNOSIS — M79661 Pain in right lower leg: Secondary | ICD-10-CM

## 2022-04-10 DIAGNOSIS — C61 Malignant neoplasm of prostate: Secondary | ICD-10-CM

## 2022-04-10 DIAGNOSIS — I739 Peripheral vascular disease, unspecified: Secondary | ICD-10-CM | POA: Diagnosis not present

## 2022-04-10 DIAGNOSIS — M79604 Pain in right leg: Secondary | ICD-10-CM | POA: Diagnosis not present

## 2022-04-10 DIAGNOSIS — M7989 Other specified soft tissue disorders: Secondary | ICD-10-CM | POA: Diagnosis not present

## 2022-04-10 DIAGNOSIS — R600 Localized edema: Secondary | ICD-10-CM | POA: Diagnosis not present

## 2022-04-10 NOTE — Telephone Encounter (Signed)
Pt seen in office today.

## 2022-04-10 NOTE — Progress Notes (Signed)
? ? Patient ID: Parker Martinez, male    DOB: 23-Aug-1941, 81 y.o.   MRN: [id number] ? ?This visit was conducted in person. ? ?BP 140/72   Pulse 96   Temp 97.9 ?F (36.6 ?C) (Temporal)   Ht 5\' 9"  (1.753 m)   Wt 152 lb 6 oz (69.1 kg)   SpO2 94% Comment: 2 L  BMI 22.50 kg/m?   ? ?CC: R lower leg pain/swelling  ?Subjective:  ? ?HPI: ?Parker Martinez is a 81 y.o. male presenting on 04/10/2022 for Leg Pain (C/o R lower leg pain/swelling.  Started yesterday.  Pt accompanied by wife, Judson Roch. ) ? ? ?1-2d h/o R lower leg pain and swelling circumferentially across lower leg between knee and ankle. Notes he has chronic bilateral ankle swelling, currently worse at left ankle. Notes chronic paresthesias to B foot, unchanged.  ?No recent prolonged periods of immobility. No hormonal medication.  ?No h/o blood clots.  ?Denies inciting trauma/injury.  ?No fevers/chills, numbness or weakness of the legs.  ?No new dyspnea or chest pain. ?Notes chronic cramping to legs and feet ?H/o gout - this discomfort feels different.  ? ?Severe oxygen dependent COPD - sees pulm, on breztri, nebulizer treatments, as well as recent Daliresp QOD commencement. Continues prednisone 10mg  daily. ? ?H/o lumbar surgery 4-5 yrs ago.  ? ?H/o R foot drop with paresthesias/numnbess to R foot - this resolved last year with PT. Lumbar MRI 11/2020 showed IMPRESSION: ?1. Postoperative changes from interval left hemi laminectomy and micro discectomy at L4-5 for previously identified left subarticular disc protrusion. Small residual and/or recurrent left subarticular disc protrusion at this level, potentially affecting the descending left L5 nerve root.  ?2. Degenerative disc bulge with reactive endplate change and facet hypertrophy at L5-S1 with resultant moderate right worse than left L5 foraminal stenosis, stable. ?3. Disc bulge with small central disc extrusion with inferior migration at L3-4 with resultant mild spinal stenosis, stable.  ?   ? ?Relevant past  medical, surgical, family and social history reviewed and updated as indicated. Interim medical history since our last visit reviewed. ?Allergies and medications reviewed and updated. ?Outpatient Medications Prior to Visit  ?Medication Sig Dispense Refill  ? acetaminophen (TYLENOL) 325 MG tablet Take 1 tablet (325 mg total) by mouth every 6 (six) hours as needed for mild pain, fever or headache (or Fever >/= 101).    ? albuterol (PROVENTIL) (2.5 MG/3ML) 0.083% nebulizer solution INHALE 1 VIAL VIA NEBULIZER EVERY 4 HOURS AS NEEDED FOR WHEEZING OR SHORTNESS OF BREATH 375 mL 2  ? albuterol (VENTOLIN HFA) 108 (90 Base) MCG/ACT inhaler USE 2 PUFFS BY MOUTH EVERY 6 HOURS AS NEEDED FOR WHEEZING 18 each 2  ? alfuzosin (UROXATRAL) 10 MG 24 hr tablet Take 10 mg by mouth daily with breakfast.    ? aspirin 81 MG tablet Take 81 mg by mouth every morning.    ? atorvastatin (LIPITOR) 40 MG tablet TAKE 1 TABLET BY MOUTH EVERY DAY 90 tablet 2  ? Budeson-Glycopyrrol-Formoterol (BREZTRI AEROSPHERE) 160-9-4.8 MCG/ACT AERO Inhale 2 puffs into the lungs 2 (two) times daily. 10.7 g 11  ? Budeson-Glycopyrrol-Formoterol (BREZTRI AEROSPHERE) 160-9-4.8 MCG/ACT AERO Inhale 2 puffs into the lungs in the morning and at bedtime. 5.9 g 0  ? EPINEPHrine (EPIPEN 2-PAK) 0.3 mg/0.3 mL IJ SOAJ injection Inject 0.3 mLs (0.3 mg total) into the muscle as needed for anaphylaxis. 1 each 1  ? fexofenadine (ALLEGRA) 180 MG tablet Take 180 mg by mouth daily.    ?  ipratropium (ATROVENT) 0.06 % nasal spray SPRAY 2 SPRAYS INTO EACH NOSTRIL 4 TIMES A DAY AS NEEDED FOR RHINITIS 15 mL 1  ? montelukast (SINGULAIR) 10 MG tablet TAKE 1 TABLET BY MOUTH EVERYDAY AT BEDTIME 90 tablet 3  ? Multiple Vitamin (MULTIVITAMIN) tablet Take 1 tablet by mouth daily.    ? pantoprazole (PROTONIX) 40 MG tablet Take 1 tablet (40 mg total) by mouth 2 (two) times daily before a meal. 180 tablet 3  ? predniSONE (DELTASONE) 10 MG tablet TAKE 1 TABLET (10 MG TOTAL) BY MOUTH DAILY WITH  BREAKFAST. 30 tablet 6  ? Roflumilast 250 MCG TABS TAKE 1 TABLET BY MOUTH EVERY DAY 28 tablet 2  ? traMADol (ULTRAM) 50 MG tablet TAKE 1/2 TO 1 TAB BY MOUTH TWICE A DAY AS NEEDED FOR MODERATE PAIN FOR UP TO 5 DAYS 14 tablet 1  ? vitamin B-12 (CYANOCOBALAMIN) 500 MCG tablet Take 500 mcg by mouth every morning.     ? ?No facility-administered medications prior to visit.  ?  ? ?Per HPI unless specifically indicated in ROS section below ?Review of Systems ? ?Objective:  ?BP 140/72   Pulse 96   Temp 97.9 ?F (36.6 ?C) (Temporal)   Ht 5\' 9"  (1.753 m)   Wt 152 lb 6 oz (69.1 kg)   SpO2 94% Comment: 2 L  BMI 22.50 kg/m?   ?Wt Readings from Last 3 Encounters:  ?04/11/22 153 lb 11.2 oz (69.7 kg)  ?04/10/22 152 lb 6 oz (69.1 kg)  ?03/21/22 155 lb (70.3 kg)  ?  ?  ?Physical Exam ?Vitals and nursing note reviewed.  ?Constitutional:   ?   Appearance: Normal appearance. He is not ill-appearing.  ?   Comments: Supplemental O2 via [place]  ?Cardiovascular:  ?   Rate and Rhythm: Normal rate and regular rhythm.  ?   Pulses: Normal pulses.  ?   Heart sounds: Normal heart sounds. No murmur heard. ?Pulmonary:  ?   Effort: Pulmonary effort is normal.  ?   Breath sounds: No rhonchi or rales.  ?   Comments: Coarse breath sounds throughout ?Musculoskeletal:     ?   General: Swelling and tenderness present.  ?   Right lower leg: Edema present.  ?   Left lower leg: Edema present.  ?   Comments:  ?Circumferential edema to right lower leg along mid calf altitude ?Marked swelling of left ankle.   ?Skin: ?   General: Skin is warm and dry.  ?   Findings: Lesion present. No rash.  ?   Comments: Lesion medial R mid calf   ?Neurological:  ?   Mental Status: He is alert.  ?Psychiatric:     ?   Mood and Affect: Mood normal.     ?   Behavior: Behavior normal.  ? ?   ?Results for orders placed or performed during the hospital encounter of 03/31/22  ?Glucose, capillary  ?Result Value Ref Range  ? Glucose-Capillary 113 (H) 70 - 99 mg/dL  ? ?US Venous Img  Lower Unilateral Right (DVT) ?CLINICAL DATA:  Right lower extremity pain and swelling ? ?EXAM: ?RIGHT LOWER EXTREMITY VENOUS DOPPLER ULTRASOUND ? ?TECHNIQUE: ?Gray-scale sonography with compression, as well as color and duplex ?ultrasound, were performed to evaluate the deep venous system(s) ?from the level of the common femoral vein through the popliteal and ?proximal calf veins. ? ?COMPARISON:  None Available. ? ?FINDINGS: ?VENOUS ? ?Normal compressibility of the common femoral, superficial femoral, ?and popliteal veins, as well as  the visualized calf veins. ?Visualized portions of profunda femoral vein and great saphenous ?vein unremarkable. No filling defects to suggest DVT on grayscale or ?color Doppler imaging. Doppler waveforms show normal direction of ?venous flow, normal respiratory plasticity and response to ?augmentation. ? ?Limited views of the contralateral common femoral vein are ?unremarkable. ? ?OTHER ? ?Extensive superficial subcutaneous edema. ? ?Limitations: none ? ?IMPRESSION: ?No evidence of deep venous thrombosis. ? ?Superficial subcutaneous edema. ? ?Electronically Signed ?  By: Jacqulynn Cadet M.D. ?  On: 04/10/2022 14:51 ? ?Assessment & Plan:  ? ?Problem List Items Addressed This Visit   ? ? COPD, severe ([place])  ?  Severe oxygen and steroid dependent COPD, in continued smoker. Recent daliresp start.  ?This complicates care.  ? ?  ?  ? Prostate cancer ([place])  ? PVD (peripheral vascular disease) with claudication ([place])  ? Chronic respiratory failure with hypoxia ([facility])  ? Pain and swelling of right lower leg - Primary  ?  R leg with circumferential edema to mid calf, at same altitude as skin lesion to medial calf ?insect bite vs other. Will need to r/o DVT which I have ordered stat today.  ?Discussed further supportive measures including leg elevation, compression stocking use, salt avoidance and good water intake. ? ?  ?  ? Relevant Orders  ? US Venous Img Lower Unilateral Right (DVT)  (Completed)  ?  ? ?No orders of the defined types were placed in this encounter. ? ?Orders Placed This Encounter  ?Procedures  ? US Venous Img Lower Unilateral Right (DVT)  ?  HTA ?Epic ORDER ?[place] OFFICE-IN ORDER TO BE SEE

## 2022-04-10 NOTE — Telephone Encounter (Signed)
Pt has already seen Dr Darnell Level today. Sending note to Dr Darnell Level and Lattie Haw CMA. ? ? ? ?[facility] - Client ?TELEPHONE ADVICE RECORD ?[facility]? ?Patient ?Name: ?Parker H ?Martinez ?Gender: Male ?DOB: 1941-08-04 ?Age: 81 Y 73 M 11 ?D ?Return ?Phone ?Number: ?[phone] ?(Primary) ?Address: ?City/ ?State/ ?Zip: ?[place] ?[address] ?Client [facility] - Client ?Client Site [place] - Day ?Provider Ria Bush - MD ?Contact Type Call ?Who Is Calling Patient / Member / Family / Caregiver ?Call Type Triage / Clinical ?Relationship To Patient Self ?Return Phone Number [phone] (Primary) ?Chief Complaint Leg Swelling And Edema ?Reason for Call Symptomatic / Request for Health Information ?Initial Comment Caller states he is having severe swelling in his leg ?that is painful. ?Translation No ?Nurse Assessment ?Nurse: Doyle Askew, RN, Beth Date/Time Eilene Ghazi Time): 04/10/2022 9:55:53 AM ?Confirm and document reason for call. If ?symptomatic, describe symptoms. ?---Caller states he is having severe swelling in his leg ?(right) that is painful, hard. ?Does the patient have any new or worsening ?symptoms? ---Yes ?Will a triage be completed? ---Yes ?Related visit to physician within the last 2 weeks? ---No ?Does the PT have any chronic conditions? (i.e. ?diabetes, asthma, this includes High risk factors for ?pregnancy, etc.) ?---Yes ?List chronic conditions. ---COPD, lung CA ?Is this a behavioral health or substance abuse call? ---No ?Guidelines ?Guideline Title Affirmed Question Affirmed Notes Nurse Date/Time (Eastern ?Time) ?Leg Swelling and ?Edema ?[1] Thigh or calf pain ?AND [2] only 1 side ?AND [3] present > 1 ?hour ?Doyle Askew, RN, [facility] 04/10/2022 9:57:24 AM ?Disp. Time (Eastern ?Time) Disposition Final User ?04/10/2022 10:07:18 AM See HCP within 4 Hours (or ?PCP triage) ?Yes Doyle Askew, RN, [place] ?PLEASE NOTE: All timestamps contained within this report are represented  as [place] Standard Time. ?CONFIDENTIALTY NOTICE: This fax transmission is intended only for the addressee. It contains information that is legally privileged, confidential or ?otherwise protected from use or disclosure. If you are not the intended recipient, you are strictly prohibited from reviewing, disclosing, copying using ?or disseminating any of this information or taking any action in reliance on or regarding this information. If you have received this fax in error, please ?notify us immediately by telephone so that we can arrange for its return to [place]. Phone: [phone], Toll-Free: [phone], Fax: [phone] ?Page: 2 of 2 ?Call Id: [id number] ?Caller Disagree/Comply Comply ?Caller Understands Yes ?PreDisposition Call another nurse ?Care Advice Given Per Guideline ?SEE HCP (OR PCP TRIAGE) WITHIN 4 HOURS: * IF OFFICE WILL BE OPEN: You need to be seen within the next 3 or 4 ?hours. Call your doctor (or NP/PA) now or as soon as the office opens. * IF OFFICE WILL BE CLOSED AND NO PCP (PRIMARY ?CARE PROVIDER) SECOND-LEVEL TRIAGE: You need to be seen within the next 3 or 4 hours. A nearby Urgent [place] [facility]) is often a good source of care. Another choice is to go to the ED. Go sooner if you become worse. CARE ADVICE given per ?Leg Swelling and Edema (Adult) guideline. ?Comments ?User: Melene Muller, RN Date/Time Eilene Ghazi Time): 04/10/2022 10:00:51 AM ?O2 94% on 2L HR 97-102 ?User: Melene Muller, RN Date/Time Eilene Ghazi Time): 04/10/2022 10:06:54 AM ?Caller warm transferred to [facility] at PCP - appt scheduled for 12 this afternoon ?Referrals ?REFERRED TO PCP OFFIC ?

## 2022-04-10 NOTE — Patient Instructions (Signed)
Go to [place] location to get ultrasound of R leg done today. We will be in touch with results.  ?Elevate leg in interim.  ?

## 2022-04-11 ENCOUNTER — Encounter: Payer: Self-pay | Admitting: Radiation Oncology

## 2022-04-11 ENCOUNTER — Ambulatory Visit
Admission: RE | Admit: 2022-04-11 | Discharge: 2022-04-11 | Disposition: A | Discharge status: Home / Self Care | Payer: PPO | Source: Ambulatory Visit | Attending: Radiation Oncology | Admitting: Radiation Oncology

## 2022-04-11 VITALS — BP 98/60 | HR 109 | Temp 97.6°F | Resp 24 | Ht 68.0 in | Wt 153.7 lb

## 2022-04-11 DIAGNOSIS — Z7982 Long term (current) use of aspirin: Secondary | ICD-10-CM | POA: Diagnosis not present

## 2022-04-11 DIAGNOSIS — J9611 Chronic respiratory failure with hypoxia: Secondary | ICD-10-CM | POA: Diagnosis not present

## 2022-04-11 DIAGNOSIS — R918 Other nonspecific abnormal finding of lung field: Secondary | ICD-10-CM | POA: Diagnosis not present

## 2022-04-11 DIAGNOSIS — I739 Peripheral vascular disease, unspecified: Secondary | ICD-10-CM | POA: Insufficient documentation

## 2022-04-11 DIAGNOSIS — Z8546 Personal history of malignant neoplasm of prostate: Secondary | ICD-10-CM | POA: Insufficient documentation

## 2022-04-11 DIAGNOSIS — Z801 Family history of malignant neoplasm of trachea, bronchus and lung: Secondary | ICD-10-CM | POA: Diagnosis not present

## 2022-04-11 DIAGNOSIS — Z87891 Personal history of nicotine dependence: Secondary | ICD-10-CM | POA: Insufficient documentation

## 2022-04-11 DIAGNOSIS — I10 Essential (primary) hypertension: Secondary | ICD-10-CM | POA: Insufficient documentation

## 2022-04-11 DIAGNOSIS — Z79899 Other long term (current) drug therapy: Secondary | ICD-10-CM | POA: Insufficient documentation

## 2022-04-11 DIAGNOSIS — Z87442 Personal history of urinary calculi: Secondary | ICD-10-CM | POA: Insufficient documentation

## 2022-04-11 DIAGNOSIS — J449 Chronic obstructive pulmonary disease, unspecified: Secondary | ICD-10-CM | POA: Insufficient documentation

## 2022-04-11 DIAGNOSIS — K219 Gastro-esophageal reflux disease without esophagitis: Secondary | ICD-10-CM | POA: Diagnosis not present

## 2022-04-11 DIAGNOSIS — R911 Solitary pulmonary nodule: Secondary | ICD-10-CM

## 2022-04-11 DIAGNOSIS — Z8711 Personal history of peptic ulcer disease: Secondary | ICD-10-CM | POA: Diagnosis not present

## 2022-04-11 NOTE — Consult Note (Signed)
?NEW PATIENT EVALUATION ? ?Name: Parker Martinez  ?MRN: [id number]  ?Date:   04/11/2022     ?DOB: May 07, 1941 ? ? ?This 81 y.o. male patient presents to the clinic for initial evaluation of presumed stage I non-small cell lung cancer left upper lobe and patient with end-stage pulmonary disease. ? ?REFERRING PHYSICIAN: Ria Bush, MD ? ?CHIEF COMPLAINT:  ?Chief Complaint  ?Patient presents with  ? Lung Lesion  ?  Lung nodule  ? ? ?DIAGNOSIS: The encounter diagnosis was Lung nodule. ?  ?PREVIOUS INVESTIGATIONS:  ?Multiple PET CTs as well as CT scans reviewed ?Labs reviewed ?Clinical notes reviewed ? ?HPI: Patient is an 81 year old male who has been followed for an enlarging left upper lobe pulmonary lesion.  CT scan of the chest shows interval enlargement of the left upper lobe nodule along bandlike changes with bronchogenic neoplasm remaining the differential diagnosis.  There is no evidence of mediastinal or hilar adenopathy.  PET CT scan this month again showed hypermetabolic activity in the enlarging left upper lobe mass consistent with primary lung neoplasm.  No mediastinal hilar adenopathy was identified.  No other evidence of metastatic disease was noted.  Patient does have severe COPD stage IV with chronic respiratory failure with hypoxia.  Pulmonology does not want to risk of biopsy.  He is now referred to radiation collagen for consideration of treatment.  He is on continuous nasal oxygen.  He has a mild cough no hemoptysis or chest tightness. ? ?PLANNED TREATMENT REGIMEN: SBRT ? ?PAST MEDICAL HISTORY:  has a past medical history of Abnormal drug screen (10/2013, 03/2015), Allergic rhinitis, Arthritis, Carotid stenosis (09/11/2014), COPD (chronic obstructive pulmonary disease) ([place]), DDD (degenerative disc disease), Diverticulitis (2015), Diverticulosis (2008), Duodenitis, Esophageal stricture (2012), GERD (gastroesophageal reflux disease), Hemangioma, History of kidney stones (remote), HTN (hypertension),  Idiopathic anaphylaxis, PAD (peripheral artery disease) ([place]), Personal history of colonic adenomas (09/02/2013), Prostate cancer ([place]) (2011), Smoker, Status post dilatation of esophageal stricture, and Subclavian steal syndrome (10/12/2016).   ? ?PAST SURGICAL HISTORY:  ?Past Surgical History:  ?Procedure Laterality Date  ? BALLOON DILATION N/A 10/02/2014  ? Procedure: BALLOON DILATION;  Surgeon: Gatha Mayer, MD;  Location: [facility];  Service: Endoscopy;  Laterality: N/A;  ? CATARACT EXTRACTION W/PHACO Right 01/26/2020  ? Procedure: CATARACT EXTRACTION PHACO AND INTRAOCULAR LENS PLACEMENT (IOC) RIGHT 5.15  00:42.9;  Surgeon: [name], MD;  Location: [facility];  Service: Ophthalmology;  Laterality: Right;  ? CATARACT EXTRACTION W/PHACO Left 02/16/2020  ? Procedure: CATARACT EXTRACTION PHACO AND INTRAOCULAR LENS PLACEMENT (IOC) LEFT 3.87  00:45.4;  Surgeon: [name], MD;  Location: [facility];  Service: Ophthalmology;  Laterality: Left;  ? COLONOSCOPY  08/2013  ? 4 tubular adenomas, severe divertic, rpt 3 yrs [name])  ? COLONOSCOPY  10/2016  ? severe diverticulosis, no f/u recommended [name])  ? ESOPHAGOGASTRODUODENOSCOPY  10/2016  ? 2 rings dilated [name])  ? ESOPHAGOGASTRODUODENOSCOPY  01/2019  ? dilated esophageal webs [name])  ? ESOPHAGOGASTRODUODENOSCOPY (EGD) WITH PROPOFOL N/A 10/02/2014  ? WNL, mild esophagitis; Gatha Mayer, MD  ? [place]  01/2015  ? [name]  ? LUMBAR MICRODISCECTOMY Left 03/2019  ? L L4/L5 [name])  ? [place]  ? due to chronic dislocation  ? SPIROMETRY  10/2010  ? severe obstruction, FEV1 41%, ratio 0.46  ? TONSILLECTOMY  1944 ?  ? UPPER GASTROINTESTINAL ENDOSCOPY  06/08/2009  ? dilated esophageal stricture, duodenitis  ? virtual colonoscopy  2007  ?  [name], rpt rec 5 yrs  ? ? ?FAMILY HISTORY: family history includes Arthritis in his mother; Breast cancer in his daughter; Hyperlipidemia in his father;  Lung cancer in his father. ? ?SOCIAL HISTORY:  reports that he quit smoking about 3 months ago. His smoking use included cigarettes. He has a 60.00 pack-year smoking history. He has never used smokeless tobacco. He reports current alcohol use of about 14.0 standard drinks per week. He reports that he does not use drugs. ? ?ALLERGIES: Flonase [fluticasone propionate] ? ?MEDICATIONS:  ?Current Outpatient Medications  ?Medication Sig Dispense Refill  ? acetaminophen (TYLENOL) 325 MG tablet Take 1 tablet (325 mg total) by mouth every 6 (six) hours as needed for mild pain, fever or headache (or Fever >/= 101).    ? albuterol (PROVENTIL) (2.5 MG/3ML) 0.083% nebulizer solution INHALE 1 VIAL VIA NEBULIZER EVERY 4 HOURS AS NEEDED FOR WHEEZING OR SHORTNESS OF BREATH 375 mL 2  ? albuterol (VENTOLIN HFA) 108 (90 Base) MCG/ACT inhaler USE 2 PUFFS BY MOUTH EVERY 6 HOURS AS NEEDED FOR WHEEZING 18 each 2  ? alfuzosin (UROXATRAL) 10 MG 24 hr tablet Take 10 mg by mouth daily with breakfast.    ? aspirin 81 MG tablet Take 81 mg by mouth every morning.    ? atorvastatin (LIPITOR) 40 MG tablet TAKE 1 TABLET BY MOUTH EVERY DAY 90 tablet 2  ? Budeson-Glycopyrrol-Formoterol (BREZTRI AEROSPHERE) 160-9-4.8 MCG/ACT AERO Inhale 2 puffs into the lungs 2 (two) times daily. 10.7 g 11  ? Budeson-Glycopyrrol-Formoterol (BREZTRI AEROSPHERE) 160-9-4.8 MCG/ACT AERO Inhale 2 puffs into the lungs in the morning and at bedtime. 5.9 g 0  ? EPINEPHrine (EPIPEN 2-PAK) 0.3 mg/0.3 mL IJ SOAJ injection Inject 0.3 mLs (0.3 mg total) into the muscle as needed for anaphylaxis. 1 each 1  ? fexofenadine (ALLEGRA) 180 MG tablet Take 180 mg by mouth daily.    ? ipratropium (ATROVENT) 0.06 % nasal spray SPRAY 2 SPRAYS INTO EACH NOSTRIL 4 TIMES A DAY AS NEEDED FOR RHINITIS 15 mL 1  ? montelukast (SINGULAIR) 10 MG tablet TAKE 1 TABLET BY MOUTH EVERYDAY AT BEDTIME 90 tablet 3  ? Multiple Vitamin (MULTIVITAMIN) tablet Take 1 tablet by mouth daily.    ? pantoprazole  (PROTONIX) 40 MG tablet Take 1 tablet (40 mg total) by mouth 2 (two) times daily before a meal. 180 tablet 3  ? predniSONE (DELTASONE) 10 MG tablet TAKE 1 TABLET (10 MG TOTAL) BY MOUTH DAILY WITH BREAKFAST. 30 tablet 6  ? Roflumilast 250 MCG TABS TAKE 1 TABLET BY MOUTH EVERY DAY 28 tablet 2  ? traMADol (ULTRAM) 50 MG tablet TAKE 1/2 TO 1 TAB BY MOUTH TWICE A DAY AS NEEDED FOR MODERATE PAIN FOR UP TO 5 DAYS 14 tablet 1  ? vitamin B-12 (CYANOCOBALAMIN) 500 MCG tablet Take 500 mcg by mouth every morning.     ? ?No current facility-administered medications for this encounter.  ? ? ?ECOG PERFORMANCE STATUS:  0 - Asymptomatic ? ?REVIEW OF SYSTEMS: Patient has bronchiectasis COPD extreme dyspnea on exertion continuous O2 use ?Patient denies any weight loss, fatigue, weakness, fever, chills or night sweats. Patient denies any loss of vision, blurred vision. Patient denies any ringing  of the ears or hearing loss. No irregular heartbeat. Patient denies heart murmur or history of fainting. Patient denies any chest pain or pain radiating to her upper extremities. Patient denies any shortness of breath, difficulty breathing at night, cough or hemoptysis. Patient denies any swelling in the lower legs. Patient  denies any nausea vomiting, vomiting of blood, or coffee ground material in the vomitus. Patient denies any stomach pain. Patient states has had normal bowel movements no significant constipation or diarrhea. Patient denies any dysuria, hematuria or significant nocturia. Patient denies any problems walking, swelling in the joints or loss of balance. Patient denies any skin changes, loss of hair or loss of weight. Patient denies any excessive worrying or anxiety or significant depression. Patient denies any problems with insomnia. Patient denies excessive thirst, polyuria, polydipsia. Patient denies any swollen glands, patient denies easy bruising or easy bleeding. Patient denies any recent infections, allergies or URI.  Patient "s visual fields have not changed significantly in recent time. ?  ?PHYSICAL EXAM: ?BP 98/60 (BP Location: Left Arm, Patient Position: Sitting, Cuff Size: Normal)   Pulse (!) 109   Temp 97.6 ?F (36.4 ?C)

## 2022-04-12 ENCOUNTER — Encounter: Payer: Self-pay | Admitting: Family Medicine

## 2022-04-12 DIAGNOSIS — M79661 Pain in right lower leg: Secondary | ICD-10-CM | POA: Insufficient documentation

## 2022-04-12 MED ORDER — CEPHALEXIN 500 MG PO CAPS: 500.0000 mg | ORAL_CAPSULE | Freq: Four times a day (QID) | ORAL | 0 refills | Status: DC

## 2022-04-12 NOTE — Telephone Encounter (Signed)
See other mychart message.

## 2022-04-12 NOTE — Telephone Encounter (Signed)
Spoke with wife.  ?Worse pain, swelling and warmth of R leg without significant erythema.  ?Recent venous US negative for DVT.  ?Concern for developing cellulitis - Rx keflex 500mg  QID course x10 days. Discussed if not improving, rec OV for further evaluation.  ?

## 2022-04-12 NOTE — Telephone Encounter (Signed)
See my chart message

## 2022-04-12 NOTE — Telephone Encounter (Signed)
Parker Martinez called in re: leg pain, states swelling is worse and it is hot to the touch ? ?It is not red where the lesion was observed at his appointment, the redness is lower and his leg is more swollen ? ?He is going to have his wife take a photo and send it to MD through My chart ? ?Patient will await to hear follow-up from MD ?

## 2022-04-12 NOTE — Assessment & Plan Note (Addendum)
Severe oxygen and steroid dependent COPD, in continued smoker. Recent daliresp start.  ?This complicates care.  ?

## 2022-04-12 NOTE — Assessment & Plan Note (Signed)
R leg with circumferential edema to mid calf, at same altitude as skin lesion to medial calf ?insect bite vs other. Will need to r/o DVT which I have ordered stat today.  ?Discussed further supportive measures including leg elevation, compression stocking use, salt avoidance and good water intake. ?

## 2022-04-17 DIAGNOSIS — R0600 Dyspnea, unspecified: Secondary | ICD-10-CM | POA: Diagnosis not present

## 2022-04-18 DIAGNOSIS — J449 Chronic obstructive pulmonary disease, unspecified: Secondary | ICD-10-CM | POA: Diagnosis not present

## 2022-04-19 ENCOUNTER — Ambulatory Visit
Admission: RE | Admit: 2022-04-19 | Discharge: 2022-04-19 | Disposition: A | Discharge status: Home / Self Care | Payer: PPO | Source: Ambulatory Visit | Attending: Radiation Oncology | Admitting: Radiation Oncology

## 2022-04-19 DIAGNOSIS — R918 Other nonspecific abnormal finding of lung field: Secondary | ICD-10-CM | POA: Diagnosis not present

## 2022-04-19 DIAGNOSIS — Z87891 Personal history of nicotine dependence: Secondary | ICD-10-CM | POA: Diagnosis not present

## 2022-04-19 DIAGNOSIS — C3412 Malignant neoplasm of upper lobe, left bronchus or lung: Secondary | ICD-10-CM | POA: Insufficient documentation

## 2022-04-19 DIAGNOSIS — Z510 Encounter for antineoplastic radiation therapy: Secondary | ICD-10-CM | POA: Diagnosis not present

## 2022-04-21 DIAGNOSIS — Z9981 Dependence on supplemental oxygen: Secondary | ICD-10-CM | POA: Diagnosis not present

## 2022-04-21 DIAGNOSIS — J9610 Chronic respiratory failure, unspecified whether with hypoxia or hypercapnia: Secondary | ICD-10-CM | POA: Diagnosis not present

## 2022-04-21 DIAGNOSIS — F330 Major depressive disorder, recurrent, mild: Secondary | ICD-10-CM | POA: Diagnosis not present

## 2022-04-21 DIAGNOSIS — Z515 Encounter for palliative care: Secondary | ICD-10-CM | POA: Diagnosis not present

## 2022-04-21 DIAGNOSIS — J449 Chronic obstructive pulmonary disease, unspecified: Secondary | ICD-10-CM | POA: Diagnosis not present

## 2022-04-21 DIAGNOSIS — R1319 Other dysphagia: Secondary | ICD-10-CM | POA: Diagnosis not present

## 2022-04-21 DIAGNOSIS — I10 Essential (primary) hypertension: Secondary | ICD-10-CM | POA: Diagnosis not present

## 2022-04-21 DIAGNOSIS — Z720 Tobacco use: Secondary | ICD-10-CM | POA: Diagnosis not present

## 2022-04-21 DIAGNOSIS — Z8719 Personal history of other diseases of the digestive system: Secondary | ICD-10-CM | POA: Diagnosis not present

## 2022-04-21 DIAGNOSIS — C3412 Malignant neoplasm of upper lobe, left bronchus or lung: Secondary | ICD-10-CM | POA: Diagnosis not present

## 2022-04-24 ENCOUNTER — Telehealth: Payer: Self-pay

## 2022-04-24 NOTE — Telephone Encounter (Signed)
Patient was told to call back this week to let us know how he was doing with his leg. Swelling of his leg is around 20-30% better, some redness present and pain is a little worse. He finished his antibiotics. No fever.

## 2022-04-25 ENCOUNTER — Encounter: Payer: Self-pay | Admitting: Family Medicine

## 2022-04-25 ENCOUNTER — Ambulatory Visit (INDEPENDENT_AMBULATORY_CARE_PROVIDER_SITE_OTHER): Payer: PPO | Admitting: Family Medicine

## 2022-04-25 ENCOUNTER — Telehealth: Payer: Self-pay | Admitting: Family Medicine

## 2022-04-25 VITALS — BP 120/64 | HR 112 | Temp 98.2°F | Ht 68.0 in | Wt 153.0 lb

## 2022-04-25 DIAGNOSIS — M7989 Other specified soft tissue disorders: Secondary | ICD-10-CM

## 2022-04-25 DIAGNOSIS — M79661 Pain in right lower leg: Secondary | ICD-10-CM | POA: Diagnosis not present

## 2022-04-25 DIAGNOSIS — E785 Hyperlipidemia, unspecified: Secondary | ICD-10-CM | POA: Diagnosis not present

## 2022-04-25 DIAGNOSIS — I739 Peripheral vascular disease, unspecified: Secondary | ICD-10-CM | POA: Diagnosis not present

## 2022-04-25 DIAGNOSIS — J449 Chronic obstructive pulmonary disease, unspecified: Secondary | ICD-10-CM

## 2022-04-25 NOTE — Telephone Encounter (Signed)
Noted. Glad swelling is some better.  Does he feel overall improved after antibiotic course?  If ongoing, recommend re evaluation in office to best determine next steps. Can he come today at 4:30pm?

## 2022-04-25 NOTE — Assessment & Plan Note (Signed)
Anticipate he had a component of cellulitis which has improved with Keflex course as evidenced by decreased edema.  Now symptoms are more consistent with peripheral arterial disease progression, concern for resting pain.  I will further evaluate with lab work today rule out gout, ongoing infection, and will check right leg x-ray.  I will also order repeat ABIs and ask for expedited referral back to vascular surgery for reevaluation of arterial circulation.  I did advise patient not to exert himself until evaluated by vascular surgery.

## 2022-04-25 NOTE — Telephone Encounter (Signed)
Seen today. 

## 2022-04-25 NOTE — Telephone Encounter (Signed)
See 04/24/22 phn note.

## 2022-04-25 NOTE — Telephone Encounter (Addendum)
Spoke with pt asking how he's feeling.  States the swelling is about 40% better but still has pain/soreness to touch.  Finished abx on 03/23/22.  Offered 4:30 OV today for re-evaluation.  Pt agrees and will be here at 4:15. Fyi to Dr. Darnell Level.  Will have pt added to schedule.

## 2022-04-25 NOTE — Patient Instructions (Addendum)
Labs and xray today.  We will be in touch with results. We will also refer you for repeat arterial circulation evaluation.

## 2022-04-25 NOTE — Progress Notes (Signed)
Patient ID: Parker Martinez, male    DOB: 11-Jan-1941, 81 y.o.   MRN: [id number]  This visit was conducted in person.  BP 120/64   Pulse (!) 112   Temp 98.2 F (36.8 C) (Temporal)   Ht 5\' 8"  (1.727 m)   Wt 153 lb (69.4 kg)   SpO2 95% Comment: 3 L  BMI 23.26 kg/m    CC: R leg soreness  Subjective:   HPI: Parker Martinez is a 81 y.o. male presenting on 04/25/2022 for Leg Pain (C/o ongoing R leg soreness to touch and prickly feeling after completing abx on 03/23/22.  Also, c/o of severe itching. Swelling is going down. )   See prior note for details. Seen 04/10/2022 with 1 to 2-day history of worsening right leg pain and swelling.  Sent for stat RLE venous ultrasound, egative for DVT (04/10/2022). Given worsening pain, swelling, redness, started on keflex QID 10d course to treat possible developing cellulitis. He notes swelling is about 40% improved after antibiotic course, but notes ongoing soreness to touch throughout lower leg as well as tingling sensation to lower leg.   Notes new R leg claudication symptoms that started with swelling. Notes some resting pain.  Last saw vascular surgeon Scot Dock) 2019, at that time had noted stable PAD with claudication. H/o gout, this feels different.   Recent new dx likely lung cancer by PET scan, limited treatment options given severe COPD, has been referred to rad onc to consider palliative SBRT, to start in the next few weeks.      Relevant past medical, surgical, family and social history reviewed and updated as indicated. Interim medical history since our last visit reviewed. Allergies and medications reviewed and updated. Outpatient Medications Prior to Visit  Medication Sig Dispense Refill   acetaminophen (TYLENOL) 325 MG tablet Take 1 tablet (325 mg total) by mouth every 6 (six) hours as needed for mild pain, fever or headache (or Fever >/= 101).     albuterol (PROVENTIL) (2.5 MG/3ML) 0.083% nebulizer solution INHALE 1 VIAL VIA NEBULIZER  EVERY 4 HOURS AS NEEDED FOR WHEEZING OR SHORTNESS OF BREATH 375 mL 2   albuterol (VENTOLIN HFA) 108 (90 Base) MCG/ACT inhaler USE 2 PUFFS BY MOUTH EVERY 6 HOURS AS NEEDED FOR WHEEZING 18 each 2   alfuzosin (UROXATRAL) 10 MG 24 hr tablet Take 10 mg by mouth daily with breakfast.     aspirin 81 MG tablet Take 81 mg by mouth every morning.     atorvastatin (LIPITOR) 40 MG tablet TAKE 1 TABLET BY MOUTH EVERY DAY 90 tablet 2   Budeson-Glycopyrrol-Formoterol (BREZTRI AEROSPHERE) 160-9-4.8 MCG/ACT AERO Inhale 2 puffs into the lungs 2 (two) times daily. 10.7 g 11   Budeson-Glycopyrrol-Formoterol (BREZTRI AEROSPHERE) 160-9-4.8 MCG/ACT AERO Inhale 2 puffs into the lungs in the morning and at bedtime. 5.9 g 0   EPINEPHrine (EPIPEN 2-PAK) 0.3 mg/0.3 mL IJ SOAJ injection Inject 0.3 mLs (0.3 mg total) into the muscle as needed for anaphylaxis. 1 each 1   fexofenadine (ALLEGRA) 180 MG tablet Take 180 mg by mouth daily.     ipratropium (ATROVENT) 0.06 % nasal spray SPRAY 2 SPRAYS INTO EACH NOSTRIL 4 TIMES A DAY AS NEEDED FOR RHINITIS 15 mL 1   montelukast (SINGULAIR) 10 MG tablet TAKE 1 TABLET BY MOUTH EVERYDAY AT BEDTIME 90 tablet 3   Multiple Vitamin (MULTIVITAMIN) tablet Take 1 tablet by mouth daily.     pantoprazole (PROTONIX) 40 MG tablet Take 1 tablet (40 mg  total) by mouth 2 (two) times daily before a meal. 180 tablet 3   predniSONE (DELTASONE) 10 MG tablet TAKE 1 TABLET (10 MG TOTAL) BY MOUTH DAILY WITH BREAKFAST. 30 tablet 6   Roflumilast 250 MCG TABS TAKE 1 TABLET BY MOUTH EVERY DAY 28 tablet 2   traMADol (ULTRAM) 50 MG tablet TAKE 1/2 TO 1 TAB BY MOUTH TWICE A DAY AS NEEDED FOR MODERATE PAIN FOR UP TO 5 DAYS 14 tablet 1   vitamin B-12 (CYANOCOBALAMIN) 500 MCG tablet Take 500 mcg by mouth every morning.      cephALEXin (KEFLEX) 500 MG capsule Take 1 capsule (500 mg total) by mouth 4 (four) times daily. 40 capsule 0   No facility-administered medications prior to visit.     Per HPI unless specifically  indicated in ROS section below Review of Systems  Objective:  BP 120/64   Pulse (!) 112   Temp 98.2 F (36.8 C) (Temporal)   Ht 5\' 8"  (1.727 m)   Wt 153 lb (69.4 kg)   SpO2 95% Comment: 3 L  BMI 23.26 kg/m   Wt Readings from Last 3 Encounters:  04/25/22 153 lb (69.4 kg)  04/11/22 153 lb 11.2 oz (69.7 kg)  04/10/22 152 lb 6 oz (69.1 kg)      Physical Exam Vitals and nursing note reviewed.  Constitutional:      Appearance: Normal appearance. He is not ill-appearing.     Comments: Supplemental oxygen through [place] in place  Musculoskeletal:        General: Tenderness present. Normal range of motion.     Right lower leg: No edema.     Left lower leg: No edema.     Comments:  Diminished R pedal pulses Significantly improved RLE soft tissue edema however remains tender to palpation throughout right leg.    Skin:    General: Skin is warm and dry.     Findings: No rash.  Neurological:     Mental Status: He is alert.  Psychiatric:        Mood and Affect: Mood normal.        Behavior: Behavior normal.      Results for orders placed or performed during the hospital encounter of 03/31/22  Glucose, capillary  Result Value Ref Range   Glucose-Capillary 113 (H) 70 - 99 mg/dL   ABI 10/2020 - RABI 0.64 moderate disease, LABI 0.93 mild disease, BTBI abnormal   Assessment & Plan:   Problem List Items Addressed This Visit     COPD, severe ([place])   Relevant Orders   Ambulatory referral to Vascular Surgery   PVD (peripheral vascular disease) with claudication ([place])   Relevant Orders   Ambulatory referral to Vascular Surgery   VAS [place] ABI WITH/WO TBI   Dyslipidemia   Relevant Orders   Ambulatory referral to Vascular Surgery   Pain and swelling of right lower leg - Primary    Anticipate he had a component of cellulitis which has improved with Keflex course as evidenced by decreased edema.  Now symptoms are more consistent with peripheral arterial disease progression, concern for  resting pain.  I will further evaluate with lab work today rule out gout, ongoing infection, and will check right leg x-ray.  I will also order repeat ABIs and ask for expedited referral back to vascular surgery for reevaluation of arterial circulation.  I did advise patient not to exert himself until evaluated by vascular surgery.       Relevant Orders  Sedimentation rate   CBC with Differential/Platelet   Uric acid   Basic metabolic panel   C-reactive protein   DG Tibia/Fibula Right   Ambulatory referral to Vascular Surgery   VAS [place] ABI WITH/WO TBI     No orders of the defined types were placed in this encounter.  Orders Placed This Encounter  Procedures   DG Tibia/Fibula Right    Standing Status:   Future    Standing Expiration Date:   04/26/2023    Order Specific Question:   Reason for Exam (SYMPTOM  OR DIAGNOSIS REQUIRED)    Answer:   right leg pain    Order Specific Question:   Preferred imaging location?    Answer:   [facility]   Sedimentation rate   CBC with Differential/Platelet   Uric acid   Basic metabolic panel   C-reactive protein   Ambulatory referral to Vascular Surgery    Referral Priority:   Urgent    Referral Type:   Surgical    Referral Reason:   Specialty Services Required    Requested Specialty:   Vascular Surgery    Number of Visits Requested:   1    Patient Instructions  Labs and xray today.  We will be in touch with results. We will also refer you for repeat arterial circulation evaluation.   Follow up plan: Return if symptoms worsen or fail to improve.  Ria Bush, MD

## 2022-04-25 NOTE — Telephone Encounter (Signed)
Pt has called yesterday and again today, needs to speak with the nurse. Please return the call when possible. Thanks  Callback Number: [id number]

## 2022-04-26 ENCOUNTER — Ambulatory Visit (INDEPENDENT_AMBULATORY_CARE_PROVIDER_SITE_OTHER)
Admission: RE | Admit: 2022-04-26 | Discharge: 2022-04-26 | Disposition: A | Discharge status: Home / Self Care | Payer: PPO | Source: Ambulatory Visit | Attending: Family Medicine | Admitting: Family Medicine

## 2022-04-26 DIAGNOSIS — M79661 Pain in right lower leg: Secondary | ICD-10-CM | POA: Diagnosis not present

## 2022-04-26 DIAGNOSIS — R918 Other nonspecific abnormal finding of lung field: Secondary | ICD-10-CM | POA: Diagnosis not present

## 2022-04-26 DIAGNOSIS — M79604 Pain in right leg: Secondary | ICD-10-CM | POA: Diagnosis not present

## 2022-04-26 DIAGNOSIS — Z510 Encounter for antineoplastic radiation therapy: Secondary | ICD-10-CM | POA: Diagnosis not present

## 2022-04-26 DIAGNOSIS — M7989 Other specified soft tissue disorders: Secondary | ICD-10-CM

## 2022-04-26 DIAGNOSIS — Z87891 Personal history of nicotine dependence: Secondary | ICD-10-CM | POA: Diagnosis not present

## 2022-04-26 LAB — URIC ACID: Uric Acid, Serum: 6.8 mg/dL (ref 4.0–7.8)

## 2022-04-26 LAB — BASIC METABOLIC PANEL
BUN: 14 mg/dL (ref 6–23)
CO2: 28 mEq/L (ref 19–32)
Calcium: 9.5 mg/dL (ref 8.4–10.5)
Chloride: 102 mEq/L (ref 96–112)
Creatinine, Ser: 0.84 mg/dL (ref 0.40–1.50)
GFR: 81.56 mL/min (ref >60.00)
Glucose, Bld: 95 mg/dL (ref 70–99)
Potassium: 4.1 mEq/L (ref 3.5–5.1)
Sodium: 141 mEq/L (ref 135–145)

## 2022-04-26 LAB — CBC WITH DIFFERENTIAL/PLATELET
Basophils Absolute: 0 10*3/uL (ref 0.0–0.1)
Basophils Relative: 0.5 % (ref 0.0–3.0)
Eosinophils Absolute: 0 10*3/uL (ref 0.0–0.7)
Eosinophils Relative: 0.1 % (ref 0.0–5.0)
HCT: 34.6 % — ABNORMAL LOW (ref 39.0–52.0)
Hemoglobin: 11.8 g/dL — ABNORMAL LOW (ref 13.0–17.0)
Lymphocytes Relative: 11.9 % — ABNORMAL LOW (ref 12.0–46.0)
Lymphs Abs: 1.1 10*3/uL (ref 0.7–4.0)
MCHC: 34.1 g/dL (ref 30.0–36.0)
MCV: 97.1 fl (ref 78.0–100.0)
Monocytes Absolute: 0.7 10*3/uL (ref 0.1–1.0)
Monocytes Relative: 7.7 % (ref 3.0–12.0)
Neutro Abs: 7.3 10*3/uL (ref 1.4–7.7)
Neutrophils Relative %: 79.8 % — ABNORMAL HIGH (ref 43.0–77.0)
Platelets: 226 10*3/uL (ref 150.0–400.0)
RBC: 3.57 Mil/uL — ABNORMAL LOW (ref 4.22–5.81)
RDW: 13 % (ref 11.5–15.5)
WBC: 9.1 10*3/uL (ref 4.0–10.5)

## 2022-04-26 LAB — SEDIMENTATION RATE: Sed Rate: 2 mm/hr (ref 0–20)

## 2022-04-26 LAB — C-REACTIVE PROTEIN: CRP: <1 mg/dL (ref 0.5–20.0)

## 2022-05-02 ENCOUNTER — Ambulatory Visit
Admission: RE | Admit: 2022-05-02 | Discharge: 2022-05-02 | Disposition: A | Discharge status: Home / Self Care | Payer: PPO | Source: Ambulatory Visit | Attending: Radiation Oncology | Admitting: Radiation Oncology

## 2022-05-02 ENCOUNTER — Other Ambulatory Visit: Payer: Self-pay

## 2022-05-02 DIAGNOSIS — Z510 Encounter for antineoplastic radiation therapy: Secondary | ICD-10-CM | POA: Diagnosis not present

## 2022-05-02 LAB — RAD ONC ARIA SESSION SUMMARY
Course Elapsed Days: 0
Plan Fractions Treated to Date: 1
Plan Prescribed Dose Per Fraction: 12 Gy
Plan Total Fractions Prescribed: 5
Plan Total Prescribed Dose: 60 Gy
Reference Point Dosage Given to Date: 12 Gy
Reference Point Session Dosage Given: 12 Gy
Session Number: 1

## 2022-05-04 ENCOUNTER — Ambulatory Visit (INDEPENDENT_AMBULATORY_CARE_PROVIDER_SITE_OTHER)
Admission: RE | Admit: 2022-05-04 | Discharge: 2022-05-04 | Disposition: A | Discharge status: Home / Self Care | Payer: PPO | Source: Ambulatory Visit | Attending: Family Medicine | Admitting: Family Medicine

## 2022-05-04 ENCOUNTER — Ambulatory Visit
Admission: RE | Admit: 2022-05-04 | Discharge: 2022-05-04 | Disposition: A | Discharge status: Home / Self Care | Payer: PPO | Source: Ambulatory Visit | Attending: Radiation Oncology | Admitting: Radiation Oncology

## 2022-05-04 ENCOUNTER — Other Ambulatory Visit: Payer: Self-pay

## 2022-05-04 ENCOUNTER — Other Ambulatory Visit: Payer: Self-pay | Admitting: Pulmonary Disease

## 2022-05-04 DIAGNOSIS — M79661 Pain in right lower leg: Secondary | ICD-10-CM

## 2022-05-04 DIAGNOSIS — M7989 Other specified soft tissue disorders: Secondary | ICD-10-CM

## 2022-05-04 DIAGNOSIS — I739 Peripheral vascular disease, unspecified: Secondary | ICD-10-CM | POA: Insufficient documentation

## 2022-05-04 LAB — RAD ONC ARIA SESSION SUMMARY
Course Elapsed Days: 2
Plan Fractions Treated to Date: 2
Plan Prescribed Dose Per Fraction: 12 Gy
Plan Total Fractions Prescribed: 5
Plan Total Prescribed Dose: 60 Gy
Reference Point Dosage Given to Date: 24 Gy
Reference Point Session Dosage Given: 12 Gy
Session Number: 2

## 2022-05-08 ENCOUNTER — Other Ambulatory Visit: Payer: Self-pay | Admitting: Family Medicine

## 2022-05-09 ENCOUNTER — Other Ambulatory Visit: Payer: Self-pay

## 2022-05-09 ENCOUNTER — Ambulatory Visit
Admission: RE | Admit: 2022-05-09 | Discharge: 2022-05-09 | Disposition: A | Discharge status: Home / Self Care | Payer: PPO | Source: Ambulatory Visit | Attending: Radiation Oncology | Admitting: Radiation Oncology

## 2022-05-09 DIAGNOSIS — M79661 Pain in right lower leg: Secondary | ICD-10-CM | POA: Diagnosis not present

## 2022-05-09 LAB — RAD ONC ARIA SESSION SUMMARY
Course Elapsed Days: 7
Plan Fractions Treated to Date: 3
Plan Prescribed Dose Per Fraction: 12 Gy
Plan Total Fractions Prescribed: 5
Plan Total Prescribed Dose: 60 Gy
Reference Point Dosage Given to Date: 36 Gy
Reference Point Session Dosage Given: 12 Gy
Session Number: 3

## 2022-05-09 NOTE — Telephone Encounter (Signed)
Name of Medication: Tramadol Name of Pharmacy: [facility] Last Fill or Written Date and Quantity: 02/10/22, #14 Last Office Visit and Type: 04/25/22, pain/swelling R leg Next Office Visit and Type: 05/10/22, 6 mo f/u Last Controlled Substance Agreement Date: 09/20/16 Last UDS: 09/20/16

## 2022-05-10 ENCOUNTER — Encounter (HOSPITAL_COMMUNITY): Payer: PPO

## 2022-05-10 ENCOUNTER — Encounter: Payer: Self-pay | Admitting: Family Medicine

## 2022-05-10 ENCOUNTER — Ambulatory Visit (INDEPENDENT_AMBULATORY_CARE_PROVIDER_SITE_OTHER): Payer: PPO | Admitting: Family Medicine

## 2022-05-10 VITALS — BP 122/64 | HR 97 | Temp 97.0°F | Ht 68.0 in | Wt 153.5 lb

## 2022-05-10 DIAGNOSIS — M79661 Pain in right lower leg: Secondary | ICD-10-CM

## 2022-05-10 DIAGNOSIS — I739 Peripheral vascular disease, unspecified: Secondary | ICD-10-CM | POA: Diagnosis not present

## 2022-05-10 DIAGNOSIS — C3490 Malignant neoplasm of unspecified part of unspecified bronchus or lung: Secondary | ICD-10-CM

## 2022-05-10 DIAGNOSIS — M7989 Other specified soft tissue disorders: Secondary | ICD-10-CM

## 2022-05-10 DIAGNOSIS — T782XXS Anaphylactic shock, unspecified, sequela: Secondary | ICD-10-CM

## 2022-05-10 MED ORDER — GABAPENTIN 100 MG PO CAPS: 100.0000 mg | ORAL_CAPSULE | Freq: Every day | ORAL | 1 refills | Status: DC

## 2022-05-10 MED ORDER — EPINEPHRINE 0.3 MG/0.3ML IJ SOAJ: 0.3000 mg | INTRAMUSCULAR | 1 refills | Status: AC | PRN

## 2022-05-10 MED ORDER — TRAMADOL HCL 50 MG PO TABS: 50.0000 mg | ORAL_TABLET | Freq: Two times a day (BID) | ORAL | 1 refills | Status: DC | PRN

## 2022-05-10 NOTE — Assessment & Plan Note (Addendum)
Upcoming [facility] appt next week to determine if R leg pain could be stemming from PAD/claudication/resting pain.  Recommend against compression stocking use.

## 2022-05-10 NOTE — Assessment & Plan Note (Addendum)
Improved but persistent swelling with pain to R lower leg, spares foot. Today describes neuropathic component including paresthesias/numbness to lower leg. Rx gabapentin with sedation/dizziness precautions.  He already regularly takes vitamin B12 535mcg daily.  Discussed possible association of alcohol use with neuropathy.  If reassuring upcoming PAD evaluation next month, he will let me know to return for further neuropathy evaluation/labwork.

## 2022-05-10 NOTE — Assessment & Plan Note (Addendum)
Receiving SBRT through [facility] ([place]).

## 2022-05-10 NOTE — Progress Notes (Signed)
Patient ID: Parker Martinez, male    DOB: December 15, 1940, 81 y.o.   MRN: [id number]  This visit was conducted in person.  BP 122/64   Pulse 97   Temp (!) 97 F (36.1 C) (Temporal)   Ht 5\' 8"  (1.727 m)   Wt 153 lb 8 oz (69.6 kg)   SpO2 96% Comment: 2 L  BMI 23.34 kg/m    CC: 6 mo f/u visit  Subjective:   HPI: Parker Martinez is a 81 y.o. male presenting on 05/10/2022 for Follow-up (Here for 6 mo f/u.)   See prior notes for details.  1 month of R leg pain and swelling. Workup included RLE venous US negative for DVT. Treated for cellulitis component with keflex course with some improvement. Given new R leg claudication associated with resting pain, ABIs updated and referred back to [facility] for further evaluation.  ABI 05/2022 - R ABI 0.66, L ABI noncompressible (05/2022), B TBI abnormal ABI 10/2020 - R ABI 0.64 moderate disease, L ABI 0.93 mild disease, BTBI abnormal   Notes ongoing constant throbbing and stabbing pain to anterior and medial lower leg. Swelling has mostly improved. No fevers/chills, significant redness or warmth. Notes persistent paresthesias, numbness to affected area lower right leg, not affecting foot. No significant burning pain.  No diabetes history. 2 alcoholic drinks/day. He regularly takes B12 541mcg daily.   Upcoming appt with [facility] Dr Scot Dock 05/18/2022.   Recent new dx likely lung cancer by PET scan, limited treatment options given severe COPD, started palliative SBRT through [facility]. Seems to be tolerating well but notes mild fatigue and increased cough.   He notes some ongoing difficulty with dysphagia in history of previous esoph rings - continues PPI BID with pepcid PRN.      Relevant past medical, surgical, family and social history reviewed and updated as indicated. Interim medical history since our last visit reviewed. Allergies and medications reviewed and updated. Outpatient Medications Prior to Visit  Medication Sig Dispense Refill   acetaminophen  (TYLENOL) 325 MG tablet Take 1 tablet (325 mg total) by mouth every 6 (six) hours as needed for mild pain, fever or headache (or Fever >/= 101).     albuterol (PROVENTIL) (2.5 MG/3ML) 0.083% nebulizer solution INHALE 1 VIAL VIA NEBULIZER EVERY 4 HOURS AS NEEDED FOR WHEEZING OR SHORTNESS OF BREATH 375 mL 2   albuterol (VENTOLIN HFA) 108 (90 Base) MCG/ACT inhaler USE 2 PUFFS BY MOUTH EVERY 6 HOURS AS NEEDED FOR WHEEZING 18 each 2   alfuzosin (UROXATRAL) 10 MG 24 hr tablet Take 10 mg by mouth daily with breakfast.     aspirin 81 MG tablet Take 81 mg by mouth every morning.     atorvastatin (LIPITOR) 40 MG tablet TAKE 1 TABLET BY MOUTH EVERY DAY 90 tablet 2   Budeson-Glycopyrrol-Formoterol (BREZTRI AEROSPHERE) 160-9-4.8 MCG/ACT AERO Inhale 2 puffs into the lungs 2 (two) times daily. 10.7 g 11   Budeson-Glycopyrrol-Formoterol (BREZTRI AEROSPHERE) 160-9-4.8 MCG/ACT AERO Inhale 2 puffs into the lungs in the morning and at bedtime. 5.9 g 0   fexofenadine (ALLEGRA) 180 MG tablet Take 180 mg by mouth daily.     ipratropium (ATROVENT) 0.06 % nasal spray SPRAY 2 SPRAYS INTO EACH NOSTRIL 4 TIMES A DAY AS NEEDED FOR RHINITIS 15 mL 1   montelukast (SINGULAIR) 10 MG tablet TAKE 1 TABLET BY MOUTH EVERYDAY AT BEDTIME 90 tablet 3   Multiple Vitamin (MULTIVITAMIN) tablet Take 1 tablet by mouth daily.  pantoprazole (PROTONIX) 40 MG tablet Take 1 tablet (40 mg total) by mouth 2 (two) times daily before a meal. 180 tablet 3   predniSONE (DELTASONE) 10 MG tablet TAKE 1 TABLET (10 MG TOTAL) BY MOUTH DAILY WITH BREAKFAST. 30 tablet 6   Roflumilast 250 MCG TABS TAKE 1 TABLET BY MOUTH EVERY DAY 28 tablet 2   vitamin B-12 (CYANOCOBALAMIN) 500 MCG tablet Take 500 mcg by mouth every morning.      EPINEPHrine (EPIPEN 2-PAK) 0.3 mg/0.3 mL IJ SOAJ injection Inject 0.3 mLs (0.3 mg total) into the muscle as needed for anaphylaxis. 1 each 1   traMADol (ULTRAM) 50 MG tablet TAKE 1/2 TO 1 TAB BY MOUTH TWICE A DAY AS NEEDED FOR  MODERATE PAIN FOR UP TO 5 DAYS 14 tablet 1   No facility-administered medications prior to visit.     Per HPI unless specifically indicated in ROS section below Review of Systems  Objective:  BP 122/64   Pulse 97   Temp (!) 97 F (36.1 C) (Temporal)   Ht 5\' 8"  (1.727 m)   Wt 153 lb 8 oz (69.6 kg)   SpO2 96% Comment: 2 L  BMI 23.34 kg/m   Wt Readings from Last 3 Encounters:  05/10/22 153 lb 8 oz (69.6 kg)  04/25/22 153 lb (69.4 kg)  04/11/22 153 lb 11.2 oz (69.7 kg)      Physical Exam Vitals and nursing note reviewed.  Constitutional:      Appearance: Normal appearance. He is not ill-appearing.  Cardiovascular:     Rate and Rhythm: Normal rate and regular rhythm.     Pulses: Normal pulses.     Heart sounds: Normal heart sounds. No murmur heard. Pulmonary:     Effort: Pulmonary effort is normal. No respiratory distress.     Breath sounds: Normal breath sounds. No wheezing, rhonchi or rales.  Musculoskeletal:        General: Swelling and tenderness present.     Right lower leg: Edema (tr) present.     Left lower leg: No edema.  Skin:    General: Skin is warm and dry.     Findings: No rash.  Neurological:     Mental Status: He is alert.  Psychiatric:        Mood and Affect: Mood normal.        Behavior: Behavior normal.       RIGHT LOWER EXTREMITY VENOUS DOPPLER ULTRASOUND   TECHNIQUE: Gray-scale sonography with compression, as well as color and duplex ultrasound, were performed to evaluate the deep venous system(s) from the level of the common femoral vein through the popliteal and proximal calf veins.   COMPARISON:  None Available.   FINDINGS: VENOUS   Normal compressibility of the common femoral, superficial femoral, and popliteal veins, as well as the visualized calf veins. Visualized portions of profunda femoral vein and great saphenous vein unremarkable. No filling defects to suggest DVT on grayscale or color Doppler imaging. Doppler waveforms show  normal direction of venous flow, normal respiratory plasticity and response to augmentation.   Limited views of the contralateral common femoral vein are unremarkable.   OTHER   Extensive superficial subcutaneous edema.   Limitations: none   IMPRESSION: No evidence of deep venous thrombosis.   Superficial subcutaneous edema.     Electronically Signed   By: Jacqulynn Cadet M.D.   On: 04/10/2022 14:51  VAS [place] ABI WITH/WO TBI  LOWER EXTREMITY DOPPLER STUDY  Patient Name:  Parker Martinez  Date of Exam:   05/04/2022 Medical Rec #: [id number]         Accession #:    [id number] Date of Birth: 02/17/1941         Patient Gender: M Patient Age:   8 years Exam Location:  [facility] Procedure:      VAS [place] ABI WITH/WO TBI Referring Phys: Ria Bush  --------------------------------------------------------------------------------   Indications: Peripheral artery disease.   Performing Technologist: Ralene Cork RVT    Examination Guidelines: A complete evaluation includes at minimum, Doppler waveform signals and systolic blood pressure reading at the level of bilateral brachial, anterior tibial, and posterior tibial arteries, when vessel segments are accessible. Bilateral testing is considered an integral part of a complete examination. Photoelectric Plethysmograph (PPG) waveforms and toe systolic pressure readings are included as required and additional duplex testing as needed. Limited examinations for reoccurring indications may be performed as noted.    ABI Findings: +---------+------------------+-----+----------+--------+ Right    Rt Pressure (mmHg)IndexWaveform  Comment  +---------+------------------+-----+----------+--------+ Brachial 159                                       +---------+------------------+-----+----------+--------+ PTA      61                0.38 monophasic          +---------+------------------+-----+----------+--------+ DP       105               0.66 monophasic         +---------+------------------+-----+----------+--------+ Great Toe0                 0.00                    +---------+------------------+-----+----------+--------+  +---------+------------------+-----+----------+-------+ Left     Lt Pressure (mmHg)IndexWaveform  Comment +---------+------------------+-----+----------+-------+ Brachial 147                                      +---------+------------------+-----+----------+-------+ PTA      255               1.60 monophasic        +---------+------------------+-----+----------+-------+ DP       89                0.56 monophasic        +---------+------------------+-----+----------+-------+ Great Toe43                0.27                   +---------+------------------+-----+----------+-------+  +-------+-----------+-----------+------------+------------+ ABI/TBIToday's ABIToday's TBIPrevious ABIPrevious TBI +-------+-----------+-----------+------------+------------+ Right  0.66       0          0.64        0.25         +-------+-----------+-----------+------------+------------+ Left   [place]         0.27       0.93        0.44         +-------+-----------+-----------+------------+------------+     Previous ABI at [facility] on 10/11/20.   Summary: Right: Resting right ankle-brachial index indicates moderate right lower extremity arterial disease. The right toe-brachial index is abnormal. Pedal pressures may be falsely elevated.  Left: Resting left ankle-brachial  index indicates noncompressible left lower extremity arteries. The left toe-brachial index is abnormal.  *See table(s) above for measurements and observations.    Electronically signed by Deitra Mayo MD on 05/04/2022 at 4:46:28 PM.      Final    Assessment & Plan:   Problem List Items Addressed  This Visit     Idiopathic anaphylaxis    epipen refilled.       PVD (peripheral vascular disease) with claudication (HCC)    Upcoming [facility] appt next week to determine if R leg pain could be stemming from PAD/claudication/resting pain.  Recommend against compression stocking use.        Relevant Medications   EPINEPHrine (EPIPEN 2-PAK) 0.3 mg/0.3 mL IJ SOAJ injection   traMADol (ULTRAM) 50 MG tablet   gabapentin (NEURONTIN) 100 MG capsule   Pain and swelling of right lower leg - Primary    Improved but persistent swelling with pain to R lower leg, spares foot. Today describes neuropathic component including paresthesias/numbness to lower leg. Rx gabapentin with sedation/dizziness precautions.  He already regularly takes vitamin B12 524mcg daily.  Discussed possible association of alcohol use with neuropathy.  If reassuring upcoming PAD evaluation next month, he will let me know to return for further neuropathy evaluation/labwork.        Non-small cell lung cancer ([place])    Receiving SBRT through [facility] ([facility]).         Meds ordered this encounter  Medications   EPINEPHrine (EPIPEN 2-PAK) 0.3 mg/0.3 mL IJ SOAJ injection    Sig: Inject 0.3 mg into the muscle as needed for anaphylaxis.    Dispense:  2 each    Refill:  1   traMADol (ULTRAM) 50 MG tablet    Sig: Take 1 tablet (50 mg total) by mouth 2 (two) times daily as needed.    Dispense:  30 tablet    Refill:  1   gabapentin (NEURONTIN) 100 MG capsule    Sig: Take 1-2 capsules (100-200 mg total) by mouth at bedtime.    Dispense:  60 capsule    Refill:  1   No orders of the defined types were placed in this encounter.    Patient Instructions  No compression stockings to legs given your history of peripheral arterial disease.  Tramadol refilled.  Keep appointment with vascular surgeon for evaluation. If he doesn't think pain is coming from arterial blockages, let me know to check further neuropathy labwork.  In the  meantime, may try gabapentin 100mg  1-2 tablets at bedtime.   Follow up plan: Return if symptoms worsen or fail to improve.  Ria Bush, MD

## 2022-05-10 NOTE — Patient Instructions (Addendum)
No compression stockings to legs given your history of peripheral arterial disease.  Tramadol refilled.  Keep appointment with vascular surgeon for evaluation. If he doesn't think pain is coming from arterial blockages, let me know to check further neuropathy labwork.  In the meantime, may try gabapentin 100mg  1-2 tablets at bedtime.

## 2022-05-10 NOTE — Assessment & Plan Note (Signed)
epipen refilled.

## 2022-05-11 ENCOUNTER — Ambulatory Visit: Payer: PPO | Admitting: Vascular Surgery

## 2022-05-11 ENCOUNTER — Other Ambulatory Visit: Payer: Self-pay

## 2022-05-11 ENCOUNTER — Ambulatory Visit
Admission: RE | Admit: 2022-05-11 | Discharge: 2022-05-11 | Disposition: A | Discharge status: Home / Self Care | Payer: PPO | Source: Ambulatory Visit | Attending: Radiation Oncology | Admitting: Radiation Oncology

## 2022-05-11 DIAGNOSIS — M79661 Pain in right lower leg: Secondary | ICD-10-CM | POA: Diagnosis not present

## 2022-05-11 LAB — RAD ONC ARIA SESSION SUMMARY
Course Elapsed Days: 9
Plan Fractions Treated to Date: 4
Plan Prescribed Dose Per Fraction: 12 Gy
Plan Total Fractions Prescribed: 5
Plan Total Prescribed Dose: 60 Gy
Reference Point Dosage Given to Date: 48 Gy
Reference Point Session Dosage Given: 12 Gy
Session Number: 4

## 2022-05-16 ENCOUNTER — Ambulatory Visit
Admission: RE | Admit: 2022-05-16 | Discharge: 2022-05-16 | Disposition: A | Discharge status: Home / Self Care | Payer: PPO | Source: Ambulatory Visit | Attending: Radiation Oncology | Admitting: Radiation Oncology

## 2022-05-16 ENCOUNTER — Other Ambulatory Visit: Payer: Self-pay

## 2022-05-16 DIAGNOSIS — Z87891 Personal history of nicotine dependence: Secondary | ICD-10-CM | POA: Diagnosis not present

## 2022-05-16 DIAGNOSIS — R918 Other nonspecific abnormal finding of lung field: Secondary | ICD-10-CM | POA: Diagnosis not present

## 2022-05-16 DIAGNOSIS — M79661 Pain in right lower leg: Secondary | ICD-10-CM | POA: Diagnosis not present

## 2022-05-16 LAB — RAD ONC ARIA SESSION SUMMARY
Course Elapsed Days: 14
Plan Fractions Treated to Date: 5
Plan Prescribed Dose Per Fraction: 12 Gy
Plan Total Fractions Prescribed: 5
Plan Total Prescribed Dose: 60 Gy
Reference Point Dosage Given to Date: 60 Gy
Reference Point Session Dosage Given: 12 Gy
Session Number: 5

## 2022-05-18 ENCOUNTER — Encounter: Payer: Self-pay | Admitting: Vascular Surgery

## 2022-05-18 ENCOUNTER — Ambulatory Visit: Payer: PPO

## 2022-05-18 ENCOUNTER — Ambulatory Visit: Payer: PPO | Admitting: Vascular Surgery

## 2022-05-18 VITALS — BP 121/75 | HR 95 | Temp 97.9°F | Resp 20 | Ht 68.0 in | Wt 150.0 lb

## 2022-05-18 DIAGNOSIS — I6521 Occlusion and stenosis of right carotid artery: Secondary | ICD-10-CM

## 2022-05-18 DIAGNOSIS — I739 Peripheral vascular disease, unspecified: Secondary | ICD-10-CM | POA: Diagnosis not present

## 2022-05-18 DIAGNOSIS — R0600 Dyspnea, unspecified: Secondary | ICD-10-CM | POA: Diagnosis not present

## 2022-05-18 NOTE — Progress Notes (Signed)
[facility]   PERIPHERAL ARTERIAL DISEASE: This patient has evidence of multilevel arterial occlusive disease on exam.  He has evidence of both aortoiliac occlusive disease and infrainguinal arterial occlusive disease.  I think he is having some rest pain but he feels that his symptoms are tolerable.  He has significant COPD and would be very high risk for any invasive procedure.  He also just finished radiation therapy for a lung cancer.  I have ordered follow-up ABIs in 1 year and I will see him back at that time.  If his symptoms progress before that I think we would have to consider arteriography.  I think he likely has aortoiliac occlusive disease which may be amenable to angioplasty and stenting.  He certainly would not be a good candidate for surgery.  I encouraged him to stay as active as possible  RIGHT CAROTID STENOSIS: This patient has a 40 to 59% right carotid stenosis which is asymptomatic.  He has no significant stenosis on the left.  He gets a yearly duplex scan at [facility] and he tells me that he is scheduled for this later in the year.  I believe he will have this in August.  He understands we would not consider right carotid endarterectomy unless he became symptomatic or the stenosis progressed to greater than 80%.  Fortunately he is not a smoker.  He is on aspirin and is on a statin.  REASON FOR CONSULT:    Peripheral arterial disease.  The consult is requested by Dr. Alyce Pagan.   HPI:   ANTRONE WALLA is a 81 y.o. male who was referred with peripheral arterial disease.  It looks like I saw him back in July 2019 with peripheral arterial disease and carotid disease.  At that time he had stable claudication of the right lower extremity with an ABI of 48% on the right and 59% on the left.  He had no rest pain or nonhealing ulcers.  He had just quit smoking at that time.  Since I saw him last he continues to have claudication bilaterally which is  more significant on the right side.  He can only walk very short distance before experiencing symptoms.  It also sounds like he is developed some rest pain in both feet.  He denies any history of nonhealing wounds.  He did have an episode with the right leg became swollen and erythematous.  Duplex scan showed no evidence of DVT.  He was treated with antibiotics and it sounds like he may have had an insect bite that caused this episode.  At his last visit in 2019 he was also noted to have a 40 to 59% right carotid stenosis with no significant stenosis on the left.  He denies any history of stroke, TIAs, expressive or receptive aphasia, or amaurosis fugax.  Of note he has been treated for lung cancer and just finished a round of radiation therapy.  He has significant COPD and is on home O2 continuously.    Past Medical History:  Diagnosis Date   Abnormal drug screen 10/2013, 03/2015   low Cr, +EtOH, neg hydrocodone (10/2013, again 03/2015)   Allergic rhinitis    Arthritis    lumbar and cervical spine   Carotid stenosis 09/11/2014   40-59% bilat ICA stenosis, rpt 1 yr (09/2014)    COPD (chronic obstructive pulmonary disease) (HCC)    severe, participated in [place] study   DDD (degenerative disc disease)    lumbar (  HNP R L3/4) s/p ESI, cervical   Diverticulitis 2015   by CT   Diverticulosis 2008   severe by colonoscopy and CT   Duodenitis    Esophageal stricture 2012   s/p dilation   GERD (gastroesophageal reflux disease)    Hemangioma    congenital left arm   History of kidney stones remote   HTN (hypertension)    borderline readings at home   Idiopathic anaphylaxis    h/o recurrent anaphylaxis, unknown trigger, s/p eval at [facility] [phone]), last episode 03/2011   PAD (peripheral artery disease) ([place])    Personal history of colonic adenomas 09/02/2013   Prostate cancer ([place]) 2011   prostate seed implant (Dr. [name])   Smoker    Status post dilatation of esophageal  stricture    Subclavian steal syndrome 10/12/2016   Left subclavian steal, with 18 mmHg brachial artery pressure gradient (10/2016). If symptomatic, rec PV consult    Family History  Problem Relation Age of Onset   Lung cancer Father    Hyperlipidemia Father    Arthritis Mother    Breast cancer Daughter    Coronary artery disease Neg Hx    Stroke Neg Hx    Diabetes Neg Hx     SOCIAL HISTORY: Social History   Tobacco Use   Smoking status: Former    Packs/day: 1.00    Years: 60.00    Total pack years: 60.00    Types: Cigarettes    Quit date: 12/13/2021    Years since quitting: 0.4   Smokeless tobacco: Never   Tobacco comments:            5-6 cigs pd- 12/13/21     Quit in January   Substance Use Topics   Alcohol use: Yes    Alcohol/week: 14.0 standard drinks of alcohol    Types: 14 Cans of beer per week    Comment: socially    Allergies  Allergen Reactions   Flonase [Fluticasone Propionate] Other (See Comments)    Per pt, causes rebound effect     Current Outpatient Medications  Medication Sig Dispense Refill   acetaminophen (TYLENOL) 325 MG tablet Take 1 tablet (325 mg total) by mouth every 6 (six) hours as needed for mild pain, fever or headache (or Fever >/= 101).     albuterol (PROVENTIL) (2.5 MG/3ML) 0.083% nebulizer solution INHALE 1 VIAL VIA NEBULIZER EVERY 4 HOURS AS NEEDED FOR WHEEZING OR SHORTNESS OF BREATH 375 mL 2   albuterol (VENTOLIN HFA) 108 (90 Base) MCG/ACT inhaler USE 2 PUFFS BY MOUTH EVERY 6 HOURS AS NEEDED FOR WHEEZING 18 each 2   alfuzosin (UROXATRAL) 10 MG 24 hr tablet Take 10 mg by mouth daily with breakfast.     aspirin 81 MG tablet Take 81 mg by mouth every morning.     atorvastatin (LIPITOR) 40 MG tablet TAKE 1 TABLET BY MOUTH EVERY DAY 90 tablet 2   Budeson-Glycopyrrol-Formoterol (BREZTRI AEROSPHERE) 160-9-4.8 MCG/ACT AERO Inhale 2 puffs into the lungs 2 (two) times daily. 10.7 g 11   Budeson-Glycopyrrol-Formoterol (BREZTRI AEROSPHERE)  160-9-4.8 MCG/ACT AERO Inhale 2 puffs into the lungs in the morning and at bedtime. 5.9 g 0   EPINEPHrine (EPIPEN 2-PAK) 0.3 mg/0.3 mL IJ SOAJ injection Inject 0.3 mg into the muscle as needed for anaphylaxis. 2 each 1   fexofenadine (ALLEGRA) 180 MG tablet Take 180 mg by mouth daily.     gabapentin (NEURONTIN) 100 MG capsule Take 1-2 capsules (100-200 mg total)  by mouth at bedtime. 60 capsule 1   ipratropium (ATROVENT) 0.06 % nasal spray SPRAY 2 SPRAYS INTO EACH NOSTRIL 4 TIMES A DAY AS NEEDED FOR RHINITIS 15 mL 1   montelukast (SINGULAIR) 10 MG tablet TAKE 1 TABLET BY MOUTH EVERYDAY AT BEDTIME 90 tablet 3   Multiple Vitamin (MULTIVITAMIN) tablet Take 1 tablet by mouth daily.     pantoprazole (PROTONIX) 40 MG tablet Take 1 tablet (40 mg total) by mouth 2 (two) times daily before a meal. 180 tablet 3   predniSONE (DELTASONE) 10 MG tablet TAKE 1 TABLET (10 MG TOTAL) BY MOUTH DAILY WITH BREAKFAST. 30 tablet 6   Roflumilast 250 MCG TABS TAKE 1 TABLET BY MOUTH EVERY DAY 28 tablet 2   traMADol (ULTRAM) 50 MG tablet Take 1 tablet (50 mg total) by mouth 2 (two) times daily as needed. 30 tablet 1   vitamin B-12 (CYANOCOBALAMIN) 500 MCG tablet Take 500 mcg by mouth every morning.      No current facility-administered medications for this visit.    REVIEW OF SYSTEMS:  [X]  denotes positive finding, [ ]  denotes negative finding Cardiac  Comments:  Chest pain or chest pressure:    Shortness of breath upon exertion: x   Short of breath when lying flat: x   Irregular heart rhythm:        Vascular    Pain in calf, thigh, or hip brought on by ambulation:    Pain in feet at night that wakes you up from your sleep:     Blood clot in your veins:    Leg swelling:         Pulmonary    Oxygen at home:    Productive cough:     Wheezing:         Neurologic    Sudden weakness in arms or legs:     Sudden numbness in arms or legs:     Sudden onset of difficulty speaking or slurred speech:    Temporary  loss of vision in one eye:     Problems with dizziness:         Gastrointestinal    Blood in stool:     Vomited blood:         Genitourinary    Burning when urinating:     Blood in urine:        Psychiatric    Major depression:         Hematologic    Bleeding problems:    Problems with blood clotting too easily:        Skin    Rashes or ulcers:        Constitutional    Fever or chills:    -  PHYSICAL EXAM:   Vitals:   05/18/22 1249  BP: 121/75  Pulse: 95  Resp: 20  Temp: 97.9 F (36.6 C)  SpO2: 93%  Weight: 150 lb (68 kg)  Height: 5\' 8"  (1.727 m)   Body mass index is 22.81 kg/m.  GENERAL: The patient is a well-nourished male, in no acute distress. The vital signs are documented above. CARDIAC: There is a regular rate and rhythm.  VASCULAR: He has a right carotid bruit. He has diminished but palpable femoral pulses.  Left side is more difficult to palpate. PULMONARY: There is good air exchange bilaterally without wheezing or rales. ABDOMEN: Soft and non-tender with normal pitched bowel sounds.  MUSCULOSKELETAL: There are no major deformities. NEUROLOGIC: No focal weakness or paresthesias are  detected. SKIN: There are no ulcers or rashes noted. PSYCHIATRIC: The patient has a normal affect.  DATA:    ARTERIAL DOPPLER STUDY: I have reviewed his arterial Doppler study that was done on 05/04/2022.  On the right side he has a monophasic dorsalis pedis and posterior tibial signal.  ABI is 66%.  Toe pressures 0.  On the left side, he has a monophasic dorsalis pedis and posterior tibial signal.  ABI is 100% although this is falsely elevated secondary to calcific disease.  Toe pressures 43 mmHg.  CAROTID DUPLEX: I did review his most recent carotid duplex scan that was done on 07/27/2021.    On the right side, he has a 40 to 59% right carotid stenosis.  The right vertebral artery is patent with antegrade flow.  On the left side, there is no significant carotid  stenosis.  The left vertebral artery has retrograde flow.   Deitra Mayo [facility]

## 2022-05-19 DIAGNOSIS — J449 Chronic obstructive pulmonary disease, unspecified: Secondary | ICD-10-CM | POA: Diagnosis not present

## 2022-05-23 ENCOUNTER — Ambulatory Visit: Payer: PPO | Admitting: Pulmonary Disease

## 2022-05-23 ENCOUNTER — Encounter: Payer: Self-pay | Admitting: Pulmonary Disease

## 2022-05-23 VITALS — BP 116/70 | HR 98 | Temp 97.9°F | Ht 68.0 in | Wt 154.0 lb

## 2022-05-23 DIAGNOSIS — J479 Bronchiectasis, uncomplicated: Secondary | ICD-10-CM | POA: Diagnosis not present

## 2022-05-23 DIAGNOSIS — J449 Chronic obstructive pulmonary disease, unspecified: Secondary | ICD-10-CM

## 2022-05-23 DIAGNOSIS — R0609 Other forms of dyspnea: Secondary | ICD-10-CM | POA: Diagnosis not present

## 2022-05-23 DIAGNOSIS — J9611 Chronic respiratory failure with hypoxia: Secondary | ICD-10-CM

## 2022-05-23 DIAGNOSIS — C3492 Malignant neoplasm of unspecified part of left bronchus or lung: Secondary | ICD-10-CM | POA: Diagnosis not present

## 2022-05-23 MED ORDER — BREZTRI AEROSPHERE 160-9-4.8 MCG/ACT IN AERO: 2.0000 | INHALATION_SPRAY | Freq: Two times a day (BID) | RESPIRATORY_TRACT | 0 refills | Status: DC

## 2022-05-23 NOTE — Patient Instructions (Signed)
Continue the [employer].  Follow-up in 3 months time.

## 2022-05-23 NOTE — Progress Notes (Signed)
Subjective:    Patient ID: Parker Martinez, male    DOB: 27-Feb-1941, 81 y.o.   MRN: [id number] Patient Care Team: Ria Bush, MD as PCP - General (Family Medicine) [name], MD as Consulting Physician (Ophthalmology) Angelia Mould, MD as Consulting Physician (Vascular Surgery) Gatha Mayer, MD as Consulting Physician (Gastroenterology) Myrlene Broker, MD as Attending Physician (Urology) Debbora Dus, [facility] as Pharmacist (Pharmacist) Tyler Pita, MD as Consulting Physician (Pulmonary Disease)   Requesting MD/Service: Ria Bush, MD Date of initial consultation: 05/06/18 by Dr. Merton Border Reason for consultation: Severe COPD, smoker   PT PROFILE: 81 y.o. male former smoker (previously > 1 PPD, with diagnosis of COPD rendered several years ago and with progressive DOE over past 5-7 years prior to initial consultation.   DATA: 10/19/10 Spirometry: Severe obstruction (FEV1 41% predicted) 05/28/18 PFTs: FVC: 2.13 > 2.96 L (58 > 80 %pred), FEV1: 0.85 > 1.04 L (30 > 36 %pred), FEV1/FVC: 40 >35%, TLC: 7.90 L (128 %pred), DLCO 50 %pred.  Flow volume curve consistent with severe obstruction 02/20/19 PFTs: FEV1 was 1.96 L or 48% predicted, FVC was 2.72 L or 95% predicted, FEV1/FVC was 38%.  TLC  131%, RV 165%, indicating hyperinflation and air trapping.  Diffusion capacity was 48%.  Findings consistent with severe obstruction and emphysema. 05/05/20 Overnight oximetry: Shows nocturnal desaturations and possible obstructive sleep apnea.  Patient will need sleep study 06/01/20 2D echo: LVEF 60 to 65%, no evidence of cor pulmonale, mild aortic sclerosis, diastolic dysfunction type I. 06/17/2020 chest x-ray two-view: Chronic COPD changes, upper lobe parenchymal scarring, right upper lobe nodules, stable. 10/21/2020 overnight oximetry: Significant desaturations during the night oxygen desaturation index 24 oxygen desaturation events 188.  Low O2 sats  85%, patient will require oxygen at 2 L/min nocturnally. 07/27/2021 echocardiogram: LVEF 55%, no wall motion abnormalities, grade II DD, RV function normal.  No overt valvulopathy.  Since prior 2D echo diastolic dysfunction has worsened. 09/30/2021 CT chest: Emphysematous changes, multiple nodules, mucous retention, focal bronchiectatic changes on left. 10/20/2021 PET/CT: The left-sided nodules have very low-grade activity likely related to apical scarring suggest short-term follow-up. 02/21/2022 chest CT without contrast: Severe pulmonary emphysema, enlargement of LEFT upper lobe nodule along bandlike changes, stable left upper lobe nodule and cystic area within nodularity of the left upper lobe nodule, new LEFT lower lobe nodule consistent with infection but needs attention on follow-up, areas of groundglass on the RIGHT upper lobe. 03/31/2022 PET/CT: The left upper lobe pulmonary lesion is now hypermetabolic and consistent with primary lung neoplasm   Chief Complaint  Patient presents with   Follow-up    SOB with exertion, dry cough at times prod with clear sputum and wheezing.    HPI Parker Martinez is an 81 year old former smoker (quit January 2023, 60 PY) with very severe COPD and chronic debilitating dyspnea.  He was last seen on 21 March 2022.  At a prior visit we had recommended chest vest to help with clearance of secretions however, the patient did not want to proceed due to cost.  He has been placed on Daliresp and this has helped with his secretion clearance.  Also uses Acapella flutter valve.  He has had issues with left upper lobe nodules that on prior CT showed very low grade activity likely related to inflammatory change/inflammation.  However he had a chest CT on 21 February 2022 to follow-up on these lesions and unfortunately he has had growth on the left upper lobe nodule  which is highly suspicious for malignancy.  I discussed with the patient that he is not a candidate for invasive procedures  however, he underwent PET/CT on 28 April which showed activity on the left upper lobe pulmonary lesion that was consistent with malignancy.  He was referred to radiation oncology for empiric SBRT given that he is not a candidate for invasive procedures nor for operative procedures.  Overall he actually looks fairly good today. He presents in transport chair due to dyspnea.  Does not endorse any new symptomatology.  Secretions are better since starting on Daliresp and using Acapella regularly.  He still prefers Breztri to any other medication for his COPD.  He has not had any hemoptysis.  Weight has been stable.  He has tolerated SBRT without any difficulty.  He is compliant with oxygen at 2 L/min pulse.  Notes that this therapy helps him.  Review of Systems A 10 point review of systems was performed and it is as noted above otherwise negative.  Patient Active Problem List   Diagnosis Date Noted   Non-small cell lung cancer ([place]) 05/10/2022   Pain and swelling of right lower leg 04/12/2022   Palliative care status 11/08/2021   Pulmonary hypertension ([place]) 05/19/2021   Orthostatic dizziness 05/19/2021   Chronic respiratory failure with hypoxia ([place]) 02/25/2021   Protein-calorie malnutrition ([place]) 11/04/2020   Tachycardia 10/20/2020   Right foot drop 09/21/2020   Pedal edema 09/07/2020   Aorto-iliac atherosclerosis ([place]) 01/25/2019   Left lumbar radiculopathy 01/24/2019   Bilateral hearing loss 10/21/2018   BPV (benign positional vertigo), right 09/09/2018   Heart murmur 09/09/2018   Encounter for chronic pain management 05/15/2017   Chronic pain in left shoulder 01/30/2017   Subclavian steal syndrome 10/12/2016   Atherosclerosis of native arteries of extremity with intermittent claudication ([place]) 10/21/2014   Medicare annual wellness visit, subsequent 09/11/2014   Carotid stenosis 09/11/2014   Advanced care planning/counseling discussion 09/11/2014   Vitamin D deficiency 09/01/2014    DDD (degenerative disc disease), lumbar    Personal history of colonic adenomas 09/02/2013   COPD exacerbation ([place]) 03/04/2013   Healthcare maintenance 04/25/2012   Polycythemia secondary to smoking 04/25/2012   Dyslipidemia 04/25/2012   HTN (hypertension)    PVD (peripheral vascular disease) with claudication (HCC)    COPD, severe (HCC)    Prostate cancer (HCC)    GERD (gastroesophageal reflux disease)    Idiopathic anaphylaxis    Smoker 09/13/2011   Esophageal rings - lower esophagus 06/04/2009   Social History   Tobacco Use   Smoking status: Former    Packs/day: 1.00    Years: 60.00    Total pack years: 60.00    Types: Cigarettes    Quit date: 12/13/2021    Years since quitting: 0.6   Smokeless tobacco: Never   Tobacco comments:            5-6 cigs pd- 12/13/21     Quit in January   Substance Use Topics   Alcohol use: Yes    Alcohol/week: 14.0 standard drinks of alcohol    Types: 14 Cans of beer per week    Comment: socially   Allergies  Allergen Reactions   Flonase [Fluticasone Propionate] Other (See Comments)    Per pt, causes rebound effect    Current Meds  Medication Sig   acetaminophen (TYLENOL) 325 MG tablet Take 1 tablet (325 mg total) by mouth every 6 (six) hours as needed for mild pain, fever or headache (or  Fever >/= 101).   albuterol (PROVENTIL) (2.5 MG/3ML) 0.083% nebulizer solution INHALE 1 VIAL VIA NEBULIZER EVERY 4 HOURS AS NEEDED FOR WHEEZING OR SHORTNESS OF BREATH   albuterol (VENTOLIN HFA) 108 (90 Base) MCG/ACT inhaler USE 2 PUFFS BY MOUTH EVERY 6 HOURS AS NEEDED FOR WHEEZING   alfuzosin (UROXATRAL) 10 MG 24 hr tablet Take 10 mg by mouth daily with breakfast.   aspirin 81 MG tablet Take 81 mg by mouth every morning.   atorvastatin (LIPITOR) 40 MG tablet TAKE 1 TABLET BY MOUTH EVERY DAY   Budeson-Glycopyrrol-Formoterol (BREZTRI AEROSPHERE) 160-9-4.8 MCG/ACT AERO Inhale 2 puffs into the lungs 2 (two) times daily.   EPINEPHrine (EPIPEN 2-PAK) 0.3  mg/0.3 mL IJ SOAJ injection Inject 0.3 mg into the muscle as needed for anaphylaxis.   fexofenadine (ALLEGRA) 180 MG tablet Take 180 mg by mouth daily.   gabapentin (NEURONTIN) 100 MG capsule Take 1-2 capsules (100-200 mg total) by mouth at bedtime.   ipratropium (ATROVENT) 0.06 % nasal spray SPRAY 2 SPRAYS INTO EACH NOSTRIL 4 TIMES A DAY AS NEEDED FOR RHINITIS   montelukast (SINGULAIR) 10 MG tablet TAKE 1 TABLET BY MOUTH EVERYDAY AT BEDTIME   Multiple Vitamin (MULTIVITAMIN) tablet Take 1 tablet by mouth daily.   pantoprazole (PROTONIX) 40 MG tablet Take 1 tablet (40 mg total) by mouth 2 (two) times daily before a meal.   predniSONE (DELTASONE) 10 MG tablet TAKE 1 TABLET (10 MG TOTAL) BY MOUTH DAILY WITH BREAKFAST.   traMADol (ULTRAM) 50 MG tablet Take 1 tablet (50 mg total) by mouth 2 (two) times daily as needed.   vitamin B-12 (CYANOCOBALAMIN) 500 MCG tablet Take 500 mcg by mouth every morning.    [DISCONTINUED] Budeson-Glycopyrrol-Formoterol (BREZTRI AEROSPHERE) 160-9-4.8 MCG/ACT AERO Inhale 2 puffs into the lungs in the morning and at bedtime.   [DISCONTINUED] Roflumilast 250 MCG TABS TAKE 1 TABLET BY MOUTH EVERY DAY   Immunization History  Administered Date(s) Administered   Fluad Quad(high Dose 65+) 09/09/2019, 09/07/2020   Influenza, High Dose Seasonal PF 09/07/2021   Influenza,inj,Quad PF,6+ Mos 09/11/2014, 09/24/2015, 09/20/2016, 09/14/2017, 08/29/2018   PFIZER(Purple Top)SARS-COV-2 Vaccination 12/20/2019, 01/10/2020, 09/05/2020   Pfizer Covid-19 Vaccine Bivalent Booster 30yrs & up 09/07/2021   Pneumococcal Conjugate-13 09/11/2014   Pneumococcal Polysaccharide-23 12/04/2009      Objective:   Physical Exam BP 116/70 (BP Location: Left Arm, Cuff Size: Normal)   Pulse 98   Temp 97.9 F (36.6 C) (Temporal)   Ht 5\' 8"  (1.727 m)   Wt 154 lb (69.9 kg)   SpO2 96%   BMI 23.42 kg/m on oxygen at 2 L/min pulse. GENERAL: Well-groomed elderly male, no acute distress.  Use of  accessories, chronic.  Presents in transport chair (due to dyspnea).  No conversational dyspnea. HEAD: Normocephalic, atraumatic. EYES: Pupils equal, round, reactive to light.  No scleral icterus. MOUTH: Nose/mouth/throat not examined due to masking requirements for COVID 19. NECK: Supple. No thyromegaly. Trachea midline. No JVD.  No adenopathy. PULMONARY: Distant breath sounds, poor air movement, coarse breath sounds with no wheezes noted.   CARDIOVASCULAR: S1 and S2.  Tachycardic rate with regular rhythm.    Grade 1/6 to 2/6 systolic ejection murmur left sternal border. GASTROINTESTINAL: Benign. MUSCULOSKELETAL: No joint deformity, no clubbing, no edema. NEUROLOGIC: No overt focal deficit noted.  Speech is fluent. SKIN: No rashes, port wine nevus on middle finger left hand. PSYCH: Mood and behavior are normal     Assessment & Plan:     ICD-10-CM   1.  Stage 4 very severe COPD by GOLD classification ([place])  J44.9    Continue Breztri 2 puffs twice a day Continue as needed albuterol Continue Daliresp    2. Bronchiectasis without complication ([place])  Y69.4    Continue Acapella flutter valve Patient has declined vest physiotherapy    3. Chronic respiratory failure with hypoxia (HCC)  J96.11    Continue oxygen at 2 L/min pulsed Patient is compliant with therapy    4. Severe exertional dyspnea, likely fully explained by COPD  R06.09    Fairly well compensated No issues with worsening lately    5. Non-small cell cancer of left lung (HCC)  C34.92    Underwent empiric SBRT to left upper lobe lesion Positive on PET/CT Patient not operative candidate     Meds ordered this encounter  Medications   Budeson-Glycopyrrol-Formoterol (BREZTRI AEROSPHERE) 160-9-4.8 MCG/ACT AERO    Sig: Inhale 2 puffs into the lungs in the morning and at bedtime.    Dispense:  5.9 g    Refill:  0    Order Specific Question:   Lot Number?    Answer:   [id number]    Order Specific Question:   Expiration Date?     Answer:   11/03/2024    Order Specific Question:   Manufacturer?    Answer:   [employer] [[id number]]    Order Specific Question:   Quantity    Answer:   2   We will see the patient in follow-up in 3 months time he is to contact us prior to that time should any new difficulties arise.  Renold Don, MD Advanced Bronchoscopy PCCM [facility]    *This note was dictated using voice recognition software/Dragon.  Despite best efforts to proofread, errors can occur which can change the meaning. Any transcriptional errors that result from this process are unintentional and may not be fully corrected at the time of dictation.

## 2022-06-18 DIAGNOSIS — J449 Chronic obstructive pulmonary disease, unspecified: Secondary | ICD-10-CM | POA: Diagnosis not present

## 2022-06-19 DIAGNOSIS — Z8719 Personal history of other diseases of the digestive system: Secondary | ICD-10-CM | POA: Diagnosis not present

## 2022-06-19 DIAGNOSIS — J9610 Chronic respiratory failure, unspecified whether with hypoxia or hypercapnia: Secondary | ICD-10-CM | POA: Diagnosis not present

## 2022-06-19 DIAGNOSIS — Z515 Encounter for palliative care: Secondary | ICD-10-CM | POA: Diagnosis not present

## 2022-06-19 DIAGNOSIS — Z510 Encounter for antineoplastic radiation therapy: Secondary | ICD-10-CM | POA: Diagnosis not present

## 2022-06-19 DIAGNOSIS — I10 Essential (primary) hypertension: Secondary | ICD-10-CM | POA: Diagnosis not present

## 2022-06-19 DIAGNOSIS — R1319 Other dysphagia: Secondary | ICD-10-CM | POA: Diagnosis not present

## 2022-06-19 DIAGNOSIS — C3412 Malignant neoplasm of upper lobe, left bronchus or lung: Secondary | ICD-10-CM | POA: Diagnosis not present

## 2022-06-19 DIAGNOSIS — Z7951 Long term (current) use of inhaled steroids: Secondary | ICD-10-CM | POA: Diagnosis not present

## 2022-06-19 DIAGNOSIS — Z66 Do not resuscitate: Secondary | ICD-10-CM | POA: Diagnosis not present

## 2022-06-19 DIAGNOSIS — I739 Peripheral vascular disease, unspecified: Secondary | ICD-10-CM | POA: Diagnosis not present

## 2022-06-19 DIAGNOSIS — J449 Chronic obstructive pulmonary disease, unspecified: Secondary | ICD-10-CM | POA: Diagnosis not present

## 2022-06-19 DIAGNOSIS — Z7982 Long term (current) use of aspirin: Secondary | ICD-10-CM | POA: Diagnosis not present

## 2022-06-23 ENCOUNTER — Other Ambulatory Visit: Payer: Self-pay | Admitting: Pulmonary Disease

## 2022-06-28 ENCOUNTER — Telehealth: Payer: Self-pay | Admitting: Family Medicine

## 2022-06-28 DIAGNOSIS — I6523 Occlusion and stenosis of bilateral carotid arteries: Secondary | ICD-10-CM

## 2022-06-28 NOTE — Telephone Encounter (Signed)
Spoke with pt relaying Dr. G's message. Pt verbalizes understanding.  

## 2022-06-28 NOTE — Telephone Encounter (Signed)
Plz notify last carotid US was done 07/27/2021 - will be due for rpt [place] next month.  I've gone ahead and ordered, to be scheduled after 07/27/2022.

## 2022-06-28 NOTE — Telephone Encounter (Signed)
Patient was calling because he said he usually get a call about his annual corroded dopler study but hasnt heard anything about it. Patient callback is [phone]

## 2022-06-28 NOTE — Telephone Encounter (Signed)
Last carodid [place]- 07/27/21.  Does pt need order?

## 2022-06-28 NOTE — Addendum Note (Signed)
Addended by: Ria Bush on: 06/28/2022 02:50 PM   Modules accepted: Orders

## 2022-06-29 DIAGNOSIS — C61 Malignant neoplasm of prostate: Secondary | ICD-10-CM | POA: Diagnosis not present

## 2022-06-29 DIAGNOSIS — R350 Frequency of micturition: Secondary | ICD-10-CM | POA: Diagnosis not present

## 2022-07-06 ENCOUNTER — Ambulatory Visit
Admission: RE | Admit: 2022-07-06 | Discharge: 2022-07-06 | Disposition: A | Discharge status: Home / Self Care | Payer: PPO | Source: Ambulatory Visit | Attending: Radiation Oncology | Admitting: Radiation Oncology

## 2022-07-06 VITALS — BP 118/88 | HR 118 | Temp 97.0°F | Resp 20 | Ht 68.0 in | Wt 150.0 lb

## 2022-07-06 DIAGNOSIS — Z923 Personal history of irradiation: Secondary | ICD-10-CM | POA: Insufficient documentation

## 2022-07-06 DIAGNOSIS — R911 Solitary pulmonary nodule: Secondary | ICD-10-CM

## 2022-07-06 DIAGNOSIS — R918 Other nonspecific abnormal finding of lung field: Secondary | ICD-10-CM | POA: Diagnosis not present

## 2022-07-06 NOTE — Progress Notes (Signed)
Radiation Oncology Follow up Note  Name: Parker Martinez   Date:   07/06/2022 MRN:  [id number] DOB: 1941/05/22    This 81 y.o. male presents to the clinic today for 1 month follow-up status post SBRT to a presumed stage I non-small cell lung cancer of left upper lobe and patient with end-stage pulmonary disease.  REFERRING PROVIDER: Ria Bush, MD  HPI: Patient is an 81 year old male now at 1 month having completed SBRT for presumed stage I non-small cell lung cancer left upper lobe.  Seen today in routine follow-up he is doing well he states his pulmonary status has not changed.  He specifically [place] cough hemoptysis chest tightness or dysphagia..  COMPLICATIONS OF TREATMENT: none  FOLLOW UP COMPLIANCE: keeps appointments   PHYSICAL EXAM:  BP 118/88   Pulse (!) 118   Temp (!) 97 F (36.1 C)   Resp 20   Ht 5\' 8"  (1.727 m)   Wt 150 lb (68 kg)   BMI 22.81 kg/m  Patient is on nasal oxygen wheelchair-bound.  Well-developed well-nourished patient in NAD. HEENT reveals PERLA, EOMI, discs not visualized.  Oral cavity is clear. No oral mucosal lesions are identified. Neck is clear without evidence of cervical or supraclavicular adenopathy. Lungs are clear to A&P. Cardiac examination is essentially unremarkable with regular rate and rhythm without murmur rub or thrill. Abdomen is benign with no organomegaly or masses noted. Motor sensory and DTR levels are equal and symmetric in the upper and lower extremities. Cranial nerves II through XII are grossly intact. Proprioception is intact. No peripheral adenopathy or edema is identified. No motor or sensory levels are noted. Crude visual fields are within normal range.  RADIOLOGY RESULTS: CT scans ordered for 3 months.  PLAN: Present time patient is doing well very low side effect profile from his SBRT.  I have asked to see him back in 3 months with a follow-up CT scan at that time.  Patient knows to call with any concerns.  I would like to  take this opportunity to thank you for allowing me to participate in the care of your patient.Noreene Filbert, MD

## 2022-07-18 ENCOUNTER — Encounter: Payer: Self-pay | Admitting: Nurse Practitioner

## 2022-07-19 DIAGNOSIS — J449 Chronic obstructive pulmonary disease, unspecified: Secondary | ICD-10-CM | POA: Diagnosis not present

## 2022-07-31 ENCOUNTER — Ambulatory Visit: Payer: PPO | Attending: Family Medicine

## 2022-07-31 DIAGNOSIS — I6523 Occlusion and stenosis of bilateral carotid arteries: Secondary | ICD-10-CM

## 2022-08-10 DIAGNOSIS — Z7951 Long term (current) use of inhaled steroids: Secondary | ICD-10-CM | POA: Diagnosis not present

## 2022-08-10 DIAGNOSIS — Z7982 Long term (current) use of aspirin: Secondary | ICD-10-CM | POA: Diagnosis not present

## 2022-08-10 DIAGNOSIS — Z9981 Dependence on supplemental oxygen: Secondary | ICD-10-CM | POA: Diagnosis not present

## 2022-08-10 DIAGNOSIS — J449 Chronic obstructive pulmonary disease, unspecified: Secondary | ICD-10-CM | POA: Diagnosis not present

## 2022-08-10 DIAGNOSIS — J9610 Chronic respiratory failure, unspecified whether with hypoxia or hypercapnia: Secondary | ICD-10-CM | POA: Diagnosis not present

## 2022-08-10 DIAGNOSIS — Z85118 Personal history of other malignant neoplasm of bronchus and lung: Secondary | ICD-10-CM | POA: Diagnosis not present

## 2022-08-10 DIAGNOSIS — Z66 Do not resuscitate: Secondary | ICD-10-CM | POA: Diagnosis not present

## 2022-08-10 DIAGNOSIS — Z515 Encounter for palliative care: Secondary | ICD-10-CM | POA: Diagnosis not present

## 2022-08-10 DIAGNOSIS — I10 Essential (primary) hypertension: Secondary | ICD-10-CM | POA: Diagnosis not present

## 2022-08-10 DIAGNOSIS — Z6822 Body mass index (BMI) 22.0-22.9, adult: Secondary | ICD-10-CM | POA: Diagnosis not present

## 2022-08-10 DIAGNOSIS — I739 Peripheral vascular disease, unspecified: Secondary | ICD-10-CM | POA: Diagnosis not present

## 2022-08-10 DIAGNOSIS — H353230 Exudative age-related macular degeneration, bilateral, stage unspecified: Secondary | ICD-10-CM | POA: Diagnosis not present

## 2022-08-11 ENCOUNTER — Telehealth: Payer: Self-pay | Admitting: Pulmonary Disease

## 2022-08-11 ENCOUNTER — Other Ambulatory Visit: Payer: PPO

## 2022-08-11 NOTE — Telephone Encounter (Signed)
Spoke to [place] with [facility]. She stated that patient currently has ML6, however it is too much for pt to refill the tank. [facility] provider has recommended [facility]. Advise Stanton Kidney to have patient contact [facility] to start the process. [facility] will send Rx to our office.   Routing to Dr. [name] to make aware.

## 2022-08-14 ENCOUNTER — Telehealth: Payer: Self-pay | Admitting: Pulmonary Disease

## 2022-08-14 DIAGNOSIS — J449 Chronic obstructive pulmonary disease, unspecified: Secondary | ICD-10-CM

## 2022-08-14 NOTE — Telephone Encounter (Signed)
Order has been placed to [facility] for [place]. Patient is aware and voiced his understanding.  Nothing further needed.

## 2022-08-14 NOTE — Telephone Encounter (Signed)
I agree he needs to start process with [facility].  Unsure if he will be able to tolerate pulse delivery.

## 2022-08-14 NOTE — Telephone Encounter (Signed)
Okay to place an order for [facility] for an [facility] or similar POC.

## 2022-08-14 NOTE — Telephone Encounter (Signed)
Spoke to Parker Martinez with [facility] and relayed below message.  She stated that patient will be contacting [facility].  Nothing further needed.

## 2022-08-14 NOTE — Telephone Encounter (Signed)
Dr. [name], please advise if okay to place an order to [facility] for [facility]ogen?

## 2022-08-15 NOTE — Telephone Encounter (Signed)
The order was sent to [facility] per Patients request please see [facility]'s response below [place], [place], Alphonzo Lemm[place], [place]; Learta Codding; [address] [place]; [place], [place],   We currently are not providing POC's- the pt is currently on a conserving device which is the smallest tanks we provide.   Thank you   The [facility] order has now been faxed to [facility]

## 2022-08-17 ENCOUNTER — Ambulatory Visit: Payer: PPO | Admitting: Nurse Practitioner

## 2022-08-17 ENCOUNTER — Encounter: Payer: Self-pay | Admitting: Nurse Practitioner

## 2022-08-17 ENCOUNTER — Telehealth: Payer: Self-pay | Admitting: Family Medicine

## 2022-08-17 DIAGNOSIS — R1310 Dysphagia, unspecified: Secondary | ICD-10-CM | POA: Diagnosis not present

## 2022-08-17 DIAGNOSIS — K222 Esophageal obstruction: Secondary | ICD-10-CM

## 2022-08-17 DIAGNOSIS — D649 Anemia, unspecified: Secondary | ICD-10-CM | POA: Diagnosis not present

## 2022-08-17 DIAGNOSIS — K219 Gastro-esophageal reflux disease without esophagitis: Secondary | ICD-10-CM

## 2022-08-17 NOTE — Telephone Encounter (Signed)
Patient called in stating that he seen Dr. Silvano Rusk today and he wants to scratch his esophagus. He need labs done prior to having that procedure done. His last labs showed Anemia, with no follow up and they need new ones. Please advise. Thank you!

## 2022-08-17 NOTE — Progress Notes (Addendum)
08/17/2022 Parker Martinez [id number] [birth year]   CHIEF COMPLAINT: Difficulty swallowing  HISTORY OF PRESENT ILLNESS: Parker Martinez is an 81 year old male with a past medical history of COPD on home oxygen 2L, presumed stage I non-small cell lung cancer s/p SBRT 05/2022, prostate cancer, right carotid stenosis, peripheral arterial disease (aortoiliac occlusive disease and infrainguinal arterial occlusive disease) followed by vascular surgery, macrocytic anemia, GERD, dysphagia secondary to an esophageal ring which required past dilatation.  He is known by Dr. [name]. He complains of having recurrent dysphagia which has progressively worsened over the past year.  He describes having food or pills which gets stuck to his upper esophagus which results in gagging himself to expel the stuck food twice daily.  He eats soft foods as he is edentulous and does not utilize dentures.  He takes Pantoprazole 40 mg twice daily.  His most recent upper endoscopy was 01/09/2019 which showed web/rings in the lower third of the esophagus which was dilated with 52 AFR with good results. He tends to drink 1 Ensure daily.  No weight loss.  He is passing a normal formed brown bowel movement daily.  No rectal bleeding or black stools.  Labs 04/25/2022 showed a hemoglobin level of 11.8 down from 13.7.  On ASA 81 mg daily.  No other NSAIDs.     Latest Ref Rng & Units 04/25/2022    5:00 PM 11/08/2021   12:51 PM 05/18/2021   12:56 PM  CBC  WBC 4.0 - 10.5 K/uL 9.1  10.9  12.8   Hemoglobin 13.0 - 17.0 g/dL 11.8  13.7  13.5   Hematocrit 39.0 - 52.0 % 34.6  41.8  40.4   Platelets 150.0 - 400.0 K/uL 226.0  218.0  241.0   MCV 97.1 on 04/25/2022.  MCV 100.6 on 11/08/2021.     Latest Ref Rng & Units 04/25/2022    5:00 PM 11/08/2021   12:51 PM 05/18/2021   12:56 PM  CMP  Glucose 70 - 99 mg/dL 95  110  122   BUN 6 - 23 mg/dL 14  16  14    Creatinine 0.40 - 1.50 mg/dL 0.84  0.86  0.84   Sodium 135 - 145 mEq/L 141  140  142    Potassium 3.5 - 5.1 mEq/L 4.1  4.5  5.0   Chloride 96 - 112 mEq/L 102  102  101   CO2 19 - 32 mEq/L 28  29  32   Calcium 8.4 - 10.5 mg/dL 9.5  9.8  9.7   Total Protein 6.0 - 8.3 g/dL  6.5  6.3   Total Bilirubin 0.2 - 1.2 mg/dL  0.7  0.6   Alkaline Phos 39 - 117 U/L  60  70   AST 0 - 37 U/L  24  21   ALT 0 - 53 U/L  29  23     ECHO 07/07/2021: Left ventricular ejection fraction, by estimation, is 55%. The left ventricle has normal function. The left ventricle has no regional wall motion abnormalities. Left ventricular diastolic parameters are consistent with Grade II diastolic dysfunction (pseudonormalization). 1. 2. Right ventricular systolic function is normal. The right ventricular size is normal. 3. The mitral valve is normal in structure. No evidence of mitral valve regurgitation. 4. The aortic valve was not well visualized. Aortic valve regurgitation is not visualized.  MOST RECENT GI PROCEDURES:  EGD 01/09/2019: - Webs/rings in the lower third of the esophagus. Dilated. 60 Fr w/  good result - Erythematous mucosa in the stomach. Duodenum not examined - No specimens collected.  EGD 10/30/2016: - Mild rings x 2 distal esophagus. Dilated 54 Fr (18 mm)- in 2015 were dilated to 16.5 mm. - Gastritis. - The examination was otherwise normal. - No specimens collected.  Colonoscopy 10/30/2016: Severe diverticulosis in the sigmoid colon. There was narrowing of the colon in association with the diverticular opening. - Internal hemorrhoids. - The examination was otherwise normal on direct and retroflexion views. - No specimens collected. - Personal history of colonic polyps. 3small adenomas 2014  Past Medical History:  Diagnosis Date   Abnormal drug screen 10/2013, 03/2015   low Cr, +EtOH, neg hydrocodone (10/2013, again 03/2015)   Allergic rhinitis    Arthritis    lumbar and cervical spine   Carotid stenosis 09/11/2014   40-59% bilat ICA stenosis, rpt 1 yr (09/2014)    COPD  (chronic obstructive pulmonary disease) ([place])    severe, participated in [facility] study   DDD (degenerative disc disease)    lumbar (HNP R L3/4) s/p ESI, cervical   Diverticulitis 2015   by CT   Diverticulosis 2008   severe by colonoscopy and CT   Duodenitis    Esophageal stricture 2012   s/p dilation   GERD (gastroesophageal reflux disease)    Hemangioma    congenital left arm   History of kidney stones remote   HTN (hypertension)    borderline readings at home   Idiopathic anaphylaxis    h/o recurrent anaphylaxis, unknown trigger, s/p eval at [place] [phone]), last episode 03/2011   PAD (peripheral artery disease) ([place])    Personal history of colonic adenomas 09/02/2013   Prostate cancer ([place]) 2011   prostate seed implant (Dr. [name])   Smoker    Status post dilatation of esophageal stricture    Subclavian steal syndrome 10/12/2016   Left subclavian steal, with 18 mmHg brachial artery pressure gradient (10/2016). If symptomatic, rec PV consult   Past Surgical History:  Procedure Laterality Date   BALLOON DILATION N/A 10/02/2014   Procedure: BALLOON DILATION;  Surgeon: Gatha Mayer, MD;  Location: [facility];  Service: Endoscopy;  Laterality: N/A;   CATARACT EXTRACTION W/PHACO Right 01/26/2020   Procedure: CATARACT EXTRACTION PHACO AND INTRAOCULAR LENS PLACEMENT (IOC) RIGHT 5.15  00:42.9;  Surgeon: [name], MD;  Location: [facility];  Service: Ophthalmology;  Laterality: Right;   CATARACT EXTRACTION W/PHACO Left 02/16/2020   Procedure: CATARACT EXTRACTION PHACO AND INTRAOCULAR LENS PLACEMENT (IOC) LEFT 3.87  00:45.4;  Surgeon: [name], MD;  Location: [place];  Service: Ophthalmology;  Laterality: Left;   COLONOSCOPY  08/2013   4 tubular adenomas, severe divertic, rpt 3 yrs [name])   COLONOSCOPY  10/2016   severe diverticulosis, no f/u recommended [name])   ESOPHAGOGASTRODUODENOSCOPY  10/2016   2 rings dilated  [name])   ESOPHAGOGASTRODUODENOSCOPY  01/2019   dilated esophageal webs [name])   ESOPHAGOGASTRODUODENOSCOPY (EGD) WITH PROPOFOL N/A 10/02/2014   WNL, mild esophagitis; Gatha Mayer, MD   INSERTION PROSTATE RADIATION SEED  01/2015   [place] MICRODISCECTOMY Left 03/2019   L L4/L5 [name])   [place]   due to chronic dislocation   SPIROMETRY  10/2010   severe obstruction, FEV1 41%, ratio 0.46   TONSILLECTOMY  1944 ?   UPPER GASTROINTESTINAL ENDOSCOPY  06/08/2009   dilated esophageal stricture, duodenitis   virtual colonoscopy  2007   [name], rpt rec  5 yrs   Social History: He is married.  He has 1 daughter.  Retired.  He smokes cigarettes for 60 years, stopped smoking 3 years ago. He drinks 1 or 2 mixed drinks or beer daily or less.  No drug use.  Family History: Father with history of lung cancer.  Mother with history of arthritis.  Daughter with history of breast cancer.  Father with history of hyperlipidemia.  Allergies  Allergen Reactions   Flonase [Fluticasone Propionate] Other (See Comments)    Per pt, causes rebound effect       Outpatient Encounter Medications as of 08/17/2022  Medication Sig   acetaminophen (TYLENOL) 325 MG tablet Take 1 tablet (325 mg total) by mouth every 6 (six) hours as needed for mild pain, fever or headache (or Fever >/= 101).   albuterol (PROVENTIL) (2.5 MG/3ML) 0.083% nebulizer solution INHALE 1 VIAL VIA NEBULIZER EVERY 4 HOURS AS NEEDED FOR WHEEZING OR SHORTNESS OF BREATH   albuterol (VENTOLIN HFA) 108 (90 Base) MCG/ACT inhaler USE 2 PUFFS BY MOUTH EVERY 6 HOURS AS NEEDED FOR WHEEZING   alfuzosin (UROXATRAL) 10 MG 24 hr tablet Take 10 mg by mouth daily with breakfast.   aspirin 81 MG tablet Take 81 mg by mouth every morning.   atorvastatin (LIPITOR) 40 MG tablet TAKE 1 TABLET BY MOUTH EVERY DAY   Budeson-Glycopyrrol-Formoterol (BREZTRI AEROSPHERE) 160-9-4.8 MCG/ACT AERO Inhale 2 puffs into the lungs 2 (two) times daily.    EPINEPHrine (EPIPEN 2-PAK) 0.3 mg/0.3 mL IJ SOAJ injection Inject 0.3 mg into the muscle as needed for anaphylaxis.   fexofenadine (ALLEGRA) 180 MG tablet Take 180 mg by mouth daily.   gabapentin (NEURONTIN) 100 MG capsule Take 1-2 capsules (100-200 mg total) by mouth at bedtime.   ipratropium (ATROVENT) 0.06 % nasal spray SPRAY 2 SPRAYS INTO EACH NOSTRIL 4 TIMES A DAY AS NEEDED FOR RHINITIS   montelukast (SINGULAIR) 10 MG tablet TAKE 1 TABLET BY MOUTH EVERYDAY AT BEDTIME   Multiple Vitamin (MULTIVITAMIN) tablet Take 1 tablet by mouth daily.   pantoprazole (PROTONIX) 40 MG tablet Take 1 tablet (40 mg total) by mouth 2 (two) times daily before a meal.   predniSONE (DELTASONE) 10 MG tablet TAKE 1 TABLET (10 MG TOTAL) BY MOUTH DAILY WITH BREAKFAST.   Roflumilast 250 MCG TABS TAKE 1 TABLET BY MOUTH EVERY DAY   traMADol (ULTRAM) 50 MG tablet Take 1 tablet (50 mg total) by mouth 2 (two) times daily as needed.   vitamin B-12 (CYANOCOBALAMIN) 500 MCG tablet Take 500 mcg by mouth every morning.    [DISCONTINUED] Budeson-Glycopyrrol-Formoterol (BREZTRI AEROSPHERE) 160-9-4.8 MCG/ACT AERO Inhale 2 puffs into the lungs in the morning and at bedtime.   No facility-administered encounter medications on file as of 08/17/2022.    REVIEW OF SYSTEMS:  Gen: + Fatigue. Denies fever, sweats or chills. No weight loss.  CV: Denies chest pain, palpitations or edema. Resp: + Chronic SOB.  No hemoptysis. GI: See HPI. GU : Denies urinary burning, blood in urine, increased urinary frequency or incontinence. MS: + Muscle cramps, back pain.  Derm: Denies rash, itchiness, skin lesions or unhealing ulcers. Psych: Denies depression, anxiety, memory loss, suicidal ideation and confusion. Heme: Denies bruising, easy bleeding. Neuro:  Denies headaches, dizziness or paresthesias. Endo:  Denies any problems with DM, thyroid or adrenal function.  PHYSICAL EXAM: BP 115/80   Pulse (!) 120   Ht 5' 8.5" (1.74 m)   Wt 150 lb 4  oz (68.2 kg)   BMI 22.51  kg/m  General: 81 year old male somewhat frail-appearing utilizing double oxygen 3 L nasal cannula at this time. Head: Normocephalic and atraumatic. Eyes:  Sclerae non-icteric, conjunctive pink. Ears: Normal auditory acuity. Mouth: Dentition intact. No ulcers or lesions.  Neck: Supple, no lymphadenopathy or thyromegaly.  Lungs: Clear bilaterally to auscultation without wheezes, crackles or rhonchi. Heart: Regular rate and rhythm. No murmur, rub or gallop appreciated.  Abdomen: Soft, nontender, non distended. No masses. No hepatosplenomegaly. Normoactive bowel sounds x 4 quadrants.  Rectal: Deferred. Musculoskeletal: Symmetrical with no gross deformities. Skin: Warm and dry. No rash or lesions on visible extremities. Extremities: No edema. Neurological: Alert oriented x 4, no focal deficits.  Psychological:  Alert and cooperative. Normal mood and affect.  ASSESSMENT AND PLAN:  23) 81 year old male with GERD, esophageal rings with recurrent dysphagia which has progressively worsened over the past year, he self induces gagging to expel stuck food twice daily.  -Continue Pantoprazole 40 mg twice daily -EGD with esophageal dilatation at [facility] (patient requires oxygen 2L [place] at home and 3L [place] when away from home).  Discussed case further with Dr. [name] before scheduling. -Recommended soft, pured diet -Patient instructed to go to the ED if food becomes stuck in his esophagus which does not come out or pass down the esophagus  2) Normocytic - macrocytic anemia. Hg 13.7 -> 11.8. No overt GI bleeding.  -CBC, IBC + ferritin, B12 and folate level.  Note, I reviewed his 04/25/2022 labs after the patient was discharged from his appointment today.  I contacted the patient and explained his prior labs showed a drop in his hemoglobin level and further laboratory studies were recommended.  I entered the lab orders as noted above, he prefers to have these laboratory studies done at  his PCPs office which is more convenient.  3) COPD  stage IV with moderate respiratory failure on continuous oxygen   4) Non-small cell lung cancer s/p SBRT 05/2022 per radiation oncology.  Left upper lung lesions not biopsies as he is too high risk.     CC:  Ria Bush, MD  Primary GI attending  We should do ba swallow to scout this (has had radiation)  Would go ahead and try to schedule EGD/dilation with fluoroscopy - see if we can add on to 09/11/22 session (my next available)

## 2022-08-17 NOTE — Patient Instructions (Signed)
If you are age 82 or older, your body mass index should be between 23-30. Your Body mass index is 22.51 kg/m. If this is out of the aforementioned range listed, please consider follow up with your Primary Care Provider.  If you are age 41 or younger, your body mass index should be between 19-25. Your Body mass index is 22.51 kg/m. If this is out of the aformentioned range listed, please consider follow up with your Primary Care Provider.   We will call you to schedule a procedure at the hospital with Dr.[name] once we have a date.  Ensure 1-2 cans daily.   Please go to the emergency department if food gets stuck in the Esophagus and does not pass.   The [facility] providers would like to encourage you to use [facility] to communicate with providers for non-urgent requests or questions.  Due to long hold times on the telephone, sending your provider a message by [facility] may be a faster and more efficient way to get a response.  Please allow 48 business hours for a response.  Please remember that this is for non-urgent requests.   It was a pleasure to see you today!  Thank you for trusting me with your gastrointestinal care!

## 2022-08-17 NOTE — H&P (View-Only) (Signed)
08/17/2022 Parker Martinez [id number] 1941/06/03   CHIEF COMPLAINT: Difficulty swallowing  HISTORY OF PRESENT ILLNESS: Parker Martinez is an 81 year old male with a past medical history of COPD on home oxygen 2L, presumed stage I non-small cell lung cancer s/p SBRT 05/2022, prostate cancer, right carotid stenosis, peripheral arterial disease (aortoiliac occlusive disease and infrainguinal arterial occlusive disease) followed by vascular surgery, macrocytic anemia, GERD, dysphagia secondary to an esophageal ring which required past dilatation.  He is known by Dr. [name]. He complains of having recurrent dysphagia which has progressively worsened over the past year.  He describes having food or pills which gets stuck to his upper esophagus which results in gagging himself to expel the stuck food twice daily.  He eats soft foods as he is edentulous and does not utilize dentures.  He takes Pantoprazole 40 mg twice daily.  His most recent upper endoscopy was 01/09/2019 which showed web/rings in the lower third of the esophagus which was dilated with 52 AFR with good results. He tends to drink 1 Ensure daily.  No weight loss.  He is passing a normal formed brown bowel movement daily.  No rectal bleeding or black stools.  Labs 04/25/2022 showed a hemoglobin level of 11.8 down from 13.7.  On ASA 81 mg daily.  No other NSAIDs.     Latest Ref Rng & Units 04/25/2022    5:00 PM 11/08/2021   12:51 PM 05/18/2021   12:56 PM  CBC  WBC 4.0 - 10.5 K/uL 9.1  10.9  12.8   Hemoglobin 13.0 - 17.0 g/dL 11.8  13.7  13.5   Hematocrit 39.0 - 52.0 % 34.6  41.8  40.4   Platelets 150.0 - 400.0 K/uL 226.0  218.0  241.0   MCV 97.1 on 04/25/2022.  MCV 100.6 on 11/08/2021.     Latest Ref Rng & Units 04/25/2022    5:00 PM 11/08/2021   12:51 PM 05/18/2021   12:56 PM  CMP  Glucose 70 - 99 mg/dL 95  110  122   BUN 6 - 23 mg/dL 14  16  14    Creatinine 0.40 - 1.50 mg/dL 0.84  0.86  0.84   Sodium 135 - 145 mEq/L 141  140  142    Potassium 3.5 - 5.1 mEq/L 4.1  4.5  5.0   Chloride 96 - 112 mEq/L 102  102  101   CO2 19 - 32 mEq/L 28  29  32   Calcium 8.4 - 10.5 mg/dL 9.5  9.8  9.7   Total Protein 6.0 - 8.3 g/dL  6.5  6.3   Total Bilirubin 0.2 - 1.2 mg/dL  0.7  0.6   Alkaline Phos 39 - 117 U/L  60  70   AST 0 - 37 U/L  24  21   ALT 0 - 53 U/L  29  23     ECHO 07/07/2021: Left ventricular ejection fraction, by estimation, is 55%. The left ventricle has normal function. The left ventricle has no regional wall motion abnormalities. Left ventricular diastolic parameters are consistent with Grade II diastolic dysfunction (pseudonormalization). 1. 2. Right ventricular systolic function is normal. The right ventricular size is normal. 3. The mitral valve is normal in structure. No evidence of mitral valve regurgitation. 4. The aortic valve was not well visualized. Aortic valve regurgitation is not visualized.  MOST RECENT GI PROCEDURES:  EGD 01/09/2019: - Webs/rings in the lower third of the esophagus. Dilated. 36 Fr w/  good result - Erythematous mucosa in the stomach. Duodenum not examined - No specimens collected.  EGD 10/30/2016: - Mild rings x 2 distal esophagus. Dilated 54 Fr (18 mm)- in 2015 were dilated to 16.5 mm. - Gastritis. - The examination was otherwise normal. - No specimens collected.  Colonoscopy 10/30/2016: Severe diverticulosis in the sigmoid colon. There was narrowing of the colon in association with the diverticular opening. - Internal hemorrhoids. - The examination was otherwise normal on direct and retroflexion views. - No specimens collected. - Personal history of colonic polyps. 3small adenomas 2014  Past Medical History:  Diagnosis Date   Abnormal drug screen 10/2013, 03/2015   low Cr, +EtOH, neg hydrocodone (10/2013, again 03/2015)   Allergic rhinitis    Arthritis    lumbar and cervical spine   Carotid stenosis 09/11/2014   40-59% bilat ICA stenosis, rpt 1 yr (09/2014)    COPD  (chronic obstructive pulmonary disease) ([place])    severe, participated in [facility] study   DDD (degenerative disc disease)    lumbar (HNP R L3/4) s/p ESI, cervical   Diverticulitis 2015   by CT   Diverticulosis 2008   severe by colonoscopy and CT   Duodenitis    Esophageal stricture 2012   s/p dilation   GERD (gastroesophageal reflux disease)    Hemangioma    congenital left arm   History of kidney stones remote   HTN (hypertension)    borderline readings at home   Idiopathic anaphylaxis    h/o recurrent anaphylaxis, unknown trigger, s/p eval at [place] [phone]), last episode 03/2011   PAD (peripheral artery disease) ([place])    Personal history of colonic adenomas 09/02/2013   Prostate cancer ([place]) 2011   prostate seed implant (Dr. [name])   Smoker    Status post dilatation of esophageal stricture    Subclavian steal syndrome 10/12/2016   Left subclavian steal, with 18 mmHg brachial artery pressure gradient (10/2016). If symptomatic, rec PV consult   Past Surgical History:  Procedure Laterality Date   BALLOON DILATION N/A 10/02/2014   Procedure: BALLOON DILATION;  Surgeon: Gatha Mayer, MD;  Location: [facility];  Service: Endoscopy;  Laterality: N/A;   CATARACT EXTRACTION W/PHACO Right 01/26/2020   Procedure: CATARACT EXTRACTION PHACO AND INTRAOCULAR LENS PLACEMENT (IOC) RIGHT 5.15  00:42.9;  Surgeon: [name], MD;  Location: [place];  Service: Ophthalmology;  Laterality: Right;   CATARACT EXTRACTION W/PHACO Left 02/16/2020   Procedure: CATARACT EXTRACTION PHACO AND INTRAOCULAR LENS PLACEMENT (IOC) LEFT 3.87  00:45.4;  Surgeon: [name], MD;  Location: [place];  Service: Ophthalmology;  Laterality: Left;   COLONOSCOPY  08/2013   4 tubular adenomas, severe divertic, rpt 3 yrs [name])   COLONOSCOPY  10/2016   severe diverticulosis, no f/u recommended [name])   ESOPHAGOGASTRODUODENOSCOPY  10/2016   2 rings dilated  [name])   ESOPHAGOGASTRODUODENOSCOPY  01/2019   dilated esophageal webs [name])   ESOPHAGOGASTRODUODENOSCOPY (EGD) WITH PROPOFOL N/A 10/02/2014   WNL, mild esophagitis; Gatha Mayer, MD   INSERTION PROSTATE RADIATION SEED  01/2015   [place] MICRODISCECTOMY Left 03/2019   L L4/L5 [name])   [place]   due to chronic dislocation   SPIROMETRY  10/2010   severe obstruction, FEV1 41%, ratio 0.46   TONSILLECTOMY  1944 ?   UPPER GASTROINTESTINAL ENDOSCOPY  06/08/2009   dilated esophageal stricture, duodenitis   virtual colonoscopy  2007   [name], rpt rec  5 yrs   Social History: He is married.  He has 1 daughter.  Retired.  He smokes cigarettes for 60 years, stopped smoking 3 years ago. He drinks 1 or 2 mixed drinks or beer daily or less.  No drug use.  Family History: Father with history of lung cancer.  Mother with history of arthritis.  Daughter with history of breast cancer.  Father with history of hyperlipidemia.  Allergies  Allergen Reactions   Flonase [Fluticasone Propionate] Other (See Comments)    Per pt, causes rebound effect       Outpatient Encounter Medications as of 08/17/2022  Medication Sig   acetaminophen (TYLENOL) 325 MG tablet Take 1 tablet (325 mg total) by mouth every 6 (six) hours as needed for mild pain, fever or headache (or Fever >/= 101).   albuterol (PROVENTIL) (2.5 MG/3ML) 0.083% nebulizer solution INHALE 1 VIAL VIA NEBULIZER EVERY 4 HOURS AS NEEDED FOR WHEEZING OR SHORTNESS OF BREATH   albuterol (VENTOLIN HFA) 108 (90 Base) MCG/ACT inhaler USE 2 PUFFS BY MOUTH EVERY 6 HOURS AS NEEDED FOR WHEEZING   alfuzosin (UROXATRAL) 10 MG 24 hr tablet Take 10 mg by mouth daily with breakfast.   aspirin 81 MG tablet Take 81 mg by mouth every morning.   atorvastatin (LIPITOR) 40 MG tablet TAKE 1 TABLET BY MOUTH EVERY DAY   Budeson-Glycopyrrol-Formoterol (BREZTRI AEROSPHERE) 160-9-4.8 MCG/ACT AERO Inhale 2 puffs into the lungs 2 (two) times daily.    EPINEPHrine (EPIPEN 2-PAK) 0.3 mg/0.3 mL IJ SOAJ injection Inject 0.3 mg into the muscle as needed for anaphylaxis.   fexofenadine (ALLEGRA) 180 MG tablet Take 180 mg by mouth daily.   gabapentin (NEURONTIN) 100 MG capsule Take 1-2 capsules (100-200 mg total) by mouth at bedtime.   ipratropium (ATROVENT) 0.06 % nasal spray SPRAY 2 SPRAYS INTO EACH NOSTRIL 4 TIMES A DAY AS NEEDED FOR RHINITIS   montelukast (SINGULAIR) 10 MG tablet TAKE 1 TABLET BY MOUTH EVERYDAY AT BEDTIME   Multiple Vitamin (MULTIVITAMIN) tablet Take 1 tablet by mouth daily.   pantoprazole (PROTONIX) 40 MG tablet Take 1 tablet (40 mg total) by mouth 2 (two) times daily before a meal.   predniSONE (DELTASONE) 10 MG tablet TAKE 1 TABLET (10 MG TOTAL) BY MOUTH DAILY WITH BREAKFAST.   Roflumilast 250 MCG TABS TAKE 1 TABLET BY MOUTH EVERY DAY   traMADol (ULTRAM) 50 MG tablet Take 1 tablet (50 mg total) by mouth 2 (two) times daily as needed.   vitamin B-12 (CYANOCOBALAMIN) 500 MCG tablet Take 500 mcg by mouth every morning.    [DISCONTINUED] Budeson-Glycopyrrol-Formoterol (BREZTRI AEROSPHERE) 160-9-4.8 MCG/ACT AERO Inhale 2 puffs into the lungs in the morning and at bedtime.   No facility-administered encounter medications on file as of 08/17/2022.    REVIEW OF SYSTEMS:  Gen: + Fatigue. Denies fever, sweats or chills. No weight loss.  CV: Denies chest pain, palpitations or edema. Resp: + Chronic SOB.  No hemoptysis. GI: See HPI. GU : Denies urinary burning, blood in urine, increased urinary frequency or incontinence. MS: + Muscle cramps, back pain.  Derm: Denies rash, itchiness, skin lesions or unhealing ulcers. Psych: Denies depression, anxiety, memory loss, suicidal ideation and confusion. Heme: Denies bruising, easy bleeding. Neuro:  Denies headaches, dizziness or paresthesias. Endo:  Denies any problems with DM, thyroid or adrenal function.  PHYSICAL EXAM: BP 115/80   Pulse (!) 120   Ht 5' 8.5" (1.74 m)   Wt 150 lb 4  oz (68.2 kg)   BMI 22.51  kg/m  General: 81 year old male somewhat frail-appearing utilizing double oxygen 3 L nasal cannula at this time. Head: Normocephalic and atraumatic. Eyes:  Sclerae non-icteric, conjunctive pink. Ears: Normal auditory acuity. Mouth: Dentition intact. No ulcers or lesions.  Neck: Supple, no lymphadenopathy or thyromegaly.  Lungs: Clear bilaterally to auscultation without wheezes, crackles or rhonchi. Heart: Regular rate and rhythm. No murmur, rub or gallop appreciated.  Abdomen: Soft, nontender, non distended. No masses. No hepatosplenomegaly. Normoactive bowel sounds x 4 quadrants.  Rectal: Deferred. Musculoskeletal: Symmetrical with no gross deformities. Skin: Warm and dry. No rash or lesions on visible extremities. Extremities: No edema. Neurological: Alert oriented x 4, no focal deficits.  Psychological:  Alert and cooperative. Normal mood and affect.  ASSESSMENT AND PLAN:  75) 81 year old male with GERD, esophageal rings with recurrent dysphagia which has progressively worsened over the past year, he self induces gagging to expel stuck food twice daily.  -Continue Pantoprazole 40 mg twice daily -EGD with esophageal dilatation at [facility] (patient requires oxygen 2L [place] at home and 3L [place] when away from home).  Discussed case further with Dr. [name] before scheduling. -Recommended soft, pured diet -Patient instructed to go to the ED if food becomes stuck in his esophagus which does not come out or pass down the esophagus  2) Normocytic - macrocytic anemia. Hg 13.7 -> 11.8. No overt GI bleeding.  -CBC, IBC + ferritin, B12 and folate level.  Note, I reviewed his 04/25/2022 labs after the patient was discharged from his appointment today.  I contacted the patient and explained his prior labs showed a drop in his hemoglobin level and further laboratory studies were recommended.  I entered the lab orders as noted above, he prefers to have these laboratory studies done at  his PCPs office which is more convenient.  3) COPD  stage IV with moderate respiratory failure on continuous oxygen   4) Non-small cell lung cancer s/p SBRT 05/2022 per radiation oncology.  Left upper lung lesions not biopsies as he is too high risk.     CC:  Ria Bush, MD  Primary GI attending  We should do ba swallow to scout this (has had radiation)  Would go ahead and try to schedule EGD/dilation with fluoroscopy - see if we can add on to 09/11/22 session (my next available)

## 2022-08-18 ENCOUNTER — Other Ambulatory Visit: Payer: Self-pay

## 2022-08-18 ENCOUNTER — Telehealth: Payer: Self-pay

## 2022-08-18 DIAGNOSIS — R1310 Dysphagia, unspecified: Secondary | ICD-10-CM

## 2022-08-18 DIAGNOSIS — K222 Esophageal obstruction: Secondary | ICD-10-CM

## 2022-08-18 NOTE — Telephone Encounter (Signed)
[facility] ordered labs - he may come here to get them drawn.

## 2022-08-18 NOTE — Telephone Encounter (Signed)
Noted  

## 2022-08-18 NOTE — Progress Notes (Signed)
Dr. Celesta Aver addendum as follows:  Gatha Mayer, MD  Noralyn Pick, NP Please order a ba swallow for more info   Also let's see if hospital will let me add on for 09/11/22   EGD with fluoro (just in case)   Johny Chess, refer to Dr. Celesta Aver addendum. Pls call patient and schedule him for a barium swallow (does not need tablet). DX dysphagia and pls enter recent radiation for lung cancer in comment section of order. Barium swallow needs to be done prior to his EGD.   Contact [facility] endo and see if they can add on an EGD with fluoro with Dr. [name] 09/11/2022. Pls let Dr. [name] know outcome regarding EGD scheduling. Thx.

## 2022-08-18 NOTE — Telephone Encounter (Signed)
Left a message on voicemail for patient to call the office back. Okay to schedule lab appointment when patient calls the office back.

## 2022-08-18 NOTE — Telephone Encounter (Signed)
Patient called back in and he is scheduled for labs.

## 2022-08-18 NOTE — Telephone Encounter (Signed)
-----   Message from Noralyn Pick, NP sent at 08/18/2022  8:02 AM EDT -----    ----- Message ----- From: Gatha Mayer, MD Sent: 08/17/2022   5:26 PM EDT To: Noralyn Pick, NP  Please order a ba swallow for more info  Also let's see if hospital will let me add on for 09/11/22  EGD with fluoro (just in case)  Glendell Docker ----- Message ----- From: Noralyn Pick, NP Sent: 08/17/2022   4:48 PM EDT To: Gatha Mayer, MD  Dr. [name], challenging situation.  He needs his esophagus dilated as he is having difficulty swallowing on a daily basis.  Worsening COPD now on home oxygen and was diagnosed with non-small cell lung cancer status post recent radiation.  Please let me know if you want an official pulmonary clearance and if you are okay to do his EGD at [facility] and when. Thank you

## 2022-08-18 NOTE — Telephone Encounter (Signed)
Pt was made aware of Dr. [name] recommendations: Pt was scheduled for a Barium swallow on 08/25/2022 at 10:00 AM  pt to arrive at 9:30 AM at [facility]: Pt to have nothing to eat or drink 3 hours prior: Pt made aware   [place] contacted and spoke with Santiago Glad and notified her of Dr. [name] request: Santiago Glad stated that there was time for the EGD with Fluoro: Orders were entered and pt was scheduled for 11:30 AM on Oct 9th with Dr. [name]: Pt was made aware: Ambulatory GI referral Placed in Epic Prep instructions sent to pt via my chart and pt made aware Pt verbalized understanding with all questions answered.

## 2022-08-19 DIAGNOSIS — J449 Chronic obstructive pulmonary disease, unspecified: Secondary | ICD-10-CM | POA: Diagnosis not present

## 2022-08-21 ENCOUNTER — Other Ambulatory Visit (INDEPENDENT_AMBULATORY_CARE_PROVIDER_SITE_OTHER): Payer: PPO

## 2022-08-21 ENCOUNTER — Encounter: Payer: Self-pay | Admitting: Pulmonary Disease

## 2022-08-21 DIAGNOSIS — D649 Anemia, unspecified: Secondary | ICD-10-CM

## 2022-08-21 LAB — COMPREHENSIVE METABOLIC PANEL
ALT: 17 U/L (ref 0–53)
AST: 16 U/L (ref 0–37)
Albumin: 3.4 g/dL — ABNORMAL LOW (ref 3.5–5.2)
Alkaline Phosphatase: 67 U/L (ref 39–117)
BUN: 18 mg/dL (ref 6–23)
CO2: 30 mEq/L (ref 19–32)
Calcium: 9.7 mg/dL (ref 8.4–10.5)
Chloride: 100 mEq/L (ref 96–112)
Creatinine, Ser: 0.85 mg/dL (ref 0.40–1.50)
GFR: 81.08 mL/min (ref >60.00)
Glucose, Bld: 176 mg/dL — ABNORMAL HIGH (ref 70–99)
Potassium: 4.2 mEq/L (ref 3.5–5.1)
Sodium: 141 mEq/L (ref 135–145)
Total Bilirubin: 0.4 mg/dL (ref 0.2–1.2)
Total Protein: 6 g/dL (ref 6.0–8.3)

## 2022-08-21 LAB — CBC
HCT: 35.4 % — ABNORMAL LOW (ref 39.0–52.0)
Hemoglobin: 12 g/dL — ABNORMAL LOW (ref 13.0–17.0)
MCHC: 34 g/dL (ref 30.0–36.0)
MCV: 96.8 fl (ref 78.0–100.0)
Platelets: 240 10*3/uL (ref 150.0–400.0)
RBC: 3.66 Mil/uL — ABNORMAL LOW (ref 4.22–5.81)
RDW: 13.3 % (ref 11.5–15.5)
WBC: 11.6 10*3/uL — ABNORMAL HIGH (ref 4.0–10.5)

## 2022-08-21 LAB — IBC + FERRITIN
Ferritin: 181.3 ng/mL (ref 22.0–322.0)
Iron: 53 ug/dL (ref 42–165)
Saturation Ratios: 23.1 % (ref 20.0–50.0)
TIBC: 229.6 ug/dL — ABNORMAL LOW (ref 250.0–450.0)
Transferrin: 164 mg/dL — ABNORMAL LOW (ref 212.0–360.0)

## 2022-08-21 LAB — FOLATE: Folate: >23.9 ng/mL (ref >5.9)

## 2022-08-21 LAB — VITAMIN B12: Vitamin B-12: >1500 pg/mL — ABNORMAL HIGH (ref 211–911)

## 2022-08-23 ENCOUNTER — Ambulatory Visit: Payer: PPO | Admitting: Pulmonary Disease

## 2022-08-23 ENCOUNTER — Encounter: Payer: Self-pay | Admitting: Pulmonary Disease

## 2022-08-23 VITALS — BP 128/58 | HR 110 | Temp 98.0°F | Ht 68.5 in | Wt 150.0 lb

## 2022-08-23 DIAGNOSIS — J449 Chronic obstructive pulmonary disease, unspecified: Secondary | ICD-10-CM

## 2022-08-23 DIAGNOSIS — J9611 Chronic respiratory failure with hypoxia: Secondary | ICD-10-CM

## 2022-08-23 DIAGNOSIS — R0609 Other forms of dyspnea: Secondary | ICD-10-CM

## 2022-08-23 MED ORDER — BREZTRI AEROSPHERE 160-9-4.8 MCG/ACT IN AERO: 2.0000 | INHALATION_SPRAY | Freq: Two times a day (BID) | RESPIRATORY_TRACT | 0 refills | Status: DC

## 2022-08-23 NOTE — Patient Instructions (Signed)
We have provided you some Breztri samples.   We will see you in follow-up in 3 months time call sooner should any new problems arise.

## 2022-08-23 NOTE — Progress Notes (Signed)
Subjective:    Patient ID: Parker Martinez, male    DOB: [birth year], 81 y.o.   MRN: [id number] Patient Care Team: Ria Bush, MD as PCP - General (Family Medicine) [name], MD as Consulting Physician (Ophthalmology) Angelia Mould, MD as Consulting Physician (Vascular Surgery) Gatha Mayer, MD as Consulting Physician (Gastroenterology) Myrlene Broker, MD as Attending Physician (Urology) Debbora Dus, [facility] as Pharmacist (Pharmacist) Tyler Pita, MD as Consulting Physician (Pulmonary Disease)  Chief Complaint  Patient presents with   Follow-up    Breathing is unchanged. He has some cough with clear sputum, occ wheezing. He is using his albuterol inhaler daily.     HPI Parker Martinez is an 81 year old former smoker (quit January 2023, 60 PY) with very severe COPD and chronic debilitating dyspnea. He was last seen on the June 2023.  At a prior visit we had recommended chest vest to help with clearance of secretions however, the patient did not want to proceed due to cost.  He has been placed on Daliresp and this has helped with his secretion clearance.  Also uses Acapella flutter valve.  He has had issues with left upper lobe nodules that on prior CT showed very low grade activity likely related to inflammatory change/inflammation.  However he had a chest CT on 21 February 2022 to follow-up on these lesions and unfortunately he has had growth on the left upper lobe nodule which is highly suspicious for malignancy.  I discussed with the patient that he is not a candidate for invasive procedures however, he underwent PET/CT on 28 April which showed activity on the left upper lobe pulmonary lesion that was consistent with malignancy.  He was referred to radiation oncology for empiric SBRT given that he is not a candidate for invasive procedures nor for operative procedures.  Pleated SBRT to that area without any sequela.  Overall he actually looks fairly good today. He  presents in transport chair due to dyspnea. Does not endorse any new symptomatology.  Secretions are better since starting on Daliresp and using Acapella regularly.  He still prefers Breztri to any other medication for his COPD.  He has not had any hemoptysis.  Weight has been stable. He is compliant with oxygen at 2 L/min pulse.  Notes that this therapy helps him.  He does not endorse any new issues today.  DATA: 10/19/10 Spirometry: Severe obstruction (FEV1 41% predicted) 05/28/18 PFTs: FVC: 2.13 > 2.96 L (58 > 80 %pred), FEV1: 0.85 > 1.04 L (30 > 36 %pred), FEV1/FVC: 40 >35%, TLC: 7.90 L (128 %pred), DLCO 50 %pred.  Flow volume curve consistent with severe obstruction 02/20/19 PFTs: FEV1 was 1.96 L or 48% predicted, FVC was 2.72 L or 95% predicted, FEV1/FVC was 38%.  TLC  131%, RV 165%, indicating hyperinflation and air trapping.  Diffusion capacity was 48%.  Findings consistent with severe obstruction and emphysema. 05/05/20 Overnight oximetry: Shows nocturnal desaturations and possible obstructive sleep apnea.  Patient will need sleep study 06/01/20 2D echo: LVEF 60 to 65%, no evidence of cor pulmonale, mild aortic sclerosis, diastolic dysfunction type I. 06/17/2020 chest x-ray two-view: Chronic COPD changes, upper lobe parenchymal scarring, right upper lobe nodules, stable. 10/21/2020 overnight oximetry: Significant desaturations during the night oxygen desaturation index 24 oxygen desaturation events 188.  Low O2 sats 85%, patient will require oxygen at 2 L/min nocturnally. 07/27/2021 echocardiogram: LVEF 55%, no wall motion abnormalities, grade II DD, RV function normal.  No overt valvulopathy.  Since prior  2D echo diastolic dysfunction has worsened. 09/30/2021 CT chest: Emphysematous changes, multiple nodules, mucous retention, focal bronchiectatic changes on left. 10/20/2021 PET/CT: The left-sided nodules have very low-grade activity likely related to apical scarring suggest short-term  follow-up. 02/21/2022 chest CT without contrast: Severe pulmonary emphysema, enlargement of LEFT upper lobe nodule along bandlike changes, stable left upper lobe nodule and cystic area within nodularity of the left upper lobe nodule, new LEFT lower lobe nodule consistent with infection but needs attention on follow-up, areas of groundglass on the RIGHT upper lobe. 03/31/2022 PET/CT: The left upper lobe pulmonary lesion is now hypermetabolic and consistent with primary lung neoplasm  Review of Systems A 10 point review of systems was performed and it is as noted above otherwise negative.  Patient Active Problem List   Diagnosis Date Noted   Non-small cell lung cancer ([place]) 05/10/2022   Pain and swelling of right lower leg 04/12/2022   Palliative care status 11/08/2021   Pulmonary hypertension ([place]) 05/19/2021   Orthostatic dizziness 05/19/2021   Chronic respiratory failure with hypoxia ([place]) 02/25/2021   Protein-calorie malnutrition ([place]) 11/04/2020   Tachycardia 10/20/2020   Right foot drop 09/21/2020   Pedal edema 09/07/2020   Aorto-iliac atherosclerosis ([place]) 01/25/2019   Left lumbar radiculopathy 01/24/2019   Bilateral hearing loss 10/21/2018   BPV (benign positional vertigo), right 09/09/2018   Heart murmur 09/09/2018   Encounter for chronic pain management 05/15/2017   Chronic pain in left shoulder 01/30/2017   Subclavian steal syndrome 10/12/2016   Atherosclerosis of native arteries of extremity with intermittent claudication ([place]) 10/21/2014   Medicare annual wellness visit, subsequent 09/11/2014   Carotid atherosclerosis, bilateral 09/11/2014   Advanced care planning/counseling discussion 09/11/2014   Vitamin D deficiency 09/01/2014   DDD (degenerative disc disease), lumbar    Personal history of colonic adenomas 09/02/2013   COPD exacerbation ([place]) 03/04/2013   Encounter for general adult medical examination with abnormal findings 04/25/2012   Polycythemia secondary to  smoking 04/25/2012   Dyslipidemia 04/25/2012   PVD (peripheral vascular disease) with claudication (HCC)    COPD, severe (HCC)    Prostate cancer (HCC)    GERD (gastroesophageal reflux disease)    Idiopathic anaphylaxis    Smoker 09/13/2011   Esophageal rings - lower esophagus 06/04/2009   Social History   Tobacco Use   Smoking status: Former    Packs/day: 1.00    Years: 60.00    Total pack years: 60.00    Types: Cigarettes    Quit date: 12/13/2021    Years since quitting: 1.1   Smokeless tobacco: Never   Tobacco comments:            5-6 cigs pd- 12/13/21     Quit in January   Substance Use Topics   Alcohol use: Yes    Alcohol/week: 14.0 standard drinks of alcohol    Types: 14 Cans of beer per week    Comment: socially   Allergies  Allergen Reactions   Flonase [Fluticasone Propionate] Other (See Comments)    Per pt, causes rebound effect    Current Meds  Medication Sig   acetaminophen (TYLENOL) 325 MG tablet Take 1 tablet (325 mg total) by mouth every 6 (six) hours as needed for mild pain, fever or headache (or Fever >/= 101).   albuterol (VENTOLIN HFA) 108 (90 Base) MCG/ACT inhaler USE 2 PUFFS BY MOUTH EVERY 6 HOURS AS NEEDED FOR WHEEZING   alfuzosin (UROXATRAL) 10 MG 24 hr tablet Take 10 mg by mouth daily  with breakfast.   aspirin 81 MG tablet Take 81 mg by mouth every morning.   Budeson-Glycopyrrol-Formoterol (BREZTRI AEROSPHERE) 160-9-4.8 MCG/ACT AERO Inhale 2 puffs into the lungs 2 (two) times daily.   EPINEPHrine (EPIPEN 2-PAK) 0.3 mg/0.3 mL IJ SOAJ injection Inject 0.3 mg into the muscle as needed for anaphylaxis.   fexofenadine (ALLEGRA) 180 MG tablet Take 180 mg by mouth in the morning.   ipratropium (ATROVENT) 0.06 % nasal spray SPRAY 2 SPRAYS INTO EACH NOSTRIL 4 TIMES A DAY AS NEEDED FOR RHINITIS   Multiple Vitamin (MULTIVITAMIN) tablet Take 1 tablet by mouth in the morning.   Roflumilast 250 MCG TABS TAKE 1 TABLET BY MOUTH EVERY DAY (Patient not taking:  Reported on 12/29/2022)   vitamin B-12 (CYANOCOBALAMIN) 500 MCG tablet Take 500 mcg by mouth every morning.    albuterol (PROVENTIL) (2.5 MG/3ML) 0.083% nebulizer solution INHALE 1 VIAL VIA NEBULIZER EVERY 4 HOURS AS NEEDED FOR WHEEZING OR SHORTNESS OF BREATH   atorvastatin (LIPITOR) 40 MG tablet TAKE 1 TABLET BY MOUTH EVERY DAY   gabapentin (NEURONTIN) 100 MG capsule Take 1-2 capsules (100-200 mg total) by mouth at bedtime. (Patient taking differently: Take 100 mg by mouth at bedtime.)   montelukast (SINGULAIR) 10 MG tablet TAKE 1 TABLET BY MOUTH EVERYDAY AT BEDTIME   pantoprazole (PROTONIX) 40 MG tablet Take 1 tablet (40 mg total) by mouth 2 (two) times daily before a meal.   predniSONE (DELTASONE) 10 MG tablet TAKE 1 TABLET (10 MG TOTAL) BY MOUTH DAILY WITH BREAKFAST.   traMADol (ULTRAM) 50 MG tablet Take 1 tablet (50 mg total) by mouth 2 (two) times daily as needed.  Patient also on [place]   Immunization History  Administered Date(s) Administered   Fluad Quad(high Dose 65+) 09/09/2019, 09/07/2020   Influenza, High Dose Seasonal PF 09/07/2021   Influenza,inj,Quad PF,6+ Mos 09/11/2014, 09/24/2015, 09/20/2016, 09/14/2017, 08/29/2018   Influenza-Unspecified 10/23/2022   PFIZER(Purple Top)SARS-COV-2 Vaccination 12/20/2019, 01/10/2020, 09/05/2020   Pfizer Covid-19 Vaccine Bivalent Booster 77yr & up 09/07/2021   Pneumococcal Conjugate-13 09/11/2014   Pneumococcal Polysaccharide-23 12/04/2009      Objective:   Physical Exam BP (!) 128/58 (BP Location: Left Arm, Cuff Size: Normal)   Pulse (!) 110   Temp 98 F (36.7 C) (Oral)   Ht 5' 8.5" (1.74 m)   Wt 150 lb (68 kg)   SpO2 94% Comment: on 3lpm pulsed  BMI 22.48 kg/m   SpO2: 94 % (on 3lpm pulsed) O2 Device: Nasal cannula O2 Flow Rate (L/min): 3 L/min O2 Type: Pulse O2  GENERAL: Well-groomed elderly male, no acute distress.  Use of accessories, chronic.  Presents in transport chair (due to dyspnea).  No conversational  dyspnea. HEAD: Normocephalic, atraumatic. EYES: Pupils equal, round, reactive to light.  No scleral icterus. MOUTH: Nose/mouth/throat not examined due to masking requirements for COVID 19. NECK: Supple. No thyromegaly. Trachea midline. No JVD.  No adenopathy. PULMONARY: Distant breath sounds, poor air movement, coarse breath sounds with no wheezes noted.  + Hoover's sign. CARDIOVASCULAR: S1 and S2.  Tachycardic rate with regular rhythm.    Grade 1/6 to 2/6 systolic ejection murmur left sternal border. GASTROINTESTINAL: Benign. MUSCULOSKELETAL: No joint deformity, no clubbing, no edema. NEUROLOGIC: No overt focal deficit noted.  Speech is fluent. SKIN: No rashes, port wine nevus on middle finger left hand. PSYCH: Mood and behavior are normal.    Assessment & Plan:     ICD-10-CM   1. Stage 4 very severe COPD by GOLD classification ([place]  J44.9    Continue Breztri 2 puffs twice a day Continue as needed albuterol    2. Chronic respiratory failure with hypoxia (HCC)  J96.11    Continue oxygen at 2 L/min Patient compliant with therapy    3. Severe exertional dyspnea, likely fully explained by COPD  R06.09    Very limited activity wise due to dyspnea Due to severity of COPD     Meds ordered this encounter  Medications   Budeson-Glycopyrrol-Formoterol (BREZTRI AEROSPHERE) 160-9-4.8 MCG/ACT AERO    Sig: Inhale 2 puffs into the lungs in the morning and at bedtime.    Dispense:  5.9 g    Refill:  0    Order Specific Question:   Lot Number?    Answer:   [id number]    Order Specific Question:   Expiration Date?    Answer:   01/03/2025    Order Specific Question:   Quantity    Answer:   3   Overall Parker Martinez appears to be as compensated as he is to get.  We will see him in follow-up in 3 months time he is to call sooner should any new problems arise.  Renold Don, MD Advanced Bronchoscopy PCCM [facility]    *This note was dictated using voice recognition  software/Dragon.  Despite best efforts to proofread, errors can occur which can change the meaning. Any transcriptional errors that result from this process are unintentional and may not be fully corrected at the time of dictation.

## 2022-08-25 ENCOUNTER — Ambulatory Visit (HOSPITAL_COMMUNITY)
Admission: RE | Admit: 2022-08-25 | Discharge: 2022-08-25 | Disposition: A | Discharge status: Home / Self Care | Payer: PPO | Source: Ambulatory Visit | Attending: Internal Medicine | Admitting: Internal Medicine

## 2022-08-25 DIAGNOSIS — K224 Dyskinesia of esophagus: Secondary | ICD-10-CM | POA: Diagnosis not present

## 2022-08-25 DIAGNOSIS — R1310 Dysphagia, unspecified: Secondary | ICD-10-CM | POA: Diagnosis not present

## 2022-08-25 DIAGNOSIS — K222 Esophageal obstruction: Secondary | ICD-10-CM | POA: Diagnosis not present

## 2022-08-30 ENCOUNTER — Other Ambulatory Visit: Payer: Self-pay | Admitting: Family Medicine

## 2022-08-30 DIAGNOSIS — I6523 Occlusion and stenosis of bilateral carotid arteries: Secondary | ICD-10-CM

## 2022-09-04 ENCOUNTER — Encounter (HOSPITAL_COMMUNITY): Payer: Self-pay | Admitting: Internal Medicine

## 2022-09-10 ENCOUNTER — Other Ambulatory Visit: Payer: Self-pay | Admitting: Family Medicine

## 2022-09-11 ENCOUNTER — Ambulatory Visit (HOSPITAL_COMMUNITY): Payer: PPO | Admitting: Certified Registered Nurse Anesthetist

## 2022-09-11 ENCOUNTER — Encounter (HOSPITAL_COMMUNITY)
Admission: RE | Disposition: A | Discharge status: Home / Self Care | Payer: Self-pay | Source: Home / Self Care | Attending: Internal Medicine

## 2022-09-11 ENCOUNTER — Ambulatory Visit (HOSPITAL_BASED_OUTPATIENT_CLINIC_OR_DEPARTMENT_OTHER): Payer: PPO | Admitting: Certified Registered Nurse Anesthetist

## 2022-09-11 ENCOUNTER — Other Ambulatory Visit: Payer: Self-pay

## 2022-09-11 ENCOUNTER — Encounter (HOSPITAL_COMMUNITY): Payer: Self-pay | Admitting: Internal Medicine

## 2022-09-11 ENCOUNTER — Ambulatory Visit (HOSPITAL_COMMUNITY)
Admission: RE | Admit: 2022-09-11 | Discharge: 2022-09-11 | Disposition: A | Discharge status: Home / Self Care | Payer: PPO | Attending: Internal Medicine | Admitting: Internal Medicine

## 2022-09-11 DIAGNOSIS — K222 Esophageal obstruction: Secondary | ICD-10-CM | POA: Insufficient documentation

## 2022-09-11 DIAGNOSIS — Z87891 Personal history of nicotine dependence: Secondary | ICD-10-CM

## 2022-09-11 DIAGNOSIS — I6521 Occlusion and stenosis of right carotid artery: Secondary | ICD-10-CM | POA: Diagnosis not present

## 2022-09-11 DIAGNOSIS — R1310 Dysphagia, unspecified: Secondary | ICD-10-CM | POA: Diagnosis not present

## 2022-09-11 DIAGNOSIS — B9689 Other specified bacterial agents as the cause of diseases classified elsewhere: Secondary | ICD-10-CM | POA: Diagnosis not present

## 2022-09-11 DIAGNOSIS — I10 Essential (primary) hypertension: Secondary | ICD-10-CM

## 2022-09-11 DIAGNOSIS — J449 Chronic obstructive pulmonary disease, unspecified: Secondary | ICD-10-CM | POA: Diagnosis not present

## 2022-09-11 DIAGNOSIS — Z9981 Dependence on supplemental oxygen: Secondary | ICD-10-CM | POA: Insufficient documentation

## 2022-09-11 DIAGNOSIS — I739 Peripheral vascular disease, unspecified: Secondary | ICD-10-CM | POA: Insufficient documentation

## 2022-09-11 DIAGNOSIS — Z79899 Other long term (current) drug therapy: Secondary | ICD-10-CM | POA: Diagnosis not present

## 2022-09-11 DIAGNOSIS — K2100 Gastro-esophageal reflux disease with esophagitis, without bleeding: Secondary | ICD-10-CM | POA: Diagnosis not present

## 2022-09-11 DIAGNOSIS — K209 Esophagitis, unspecified without bleeding: Secondary | ICD-10-CM

## 2022-09-11 DIAGNOSIS — Z8546 Personal history of malignant neoplasm of prostate: Secondary | ICD-10-CM | POA: Insufficient documentation

## 2022-09-11 DIAGNOSIS — I7409 Other arterial embolism and thrombosis of abdominal aorta: Secondary | ICD-10-CM | POA: Insufficient documentation

## 2022-09-11 DIAGNOSIS — Z7982 Long term (current) use of aspirin: Secondary | ICD-10-CM | POA: Diagnosis not present

## 2022-09-11 HISTORY — PX: BIOPSY: SHX5522

## 2022-09-11 HISTORY — PX: ESOPHAGOGASTRODUODENOSCOPY (EGD) WITH PROPOFOL: SHX5813

## 2022-09-11 SURGERY — ESOPHAGOGASTRODUODENOSCOPY (EGD) WITH PROPOFOL
Anesthesia: Monitor Anesthesia Care

## 2022-09-11 MED ORDER — PROPOFOL 10 MG/ML IV BOLUS
INTRAVENOUS | Status: DC | PRN
  Administered 2022-09-11 (×2): 20 mg via INTRAVENOUS
  Administered 2022-09-11: 40 mg via INTRAVENOUS
  Administered 2022-09-11: 20 mg via INTRAVENOUS

## 2022-09-11 MED ORDER — PROPOFOL 500 MG/50ML IV EMUL
INTRAVENOUS | Status: DC | PRN
  Administered 2022-09-11: 70 ug/kg/min via INTRAVENOUS

## 2022-09-11 MED ORDER — SODIUM CHLORIDE 0.9 % IV SOLN: INTRAVENOUS | Status: DC

## 2022-09-11 MED ORDER — LIDOCAINE 2% (20 MG/ML) 5 ML SYRINGE
INTRAMUSCULAR | Status: DC | PRN
  Administered 2022-09-11: 40 mg via INTRAVENOUS

## 2022-09-11 MED ORDER — LACTATED RINGERS IV SOLN: INTRAVENOUS | Status: DC

## 2022-09-11 SURGICAL SUPPLY — 15 items

## 2022-09-11 NOTE — Discharge Instructions (Signed)
I dilated the esophagus again - seems like the same problem we have seen in the past. I also took some biopsies.  I will be in touch with biopsy results and make further recommendations then.  I appreciate the opportunity to care for you. Parker Mayer, MD, FACG  YOU HAD AN ENDOSCOPIC PROCEDURE TODAY: Refer to the procedure report and other information in the discharge instructions given to you for any specific questions about what was found during the examination. If this information does not answer your questions, please call Dr. Celesta Aver office at [phone] to clarify.   YOU SHOULD EXPECT: Some feelings of bloating in the abdomen. Passage of more gas than usual. Walking can help get rid of the air that was put into your GI tract during the procedure and reduce the bloating. If you had a lower endoscopy (such as a colonoscopy or flexible sigmoidoscopy) you may notice spotting of blood in your stool or on the toilet paper. Some abdominal soreness may be present for a day or two, also.  DIET:   Clear liquids only until 430 PM then try soft foods today. Resume typical diet tomorrow if ok.   ACTIVITY: Your care partner should take you home directly after the procedure. You should plan to take it easy, moving slowly for the rest of the day. You can resume normal activity the day after the procedure however YOU SHOULD NOT DRIVE, use power tools, machinery or perform tasks that involve climbing or major physical exertion for 24 hours (because of the sedation medicines used during the test).   SYMPTOMS TO REPORT IMMEDIATELY: A gastroenterologist can be reached at any hour. Please call [phone]  for any of the following symptoms:   Following upper endoscopy (EGD, EUS, ERCP, esophageal dilation) Vomiting of blood or coffee ground material  New, significant abdominal pain  New, significant chest pain or pain under the shoulder blades  Painful or persistently difficult swallowing  New  shortness of breath  Black, tarry-looking or red, bloody stools

## 2022-09-11 NOTE — Interval H&P Note (Signed)
History and Physical Interval Note:  09/11/2022 1:19 PM  Parker Martinez  has presented today for surgery, with the diagnosis of Dysphagia.  The various methods of treatment have been discussed with the patient and family. After consideration of risks, benefits and other options for treatment, the patient has consented to  Procedure(s): ESOPHAGOGASTRODUODENOSCOPY (EGD) WITH PROPOFOL (N/A) as a surgical intervention.  The patient's history has been reviewed, patient examined, no change in status, stable for surgery.  I have reviewed the patient's chart and labs.  Questions were answered to the patient's satisfaction.     Ba swallow has shown dysmotility and ? Underdistention vs narrow caliber esophagus. I think I see some distal rings like he has had in past.  Silvano Rusk

## 2022-09-11 NOTE — Telephone Encounter (Signed)
ERx 

## 2022-09-11 NOTE — Anesthesia Preprocedure Evaluation (Signed)
Anesthesia Evaluation  Patient identified by MRN, date of birth, ID band Patient awake    Reviewed: Allergy & Precautions, NPO status , Patient's Chart, lab work & pertinent test results  History of Anesthesia Complications Negative for: history of anesthetic complications  Airway Mallampati: II  TM Distance: >3 FB Neck ROM: Full    Dental  (+) Edentulous Upper, Edentulous Lower, Dental Advisory Given   Pulmonary shortness of breath and with exertion, neg sleep apnea, COPD,  COPD inhaler and oxygen dependent, neg recent URI, former smoker,    + rhonchi        Cardiovascular hypertension, + Peripheral Vascular Disease   Rhythm:Irregular  1. Left ventricular ejection fraction, by estimation, is 55%. The left  ventricle has normal function. The left ventricle has no regional wall  motion abnormalities. Left ventricular diastolic parameters are consistent  with Grade II diastolic dysfunction  (pseudonormalization).  2. Right ventricular systolic function is normal. The right ventricular  size is normal.  3. The mitral valve is normal in structure. No evidence of mitral valve  regurgitation.  4. The aortic valve was not well visualized. Aortic valve regurgitation  is not visualized.   Neuro/Psych neg Seizures  Neuromuscular disease negative psych ROS   GI/Hepatic Neg liver ROS, GERD  Medicated,  Endo/Other  negative endocrine ROS  Renal/GU negative Renal ROSLab Results      Component                Value               Date                      CREATININE               0.85                08/21/2022                Musculoskeletal   Abdominal   Peds  Hematology  (+) Blood dyscrasia, anemia , Lab Results      Component                Value               Date                      WBC                      11.6 (H)            08/21/2022                HGB                      12.0 (L)            08/21/2022                 HCT                      35.4 (L)            08/21/2022                MCV                      96.8  08/21/2022                PLT                      240.0               08/21/2022              Anesthesia Other Findings   Reproductive/Obstetrics                             Anesthesia Physical Anesthesia Plan  ASA: 4  Anesthesia Plan: MAC   Post-op Pain Management: Minimal or no pain anticipated   Induction: Intravenous  PONV Risk Score and Plan: 1 and Propofol infusion and Treatment may vary due to age or medical condition  Airway Management Planned: Nasal Cannula and Natural Airway  Additional Equipment: None  Intra-op Plan:   Post-operative Plan:   Informed Consent: I have reviewed the patients History and Physical, chart, labs and discussed the procedure including the risks, benefits and alternatives for the proposed anesthesia with the patient or authorized representative who has indicated his/her understanding and acceptance.     Dental advisory given  Plan Discussed with: CRNA  Anesthesia Plan Comments:         Anesthesia Quick Evaluation

## 2022-09-11 NOTE — Op Note (Signed)
[facility] Patient Name: Parker Martinez Procedure Date: 09/11/2022 MRN: [id number] Attending MD: Gatha Mayer , MD Date of Birth: 1941/03/17 CSN: [id number] Age: 81 Admit Type: Outpatient Procedure:                Upper GI endoscopy Indications:              Dysphagia, Stricture of the esophagus, For therapy                            of esophageal stricture Providers:                Gatha Mayer, MD, Dulcy Fanny, Cletis [place],                            Technician, Cleda Daub, CRNA Referring MD:             Gatha Mayer, MD Medicines:                Monitored Anesthesia Care Complications:            No immediate complications. Estimated Blood Loss:     Estimated blood loss was minimal. Procedure:                Pre-Anesthesia Assessment:                           - Prior to the procedure, a History and Physical                            was performed, and patient medications and                            allergies were reviewed. The patient's tolerance of                            previous anesthesia was also reviewed. The risks                            and benefits of the procedure and the sedation                            options and risks were discussed with the patient.                            All questions were answered, and informed consent                            was obtained. Prior Anticoagulants: The patient has                            taken no previous anticoagulant or antiplatelet                            agents. ASA Grade Assessment: IV - A patient with  severe systemic disease that is a constant threat                            to life. After reviewing the risks and benefits,                            the patient was deemed in satisfactory condition to                            undergo the procedure.                           After obtaining informed consent, the endoscope was                             passed under direct vision. Throughout the                            procedure, the patient's blood pressure, pulse, and                            oxygen saturations were monitored continuously. The                            [id number]-[id number] ([id number] Olympus endoscope was introduced                            through the mouth, and advanced to the second part                            of duodenum. The upper GI endoscopy was                            accomplished without difficulty. The patient                            tolerated the procedure well. Scope In: Scope Out: Findings:      A few benign-appearing, intrinsic moderate (circumferential scarring or       stenosis; an endoscope may pass) stenoses were found in the lower third       of the esophagus. The stenoses were traversed. A TTS dilator was passed       through the scope. Dilation with a 15-16.5-18 mm balloon dilator was       performed to 18 mm. The dilation site was examined and showed moderate       mucosal disruption. Estimated blood loss was minimal.      Esophagitis with no bleeding was found in the lower third of the       esophagus. Biopsies were taken with a cold forceps for histology.       Verification of patient identification for the specimen was done.       Estimated blood loss was minimal.      The exam was otherwise without abnormality.      The cardia and gastric fundus were normal on retroflexion. Impression:               -  Benign-appearing esophageal stenoses. Dilated.                            Multiple distal esophageal rings as in past -                            dilated to 18 mm via balloon.                           - Esophagitis with no bleeding. Biopsied. Exudate                            in distal exophagus - ? adherent material vs                            esophagitis.                           - The examination was otherwise normal. Moderate Sedation:      Not Applicable - Patient  had care per Anesthesia. Recommendation:           - Patient has a contact number available for                            emergencies. The signs and symptoms of potential                            delayed complications were discussed with the                            patient. Return to normal activities tomorrow.                            Written discharge instructions were provided to the                            patient.                           - Clear liquids x 1 hour then soft foods rest of                            day. Start prior diet tomorrow.                           - Continue present medications.                           - Await pathology results.                           - Further plans peding path review. Hopefully this                            dilation will be effective,. There is a component  of dysmotility also.                           As he is on chronic oxygen therapy he must have                            procedures in the hospital setting. Procedure Code(s):        --- Professional ---                           [phone], Esophagogastroduodenoscopy, flexible,                            transoral; with transendoscopic balloon dilation of                            esophagus (less than 30 mm diameter)                           [id number], 59, Esophagogastroduodenoscopy, flexible,                            transoral; with biopsy, single or multiple Diagnosis Code(s):        --- Professional ---                           K22.2, Esophageal obstruction                           K20.90, Esophagitis, unspecified without bleeding                           R13.10, Dysphagia, unspecified CPT copyright 2019 American Medical Association. All rights reserved. The codes documented in this report are preliminary and upon coder review may  be revised to meet current compliance requirements. Gatha Mayer, MD 09/11/2022 3:42:44 PM This report has  been signed electronically. Number of Addenda: 0

## 2022-09-11 NOTE — Telephone Encounter (Signed)
Name of Medication: Tramadol Name of Pharmacy: [facility] Last Fill or Written Date and Quantity: 05/10/22, #30 Last Office Visit and Type: 05-10-22 Next Office Visit and Type: No Future OV Last Controlled Substance Agreement Date: 09/20/16 Last UDS: 09/20/16

## 2022-09-11 NOTE — Transfer of Care (Signed)
Immediate Anesthesia Transfer of Care Note  Patient: Parker Martinez  Procedure(s) Performed: ESOPHAGOGASTRODUODENOSCOPY (EGD) WITH PROPOFOL BIOPSY Balloon dilation wire-guided  Patient Location: PACU  Anesthesia Type:MAC  Level of Consciousness: awake, alert , oriented and patient cooperative  Airway & Oxygen Therapy: Patient Spontanous Breathing and Patient connected to face mask oxygen  Post-op Assessment: Report given to RN and Post -op Vital signs reviewed and stable  Post vital signs: Reviewed and stable  Last Vitals:  Vitals Value Taken Time  BP    Temp    Pulse    Resp    SpO2      Last Pain:  Vitals:   09/11/22 1020  TempSrc: Temporal  PainSc: 4       Patients Stated Pain Goal: 4 (28/41/32 4401)  Complications: No notable events documented.

## 2022-09-12 NOTE — Anesthesia Postprocedure Evaluation (Signed)
Anesthesia Post Note  Patient: [name]  Procedure(s) Performed: ESOPHAGOGASTRODUODENOSCOPY (EGD) WITH PROPOFOL BIOPSY Balloon dilation wire-guided     Patient location during evaluation: Endoscopy Anesthesia Type: MAC Level of consciousness: awake and alert Pain management: pain level controlled Vital Signs Assessment: post-procedure vital signs reviewed and stable Respiratory status: spontaneous breathing, nonlabored ventilation, respiratory function stable and patient connected to nasal cannula oxygen Cardiovascular status: stable and blood pressure returned to baseline Postop Assessment: no apparent nausea or vomiting Anesthetic complications: no   No notable events documented.  Last Vitals:  Vitals:   09/11/22 1540 09/11/22 1550  BP: 112/62 133/62  Pulse: (!) 109 (!) 104  Resp: 15 12  Temp:    SpO2: 99% 98%    Last Pain:  Vitals:   09/11/22 1550  TempSrc:   PainSc: 0-No pain                 Chanise Habeck

## 2022-09-13 ENCOUNTER — Other Ambulatory Visit: Payer: Self-pay | Admitting: Family Medicine

## 2022-09-14 ENCOUNTER — Encounter (HOSPITAL_COMMUNITY): Payer: Self-pay | Admitting: Internal Medicine

## 2022-09-14 LAB — SURGICAL PATHOLOGY

## 2022-09-16 ENCOUNTER — Encounter: Payer: Self-pay | Admitting: Family Medicine

## 2022-09-16 ENCOUNTER — Other Ambulatory Visit: Payer: Self-pay | Admitting: Pulmonary Disease

## 2022-09-18 DIAGNOSIS — J449 Chronic obstructive pulmonary disease, unspecified: Secondary | ICD-10-CM | POA: Diagnosis not present

## 2022-09-22 ENCOUNTER — Telehealth: Payer: Self-pay | Admitting: Family Medicine

## 2022-09-22 NOTE — Telephone Encounter (Signed)
Patient would like to know if Dr Darnell Level recommend his patients receiving the RSV vaccine?

## 2022-09-22 NOTE — Telephone Encounter (Signed)
I do recommend he receive RSV vaccine given his respiratory history.

## 2022-09-25 NOTE — Telephone Encounter (Signed)
Spoke with pt relaying Dr. G's message. Pt verbalizes understanding.  

## 2022-10-11 ENCOUNTER — Ambulatory Visit
Admission: RE | Admit: 2022-10-11 | Discharge: 2022-10-11 | Disposition: A | Discharge status: Home / Self Care | Payer: PPO | Source: Ambulatory Visit | Attending: Radiation Oncology | Admitting: Radiation Oncology

## 2022-10-11 DIAGNOSIS — C3412 Malignant neoplasm of upper lobe, left bronchus or lung: Secondary | ICD-10-CM | POA: Diagnosis not present

## 2022-10-11 DIAGNOSIS — Z923 Personal history of irradiation: Secondary | ICD-10-CM | POA: Diagnosis not present

## 2022-10-11 DIAGNOSIS — R918 Other nonspecific abnormal finding of lung field: Secondary | ICD-10-CM | POA: Diagnosis not present

## 2022-10-11 DIAGNOSIS — R911 Solitary pulmonary nodule: Secondary | ICD-10-CM | POA: Insufficient documentation

## 2022-10-11 DIAGNOSIS — J449 Chronic obstructive pulmonary disease, unspecified: Secondary | ICD-10-CM | POA: Diagnosis not present

## 2022-10-11 DIAGNOSIS — J9610 Chronic respiratory failure, unspecified whether with hypoxia or hypercapnia: Secondary | ICD-10-CM | POA: Diagnosis not present

## 2022-10-11 DIAGNOSIS — Z7951 Long term (current) use of inhaled steroids: Secondary | ICD-10-CM | POA: Diagnosis not present

## 2022-10-11 DIAGNOSIS — J439 Emphysema, unspecified: Secondary | ICD-10-CM | POA: Diagnosis not present

## 2022-10-11 DIAGNOSIS — C3490 Malignant neoplasm of unspecified part of unspecified bronchus or lung: Secondary | ICD-10-CM | POA: Diagnosis not present

## 2022-10-11 DIAGNOSIS — Z515 Encounter for palliative care: Secondary | ICD-10-CM | POA: Diagnosis not present

## 2022-10-11 DIAGNOSIS — I10 Essential (primary) hypertension: Secondary | ICD-10-CM | POA: Diagnosis not present

## 2022-10-11 DIAGNOSIS — Z9981 Dependence on supplemental oxygen: Secondary | ICD-10-CM | POA: Diagnosis not present

## 2022-10-11 LAB — POCT I-STAT CREATININE: Creatinine, Ser: 0.8 mg/dL (ref 0.61–1.24)

## 2022-10-11 MED ORDER — IOHEXOL 300 MG/ML  SOLN
75.0000 mL | Freq: Once | INTRAMUSCULAR | Status: AC | PRN
  Administered 2022-10-11: 75 mL via INTRAVENOUS

## 2022-10-18 ENCOUNTER — Encounter: Payer: Self-pay | Admitting: Radiation Oncology

## 2022-10-18 ENCOUNTER — Ambulatory Visit
Admission: RE | Admit: 2022-10-18 | Discharge: 2022-10-18 | Disposition: A | Discharge status: Home / Self Care | Payer: PPO | Source: Ambulatory Visit | Attending: Radiation Oncology | Admitting: Radiation Oncology

## 2022-10-18 VITALS — BP 103/60 | HR 88 | Resp 22 | Ht 69.0 in | Wt 151.9 lb

## 2022-10-18 DIAGNOSIS — Z87891 Personal history of nicotine dependence: Secondary | ICD-10-CM | POA: Insufficient documentation

## 2022-10-18 DIAGNOSIS — R918 Other nonspecific abnormal finding of lung field: Secondary | ICD-10-CM | POA: Insufficient documentation

## 2022-10-18 DIAGNOSIS — R911 Solitary pulmonary nodule: Secondary | ICD-10-CM

## 2022-10-18 DIAGNOSIS — Z923 Personal history of irradiation: Secondary | ICD-10-CM | POA: Diagnosis not present

## 2022-10-18 DIAGNOSIS — C3490 Malignant neoplasm of unspecified part of unspecified bronchus or lung: Secondary | ICD-10-CM

## 2022-10-18 MED ORDER — AZITHROMYCIN 250 MG PO TABS: ORAL_TABLET | ORAL | 2 refills | Status: DC

## 2022-10-18 MED ORDER — AZITHROMYCIN 250 MG PO TABS: ORAL_TABLET | ORAL | 1 refills | Status: DC

## 2022-10-18 NOTE — Progress Notes (Signed)
Radiation Oncology Follow up Note  Name: Parker Martinez   Date:   10/18/2022 MRN:  [id number] DOB: 05-31-1941    This 81 y.o. male presents to the clinic today for 76-month follow-up status post SBRT for presumed stage I non-small cell lung cancer left upper lobe and patient with end-stage pulmonary disease.  REFERRING PROVIDER: Ria Bush, MD  HPI: Patient is a an 81 year old male now out for months having completed SBRT for presumed stage I non-small cell lung cancer left upper lobe.  Seen today in routine follow-up he is doing well has had no change in his pulmonary status he has a mild cough nonproductive no hemoptysis or dysphagia.  He had a recent CT scan.  Showing left upper lobe pulmonary nodules decreased in size with surrounding little nodular opacities consistent with postradiation change.  He does have a new large irregular consolidation in the posterior right upper lobe with cavitation likely sequela of necrotic pneumonia or aspiration.  Short-term follow-up CT scan was recommended.  COMPLICATIONS OF TREATMENT: none  FOLLOW UP COMPLIANCE: keeps appointments   PHYSICAL EXAM:  BP 103/60   Pulse 88   Resp (!) 22   Ht 5\' 9"  (1.753 m)   Wt 151 lb 14.4 oz (68.9 kg)   BMI 22.43 kg/m  Oxygen pendant male in NAD.  Well-developed well-nourished patient in NAD. HEENT reveals PERLA, EOMI, discs not visualized.  Oral cavity is clear. No oral mucosal lesions are identified. Neck is clear without evidence of cervical or supraclavicular adenopathy. Lungs are clear to A&P. Cardiac examination is essentially unremarkable with regular rate and rhythm without murmur rub or thrill. Abdomen is benign with no organomegaly or masses noted. Motor sensory and DTR levels are equal and symmetric in the upper and lower extremities. Cranial nerves II through XII are grossly intact. Proprioception is intact. No peripheral adenopathy or edema is identified. No motor or sensory levels are noted. Crude  visual fields are within normal range.  RADIOLOGY RESULTS: CT scans reviewed compatible with above-stated findings  PLAN: Present time patient is a follow-up appointment with Dr. [name] mid December.  I am starting the patient on a Z-Pak x2.  All leave it up to Dr. [name] to order follow-up imaging studies.  I have asked to see him back in 6 months for follow-up with a follow-up CT scan.  Patient comprehends my recommendations well.  I would like to take this opportunity to thank you for allowing me to participate in the care of your patient.Noreene Filbert, MD

## 2022-10-19 ENCOUNTER — Other Ambulatory Visit: Payer: Self-pay | Admitting: *Deleted

## 2022-10-19 ENCOUNTER — Telehealth: Payer: Self-pay | Admitting: *Deleted

## 2022-10-19 ENCOUNTER — Ambulatory Visit: Payer: PPO | Admitting: Radiation Oncology

## 2022-10-19 DIAGNOSIS — J449 Chronic obstructive pulmonary disease, unspecified: Secondary | ICD-10-CM | POA: Diagnosis not present

## 2022-10-19 DIAGNOSIS — H35371 Puckering of macula, right eye: Secondary | ICD-10-CM | POA: Diagnosis not present

## 2022-10-19 MED ORDER — AZITHROMYCIN 250 MG PO TABS: ORAL_TABLET | ORAL | 1 refills | Status: DC

## 2022-10-19 NOTE — Telephone Encounter (Signed)
Wife called again today statint that she still has not heard back from you regarding tha antibiotics that were to have been called in for patient and that the pharmacy still does NOT have prescription. She is requesting at return call [phone]

## 2022-10-20 ENCOUNTER — Other Ambulatory Visit: Payer: Self-pay | Admitting: Family Medicine

## 2022-10-20 DIAGNOSIS — E785 Hyperlipidemia, unspecified: Secondary | ICD-10-CM

## 2022-10-23 ENCOUNTER — Other Ambulatory Visit: Payer: Self-pay | Admitting: Pulmonary Disease

## 2022-10-23 ENCOUNTER — Other Ambulatory Visit: Payer: Self-pay | Admitting: Family Medicine

## 2022-10-23 NOTE — Telephone Encounter (Signed)
Refill request gabapentin Last  refill 09/13/22 #60 Last office visit 05/10/22

## 2022-11-02 ENCOUNTER — Other Ambulatory Visit: Payer: Self-pay | Admitting: Family Medicine

## 2022-11-02 DIAGNOSIS — E559 Vitamin D deficiency, unspecified: Secondary | ICD-10-CM

## 2022-11-02 DIAGNOSIS — E785 Hyperlipidemia, unspecified: Secondary | ICD-10-CM

## 2022-11-03 ENCOUNTER — Other Ambulatory Visit: Payer: PPO

## 2022-11-03 ENCOUNTER — Ambulatory Visit (INDEPENDENT_AMBULATORY_CARE_PROVIDER_SITE_OTHER): Payer: PPO

## 2022-11-03 VITALS — Ht 69.0 in | Wt 151.0 lb

## 2022-11-03 DIAGNOSIS — Z0000 Encounter for general adult medical examination without abnormal findings: Secondary | ICD-10-CM

## 2022-11-03 NOTE — Progress Notes (Signed)
Subjective:   Parker Martinez is a 81 y.o. male who presents for Medicare Annual/Subsequent preventive examination.  Review of Systems    No ROS.  Medicare Wellness Virtual Visit.  Visual/audio telehealth visit, UTA vital signs.   See social history for additional risk factors.   Cardiac Risk Factors include: advanced age (>45men, >24 women);male gender     Objective:    Today's Vitals   11/03/22 1438  Weight: 151 lb (68.5 kg)  Height: 5\' 9"  (1.753 m)   Body mass index is 22.3 kg/m.     11/03/2022    3:14 PM 09/11/2022   10:22 AM 04/11/2022   10:25 AM 11/02/2021    9:49 AM 10/26/2020    6:45 PM 10/20/2020    9:45 AM 02/16/2020   10:17 AM  Advanced Directives  Does Patient Have a Medical Advance Directive? No Yes Yes Yes Yes Yes Yes  Type of Social research officer, government;Living will [place];Living will [place];Living will [place];Living will [place];Living will [place];Living will  Does patient want to make changes to medical advance directive?   No - Patient declined Yes (MAU/Ambulatory/Procedural Areas - Information given) No - Patient declined  No - Patient declined  Copy of [place] in Chart?  No - copy requested No - copy requested  No - copy requested No - copy requested Yes - validated most recent copy scanned in chart (See row information)    Current Medications (verified) Outpatient Encounter Medications as of 11/03/2022  Medication Sig   acetaminophen (TYLENOL) 325 MG tablet Take 1 tablet (325 mg total) by mouth every 6 (six) hours as needed for mild pain, fever or headache (or Fever >/= 101).   albuterol (PROVENTIL) (2.5 MG/3ML) 0.083% nebulizer solution INHALE 1 VIAL VIA NEBULIZER EVERY 4 HOURS AS NEEDED FOR WHEEZING OR SHORTNESS OF BREATH   albuterol (VENTOLIN HFA) 108 (90 Base) MCG/ACT inhaler USE 2 PUFFS BY MOUTH EVERY 6  HOURS AS NEEDED FOR WHEEZING   alfuzosin (UROXATRAL) 10 MG 24 hr tablet Take 10 mg by mouth daily with breakfast.   aspirin 81 MG tablet Take 81 mg by mouth every morning.   atorvastatin (LIPITOR) 40 MG tablet TAKE 1 TABLET BY MOUTH EVERY DAY   azithromycin (ZITHROMAX Z-PAK) 250 MG tablet Take as directed   Budeson-Glycopyrrol-Formoterol (BREZTRI AEROSPHERE) 160-9-4.8 MCG/ACT AERO Inhale 2 puffs into the lungs 2 (two) times daily.   EPINEPHrine (EPIPEN 2-PAK) 0.3 mg/0.3 mL IJ SOAJ injection Inject 0.3 mg into the muscle as needed for anaphylaxis.   fexofenadine (ALLEGRA) 180 MG tablet Take 180 mg by mouth in the morning.   gabapentin (NEURONTIN) 100 MG capsule TAKE 1-2 CAPSULES (100-200 MG TOTAL) BY MOUTH AT BEDTIME.   ipratropium (ATROVENT) 0.06 % nasal spray SPRAY 2 SPRAYS INTO EACH NOSTRIL 4 TIMES A DAY AS NEEDED FOR RHINITIS   montelukast (SINGULAIR) 10 MG tablet TAKE 1 TABLET BY MOUTH EVERYDAY AT BEDTIME   Multiple Vitamin (MULTIVITAMIN) tablet Take 1 tablet by mouth in the morning.   pantoprazole (PROTONIX) 40 MG tablet Take 1 tablet (40 mg total) by mouth 2 (two) times daily before a meal.   Polyethyl Glycol-Propyl Glycol (LUBRICANT EYE DROPS) 0.4-0.3 % SOLN Place 1-2 drops into both eyes 3 (three) times daily as needed (dry/irritated eyes.).   predniSONE (DELTASONE) 10 MG tablet TAKE 1 TABLET (10 MG TOTAL) BY MOUTH DAILY WITH BREAKFAST.  Roflumilast 250 MCG TABS TAKE 1 TABLET BY MOUTH EVERY DAY   traMADol (ULTRAM) 50 MG tablet TAKE 1 TABLET BY MOUTH 2 TIMES DAILY AS NEEDED.   vitamin B-12 (CYANOCOBALAMIN) 500 MCG tablet Take 500 mcg by mouth every morning.    No facility-administered encounter medications on file as of 11/03/2022.    Allergies (verified) Flonase [fluticasone propionate]   History: Past Medical History:  Diagnosis Date   Abnormal drug screen 10/2013, 03/2015   low Cr, +EtOH, neg hydrocodone (10/2013, again 03/2015)   Allergic rhinitis    Arthritis    lumbar and  cervical spine   Carotid stenosis 09/11/2014   40-59% bilat ICA stenosis, rpt 1 yr (09/2014)    COPD (chronic obstructive pulmonary disease) ([place])    severe, participated in [facility] study   DDD (degenerative disc disease)    lumbar (HNP R L3/4) s/p ESI, cervical   Diverticulitis 2015   by CT   Diverticulosis 2008   severe by colonoscopy and CT   Duodenitis    Esophageal stricture 2012   s/p dilation   GERD (gastroesophageal reflux disease)    Hemangioma    congenital left arm   History of kidney stones remote   HTN (hypertension)    borderline readings at home   Idiopathic anaphylaxis    h/o recurrent anaphylaxis, unknown trigger, s/p eval at [facility] [phone]), last episode 03/2011   PAD (peripheral artery disease) ([place])    Personal history of colonic adenomas 09/02/2013   Prostate cancer ([place]) 2011   prostate seed implant (Dr. [name])   Smoker    Status post dilatation of esophageal stricture    Subclavian steal syndrome 10/12/2016   Left subclavian steal, with 18 mmHg brachial artery pressure gradient (10/2016). If symptomatic, rec PV consult   Past Surgical History:  Procedure Laterality Date   BALLOON DILATION N/A 10/02/2014   Procedure: BALLOON DILATION;  Surgeon: Gatha Mayer, MD;  Location: [facility];  Service: Endoscopy;  Laterality: N/A;   BIOPSY  09/11/2022   Procedure: BIOPSY;  Surgeon: Gatha Mayer, MD;  Location: [facility];  Service: Gastroenterology;;   CATARACT EXTRACTION W/PHACO Right 01/26/2020   Procedure: CATARACT EXTRACTION PHACO AND INTRAOCULAR LENS PLACEMENT ([place]) RIGHT 5.15  00:42.9;  Surgeon: [name], MD;  Location: [facility];  Service: Ophthalmology;  Laterality: Right;   CATARACT EXTRACTION W/PHACO Left 02/16/2020   Procedure: CATARACT EXTRACTION PHACO AND INTRAOCULAR LENS PLACEMENT ([facility]) LEFT 3.87  00:45.4;  Surgeon: [name], MD;  Location: [facility];  Service: Ophthalmology;  Laterality:  Left;   COLONOSCOPY  08/2013   4 tubular adenomas, severe divertic, rpt 3 yrs [name])   COLONOSCOPY  10/2016   severe diverticulosis, no f/u recommended [name])   ESOPHAGOGASTRODUODENOSCOPY  10/2016   2 rings dilated [name])   ESOPHAGOGASTRODUODENOSCOPY  01/2019   dilated esophageal webs [name])   ESOPHAGOGASTRODUODENOSCOPY (EGD) WITH PROPOFOL N/A 10/02/2014   WNL, mild esophagitis; Gatha Mayer, MD   ESOPHAGOGASTRODUODENOSCOPY (EGD) WITH PROPOFOL N/A 09/11/2022   dilated esophageal stenosis with multiple distal estophageal rings, biopsy: acute esophagitis, GERD [name], Ofilia Neas, MD)   INSERTION PROSTATE RADIATION SEED  01/2015   [name]   LUMBAR MICRODISCECTOMY Left 03/2019   L L4/L5 [name])   [place]   due to chronic dislocation   SPIROMETRY  10/2010   severe obstruction, FEV1 41%, ratio 0.46   TONSILLECTOMY  1944 ?   UPPER GASTROINTESTINAL ENDOSCOPY  06/08/2009  dilated esophageal stricture, duodenitis   virtual colonoscopy  2007   [name], rpt rec 5 yrs   Family History  Problem Relation Age of Onset   Lung cancer Father    Hyperlipidemia Father    Arthritis Mother    Breast cancer Daughter    Coronary artery disease Neg Hx    Stroke Neg Hx    Diabetes Neg Hx    Social History   Socioeconomic History   Marital status: Married    Spouse name: Not on file   Number of children: 1   Years of education: Not on file   Highest education level: Not on file  Occupational History   Occupation: retired    Fish farm manager: RETIRED  Tobacco Use   Smoking status: Former    Packs/day: 1.00    Years: 60.00    Total pack years: 60.00    Types: Cigarettes    Quit date: 12/13/2021    Years since quitting: 0.8   Smokeless tobacco: Never   Tobacco comments:            5-6 cigs pd- 12/13/21     Quit in January   Vaping Use   Vaping Use: Never used  Substance and Sexual Activity   Alcohol use: Yes    Alcohol/week: 14.0 standard drinks of alcohol     Types: 14 Cans of beer per week    Comment: socially   Drug use: No   Sexual activity: Not on file  Other Topics Concern   Not on file  Social History Narrative   Caffeine: 2-4 cups coffee   Lives with wife, 1 bulldog, 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 Wife is Ladean Raya. "no extraordinary efforts" does not want feeding tube.      Tested negative for hydrocodone and positive for alcohol on UDS 10/2013.  Recommend Q6 mo testing and will need to discuss EtOH use at next visit.   Social Determinants of Health   Financial Resource Strain: Low Risk  (11/03/2022)   Overall Financial Resource Strain (CARDIA)    Difficulty of Paying Living Expenses: Not hard at all  Food Insecurity: No Food Insecurity (11/03/2022)   Hunger Vital Sign    Worried About Running Out of Food in the Last Year: Never true    Ran Out of Food in the Last Year: Never true  Transportation Needs: No Transportation Needs (11/03/2022)   PRAPARE - Hydrologist (Medical): No    Lack of Transportation (Non-Medical): No  Physical Activity: Sufficiently Active (11/02/2021)   Exercise Vital Sign    Days of Exercise per Week: 4 days    Minutes of Exercise per Session: 40 min  Stress: No Stress Concern Present (11/03/2022)   [place]    Feeling of Stress : Not at all  Social Connections: Moderately Isolated (11/03/2022)   Social Connection and Isolation Panel [NHANES]    Frequency of Communication with Friends and Family: Three times a week    Frequency of Social Gatherings with Friends and Family: Once a week    Attends Religious Services: Never    [name] or Organizations: No    Attends Theatre manager Meetings: Not on file  Marital Status: Married    Tobacco Counseling Counseling given: Not Answered Tobacco comments:   5-6 cigs pd- 12/13/21  Quit in January    Clinical Intake:  Pre-visit preparation completed: Yes        Diabetes: No  How often do you need to have someone help you when you read instructions, pamphlets, or other written materials from your doctor or pharmacy?: 1 - Never    Interpreter Needed?: No      Activities of Daily Living    11/03/2022    2:37 PM  In your present state of health, do you have any difficulty performing the following activities:  Hearing? 0  Vision? 0  Difficulty concentrating or making decisions? 0  Walking or climbing stairs? 1  Comment SOBOE. Paces self. Oxygen at 3L.  Dressing or bathing? 0  Doing errands, shopping? 0  Preparing Food and eating ? N  Using the Toilet? N  In the past six months, have you accidently leaked urine? N  Comment Followed by Urology  Do you have problems with loss of bowel control? N  Managing your Medications? N  Managing your Finances? N  Housekeeping or managing your Housekeeping? N    Patient Care Team: Ria Bush, MD as PCP - General (Family Medicine) [name], MD as Consulting Physician (Ophthalmology) Angelia Mould, MD as Consulting Physician (Vascular Surgery) Gatha Mayer, MD as Consulting Physician (Gastroenterology) Myrlene Broker, MD as Attending Physician (Urology) Debbora Dus, [facility] as Pharmacist (Pharmacist) Tyler Pita, MD as Consulting Physician (Pulmonary Disease)  Indicate any recent Medical Services you may have received from other than [facility] providers in the past year (date may be approximate).     Assessment:   This is a routine wellness examination for Way[place].  I connected with  [name] on 11/03/22 by a audio enabled telemedicine application and verified that I am speaking with the correct person  using two identifiers.  Patient Location: Home  Provider Location: Office/Clinic  I discussed the limitations of evaluation and management by telemedicine. The patient expressed understanding and agreed to proceed.   Hearing/Vision screen Hearing Screening - Comments:: Bilateral hearing loss.  Vision Screening - Comments:: Last exam 09/2022, Dr. Edison Pace, wears reading glasses  Dietary issues and exercise activities discussed: Current Exercise Habits: Home exercise routine, Type of exercise: stretching (isometrics), Intensity: Mild   Goals Addressed             This Visit's Progress    Healthy lifestyle       Healthy diet Stay active Stay hydrated       Depression Screen    11/03/2022    3:12 PM 11/02/2021    9:51 AM 10/20/2020    9:46 AM 10/20/2019   11:22 AM 10/15/2018   10:50 AM 08/01/2018    5:21 PM 10/09/2017   12:32 PM  PHQ 2/9 Scores  PHQ - 2 Score 0 0 0 0 0 0 0  PHQ- 9 Score   0 0 0  0    Fall Risk    11/03/2022    2:36 PM 11/02/2021    9:50 AM 10/20/2020    9:46 AM 10/20/2019   11:21 AM 10/15/2018   10:50 AM  [place] in the past year? 0 0 0 0 0  Number falls in past yr: 0 0 0 0   Injury with Fall? 0 0 0 0   Risk for fall due to : No  Fall Risks No Fall Risks Medication side effect Medication side effect   Follow up Falls evaluation completed Falls prevention discussed Falls evaluation completed;Falls prevention discussed Falls evaluation completed;Falls prevention discussed     FALL RISK PREVENTION PERTAINING TO THE HOME: Home free of loose throw rugs in walkways, pet beds, electrical cords, etc? Yes  Adequate lighting in your home to reduce risk of falls? Yes   ASSISTIVE DEVICES UTILIZED TO PREVENT FALLS: Life alert? No  Use of a cane, walker or w/c? Yes   TIMED UP AND GO: Was the test performed? No .   Cognitive Function:    10/20/2020    9:48 AM 10/20/2019   11:24 AM 10/15/2018   10:50 AM 10/09/2017   12:32 PM  MMSE - Mini  Mental State Exam  Orientation to time 5 4 5 5   Orientation to Place 5 5 5 5   Registration 3 3 3 3   Attention/ Calculation 5 5 0 0  Recall 3 3 3 3   Language- name 2 objects   0 0  Language- repeat 1 1 1 1   Language- follow 3 step command   3 3  Language- read & follow direction   0 0  Write a sentence   0 0  Copy design   0 0  Total score   20 20        11/03/2022    3:15 PM  6CIT Screen  What Year? 0 points  What month? 0 points  Months in reverse 0 points    Immunizations Immunization History  Administered Date(s) Administered   Fluad Quad(high Dose 65+) 09/09/2019, 09/07/2020   Influenza, High Dose Seasonal PF 09/07/2021   Influenza,inj,Quad PF,6+ Mos 09/11/2014, 09/24/2015, 09/20/2016, 09/14/2017, 08/29/2018   PFIZER(Purple Top)SARS-COV-2 Vaccination 12/20/2019, 01/10/2020, 09/05/2020   Pfizer Covid-19 Vaccine Bivalent Booster 70yrs & up 09/07/2021   Pneumococcal Conjugate-13 09/11/2014   Pneumococcal Polysaccharide-23 12/04/2009    TDAP status: Due, Education has been provided regarding the importance of this vaccine. Advised may receive this vaccine at local pharmacy or Health Dept. Aware to provide a copy of the vaccination record if obtained from local pharmacy or Health Dept. Verbalized acceptance and understanding.  Flu Vaccine status: Due, Education has been provided regarding the importance of this vaccine. Advised may receive this vaccine at local pharmacy or Health Dept. Aware to provide a copy of the vaccination record if obtained from local pharmacy or Health Dept. Verbalized acceptance and understanding.  Covid-19 vaccine status: Completed vaccines x4.  Shingrix Completed?: No.    Education has been provided regarding the importance of this vaccine. Patient has been advised to call insurance company to determine out of pocket expense if they have not yet received this vaccine. Advised may also receive vaccine at local pharmacy or Health Dept. Verbalized  acceptance and understanding.  Screening Tests Health Maintenance  Topic Date Due   DTaP/Tdap/Td (1 - Tdap) Never done   COVID-19 Vaccine (5 - 2023-24 season) 11/19/2022 (Originally 08/04/2022)   COLONOSCOPY (Pts 45-25yrs Insurance coverage will need to be confirmed)  12/04/2022 (Originally 10/31/2019)   Zoster Vaccines- Shingrix (1 of 2) 02/02/2023 (Originally 05/31/1960)   INFLUENZA VACCINE  03/04/2023 (Originally 07/04/2022)   Pneumonia Vaccine 45+ Years old  Completed   HPV VACCINES  Aged Out   Health Maintenance Health Maintenance Due  Topic Date Due   DTaP/Tdap/Td (1 - Tdap) Never done   Colonoscopy- declined.   CT Chest w contrast: completed 10/13/22  Hepatitis C Screening: does not qualify.  Vision Screening: Recommended annual ophthalmology exams for early detection of glaucoma and other disorders of the eye.  Dental Screening: Recommended annual dental exams for proper oral hygiene.  Community Resource Referral / Chronic Care Management: CRR required this visit?  No   CCM required this visit?  No      Plan:   Patient had difficulty hearing first half of visit. Phone visit continued with secondary phone call on a different number.   I have personally reviewed and noted the following in the patient's chart:   Medical and social history Use of alcohol, tobacco or illicit drugs  Current medications and supplements including opioid prescriptions. Patient is currently taking opioid prescriptions. Information provided to patient regarding non-opioid alternatives. Patient advised to discuss non-opioid treatment plan with their provider. Taking Tramadol. Followed by pcp. Functional ability and status Nutritional status Physical activity Advanced directives List of other physicians Hospitalizations, surgeries, and ER visits in previous 12 months Vitals Screenings to include cognitive, depression, and falls Referrals and appointments  In addition, I have reviewed and  discussed with patient certain preventive protocols, quality metrics, and best practice recommendations. A written personalized care plan for preventive services as well as general preventive health recommendations were provided to patient.     Leta Jungling, LPN   [id number]

## 2022-11-03 NOTE — Patient Instructions (Addendum)
Parker Martinez , Thank you for taking time to come for your Medicare Wellness Visit. I appreciate your ongoing commitment to your health goals. Please review the following plan we discussed and let me know if I can assist you in the future.   These are the goals we discussed:  Goals Addressed             This Visit's Progress    Healthy lifestyle       Healthy diet Stay active Stay hydrated      This is a list of the screening recommended for you and due dates:  Health Maintenance  Topic Date Due   DTaP/Tdap/Td vaccine (1 - Tdap) Never done   COVID-19 Vaccine (5 - 2023-24 season) 11/19/2022*   Colon Cancer Screening  12/04/2022*   Zoster (Shingles) Vaccine (1 of 2) 02/02/2023*   Flu Shot  03/04/2023*   Pneumonia Vaccine  Completed   HPV Vaccine  Aged Out  *Topic was postponed. The date shown is not the original due date.    Conditions/risks identified: none new   Next appointment: Follow up in one year for your annual wellness visit.   Preventive Care 56 Years and Older, Male  Preventive care refers to lifestyle choices and visits with your health care provider that can promote health and wellness. What does preventive care include? A yearly physical exam. This is also called an annual well check. Dental exams once or twice a year. Routine eye exams. Ask your health care provider how often you should have your eyes checked. Personal lifestyle choices, including: Daily care of your teeth and gums. Regular physical activity. Eating a healthy diet. Avoiding tobacco and drug use. Limiting alcohol use. Practicing safe sex. Taking low doses of aspirin every day. Taking vitamin and mineral supplements as recommended by your health care provider. What happens during an annual well check? The services and screenings done by your health care provider during your annual well check will depend on your age, overall health, lifestyle risk factors, and family history of  disease. Counseling  Your health care provider may ask you questions about your: Alcohol use. Tobacco use. Drug use. Emotional well-being. Home and relationship well-being. Sexual activity. Eating habits. History of falls. Memory and ability to understand (cognition). Work and work Statistician. Screening  You may have the following tests or measurements: Height, weight, and BMI. Blood pressure. Lipid and cholesterol levels. These may be checked every 5 years, or more frequently if you are over 34 years old. Skin check. Lung cancer screening. You may have this screening every year starting at age 68 if you have a 30-pack-year history of smoking and currently smoke or have quit within the past 15 years. Fecal occult blood test (FOBT) of the stool. You may have this test every year starting at age 11. Flexible sigmoidoscopy or colonoscopy. You may have a sigmoidoscopy every 5 years or a colonoscopy every 10 years starting at age 67. Prostate cancer screening. Recommendations will vary depending on your family history and other risks. Hepatitis C blood test. Hepatitis B blood test. Sexually transmitted disease (STD) testing. Diabetes screening. This is done by checking your blood sugar (glucose) after you have not eaten for a while (fasting). You may have this done every 1-3 years. Abdominal aortic aneurysm (AAA) screening. You may need this if you are a current or former smoker. Osteoporosis. You may be screened starting at age 81 if you are at high risk. Talk with your health care provider  about your test results, treatment options, and if necessary, the need for more tests. Vaccines  Your health care provider may recommend certain vaccines, such as: Influenza vaccine. This is recommended every year. Tetanus, diphtheria, and acellular pertussis (Tdap, Td) vaccine. You may need a Td booster every 10 years. Zoster vaccine. You may need this after age 67. Pneumococcal 13-valent  conjugate (PCV13) vaccine. One dose is recommended after age 11. Pneumococcal polysaccharide (PPSV23) vaccine. One dose is recommended after age 39. Talk to your health care provider about which screenings and vaccines you need and how often you need them. This information is not intended to replace advice given to you by your health care provider. Make sure you discuss any questions you have with your health care provider. Document Released: 12/17/2015 Document Revised: 08/09/2016 Document Reviewed: 09/21/2015 Elsevier Interactive Patient Education  2017 [place] Prevention in the Home Falls can cause injuries. They can happen to people of all ages. There are many things you can do to make your home safe and to help prevent falls. What can I do on the outside of my home? Regularly fix the edges of walkways and driveways and fix any cracks. Remove anything that might make you trip as you walk through a door, such as a raised step or threshold. Trim any bushes or trees on the path to your home. Use bright outdoor lighting. Clear any walking paths of anything that might make someone trip, such as rocks or tools. Regularly check to see if handrails are loose or broken. Make sure that both sides of any steps have handrails. Any raised decks and porches should have guardrails on the edges. Have any leaves, snow, or ice cleared regularly. Use sand or salt on walking paths during winter. Clean up any spills in your garage right away. This includes oil or grease spills. What can I do in the bathroom? Use night lights. Install grab bars by the toilet and in the tub and shower. Do not use towel bars as grab bars. Use non-skid mats or decals in the tub or shower. If you need to sit down in the shower, use a plastic, non-slip stool. Keep the floor dry. Clean up any water that spills on the floor as soon as it happens. Remove soap buildup in the tub or shower regularly. Attach bath mats  securely with double-sided non-slip rug tape. Do not have throw rugs and other things on the floor that can make you trip. What can I do in the bedroom? Use night lights. Make sure that you have a light by your bed that is easy to reach. Do not use any sheets or blankets that are too big for your bed. They should not hang down onto the floor. Have a firm chair that has side arms. You can use this for support while you get dressed. Do not have throw rugs and other things on the floor that can make you trip. What can I do in the kitchen? Clean up any spills right away. Avoid walking on wet floors. Keep items that you use a lot in easy-to-reach places. If you need to reach something above you, use a strong step stool that has a grab bar. Keep electrical cords out of the way. Do not use floor polish or wax that makes floors slippery. If you must use wax, use non-skid floor wax. Do not have throw rugs and other things on the floor that can make you trip. What can I do  with my stairs? Do not leave any items on the stairs. Make sure that there are handrails on both sides of the stairs and use them. Fix handrails that are broken or loose. Make sure that handrails are as long as the stairways. Check any carpeting to make sure that it is firmly attached to the stairs. Fix any carpet that is loose or worn. Avoid having throw rugs at the top or bottom of the stairs. If you do have throw rugs, attach them to the floor with carpet tape. Make sure that you have a light switch at the top of the stairs and the bottom of the stairs. If you do not have them, ask someone to add them for you. What else can I do to help prevent falls? Wear shoes that: Do not have high heels. Have rubber bottoms. Are comfortable and fit you well. Are closed at the toe. Do not wear sandals. If you use a stepladder: Make sure that it is fully opened. Do not climb a closed stepladder. Make sure that both sides of the stepladder  are locked into place. Ask someone to hold it for you, if possible. Clearly mark and make sure that you can see: Any grab bars or handrails. First and last steps. Where the edge of each step is. Use tools that help you move around (mobility aids) if they are needed. These include: Canes. Walkers. Scooters. Crutches. Turn on the lights when you go into a dark area. Replace any light bulbs as soon as they burn out. Set up your furniture so you have a clear path. Avoid moving your furniture around. If any of your floors are uneven, fix them. If there are any pets around you, be aware of where they are. Review your medicines with your doctor. Some medicines can make you feel dizzy. This can increase your chance of falling. Ask your doctor what other things that you can do to help prevent falls. This information is not intended to replace advice given to you by your health care provider. Make sure you discuss any questions you have with your health care provider. Document Released: 09/16/2009 Document Revised: 04/27/2016 Document Reviewed: 12/25/2014 Elsevier Interactive Patient Education  2017 [place]. Opioid Pain Medicine Management Opioids are powerful medicines that are used to treat moderate to severe pain. When used for short periods of time, they can help you to: Sleep better. Do better in physical or occupational therapy. Feel better in the first few days after an injury. Recover from surgery. Opioids should be taken with the supervision of a trained health care provider. They should be taken for the shortest period of time possible. This is because opioids can be addictive, and the longer you take opioids, the greater your risk of addiction. This addiction can also be called opioid use disorder. What are the risks? Using opioid pain medicines for longer than 3 days increases your risk of side effects. Side effects include: Constipation. Nausea and vomiting. Breathing  difficulties (respiratory depression). Drowsiness. Confusion. Opioid use disorder. Itching. Taking opioid pain medicine for a long period of time can affect your ability to do daily tasks. It also puts you at risk for: Motor vehicle crashes. Depression. Suicide. Heart attack. Overdose, which can be life-threatening. What is a pain treatment plan? A pain treatment plan is an agreement between you and your health care provider. Pain is unique to each person, and treatments vary depending on your condition. To manage your pain, you and your health care  provider need to work together. To help you do this: Discuss the goals of your treatment, including how much pain you might expect to have and how you will manage the pain. Review the risks and benefits of taking opioid medicines. Remember that a good treatment plan uses more than one approach and minimizes the chance of side effects. Be honest about the amount of medicines you take and about any drug or alcohol use. Get pain medicine prescriptions from only one health care provider. Pain can be managed with many types of alternative treatments. Ask your health care provider to refer you to one or more specialists who can help you manage pain through: Physical or occupational therapy. Counseling (cognitive behavioral therapy). Good nutrition. Biofeedback. Massage. Meditation. Non-opioid medicine. Following a gentle exercise program. How to use opioid pain medicine Taking medicine Take your pain medicine exactly as told by your health care provider. Take it only when you need it. If your pain gets less severe, you may take less than your prescribed dose if your health care provider approves. If you are not having pain, do nottake pain medicine unless your health care provider tells you to take it. If your pain is severe, do nottry to treat it yourself by taking more pills than instructed on your prescription. Contact your health care  provider for help. Write down the times when you take your pain medicine. It is easy to become confused while on pain medicine. Writing the time can help you avoid overdose. Take other over-the-counter or prescription medicines only as told by your health care provider. Keeping yourself and others safe  While you are taking opioid pain medicine: Do not drive, use machinery, or power tools. Do not sign legal documents. Do not drink alcohol. Do not take sleeping pills. Do not supervise children by yourself. Do not do activities that require climbing or being in high places. Do not go to a lake, river, ocean, spa, or swimming pool. Do not share your pain medicine with anyone. Keep pain medicine in a locked cabinet or in a secure area where pets and children cannot reach it. Stopping your use of opioids If you have been taking opioid medicine for more than a few weeks, you may need to slowly decrease (taper) how much you take until you stop completely. Tapering your use of opioids can decrease your risk of symptoms of withdrawal, such as: Pain and cramping in the abdomen. Nausea. Sweating. Sleepiness. Restlessness. Uncontrollable shaking (tremors). Cravings for the medicine. Do not attempt to taper your use of opioids on your own. Talk with your health care provider about how to do this. Your health care provider may prescribe a step-down schedule based on how much medicine you are taking and how long you have been taking it. Getting rid of leftover pills Do not save any leftover pills. Get rid of leftover pills safely by: Taking the medicine to a prescription take-back program. This is usually offered by the county or law enforcement. Bringing them to a pharmacy that has a drug disposal container. Flushing them down the toilet. Check the label or package insert of your medicine to see whether this is safe to do. Throwing them out in the trash. Check the label or package insert of your  medicine to see whether this is safe to do. If it is safe to throw it out, remove the medicine from the original container, put it into a sealable bag or container, and mix it with used coffee grounds,  food scraps, dirt, or cat litter before putting it in the trash. Follow these instructions at home: Activity Do exercises as told by your health care provider. Avoid activities that make your pain worse. Return to your normal activities as told by your health care provider. Ask your health care provider what activities are safe for you. General instructions You may need to take these actions to prevent or treat constipation: Drink enough fluid to keep your urine pale yellow. Take over-the-counter or prescription medicines. Eat foods that are high in fiber, such as beans, whole grains, and fresh fruits and vegetables. Limit foods that are high in fat and processed sugars, such as fried or sweet foods. Keep all follow-up visits. This is important. Where to find support If you have been taking opioids for a long time, you may benefit from receiving support for quitting from a local support group or counselor. Ask your health care provider for a referral to these resources in your area. Where to find more information Centers for Disease Control and Prevention (CDC): http://www.wolf.info/ [place] Food and Drug Administration (FDA): [place] Get help right away if: You may have taken too much of an opioid (overdosed). Common symptoms of an overdose: Your breathing is slower or more shallow than normal. You have a very slow heartbeat (pulse). You have slurred speech. You have nausea and vomiting. Your pupils become very small. You have other potential symptoms: You are very confused. You faint or feel like you will faint. You have cold, clammy skin. You have blue lips or fingernails. You have thoughts of harming yourself or harming others. These symptoms may represent a serious problem that is an  emergency. Do not wait to see if the symptoms will go away. Get medical help right away. Call your local emergency services (911 in the [place] Do not drive yourself to the hospital.  If you ever feel like you may hurt yourself or others, or have thoughts about taking your own life, get help right away. Go to your nearest emergency department or: Call your local emergency services (911 in the [place] Call the [facility] 830 775 2941 in the [place] Call a suicide crisis helpline, such as the [place] at 2348580474 or 988 in the [place]. This is open 24 hours a day in the [place] Text the Crisis Text Line at (859)175-1120 (in the [place].). Summary Opioid medicines can help you manage moderate to severe pain for a short period of time. A pain treatment plan is an agreement between you and your health care provider. Discuss the goals of your treatment, including how much pain you might expect to have and how you will manage the pain. If you think that you or someone else may have taken too much of an opioid, get medical help right away. This information is not intended to replace advice given to you by your health care provider. Make sure you discuss any questions you have with your health care provider. Document Revised: 06/15/2021 Document Reviewed: 03/02/2021 Elsevier Patient Education  [place].

## 2022-11-04 ENCOUNTER — Other Ambulatory Visit: Payer: Self-pay | Admitting: Family Medicine

## 2022-11-04 DIAGNOSIS — E559 Vitamin D deficiency, unspecified: Secondary | ICD-10-CM

## 2022-11-04 DIAGNOSIS — I10 Essential (primary) hypertension: Secondary | ICD-10-CM

## 2022-11-04 DIAGNOSIS — E785 Hyperlipidemia, unspecified: Secondary | ICD-10-CM

## 2022-11-06 ENCOUNTER — Other Ambulatory Visit (INDEPENDENT_AMBULATORY_CARE_PROVIDER_SITE_OTHER): Payer: PPO

## 2022-11-06 DIAGNOSIS — I10 Essential (primary) hypertension: Secondary | ICD-10-CM

## 2022-11-06 DIAGNOSIS — E785 Hyperlipidemia, unspecified: Secondary | ICD-10-CM | POA: Diagnosis not present

## 2022-11-06 DIAGNOSIS — E559 Vitamin D deficiency, unspecified: Secondary | ICD-10-CM | POA: Diagnosis not present

## 2022-11-06 LAB — COMPREHENSIVE METABOLIC PANEL
ALT: 30 U/L (ref 0–53)
AST: 29 U/L (ref 0–37)
Albumin: 3.9 g/dL (ref 3.5–5.2)
Alkaline Phosphatase: 67 U/L (ref 39–117)
BUN: 18 mg/dL (ref 6–23)
CO2: 31 mEq/L (ref 19–32)
Calcium: 9.6 mg/dL (ref 8.4–10.5)
Chloride: 104 mEq/L (ref 96–112)
Creatinine, Ser: 0.77 mg/dL (ref 0.40–1.50)
GFR: 83.42 mL/min (ref >60.00)
Glucose, Bld: 86 mg/dL (ref 70–99)
Potassium: 3.9 mEq/L (ref 3.5–5.1)
Sodium: 144 mEq/L (ref 135–145)
Total Bilirubin: 0.5 mg/dL (ref 0.2–1.2)
Total Protein: 6.1 g/dL (ref 6.0–8.3)

## 2022-11-06 LAB — LIPID PANEL
Cholesterol: 190 mg/dL (ref 0–200)
HDL: 126.8 mg/dL (ref >39.00)
LDL Cholesterol: 41 mg/dL (ref 0–99)
NonHDL: 63.52
Total CHOL/HDL Ratio: 2
Triglycerides: 113 mg/dL (ref 0.0–149.0)
VLDL: 22.6 mg/dL (ref 0.0–40.0)

## 2022-11-06 LAB — MICROALBUMIN / CREATININE URINE RATIO
Creatinine,U: 162 mg/dL
Microalb Creat Ratio: 1.5 mg/g (ref 0.0–30.0)
Microalb, Ur: 2.4 mg/dL — ABNORMAL HIGH (ref 0.0–1.9)

## 2022-11-06 LAB — VITAMIN D 25 HYDROXY (VIT D DEFICIENCY, FRACTURES): VITD: 39.25 ng/mL (ref 30.00–100.00)

## 2022-11-10 ENCOUNTER — Encounter: Payer: Self-pay | Admitting: Family Medicine

## 2022-11-10 ENCOUNTER — Ambulatory Visit (INDEPENDENT_AMBULATORY_CARE_PROVIDER_SITE_OTHER): Payer: PPO | Admitting: Family Medicine

## 2022-11-10 VITALS — BP 118/64 | HR 100 | Temp 97.7°F | Ht 67.5 in | Wt 148.0 lb

## 2022-11-10 DIAGNOSIS — K219 Gastro-esophageal reflux disease without esophagitis: Secondary | ICD-10-CM

## 2022-11-10 DIAGNOSIS — I70213 Atherosclerosis of native arteries of extremities with intermittent claudication, bilateral legs: Secondary | ICD-10-CM

## 2022-11-10 DIAGNOSIS — F17200 Nicotine dependence, unspecified, uncomplicated: Secondary | ICD-10-CM

## 2022-11-10 DIAGNOSIS — I739 Peripheral vascular disease, unspecified: Secondary | ICD-10-CM | POA: Diagnosis not present

## 2022-11-10 DIAGNOSIS — C61 Malignant neoplasm of prostate: Secondary | ICD-10-CM | POA: Diagnosis not present

## 2022-11-10 DIAGNOSIS — J449 Chronic obstructive pulmonary disease, unspecified: Secondary | ICD-10-CM | POA: Diagnosis not present

## 2022-11-10 DIAGNOSIS — C3492 Malignant neoplasm of unspecified part of left bronchus or lung: Secondary | ICD-10-CM

## 2022-11-10 DIAGNOSIS — R1310 Dysphagia, unspecified: Secondary | ICD-10-CM

## 2022-11-10 DIAGNOSIS — I700 Atherosclerosis of aorta: Secondary | ICD-10-CM

## 2022-11-10 DIAGNOSIS — Z0001 Encounter for general adult medical examination with abnormal findings: Secondary | ICD-10-CM

## 2022-11-10 DIAGNOSIS — I10 Essential (primary) hypertension: Secondary | ICD-10-CM

## 2022-11-10 DIAGNOSIS — E559 Vitamin D deficiency, unspecified: Secondary | ICD-10-CM

## 2022-11-10 DIAGNOSIS — J9611 Chronic respiratory failure with hypoxia: Secondary | ICD-10-CM

## 2022-11-10 DIAGNOSIS — Z7189 Other specified counseling: Secondary | ICD-10-CM

## 2022-11-10 DIAGNOSIS — I6523 Occlusion and stenosis of bilateral carotid arteries: Secondary | ICD-10-CM | POA: Diagnosis not present

## 2022-11-10 DIAGNOSIS — E785 Hyperlipidemia, unspecified: Secondary | ICD-10-CM

## 2022-11-10 DIAGNOSIS — R011 Cardiac murmur, unspecified: Secondary | ICD-10-CM

## 2022-11-10 DIAGNOSIS — I708 Atherosclerosis of other arteries: Secondary | ICD-10-CM

## 2022-11-10 MED ORDER — MONTELUKAST SODIUM 10 MG PO TABS: ORAL_TABLET | ORAL | 4 refills | Status: AC

## 2022-11-10 MED ORDER — PANTOPRAZOLE SODIUM 40 MG PO TBEC: 40.0000 mg | DELAYED_RELEASE_TABLET | Freq: Two times a day (BID) | ORAL | 4 refills | Status: AC

## 2022-11-10 MED ORDER — GABAPENTIN 100 MG PO CAPS: 100.0000 mg | ORAL_CAPSULE | Freq: Every day | ORAL | 3 refills | Status: AC

## 2022-11-10 MED ORDER — ATORVASTATIN CALCIUM 40 MG PO TABS: 40.0000 mg | ORAL_TABLET | Freq: Every day | ORAL | 4 refills | Status: AC

## 2022-11-10 NOTE — Assessment & Plan Note (Signed)
Marked, anticipate contribution from known carotid and subclavian stenosis

## 2022-11-10 NOTE — Assessment & Plan Note (Addendum)
Continue yearly monitoring, next [place] due 07/2023.

## 2022-11-10 NOTE — Assessment & Plan Note (Signed)
Sees [facility] yearly, at high risk for intervention.

## 2022-11-10 NOTE — Assessment & Plan Note (Addendum)
S/p multiple esophageal stricture/rings dilated, latest 08/2022 Continues PPI BID

## 2022-11-10 NOTE — Assessment & Plan Note (Signed)
S/p SBRT earlier this year through rad onc ([place])

## 2022-11-10 NOTE — Assessment & Plan Note (Signed)
Continues aspirin, statin.  

## 2022-11-10 NOTE — Assessment & Plan Note (Addendum)
Severe, oxygen and prednisone dependent COPD, followed by Dr [name]. Appreciate pulm care.

## 2022-11-10 NOTE — Assessment & Plan Note (Signed)
Advanced directives: has living will at home, scanned into chart (09/2014). Wife is Ladean Raya. "No extraordinary efforts" does not want feeding tube. Receives palliative care through [facility].

## 2022-11-10 NOTE — Progress Notes (Signed)
Patient ID: Parker Martinez, male    DOB: [birth year], 81 y.o.   MRN: [id number]  This visit was conducted in person.  BP 118/64   Pulse 100   Temp 97.7 F (36.5 C) (Temporal)   Ht 5' 7.5" (1.715 m)   Wt 148 lb (67.1 kg)   SpO2 95% Comment: 2 L  BMI 22.84 kg/m    CC: CPE Subjective:   HPI: Parker Martinez is a 81 y.o. male presenting on 11/10/2022 for Annual Exam (MCR prt 2.)   Saw health advisor last week for medicare wellness visit. Note reviewed.    No results found.  Flowsheet Row Clinical Support from 11/03/2022 in [facility] [place]  PHQ-2 Total Score 0          11/03/2022    2:36 PM 11/02/2021    9:50 AM 10/20/2020    9:46 AM 10/20/2019   11:21 AM 10/15/2018   10:50 AM  Fall Risk   Falls in the past year? 0 0 0 0 0  Number falls in past yr: 0 0 0 0   Injury with Fall? 0 0 0 0   Risk for fall due to : No Fall Risks No Fall Risks Medication side effect Medication side effect   Follow up Falls evaluation completed Falls prevention discussed Falls evaluation completed;Falls prevention discussed Falls evaluation completed;Falls prevention discussed    Severe COPD continued smoker followed by pulmonology only on nocturnal O2 supplementation with chronic debilitating dyspnea followed by pulm Dr [name]. Takes prednisone 10mg  daily. Continues Breztri regularly with spacer. Also recommended regular albuterol neb use with Breztri to improve medicine delivery to lungs.  Treated this past year for non small cell lung cancer with empiric palliative radiation therapy to LUL lesion, not surgical candidate. Most recently treated by Dr Baruch Gouty with 10d azithromycin course given chest CT findings of possible necrotic pneumonia or aspiration.  He notes bedroom is upstairs which can be a problem.   Saw [facility]) 05/2022 for known PAD with claudication and rest pain - deemed too high risk for invasive procedure, planned yearly f/u, but sooner if worsening  symptoms.   Notes ongoing numbness and paresthesias to bilateral feet as well as left > right foot cramping as well as left hand cramping for years. Notes bilateral neck pain. Takes gabapentin 100mg  nightly with some benefit.   Notes worsening dizziness since Daliresp was started.   Recent EGD done for dysphagia - esophageal stricture dilated, biopsy with esophagitis, negative fungal testing. He continues PPI BID.   Preventative: COLONOSCOPY 10/2016 - severe diverticulosis, no f/u recommended [name]) H/o prostate cancer treated with prostate seeds 2016 (Dr. [name] at [facility]). Sees urologist Dr [name] regularly q56mo.  Lung cancer - see above.  Flu shot yearly COVID vaccine - PFizer 12/2019, 01/2020, booster 09/2020, bivalent booster 09/2021 Tetanus - unsure  Pneumonia 2011, prevnar-13 2015  RSV - 09/2022  Shingrix - discussed  Advanced directives: has living will at home, scanned into chart (09/2014). Wife is Ladean Raya. "No extraordinary efforts" does not want feeding tube. Rece[facility].  Seat belt use discussed  Sunscreen use discussed. No changing moles on skin.  Ex-smoker quit ~3 yrs ago Alcohol - 1-2 drinks/day - likes scotch  Dentist due but finances limit ability to seek care.  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 Interim medical history since our last visit reviewed. Allergies and medications reviewed and updated. Outpatient Medications Prior to Visit  Medication Sig Dispense Refill   acetaminophen (TYLENOL) 325 MG  tablet Take 1 tablet (325 mg total) by mouth every 6 (six) hours as needed for mild pain, fever or headache (or Fever >/= 101).     albuterol (PROVENTIL) (2.5 MG/3ML) 0.083% nebulizer solution INHALE 1 VIAL VIA NEBULIZER EVERY 4 HOURS AS NEEDED FOR WHEEZING OR SHORTNESS OF BREATH 375 mL 0   albuterol (VENTOLIN HFA) 108 (90 Base) MCG/ACT inhaler USE 2 PUFFS BY MOUTH EVERY 6 HOURS AS NEEDED FOR WHEEZING 18 each 2   alfuzosin (UROXATRAL) 10 MG 24 hr tablet Take 10 mg by mouth daily with breakfast.     aspirin 81 MG tablet Take 81 mg by mouth every morning.     Budeson-Glycopyrrol-Formoterol (BREZTRI AEROSPHERE) 160-9-4.8 MCG/ACT AERO Inhale 2 puffs into the lungs 2 (two) times daily. 10.7 g 11   EPINEPHrine (EPIPEN 2-PAK) 0.3 mg/0.3 mL IJ SOAJ injection Inject 0.3 mg into the muscle as needed for anaphylaxis. 2 each 1   fexofenadine (ALLEGRA) 180 MG tablet Take 180 mg by mouth in the morning.     ipratropium (ATROVENT) 0.06 % nasal spray SPRAY 2 SPRAYS INTO EACH NOSTRIL 4 TIMES A DAY AS NEEDED FOR RHINITIS 15 mL 1   Multiple Vitamin (MULTIVITAMIN) tablet Take 1 tablet by mouth in the morning.     Polyethyl Glycol-Propyl Glycol (LUBRICANT EYE DROPS) 0.4-0.3 % SOLN Place 1-2 drops into both eyes 3 (three) times daily as needed (dry/irritated eyes.).     predniSONE (DELTASONE) 10 MG tablet TAKE 1 TABLET (10 MG TOTAL) BY MOUTH DAILY WITH BREAKFAST. 30 tablet 6   Roflumilast 250 MCG TABS TAKE 1 TABLET BY MOUTH EVERY DAY 28 tablet 2   traMADol (ULTRAM) 50 MG tablet TAKE 1 TABLET BY MOUTH 2 TIMES DAILY AS NEEDED. 30 tablet 1   vitamin B-12 (CYANOCOBALAMIN) 500 MCG tablet Take 500 mcg by mouth every morning.      atorvastatin (LIPITOR) 40 MG tablet TAKE 1 TABLET BY MOUTH EVERY DAY 90 tablet 0   gabapentin (NEURONTIN) 100 MG capsule TAKE 1-2 CAPSULES (100-200 MG TOTAL) BY MOUTH AT BEDTIME. 60 capsule 0   montelukast (SINGULAIR) 10 MG tablet TAKE 1 TABLET BY MOUTH EVERYDAY AT BEDTIME 90 tablet 3   pantoprazole  (PROTONIX) 40 MG tablet Take 1 tablet (40 mg total) by mouth 2 (two) times daily before a meal. 180 tablet 3   azithromycin (ZITHROMAX Z-PAK) 250 MG tablet Take as directed 6 each 1   No facility-administered medications prior to visit.     Per HPI unless specifically indicated in ROS section below Review of Systems  Constitutional:  Negative for activity change, appetite change, chills, fatigue, fever and unexpected weight change.  HENT:  Negative for hearing loss.   Eyes:  Negative for visual disturbance.  Respiratory:  Positive for cough, shortness of breath and wheezing. Negative for chest tightness.   Cardiovascular:  Negative for chest pain, palpitations and leg swelling.  Gastrointestinal:  Negative for abdominal distention, abdominal pain, blood in stool, constipation, diarrhea, nausea and vomiting.  Genitourinary:  Negative  for difficulty urinating and hematuria.  Musculoskeletal:  Negative for arthralgias, myalgias and neck pain.  Skin:  Negative for rash.  Neurological:  Positive for dizziness (vertigo). Negative for seizures, syncope and headaches.  Hematological:  Negative for adenopathy. Does not bruise/bleed easily.  Psychiatric/Behavioral:  Negative for dysphoric mood. The patient is not nervous/anxious.     Objective:  BP 118/64   Pulse 100   Temp 97.7 F (36.5 C) (Temporal)   Ht 5' 7.5" (1.715 m)   Wt 148 lb (67.1 kg)   SpO2 95% Comment: 2 L  BMI 22.84 kg/m   Wt Readings from Last 3 Encounters:  11/10/22 148 lb (67.1 kg)  11/03/22 151 lb (68.5 kg)  10/18/22 151 lb 14.4 oz (68.9 kg)      Physical Exam Vitals and nursing note reviewed.  Constitutional:      General: He is not in acute distress.    Appearance: Normal appearance. He is well-developed. He is not ill-appearing.     Comments: Wearing oxygen via [place] with portable tank  HENT:     Head: Normocephalic and atraumatic.     Right Ear: Hearing, tympanic membrane, ear canal and external ear normal.      Left Ear: Hearing, tympanic membrane, ear canal and external ear normal.     Nose: Nose normal.     Mouth/Throat:     Mouth: Mucous membranes are moist.     Pharynx: Oropharynx is clear. No oropharyngeal exudate or posterior oropharyngeal erythema.  Eyes:     General: No scleral icterus.    Extraocular Movements: Extraocular movements intact.     Conjunctiva/sclera: Conjunctivae normal.     Pupils: Pupils are equal, round, and reactive to light.  Neck:     Thyroid: No thyroid mass or thyromegaly.     Vascular: Carotid bruit (harsh bilaterally) present.  Cardiovascular:     Rate and Rhythm: Normal rate and regular rhythm.     Pulses: Normal pulses.          Radial pulses are 2+ on the right side and 2+ on the left side.     Heart sounds: Murmur (3/6 USB) heard.  Pulmonary:     Effort: Pulmonary effort is normal. No respiratory distress.     Breath sounds: Wheezing (coarse throughout) present. No rhonchi or rales.  Abdominal:     General: Bowel sounds are normal. There is no distension.     Palpations: Abdomen is soft. There is no mass.     Tenderness: There is no abdominal tenderness. There is no guarding or rebound.     Hernia: No hernia is present.  Musculoskeletal:        General: Normal range of motion.     Cervical back: Normal range of motion and neck supple.     Right lower leg: No edema.     Left lower leg: No edema.  Lymphadenopathy:     Cervical: No cervical adenopathy.  Skin:    General: Skin is warm and dry.     Findings: No rash.  Neurological:     General: No focal deficit present.     Mental Status: He is alert and oriented to person, place, and time.  Psychiatric:        Mood and Affect: Mood normal.        Behavior: Behavior normal.        Thought Content: Thought content normal.        Judgment: Judgment normal.  Results for orders placed or performed in visit on 11/06/22  Microalbumin / creatinine urine ratio  Result Value Ref Range   Microalb,  Ur 2.4 (H) 0.0 - 1.9 mg/dL   Creatinine,U 162.0 mg/dL   Microalb Creat Ratio 1.5 0.0 - 30.0 mg/g  VITAMIN D 25 Hydroxy (Vit-D Deficiency, Fractures)  Result Value Ref Range   VITD 39.25 30.00 - 100.00 ng/mL  Comprehensive metabolic panel  Result Value Ref Range   Sodium 144 135 - 145 mEq/L   Potassium 3.9 3.5 - 5.1 mEq/L   Chloride 104 96 - 112 mEq/L   CO2 31 19 - 32 mEq/L   Glucose, Bld 86 70 - 99 mg/dL   BUN 18 6 - 23 mg/dL   Creatinine, Ser 0.77 0.40 - 1.50 mg/dL   Total Bilirubin 0.5 0.2 - 1.2 mg/dL   Alkaline Phosphatase 67 39 - 117 U/L   AST 29 0 - 37 U/L   ALT 30 0 - 53 U/L   Total Protein 6.1 6.0 - 8.3 g/dL   Albumin 3.9 3.5 - 5.2 g/dL   GFR 83.42 >60.00 mL/min   Calcium 9.6 8.4 - 10.5 mg/dL  Lipid panel  Result Value Ref Range   Cholesterol 190 0 - 200 mg/dL   Triglycerides 113.0 0.0 - 149.0 mg/dL   HDL 126.80 >39.00 mg/dL   VLDL 22.6 0.0 - 40.0 mg/dL   LDL Cholesterol 41 0 - 99 mg/dL   Total CHOL/HDL Ratio 2    NonHDL 63.52    Lab Results  Component Value Date   WBC 11.6 (H) 08/21/2022   HGB 12.0 (L) 08/21/2022   HCT 35.4 (L) 08/21/2022   MCV 96.8 08/21/2022   PLT 240.0 08/21/2022    Assessment & Plan:   Problem List Items Addressed This Visit       Unprioritized   Encounter for general adult medical examination with abnormal findings - Primary (Chronic)    Preventative protocols reviewed and updated unless pt declined. Discussed healthy diet and lifestyle.       Advanced care planning/counseling discussion (Chronic)    Advanced directives: has living will at home, scanned into chart (09/2014). Wife is Ladean Raya. "No extraordinary efforts" does not want feeding tube. Receives palliative care through [facility].       Smoker    Quit smoking a few years ago, with rare intermittent cigarette in interim.       COPD, severe ([place])    Severe, oxygen and prednisone dependent COPD, followed by Dr [name]. Appreciate pulm care.       Relevant  Medications   montelukast (SINGULAIR) 10 MG tablet   Prostate cancer [facility])    [place] [facility] Amalia Hailey) Q6 mo.       GERD (gastroesophageal reflux disease)    Continues PPI BID      Relevant Medications   pantoprazole (PROTONIX) 40 MG tablet   HTN (hypertension)    Chronic, stable off medication.       Relevant Medications   atorvastatin (LIPITOR) 40 MG tablet   PVD (peripheral vascular disease) with claudication (HCC)   Relevant Medications   atorvastatin (LIPITOR) 40 MG tablet   gabapentin (NEURONTIN) 100 MG capsule   Dyslipidemia    Chronic, stable on statin - continue. The ASCVD Risk score (Arnett DK, et al., 2019) failed to calculate for the following reasons:   The 2019 ASCVD risk score is only valid for ages 23 to 39       Relevant Medications  atorvastatin (LIPITOR) 40 MG tablet   Vitamin D deficiency    Vit D levels stable       Carotid atherosclerosis, bilateral    Continue yearly monitoring, next [place] due 07/2023.       Relevant Medications   atorvastatin (LIPITOR) 40 MG tablet   Atherosclerosis of native arteries of extremity with intermittent claudication (HCC)    Sees [facility] yearly, at high risk for intervention.      Relevant Medications   atorvastatin (LIPITOR) 40 MG tablet   gabapentin (NEURONTIN) 100 MG capsule   Heart murmur    Marked, anticipate contribution from known carotid and subclavian stenosis      Aorto-iliac atherosclerosis (HCC)    Continues aspirin, statin.      Relevant Medications   atorvastatin (LIPITOR) 40 MG tablet   Chronic respiratory failure with hypoxia (HCC)   Non-small cell lung cancer (HCC)    S/p SBRT earlier this year through rad onc ([place])      Dysphagia    S/p multiple esophageal stricture/rings dilated, latest 08/2022 Continues PPI BID        Meds ordered this encounter  Medications   atorvastatin (LIPITOR) 40 MG tablet    Sig: Take 1 tablet (40 mg total) by mouth daily.    Dispense:  90 tablet     Refill:  4   gabapentin (NEURONTIN) 100 MG capsule    Sig: Take 1-2 capsules (100-200 mg total) by mouth at bedtime.    Dispense:  180 capsule    Refill:  3   montelukast (SINGULAIR) 10 MG tablet    Sig: TAKE 1 TABLET BY MOUTH EVERYDAY AT BEDTIME    Dispense:  90 tablet    Refill:  4   pantoprazole (PROTONIX) 40 MG tablet    Sig: Take 1 tablet (40 mg total) by mouth 2 (two) times daily before a meal.    Dispense:  180 tablet    Refill:  4   No orders of the defined types were placed in this encounter.   Patient instructions: Find out dates of COVID and flu shot this year and let us know.  Continue current medicines.  Good to see you today Return as needed or in 12 months for physical.   Follow up plan: Return in about 1 year (around 11/11/2023) for annual exam, prior fasting for blood work, medicare wellness visit.  Ria Bush, MD

## 2022-11-10 NOTE — Assessment & Plan Note (Signed)
Quit smoking a few years ago, with rare intermittent cigarette in interim.

## 2022-11-10 NOTE — Assessment & Plan Note (Signed)
Preventative protocols reviewed and updated unless pt declined. Discussed healthy diet and lifestyle.  

## 2022-11-10 NOTE — Assessment & Plan Note (Signed)
[facility] [place] [facility] Amalia Hailey) Q6 mo.

## 2022-11-10 NOTE — Patient Instructions (Addendum)
Find out dates of COVID and flu shot this year and let us know.  Continue current medicines.  Good to see you today Return as needed or in 12 months for physical.   Health Maintenance After Age 82 After age 32, you are at a higher risk for certain long-term diseases and infections as well as injuries from falls. Falls are a major cause of broken bones and head injuries in people who are older than age 52. Getting regular preventive care can help to keep you healthy and well. Preventive care includes getting regular testing and making lifestyle changes as recommended by your health care provider. Talk with your health care provider about: Which screenings and tests you should have. A screening is a test that checks for a disease when you have no symptoms. A diet and exercise plan that is right for you. What should I know about screenings and tests to prevent falls? Screening and testing are the best ways to find a health problem early. Early diagnosis and treatment give you the best chance of managing medical conditions that are common after age 60. Certain conditions and lifestyle choices may make you more likely to have a fall. Your health care provider may recommend: Regular vision checks. Poor vision and conditions such as cataracts can make you more likely to have a fall. If you wear glasses, make sure to get your prescription updated if your vision changes. Medicine review. Work with your health care provider to regularly review all of the medicines you are taking, including over-the-counter medicines. Ask your health care provider about any side effects that may make you more likely to have a fall. Tell your health care provider if any medicines that you take make you feel dizzy or sleepy. Strength and balance checks. Your health care provider may recommend certain tests to check your strength and balance while standing, walking, or changing positions. Foot health exam. Foot pain and numbness, as  well as not wearing proper footwear, can make you more likely to have a fall. Screenings, including: Osteoporosis screening. Osteoporosis is a condition that causes the bones to get weaker and break more easily. Blood pressure screening. Blood pressure changes and medicines to control blood pressure can make you feel dizzy. Depression screening. You may be more likely to have a fall if you have a fear of falling, feel depressed, or feel unable to do activities that you used to do. Alcohol use screening. Using too much alcohol can affect your balance and may make you more likely to have a fall. Follow these instructions at home: Lifestyle Do not drink alcohol if: Your health care provider tells you not to drink. If you drink alcohol: Limit how much you have to: 0-1 drink a day for women. 0-2 drinks a day for men. Know how much alcohol is in your drink. In the [place] one drink equals one 12 oz bottle of beer (355 mL), one 5 oz glass of wine (148 mL), or one 1 oz glass of hard liquor (44 mL). Do not use any products that contain nicotine or tobacco. These products include cigarettes, chewing tobacco, and vaping devices, such as e-cigarettes. If you need help quitting, ask your health care provider. Activity  Follow a regular exercise program to stay fit. This will help you maintain your balance. Ask your health care provider what types of exercise are appropriate for you. If you need a cane or walker, use it as recommended by your health care provider. Wear supportive  shoes that have nonskid soles. Safety  Remove any tripping hazards, such as rugs, cords, and clutter. Install safety equipment such as grab bars in bathrooms and safety rails on stairs. Keep rooms and walkways well-lit. General instructions Talk with your health care provider about your risks for falling. Tell your health care provider if: You fall. Be sure to tell your health care provider about all falls, even ones that seem  minor. You feel dizzy, tiredness (fatigue), or off-balance. Take over-the-counter and prescription medicines only as told by your health care provider. These include supplements. Eat a healthy diet and maintain a healthy weight. A healthy diet includes low-fat dairy products, low-fat (lean) meats, and fiber from whole grains, beans, and lots of fruits and vegetables. Stay current with your vaccines. Schedule regular health, dental, and eye exams. Summary Having a healthy lifestyle and getting preventive care can help to protect your health and wellness after age 70. Screening and testing are the best way to find a health problem early and help you avoid having a fall. Early diagnosis and treatment give you the best chance for managing medical conditions that are more common for people who are older than age 73. Falls are a major cause of broken bones and head injuries in people who are older than age 61. Take precautions to prevent a fall at home. Work with your health care provider to learn what changes you can make to improve your health and wellness and to prevent falls. This information is not intended to replace advice given to you by your health care provider. Make sure you discuss any questions you have with your health care provider. Document Revised: 04/11/2021 Document Reviewed: 04/11/2021 Elsevier Patient Education  [place].

## 2022-11-10 NOTE — Assessment & Plan Note (Signed)
Vit D levels stable

## 2022-11-10 NOTE — Assessment & Plan Note (Signed)
Chronic, stable off medication.

## 2022-11-10 NOTE — Assessment & Plan Note (Signed)
Continues PPI BID.  

## 2022-11-10 NOTE — Assessment & Plan Note (Signed)
Chronic, stable on statin - continue. The ASCVD Risk score (Arnett DK, et al., 2019) failed to calculate for the following reasons:   The 2019 ASCVD risk score is only valid for ages 25 to 71

## 2022-11-17 DIAGNOSIS — H353132 Nonexudative age-related macular degeneration, bilateral, intermediate dry stage: Secondary | ICD-10-CM | POA: Diagnosis not present

## 2022-11-18 DIAGNOSIS — J449 Chronic obstructive pulmonary disease, unspecified: Secondary | ICD-10-CM | POA: Diagnosis not present

## 2022-11-22 ENCOUNTER — Encounter: Payer: Self-pay | Admitting: Pulmonary Disease

## 2022-11-22 ENCOUNTER — Ambulatory Visit (INDEPENDENT_AMBULATORY_CARE_PROVIDER_SITE_OTHER): Payer: PPO | Admitting: Pulmonary Disease

## 2022-11-22 VITALS — BP 124/84 | HR 112 | Temp 97.6°F | Ht 67.5 in | Wt 148.0 lb

## 2022-11-22 DIAGNOSIS — J9611 Chronic respiratory failure with hypoxia: Secondary | ICD-10-CM

## 2022-11-22 DIAGNOSIS — C3492 Malignant neoplasm of unspecified part of left bronchus or lung: Secondary | ICD-10-CM

## 2022-11-22 DIAGNOSIS — J449 Chronic obstructive pulmonary disease, unspecified: Secondary | ICD-10-CM | POA: Diagnosis not present

## 2022-11-22 DIAGNOSIS — R0609 Other forms of dyspnea: Secondary | ICD-10-CM

## 2022-11-22 DIAGNOSIS — J479 Bronchiectasis, uncomplicated: Secondary | ICD-10-CM

## 2022-11-22 NOTE — Patient Instructions (Addendum)
I provided you with some samples of ginger tea to try to see if this helps with your congestion.  Member to use your flutter valve this will help clear your congestion as well.  We are stopping the Roflumilast (Daliresp) as it is not helping you.  Use the Mucinex maximum strength (blue box) 3 to help with your congestion.  We will see you in follow-up in 3 months time call sooner should any new problems arise.

## 2022-11-22 NOTE — Progress Notes (Signed)
Subjective:    Patient ID: Parker Martinez, male    DOB: Oct 20, 1941, 81 y.o.   MRN: [id number] Patient Care Team: Eustaquio Boyden, MD as PCP - General (Family Medicine) [place] Crane, MD as Consulting Physician (Ophthalmology) Chuck Hint, MD as Consulting Physician (Vascular Surgery) Iva Boop, MD as Consulting Physician (Gastroenterology) Esaw Dace, MD as Attending Physician (Urology) Phil Dopp, [facility] as Pharmacist (Pharmacist) Salena Saner, MD as Consulting Physician (Pulmonary Disease)  Chief Complaint  Patient presents with   Follow-up    Constant SOB and wheezing. Dry cough. 2L O2 constant.   Requesting MD/Service: Eustaquio Boyden, MD Date of initial consultation: 05/06/18 by Dr. Billy Fischer Reason for consultation: Severe COPD, smoker   PT PROFILE: 81 y.o. male former smoker (previously > 1 PPD), with diagnosis of COPD rendered several years ago and with progressive DOE over past 5-7 years prior to initial consultation.   DATA: 10/19/10 Spirometry: Severe obstruction (FEV1 41% predicted) 05/28/18 PFTs: FVC: 2.13 > 2.96 L (58 > 80 %pred), FEV1: 0.85 > 1.04 L (30 > 36 %pred), FEV1/FVC: 40 >35%, TLC: 7.90 L (128 %pred), DLCO 50 %pred.  Flow volume curve consistent with severe obstruction 02/20/19 PFTs: FEV1 was 1.96 L or 48% predicted, FVC was 2.72 L or 95% predicted, FEV1/FVC was 38%.  TLC  131%, RV 165%, indicating hyperinflation and air trapping.  Diffusion capacity was 48%.  Findings consistent with severe obstruction and emphysema. 05/05/20 Overnight oximetry: Shows nocturnal desaturations and possible obstructive sleep apnea.  Patient will need sleep study 06/01/20 2D echo: LVEF 60 to 65%, no evidence of cor pulmonale, mild aortic sclerosis, diastolic dysfunction type I. 06/17/2020 chest x-ray two-view: Chronic COPD changes, upper lobe parenchymal scarring, right upper lobe nodules, stable. 10/21/2020 overnight oximetry:  Significant desaturations during the night oxygen desaturation index 24 oxygen desaturation events 188.  Low O2 sats 85%, patient will require oxygen at 2 L/min nocturnally. 07/27/2021 echocardiogram: LVEF 55%, no wall motion abnormalities, grade II DD, RV function normal.  No overt valvulopathy.  Since prior 2D echo diastolic dysfunction has worsened. 09/30/2021 CT chest: Emphysematous changes, multiple nodules, mucous retention, focal bronchiectatic changes on left. 10/20/2021 PET/CT: The left-sided nodules have very low-grade activity likely related to apical scarring suggest short-term follow-up. 02/21/2022 chest CT without contrast: Severe pulmonary emphysema, enlargement of LEFT upper lobe nodule along bandlike changes, stable left upper lobe nodule and cystic area within nodularity of the left upper lobe nodule, new LEFT lower lobe nodule consistent with infection but needs attention on follow-up, areas of groundglass on the RIGHT upper lobe. 03/31/2022 PET/CT: The left upper lobe pulmonary lesion is now hypermetabolic and consistent with primary lung neoplasm  HPI Parker Martinez is an 81 year old former smoker (quit January 2023, 60 PY) with very severe COPD and chronic debilitating dyspnea. He was last seen on 23 August 2022.  At a prior visit we had recommended chest vest to help with clearance of secretions however, the patient did not want to proceed due to cost.  He has been placed on Daliresp and this has helped with his secretion clearance.  Also uses Acapella flutter valve.  He has had issues with left upper lobe nodules that on prior CT showed very low grade activity likely related to inflammatory change/inflammation.  However he had a chest CT on 21 February 2022 to follow-up on these lesions and unfortunately he has had growth on the left upper lobe nodule which is highly suspicious for malignancy.  I  discussed with the patient that he is not a candidate for invasive procedures however, he  underwent PET/CT on 28 April which showed activity on the left upper lobe pulmonary lesion that was consistent with malignancy.  He was referred to radiation oncology for empiric SBRT given that he is not a candidate for invasive procedures nor for operative procedures.  He completed SBRT to that area without any sequela.  Overall he actually looks fairly good today. He presents in transport chair due to dyspnea. Does not endorse any new symptomatology.  Previously he had noted his secretions were better since starting the Daliresp however this has become less effective.  He is using Acapella regularly.  He still prefers Breztri to any other medication for his COPD.  He has not had any hemoptysis.  Weight has been stable. He is compliant with oxygen at 2 L/min pulse.  Notes that this therapy helps him.  In the past he has felt that Mucinex helps him the most with his secretion clearance.  I discussed consideration for vest physiotherapy again however the he continues to be blocked and to consider this.   He does not endorse any new issues today.   Review of Systems A 10 point review of systems was performed and it is as noted above otherwise negative.  Patient Active Problem List   Diagnosis Date Noted   Dysphagia    Non-small cell lung cancer (HCC) 05/10/2022   Pain and swelling of right lower leg 04/12/2022   Palliative care status 11/08/2021   Pulmonary hypertension (HCC) 05/19/2021   Orthostatic dizziness 05/19/2021   Chronic respiratory failure with hypoxia (HCC) 02/25/2021   Protein-calorie malnutrition (HCC) 11/04/2020   Tachycardia 10/20/2020   Right foot drop 09/21/2020   Pedal edema 09/07/2020   Aorto-iliac atherosclerosis (HCC) 01/25/2019   Left lumbar radiculopathy 01/24/2019   Bilateral hearing loss 10/21/2018   BPV (benign positional vertigo), right 09/09/2018   Heart murmur 09/09/2018   Encounter for chronic pain management 05/15/2017   Chronic pain in left shoulder  01/30/2017   Subclavian steal syndrome 10/12/2016   Atherosclerosis of native arteries of extremity with intermittent claudication (HCC) 10/21/2014   Medicare annual wellness visit, subsequent 09/11/2014   Carotid atherosclerosis, bilateral 09/11/2014   Advanced care planning/counseling discussion 09/11/2014   Vitamin D deficiency 09/01/2014   DDD (degenerative disc disease), lumbar    Personal history of colonic adenomas 09/02/2013   COPD exacerbation (HCC) 03/04/2013   Encounter for general adult medical examination with abnormal findings 04/25/2012   Polycythemia secondary to smoking 04/25/2012   Dyslipidemia 04/25/2012   HTN (hypertension)    PVD (peripheral vascular disease) with claudication (HCC)    COPD, severe (HCC)    Prostate cancer (HCC)    GERD (gastroesophageal reflux disease)    Idiopathic anaphylaxis    Smoker 09/13/2011   Esophageal rings - lower esophagus 06/04/2009   Social History   Tobacco Use   Smoking status: Former    Packs/day: 1.00    Years: 60.00    Total pack years: 60.00    Types: Cigarettes    Quit date: 12/13/2021    Years since quitting: 0.9   Smokeless tobacco: Never   Tobacco comments:            5-6 cigs pd- 12/13/21     Quit in January   Substance Use Topics   Alcohol use: Yes    Alcohol/week: 14.0 standard drinks of alcohol    Types: 14 Cans of beer per  week    Comment: socially   Allergies  Allergen Reactions   Flonase [Fluticasone Propionate] Other (See Comments)    Per pt, causes rebound effect    Immunization History  Administered Date(s) Administered   Fluad Quad(high Dose 65+) 09/09/2019, 09/07/2020   Influenza, High Dose Seasonal PF 09/07/2021   Influenza,inj,Quad PF,6+ Mos 09/11/2014, 09/24/2015, 09/20/2016, 09/14/2017, 08/29/2018   Influenza-Unspecified 10/23/2022   PFIZER(Purple Top)SARS-COV-2 Vaccination 12/20/2019, 01/10/2020, 09/05/2020   Pfizer Covid-19 Vaccine Bivalent Booster 86yrs & up 09/07/2021    Pneumococcal Conjugate-13 09/11/2014   Pneumococcal Polysaccharide-23 12/04/2009   Respiratory Syncytial Virus Vaccine,Recomb Aduvanted(Arexvy) 10/03/2022, 10/23/2022       Objective:   Physical Exam BP 124/84 (BP Location: Left Arm, Cuff Size: Normal)   Pulse (!) 112   Temp 97.6 F (36.4 C)   Ht 5' 7.5" (1.715 m)   Wt 148 lb (67.1 kg) Comment: last recorded weight.patient is in a wheelchair  SpO2 95%   BMI 22.84 kg/m   SpO2: 95 % O2 Device: Nasal cannula O2 Flow Rate (L/min): 2 L/min  GENERAL: Well-groomed elderly male, no acute distress.  Use of accessories, chronic.  Presents in transport chair (due to dyspnea).  No conversational dyspnea. HEAD: Normocephalic, atraumatic. EYES: Pupils equal, round, reactive to light.  No scleral icterus. MOUTH: Nose/mouth/throat not examined due to masking requirements for COVID 19. NECK: Supple. No thyromegaly. Trachea midline. No JVD.  No adenopathy. PULMONARY: Distant breath sounds, poor air movement, coarse breath sounds with no wheezes noted.   CARDIOVASCULAR: S1 and S2.  Tachycardic rate with regular rhythm.    Grade 1/6 to 2/6 systolic ejection murmur left sternal border. GASTROINTESTINAL: Benign. MUSCULOSKELETAL: No joint deformity, no clubbing, no edema. NEUROLOGIC: No overt focal deficit noted.  Speech is fluent. SKIN: No rashes, port wine nevus on middle finger left hand. PSYCH: Mood and behavior are normal     Assessment & Plan:     ICD-10-CM   1. Stage 4 very severe COPD by GOLD classification (HCC)  J44.9    Continue Breztri 2 puffs twice a day Continue as needed albuterol, Discontinue Daliresp, patient deems ineffective    2. Chronic respiratory failure with hypoxia (HCC)  J96.11    Continue oxygen supplementation at 2 L/min Patient compliant with therapy Patient notes benefit of therapy    3. Severe exertional dyspnea, likely fully explained by COPD  R06.09    This is likely due to very severe/end stage  COPD Discussed palliative care/hospice Patient still wants to defer    4. Bronchiectasis without complication (HCC)  J47.9    Recommended vest physiotherapy Patient wants to defer    5. Non-small cell cancer of left lung (HCC)  C34.92    Status post empiric SBRT     Recommended that patient use Mucinex maximum strength to help with issues of difficult to manage secretions.  Also recommend ongoing use of Acapella flutter valve.  Will see the patient in follow-up in 3 months time he is to contact us prior to that time should any new difficulties arise.  Gailen Shelter, MD Advanced Bronchoscopy PCCM [facility]    *This note was dictated using voice recognition software/Dragon.  Despite best efforts to proofread, errors can occur which can change the meaning. Any transcriptional errors that result from this process are unintentional and may not be fully corrected at the time of dictation.

## 2022-12-04 DIAGNOSIS — J449 Chronic obstructive pulmonary disease, unspecified: Secondary | ICD-10-CM | POA: Diagnosis not present

## 2022-12-06 ENCOUNTER — Encounter: Payer: Self-pay | Admitting: Family Medicine

## 2022-12-11 NOTE — Telephone Encounter (Signed)
error 

## 2022-12-19 DIAGNOSIS — J449 Chronic obstructive pulmonary disease, unspecified: Secondary | ICD-10-CM | POA: Diagnosis not present

## 2022-12-23 ENCOUNTER — Emergency Department: Payer: PPO

## 2022-12-23 ENCOUNTER — Other Ambulatory Visit: Payer: Self-pay

## 2022-12-23 ENCOUNTER — Inpatient Hospital Stay
Admission: EM | Admit: 2022-12-23 | Discharge: 2023-01-04 | DRG: 186 | Disposition: E | Payer: PPO | Attending: Internal Medicine | Admitting: Internal Medicine

## 2022-12-23 ENCOUNTER — Encounter: Payer: Self-pay | Admitting: Emergency Medicine

## 2022-12-23 ENCOUNTER — Inpatient Hospital Stay: Payer: PPO

## 2022-12-23 ENCOUNTER — Inpatient Hospital Stay: Admit: 2022-12-23 | Payer: PPO

## 2022-12-23 DIAGNOSIS — I739 Peripheral vascular disease, unspecified: Secondary | ICD-10-CM | POA: Diagnosis present

## 2022-12-23 DIAGNOSIS — G458 Other transient cerebral ischemic attacks and related syndromes: Secondary | ICD-10-CM | POA: Diagnosis present

## 2022-12-23 DIAGNOSIS — E870 Hyperosmolality and hypernatremia: Secondary | ICD-10-CM | POA: Diagnosis not present

## 2022-12-23 DIAGNOSIS — E872 Acidosis, unspecified: Secondary | ICD-10-CM | POA: Diagnosis present

## 2022-12-23 DIAGNOSIS — J948 Other specified pleural conditions: Secondary | ICD-10-CM | POA: Diagnosis not present

## 2022-12-23 DIAGNOSIS — R0789 Other chest pain: Secondary | ICD-10-CM | POA: Diagnosis not present

## 2022-12-23 DIAGNOSIS — Z66 Do not resuscitate: Secondary | ICD-10-CM | POA: Diagnosis present

## 2022-12-23 DIAGNOSIS — K5790 Diverticulosis of intestine, part unspecified, without perforation or abscess without bleeding: Secondary | ICD-10-CM | POA: Diagnosis present

## 2022-12-23 DIAGNOSIS — J939 Pneumothorax, unspecified: Secondary | ICD-10-CM | POA: Diagnosis not present

## 2022-12-23 DIAGNOSIS — I959 Hypotension, unspecified: Secondary | ICD-10-CM | POA: Diagnosis not present

## 2022-12-23 DIAGNOSIS — S2242XA Multiple fractures of ribs, left side, initial encounter for closed fracture: Secondary | ICD-10-CM | POA: Diagnosis present

## 2022-12-23 DIAGNOSIS — J439 Emphysema, unspecified: Secondary | ICD-10-CM | POA: Diagnosis not present

## 2022-12-23 DIAGNOSIS — N200 Calculus of kidney: Secondary | ICD-10-CM | POA: Diagnosis present

## 2022-12-23 DIAGNOSIS — W19XXXA Unspecified fall, initial encounter: Secondary | ICD-10-CM | POA: Diagnosis present

## 2022-12-23 DIAGNOSIS — G928 Other toxic encephalopathy: Secondary | ICD-10-CM | POA: Diagnosis not present

## 2022-12-23 DIAGNOSIS — K219 Gastro-esophageal reflux disease without esophagitis: Secondary | ICD-10-CM | POA: Diagnosis not present

## 2022-12-23 DIAGNOSIS — C3490 Malignant neoplasm of unspecified part of unspecified bronchus or lung: Secondary | ICD-10-CM | POA: Diagnosis present

## 2022-12-23 DIAGNOSIS — Z515 Encounter for palliative care: Secondary | ICD-10-CM | POA: Diagnosis not present

## 2022-12-23 DIAGNOSIS — J9610 Chronic respiratory failure, unspecified whether with hypoxia or hypercapnia: Secondary | ICD-10-CM

## 2022-12-23 DIAGNOSIS — Z83438 Family history of other disorder of lipoprotein metabolism and other lipidemia: Secondary | ICD-10-CM

## 2022-12-23 DIAGNOSIS — N3289 Other specified disorders of bladder: Secondary | ICD-10-CM | POA: Diagnosis not present

## 2022-12-23 DIAGNOSIS — I10 Essential (primary) hypertension: Secondary | ICD-10-CM | POA: Diagnosis present

## 2022-12-23 DIAGNOSIS — Z801 Family history of malignant neoplasm of trachea, bronchus and lung: Secondary | ICD-10-CM

## 2022-12-23 DIAGNOSIS — E440 Moderate protein-calorie malnutrition: Secondary | ICD-10-CM | POA: Diagnosis present

## 2022-12-23 DIAGNOSIS — R079 Chest pain, unspecified: Secondary | ICD-10-CM | POA: Diagnosis not present

## 2022-12-23 DIAGNOSIS — S2242XS Multiple fractures of ribs, left side, sequela: Secondary | ICD-10-CM | POA: Diagnosis not present

## 2022-12-23 DIAGNOSIS — J449 Chronic obstructive pulmonary disease, unspecified: Secondary | ICD-10-CM | POA: Diagnosis present

## 2022-12-23 DIAGNOSIS — F1029 Alcohol dependence with unspecified alcohol-induced disorder: Secondary | ICD-10-CM | POA: Diagnosis not present

## 2022-12-23 DIAGNOSIS — J9622 Acute and chronic respiratory failure with hypercapnia: Secondary | ICD-10-CM | POA: Diagnosis not present

## 2022-12-23 DIAGNOSIS — R5383 Other fatigue: Secondary | ICD-10-CM

## 2022-12-23 DIAGNOSIS — F1020 Alcohol dependence, uncomplicated: Secondary | ICD-10-CM | POA: Diagnosis not present

## 2022-12-23 DIAGNOSIS — M5136 Other intervertebral disc degeneration, lumbar region: Secondary | ICD-10-CM | POA: Diagnosis present

## 2022-12-23 DIAGNOSIS — F419 Anxiety disorder, unspecified: Secondary | ICD-10-CM | POA: Diagnosis not present

## 2022-12-23 DIAGNOSIS — Z8261 Family history of arthritis: Secondary | ICD-10-CM

## 2022-12-23 DIAGNOSIS — E8729 Other acidosis: Secondary | ICD-10-CM | POA: Diagnosis not present

## 2022-12-23 DIAGNOSIS — Z6823 Body mass index (BMI) 23.0-23.9, adult: Secondary | ICD-10-CM

## 2022-12-23 DIAGNOSIS — Z9981 Dependence on supplemental oxygen: Secondary | ICD-10-CM

## 2022-12-23 DIAGNOSIS — R1012 Left upper quadrant pain: Secondary | ICD-10-CM | POA: Diagnosis not present

## 2022-12-23 DIAGNOSIS — Z7982 Long term (current) use of aspirin: Secondary | ICD-10-CM

## 2022-12-23 DIAGNOSIS — K6289 Other specified diseases of anus and rectum: Secondary | ICD-10-CM | POA: Diagnosis not present

## 2022-12-23 DIAGNOSIS — R0902 Hypoxemia: Secondary | ICD-10-CM | POA: Diagnosis not present

## 2022-12-23 DIAGNOSIS — R0781 Pleurodynia: Secondary | ICD-10-CM

## 2022-12-23 DIAGNOSIS — S2249XA Multiple fractures of ribs, unspecified side, initial encounter for closed fracture: Secondary | ICD-10-CM | POA: Diagnosis present

## 2022-12-23 DIAGNOSIS — Z0389 Encounter for observation for other suspected diseases and conditions ruled out: Secondary | ICD-10-CM | POA: Diagnosis not present

## 2022-12-23 DIAGNOSIS — M1990 Unspecified osteoarthritis, unspecified site: Secondary | ICD-10-CM | POA: Diagnosis present

## 2022-12-23 DIAGNOSIS — R911 Solitary pulmonary nodule: Secondary | ICD-10-CM | POA: Diagnosis not present

## 2022-12-23 DIAGNOSIS — Z7952 Long term (current) use of systemic steroids: Secondary | ICD-10-CM

## 2022-12-23 DIAGNOSIS — C3492 Malignant neoplasm of unspecified part of left bronchus or lung: Secondary | ICD-10-CM | POA: Diagnosis not present

## 2022-12-23 DIAGNOSIS — J9621 Acute and chronic respiratory failure with hypoxia: Secondary | ICD-10-CM | POA: Diagnosis not present

## 2022-12-23 DIAGNOSIS — I2699 Other pulmonary embolism without acute cor pulmonale: Secondary | ICD-10-CM | POA: Diagnosis present

## 2022-12-23 DIAGNOSIS — Z85118 Personal history of other malignant neoplasm of bronchus and lung: Secondary | ICD-10-CM

## 2022-12-23 DIAGNOSIS — Z7951 Long term (current) use of inhaled steroids: Secondary | ICD-10-CM

## 2022-12-23 DIAGNOSIS — J90 Pleural effusion, not elsewhere classified: Secondary | ICD-10-CM | POA: Diagnosis not present

## 2022-12-23 DIAGNOSIS — Z79899 Other long term (current) drug therapy: Secondary | ICD-10-CM

## 2022-12-23 DIAGNOSIS — D649 Anemia, unspecified: Secondary | ICD-10-CM | POA: Diagnosis present

## 2022-12-23 DIAGNOSIS — I951 Orthostatic hypotension: Secondary | ICD-10-CM | POA: Diagnosis not present

## 2022-12-23 DIAGNOSIS — Z87891 Personal history of nicotine dependence: Secondary | ICD-10-CM

## 2022-12-23 DIAGNOSIS — Z7901 Long term (current) use of anticoagulants: Secondary | ICD-10-CM

## 2022-12-23 DIAGNOSIS — Z803 Family history of malignant neoplasm of breast: Secondary | ICD-10-CM

## 2022-12-23 DIAGNOSIS — Z8546 Personal history of malignant neoplasm of prostate: Secondary | ICD-10-CM

## 2022-12-23 LAB — CBC WITH DIFFERENTIAL/PLATELET
Abs Immature Granulocytes: 0.15 10*3/uL — ABNORMAL HIGH (ref 0.00–0.07)
Basophils Absolute: 0 10*3/uL (ref 0.0–0.1)
Basophils Relative: 0 %
Eosinophils Absolute: 0.2 10*3/uL (ref 0.0–0.5)
Eosinophils Relative: 2 %
HCT: 37.6 % — ABNORMAL LOW (ref 39.0–52.0)
Hemoglobin: 12.2 g/dL — ABNORMAL LOW (ref 13.0–17.0)
Immature Granulocytes: 1 %
Lymphocytes Relative: 31 %
Lymphs Abs: 3.5 10*3/uL (ref 0.7–4.0)
MCH: 31.4 pg (ref 26.0–34.0)
MCHC: 32.4 g/dL (ref 30.0–36.0)
MCV: 96.9 fL (ref 80.0–100.0)
Monocytes Absolute: 0.9 10*3/uL (ref 0.1–1.0)
Monocytes Relative: 8 %
Neutro Abs: 6.4 10*3/uL (ref 1.7–7.7)
Neutrophils Relative %: 58 %
Platelets: 204 10*3/uL (ref 150–400)
RBC: 3.88 MIL/uL — ABNORMAL LOW (ref 4.22–5.81)
RDW: 13.6 % (ref 11.5–15.5)
WBC: 11.1 10*3/uL — ABNORMAL HIGH (ref 4.0–10.5)
nRBC: 0 % (ref 0.0–0.2)

## 2022-12-23 LAB — HEPARIN LEVEL (UNFRACTIONATED): Heparin Unfractionated: 0.55 IU/mL (ref 0.30–0.70)

## 2022-12-23 LAB — COMPREHENSIVE METABOLIC PANEL
ALT: 24 U/L (ref 0–44)
AST: 25 U/L (ref 15–41)
Albumin: 3.6 g/dL (ref 3.5–5.0)
Alkaline Phosphatase: 58 U/L (ref 38–126)
Anion gap: 12 (ref 5–15)
BUN: 12 mg/dL (ref 8–23)
CO2: 25 mmol/L (ref 22–32)
Calcium: 9.2 mg/dL (ref 8.9–10.3)
Chloride: 101 mmol/L (ref 98–111)
Creatinine, Ser: 0.91 mg/dL (ref 0.61–1.24)
GFR, Estimated: >60 mL/min (ref >60)
Glucose, Bld: 95 mg/dL (ref 70–99)
Potassium: 3.8 mmol/L (ref 3.5–5.1)
Sodium: 138 mmol/L (ref 135–145)
Total Bilirubin: 0.4 mg/dL (ref 0.3–1.2)
Total Protein: 6.5 g/dL (ref 6.5–8.1)

## 2022-12-23 LAB — LACTIC ACID, PLASMA
Lactic Acid, Venous: 3.4 mmol/L (ref 0.5–1.9)
Lactic Acid, Venous: 3.4 mmol/L (ref 0.5–1.9)

## 2022-12-23 LAB — APTT: aPTT: 24 seconds (ref 24–36)

## 2022-12-23 LAB — PROTIME-INR
INR: 1 (ref 0.8–1.2)
Prothrombin Time: 12.6 seconds (ref 11.4–15.2)

## 2022-12-23 LAB — TROPONIN I (HIGH SENSITIVITY)
Troponin I (High Sensitivity): 10 ng/L (ref <18)
Troponin I (High Sensitivity): 9 ng/L (ref <18)

## 2022-12-23 LAB — LIPASE, BLOOD: Lipase: 29 U/L (ref 11–51)

## 2022-12-23 MED ORDER — ATORVASTATIN CALCIUM 20 MG PO TABS
40.0000 mg | ORAL_TABLET | Freq: Every day | ORAL | Status: DC
  Administered 2022-12-23 – 2022-12-25 (×3): 40 mg via ORAL
  Filled 2022-12-23 (×3): qty 2

## 2022-12-23 MED ORDER — ALBUTEROL SULFATE (2.5 MG/3ML) 0.083% IN NEBU
2.5000 mg | INHALATION_SOLUTION | RESPIRATORY_TRACT | Status: DC | PRN
  Administered 2022-12-24: 2.5 mg via RESPIRATORY_TRACT
  Filled 2022-12-23: qty 3

## 2022-12-23 MED ORDER — ADULT MULTIVITAMIN W/MINERALS CH
1.0000 | ORAL_TABLET | Freq: Every day | ORAL | Status: DC
  Administered 2022-12-23 – 2022-12-25 (×3): 1 via ORAL
  Filled 2022-12-23 (×3): qty 1

## 2022-12-23 MED ORDER — HEPARIN (PORCINE) 25000 UT/250ML-% IV SOLN
1150.0000 [IU]/h | INTRAVENOUS | Status: AC
  Administered 2022-12-23: 1150 [IU]/h via INTRAVENOUS
  Filled 2022-12-23 (×2): qty 250

## 2022-12-23 MED ORDER — ALFUZOSIN HCL ER 10 MG PO TB24
10.0000 mg | ORAL_TABLET | Freq: Every day | ORAL | Status: DC
  Administered 2022-12-24: 10 mg via ORAL
  Filled 2022-12-23 (×5): qty 1

## 2022-12-23 MED ORDER — LORAZEPAM 1 MG PO TABS: 1.0000 mg | ORAL_TABLET | ORAL | Status: DC | PRN

## 2022-12-23 MED ORDER — HEPARIN BOLUS VIA INFUSION
4000.0000 [IU] | Freq: Once | INTRAVENOUS | Status: AC
  Administered 2022-12-23: 4000 [IU] via INTRAVENOUS
  Filled 2022-12-23: qty 4000

## 2022-12-23 MED ORDER — POLYETHYL GLYCOL-PROPYL GLYCOL 0.4-0.3 % OP SOLN: 1.0000 [drp] | Freq: Three times a day (TID) | OPHTHALMIC | Status: DC | PRN

## 2022-12-23 MED ORDER — ACETAMINOPHEN 325 MG PO TABS
325.0000 mg | ORAL_TABLET | Freq: Four times a day (QID) | ORAL | Status: DC | PRN
  Administered 2022-12-23: 325 mg via ORAL
  Filled 2022-12-23: qty 1

## 2022-12-23 MED ORDER — ONDANSETRON HCL 4 MG PO TABS: 4.0000 mg | ORAL_TABLET | Freq: Four times a day (QID) | ORAL | Status: DC | PRN

## 2022-12-23 MED ORDER — LORAZEPAM 2 MG PO TABS: 0.0000 mg | ORAL_TABLET | Freq: Four times a day (QID) | ORAL | Status: DC

## 2022-12-23 MED ORDER — MORPHINE SULFATE (PF) 4 MG/ML IV SOLN
4.0000 mg | Freq: Once | INTRAVENOUS | Status: AC
  Administered 2022-12-23: 4 mg via INTRAVENOUS
  Filled 2022-12-23: qty 1

## 2022-12-23 MED ORDER — FOLIC ACID 1 MG PO TABS
1.0000 mg | ORAL_TABLET | Freq: Every day | ORAL | Status: DC
  Administered 2022-12-23 – 2022-12-25 (×3): 1 mg via ORAL
  Filled 2022-12-23 (×3): qty 1

## 2022-12-23 MED ORDER — ONDANSETRON HCL 4 MG/2ML IJ SOLN
4.0000 mg | INTRAMUSCULAR | Status: AC
  Administered 2022-12-23: 4 mg via INTRAVENOUS
  Filled 2022-12-23: qty 2

## 2022-12-23 MED ORDER — ROFLUMILAST 500 MCG PO TABS
250.0000 ug | ORAL_TABLET | Freq: Every day | ORAL | Status: DC
  Administered 2022-12-24: 250 ug via ORAL
  Filled 2022-12-23 (×6): qty 1

## 2022-12-23 MED ORDER — ASPIRIN 81 MG PO TBEC
81.0000 mg | DELAYED_RELEASE_TABLET | Freq: Every day | ORAL | Status: DC
  Administered 2022-12-23 – 2022-12-24 (×2): 81 mg via ORAL
  Filled 2022-12-23 (×2): qty 1

## 2022-12-23 MED ORDER — ONDANSETRON HCL 4 MG/2ML IJ SOLN: 4.0000 mg | Freq: Four times a day (QID) | INTRAMUSCULAR | Status: DC | PRN

## 2022-12-23 MED ORDER — ONE-DAILY MULTI VITAMINS PO TABS: 1.0000 | ORAL_TABLET | Freq: Every morning | ORAL | Status: DC

## 2022-12-23 MED ORDER — CYANOCOBALAMIN 500 MCG PO TABS
500.0000 ug | ORAL_TABLET | Freq: Every day | ORAL | Status: DC
  Administered 2022-12-23 – 2022-12-25 (×3): 500 ug via ORAL
  Filled 2022-12-23 (×5): qty 1

## 2022-12-23 MED ORDER — FLUTICASONE FUROATE-VILANTEROL 200-25 MCG/ACT IN AEPB
1.0000 | INHALATION_SPRAY | Freq: Every day | RESPIRATORY_TRACT | Status: DC
  Administered 2022-12-24 – 2022-12-25 (×2): 1 via RESPIRATORY_TRACT
  Filled 2022-12-23: qty 28

## 2022-12-23 MED ORDER — PANTOPRAZOLE SODIUM 40 MG PO TBEC
40.0000 mg | DELAYED_RELEASE_TABLET | Freq: Two times a day (BID) | ORAL | Status: DC
  Administered 2022-12-23 – 2022-12-25 (×4): 40 mg via ORAL
  Filled 2022-12-23 (×4): qty 1

## 2022-12-23 MED ORDER — LACTATED RINGERS IV BOLUS
1000.0000 mL | Freq: Once | INTRAVENOUS | Status: AC
  Administered 2022-12-23: 1000 mL via INTRAVENOUS

## 2022-12-23 MED ORDER — LORAZEPAM 2 MG PO TABS: 0.0000 mg | ORAL_TABLET | Freq: Two times a day (BID) | ORAL | Status: DC

## 2022-12-23 MED ORDER — KETOROLAC TROMETHAMINE 30 MG/ML IJ SOLN
15.0000 mg | Freq: Once | INTRAMUSCULAR | Status: AC
  Administered 2022-12-23: 15 mg via INTRAVENOUS
  Filled 2022-12-23: qty 1

## 2022-12-23 MED ORDER — UMECLIDINIUM BROMIDE 62.5 MCG/ACT IN AEPB
1.0000 | INHALATION_SPRAY | Freq: Every day | RESPIRATORY_TRACT | Status: DC
  Administered 2022-12-24 – 2022-12-25 (×2): 1 via RESPIRATORY_TRACT
  Filled 2022-12-23: qty 7

## 2022-12-23 MED ORDER — MONTELUKAST SODIUM 10 MG PO TABS
10.0000 mg | ORAL_TABLET | Freq: Every day | ORAL | Status: DC
  Administered 2022-12-23 – 2022-12-24 (×2): 10 mg via ORAL
  Filled 2022-12-23 (×2): qty 1

## 2022-12-23 MED ORDER — IPRATROPIUM-ALBUTEROL 0.5-2.5 (3) MG/3ML IN SOLN
3.0000 mL | Freq: Four times a day (QID) | RESPIRATORY_TRACT | Status: DC
  Administered 2022-12-23 – 2022-12-27 (×14): 3 mL via RESPIRATORY_TRACT
  Filled 2022-12-23 (×16): qty 3

## 2022-12-23 MED ORDER — HYDROCODONE-ACETAMINOPHEN 5-325 MG PO TABS
1.0000 | ORAL_TABLET | ORAL | Status: DC | PRN
  Administered 2022-12-23 – 2022-12-24 (×4): 1 via ORAL
  Filled 2022-12-23 (×4): qty 1

## 2022-12-23 MED ORDER — BUDESON-GLYCOPYRROL-FORMOTEROL 160-9-4.8 MCG/ACT IN AERO: 2.0000 | INHALATION_SPRAY | Freq: Two times a day (BID) | RESPIRATORY_TRACT | Status: DC

## 2022-12-23 MED ORDER — GABAPENTIN 100 MG PO CAPS
100.0000 mg | ORAL_CAPSULE | Freq: Every day | ORAL | Status: DC
  Administered 2022-12-23: 200 mg via ORAL
  Administered 2022-12-24: 100 mg via ORAL
  Filled 2022-12-23: qty 2
  Filled 2022-12-23: qty 1

## 2022-12-23 MED ORDER — THIAMINE HCL 100 MG/ML IJ SOLN
100.0000 mg | Freq: Every day | INTRAMUSCULAR | Status: DC
  Filled 2022-12-23: qty 2

## 2022-12-23 MED ORDER — THIAMINE MONONITRATE 100 MG PO TABS
100.0000 mg | ORAL_TABLET | Freq: Every day | ORAL | Status: DC
  Administered 2022-12-23 – 2022-12-25 (×3): 100 mg via ORAL
  Filled 2022-12-23 (×3): qty 1

## 2022-12-23 MED ORDER — LORAZEPAM 2 MG/ML IJ SOLN
1.0000 mg | INTRAMUSCULAR | Status: DC | PRN
  Administered 2022-12-25 (×2): 4 mg via INTRAVENOUS
  Administered 2022-12-25: 2 mg via INTRAVENOUS
  Administered 2022-12-26 (×2): 4 mg via INTRAVENOUS
  Filled 2022-12-23 (×3): qty 2
  Filled 2022-12-23: qty 1
  Filled 2022-12-23: qty 2

## 2022-12-23 MED ORDER — POLYVINYL ALCOHOL 1.4 % OP SOLN: 1.0000 [drp] | OPHTHALMIC | Status: DC | PRN

## 2022-12-23 MED ORDER — MORPHINE SULFATE (PF) 2 MG/ML IV SOLN
2.0000 mg | INTRAVENOUS | Status: DC | PRN
  Administered 2022-12-23: 2 mg via INTRAVENOUS
  Filled 2022-12-23: qty 1

## 2022-12-23 MED ORDER — PREDNISONE 10 MG PO TABS
10.0000 mg | ORAL_TABLET | Freq: Every day | ORAL | Status: DC
  Administered 2022-12-24: 10 mg via ORAL
  Filled 2022-12-23: qty 1

## 2022-12-23 MED ORDER — IOHEXOL 350 MG/ML SOLN
100.0000 mL | Freq: Once | INTRAVENOUS | Status: AC | PRN
  Administered 2022-12-23: 100 mL via INTRAVENOUS

## 2022-12-23 NOTE — Assessment & Plan Note (Signed)
Continue PPI

## 2022-12-23 NOTE — ED Notes (Signed)
Pts wife is going home and is requesting a call for info or potential D/C. Maralyn Sago [phone]

## 2022-12-23 NOTE — Assessment & Plan Note (Signed)
Most likely secondary to his underlying COPD No evidence of an infectious process at this time Monitor closely

## 2022-12-23 NOTE — ED Notes (Signed)
Pt to CT

## 2022-12-23 NOTE — Assessment & Plan Note (Deleted)
Blood pressure is stable Monitor closely during this hospitalization

## 2022-12-23 NOTE — Assessment & Plan Note (Addendum)
Patient has a history of stage I non-small cell lung cancer of the left upper lobe and is status post SBRT Follow-up as outpatient.

## 2022-12-23 NOTE — Assessment & Plan Note (Addendum)
Patient still in quite a bit of pain with 3 rib fractures.  Incentive spirometry.  Continue IV and p.o. pain medications but decrease the dose of the IV pain medication since more lethargic today.  Patient placed on continuous BiPAP yesterday in order to oxygenate with his severe pain.  On 35% FiO2.

## 2022-12-23 NOTE — Assessment & Plan Note (Addendum)
Continue atorvastatin if able to take

## 2022-12-23 NOTE — H&P (Addendum)
History and Physical    Patient: Parker Martinez TPR:[id number] DOB: 31-Mar-1941 DOA: 12/11/2022 DOS: the patient was seen and examined on 12/05/2022 PCP: Eustaquio Boyden, MD  Patient coming from: Home  Chief Complaint:  Chief Complaint  Patient presents with   Abdominal Pain   HPI: Parker Martinez is a 82 y.o. male with medical history significant for diverticulosis, COPD with chronic respiratory failure on 2 L of oxygen, peripheral arterial disease, history of stage I none small cell lung cancer status post SBRT, hypertension, GERD, alcohol dependence, degenerative disc disease who presents to the ER via EMS for evaluation of left lower lateral chest wall pain that he said woke him up out of his sleep.  He describes the pain as a sharp continuous pain rated an 8 x 10 in intensity at its worst with no relieving factors.  Pain is worse with inspiration or any form of movement.  Chest pain is associated with shortness of breath but he denies having any cough, no fever, no chills, no nausea, no vomiting, no headache, no dizziness, no lightheadedness, no urinary symptoms, no changes in his bowel habits, no focal deficit or blurred vision.  He denied any recent falls or trauma but according to his wife he fell about a month ago. CT angiogram of the chest is positive for acute pulmonary embolus at the left lingula and lower lobe pulmonary artery bifurcation. Additional lingula thrombus appears occlusive. No superimposed saddle embolus or CTA evidence of right heart strain. No evidence of pulmonary infarct at this time. No pleural effusion. Positive also for nondisplaced fractures of the left lateral 9th and 10th ribs, new since November and appear to be posttraumatic. Underlying advanced chronic lung disease, Emphysema treated left upper lobe stable since November restaging CT. indeterminate, spiculated left lower lobe pulmonary nodules (series 3, images 73-87) were previously thought to be  inflammatory but persist. Although no growth since November. Continued cancer surveillance recommended. regressed but not resolved right upper lobe part solid, part cavitary lesion. If this has not been specifically treated then regression favors benign etiology. Associated generalized thickening of the right lung airways extending to the hilum appears stable. Patient has been started on a heparin drip and will be admitted to the hospital for further evaluation    Review of Systems: As mentioned in the history of present illness. All other systems reviewed and are negative. Past Medical History:  Diagnosis Date   Abnormal drug screen 10/2013, 03/2015   low Cr, +EtOH, neg hydrocodone (10/2013, again 03/2015)   Allergic rhinitis    Arthritis    lumbar and cervical spine   Carotid stenosis 09/11/2014   40-59% bilat ICA stenosis, rpt 1 yr (09/2014)    COPD (chronic obstructive pulmonary disease) (HCC)    severe, participated in [facility] study   DDD (degenerative disc disease)    lumbar (HNP R L3/4) s/p ESI, cervical   Diverticulitis 2015   by CT   Diverticulosis 2008   severe by colonoscopy and CT   Duodenitis    Esophageal stricture 2012   s/p dilation   GERD (gastroesophageal reflux disease)    Hemangioma    congenital left arm   History of kidney stones remote   HTN (hypertension)    borderline readings at home   Idiopathic anaphylaxis    h/o recurrent anaphylaxis, unknown trigger, s/p eval at [facility] (1970[facility]), last episode 03/2011   PAD (peripheral artery disease) (HCC)    Personal history of colonic adenomas  09/02/2013   Prostate cancer (HCC) 2011   prostate seed implant (Dr. Earlene Plater)   Smoker    Status post dilatation of esophageal stricture    Subclavian steal syndrome 10/12/2016   Left subclavian steal, with 18 mmHg brachial artery pressure gradient (10/2016). If symptomatic, rec PV consult   Past Surgical History:  Procedure Laterality Date   BALLOON DILATION  N/A 10/02/2014   Procedure: BALLOON DILATION;  Surgeon: Iva Boop, MD;  Location: [facility];  Service: Endoscopy;  Laterality: N/A;   BIOPSY  09/11/2022   Procedure: BIOPSY;  Surgeon: Iva Boop, MD;  Location: [facility];  Service: Gastroenterology;;   CATARACT EXTRACTION W/PHACO Right 01/26/2020   Procedure: CATARACT EXTRACTION PHACO AND INTRAOCULAR LENS PLACEMENT (IOC) RIGHT 5.15  00:42.9;  Surgeon: Nevada Crane, MD;  Location: [facility];  Service: Ophthalmology;  Laterality: Right;   CATARACT EXTRACTION W/PHACO Left 02/16/2020   Procedure: CATARACT EXTRACTION PHACO AND INTRAOCULAR LENS PLACEMENT (IOC) LEFT 3.87  00:45.4;  Surgeon: Nevada Crane, MD;  Location: [facility];  Service: Ophthalmology;  Laterality: Left;   COLONOSCOPY  08/2013   4 tubular adenomas, severe divertic, rpt 3 yrs Leone Payor)   COLONOSCOPY  10/2016   severe diverticulosis, no f/u recommended Leone Payor)   ESOPHAGOGASTRODUODENOSCOPY  10/2016   2 rings dilated Leone Payor)   ESOPHAGOGASTRODUODENOSCOPY  01/2019   dilated esophageal webs Leone Payor)   ESOPHAGOGASTRODUODENOSCOPY (EGD) WITH PROPOFOL N/A 10/02/2014   WNL, mild esophagitis; Iva Boop, MD   ESOPHAGOGASTRODUODENOSCOPY (EGD) WITH PROPOFOL N/A 09/11/2022   dilated esophageal stenosis with multiple distal estophageal rings, biopsy: acute esophagitis, GERD Leone Payor, Maryjean Morn, MD)   INSERTION PROSTATE RADIATION SEED  01/2015   Earlene Plater   LUMBAR MICRODISCECTOMY Left 03/2019   L L4/L5 Yetta Barre)   SHOULDER SURGERY  1961   due to chronic dislocation   SPIROMETRY  10/2010   severe obstruction, FEV1 41%, ratio 0.46   TONSILLECTOMY  1944 ?   UPPER GASTROINTESTINAL ENDOSCOPY  06/08/2009   dilated esophageal stricture, duodenitis   virtual colonoscopy  2007   [name], rpt rec 5 yrs   Social History:  reports that he quit smoking about a year ago. His smoking use included cigarettes. He has a 60.00 pack-year smoking history. He has  never used smokeless tobacco. He reports current alcohol use of about 14.0 standard drinks of alcohol per week. He reports that he does not use drugs.  Allergies  Allergen Reactions   Flonase [Fluticasone Propionate] Other (See Comments)    Per pt, causes rebound effect     Family History  Problem Relation Age of Onset   Lung cancer Father    Hyperlipidemia Father    Arthritis Mother    Breast cancer Daughter    Coronary artery disease Neg Hx    Stroke Neg Hx    Diabetes Neg Hx     Prior to Admission medications   Medication Sig Start Date End Date Taking? Authorizing Provider  acetaminophen (TYLENOL) 325 MG tablet Take 1 tablet (325 mg total) by mouth every 6 (six) hours as needed for mild pain, fever or headache (or Fever >/= 101). 10/28/20   Rhetta Mura, MD  albuterol (PROVENTIL) (2.5 MG/3ML) 0.083% nebulizer solution INHALE 1 VIAL VIA NEBULIZER EVERY 4 HOURS AS NEEDED FOR WHEEZING OR SHORTNESS OF BREATH 10/23/22   Salena Saner, MD  albuterol (VENTOLIN HFA) 108 (90 Base) MCG/ACT inhaler USE 2 PUFFS BY MOUTH EVERY 6 HOURS AS NEEDED FOR WHEEZING 03/29/22  Ria Bush, MD  alfuzosin (UROXATRAL) 10 MG 24 hr tablet Take 10 mg by mouth daily with breakfast.    [provider]  aspirin 81 MG tablet Take 81 mg by mouth every morning.    [provider]  atorvastatin (LIPITOR) 40 MG tablet Take 1 tablet (40 mg total) by mouth daily. 11/10/22   Ria Bush, MD  Budeson-Glycopyrrol-Formoterol (BREZTRI AEROSPHERE) 160-9-4.8 MCG/ACT AERO Inhale 2 puffs into the lungs 2 (two) times daily. 03/21/22   Tyler Pita, MD  EPINEPHrine (EPIPEN 2-PAK) 0.3 mg/0.3 mL IJ SOAJ injection Inject 0.3 mg into the muscle as needed for anaphylaxis. 05/10/22   Ria Bush, MD  fexofenadine (ALLEGRA) 180 MG tablet Take 180 mg by mouth in the morning.    [provider]  gabapentin (NEURONTIN) 100 MG capsule Take 1-2 capsules (100-200 mg total) by mouth at  bedtime. 11/10/22   Ria Bush, MD  ipratropium (ATROVENT) 0.06 % nasal spray SPRAY 2 SPRAYS INTO EACH NOSTRIL 4 TIMES A DAY AS NEEDED FOR RHINITIS 05/04/22   Tyler Pita, MD  montelukast (SINGULAIR) 10 MG tablet TAKE 1 TABLET BY MOUTH EVERYDAY AT BEDTIME 11/10/22   Ria Bush, MD  Multiple Vitamin (MULTIVITAMIN) tablet Take 1 tablet by mouth in the morning.    [provider]  pantoprazole (PROTONIX) 40 MG tablet Take 1 tablet (40 mg total) by mouth 2 (two) times daily before a meal. 11/10/22   Ria Bush, MD  Polyethyl Glycol-Propyl Glycol (LUBRICANT EYE DROPS) 0.4-0.3 % SOLN Place 1-2 drops into both eyes 3 (three) times daily as needed (dry/irritated eyes.).    [provider]  predniSONE (DELTASONE) 10 MG tablet TAKE 1 TABLET (10 MG TOTAL) BY MOUTH DAILY WITH BREAKFAST. 09/18/22   Tyler Pita, MD  Roflumilast 250 MCG TABS TAKE 1 TABLET BY MOUTH EVERY DAY 06/23/22   Tyler Pita, MD  traMADol (ULTRAM) 50 MG tablet TAKE 1 TABLET BY MOUTH 2 TIMES DAILY AS NEEDED. 09/11/22   Ria Bush, MD  vitamin B-12 (CYANOCOBALAMIN) 500 MCG tablet Take 500 mcg by mouth every morning.     [provider]    Physical Exam: Vitals:   12/22/2022 1015 12/19/2022 1015 12/24/2022 1030 12/08/2022 1100  BP: 104/63  107/64 130/66  Pulse: 88  87   Resp: 18     Temp:  98 F (36.7 C)    TempSrc:  Oral    SpO2: 100%  99%   Weight:      Height:       Physical Exam Vitals and nursing note reviewed.  Constitutional:      Appearance: He is well-developed.     Comments: Appears to be in painful distress with any form of movement or respiration  HENT:     Head: Normocephalic and atraumatic.  Eyes:     Extraocular Movements: Extraocular movements intact.  Cardiovascular:     Rate and Rhythm: Normal rate and regular rhythm.  Pulmonary:     Breath sounds: Wheezing present.     Comments: Tachypnea, left lower lateral chest wall pain Abdominal:      General: Abdomen is flat. Bowel sounds are normal.     Palpations: Abdomen is rigid.  Skin:    General: Skin is warm and dry.  Neurological:     General: No focal deficit present.     Mental Status: He is alert and oriented to person, place, and time.  Psychiatric:        Mood and  Affect: Mood normal.        Behavior: Behavior normal.     Data Reviewed: Relevant notes from primary care and specialist visits, past discharge summaries as available in EHR, including Care Everywhere. Prior diagnostic testing as pertinent to current admission diagnoses Updated medications and problem lists for reconciliation ED course, including vitals, labs, imaging, treatment and response to treatment Triage notes, nursing and pharmacy notes and ED provider's notes Notable results as noted in HPI Labs reviewed.  Troponin 9, lactic acid 3.4, sodium 138, potassium 3.8, chloride 101, bicarb 25, glucose 95, BUN 12, creatinine 0.91, calcium 9.2, total protein 6.5, albumin 3.6, AST 25, ALT 24, alkaline phosphatase 58, total bilirubin 0.4, lipase 29, white count 11.1, hemoglobin 12.2, hematocrit 37.6, platelet count 204 Chest x-ray reviewed by me shows persistent right upper lobe cavitary process. Previously characterized as likely reflecting sequelae of necrotic pneumonia or aspiration. Similar opacification with scarring and architectural distortion within the left upper lobe corresponds to previous left upper lobe lung nodule and radiation change. Chronic interstitial coarsening. CT scan of abdomen and pelvis shows recent posttraumatic appearing left lower rib fractures are seen to be ribs 8 through 10 on these images, mildly displaced and with mild intercostal/chest wall hematoma. No superimposed acute or inflammatory process identified in the abdomen or pelvis. Advanced Aortic, aortoiliac Atherosclerosis. High-grade bilateral iliac artery stenosis, left superficial femoral artery stenosis, and chronically  occluded right SFA. Chronic right nephrolithiasis, and chronic 6 mm stone within the bladder, present on PET-CT last year. Severe diverticulosis of distal large bowel. No active inflammation. Lower extremity venous Dopplers negative for DVT Twelve-lead EKG reviewed by me shows sinus rhythm with abnormal R wave progression There are no new results to review at this time.  Assessment and Plan: * Pulmonary embolism on left [facility]) Patient presents for evaluation of sudden onset left lower lateral chest wall pain.  History of stage I non-small cell lung cancer involving the left upper lobestatus post SBRT CT angiogram of the chest is positive for acute pulmonary embolus at the left lingula and lower lobe pulmonary artery bifurcation. Additional lingula thrombus appears occlusive. No superimposed saddle embolus or CTA evidence of right heart strain. Continue heparin drip initiated in the ER Obtain 2D echocardiogram to assess LVEF and rule out regional wall motion abnormality Lower extremity venous Dopplers are negative for DVT  COPD, severe (HCC) Patient with a history of end-stage COPD with chronic respiratory failure on 2 to 3 L of oxygen continuous Continue scheduled and as needed bronchodilator therapy Patient is on chronic prednisone therapy which will be continued during this hospitalization Continue inhaled steroids Maintain pulse oximetry greater than 92%  Non-small cell lung cancer (HCC) Patient has a history of stage I non-small cell lung cancer of the left upper lobe and is status post SBRT Follow-up with radiation oncology as an outpatient  Lactic acidosis Most likely secondary to his underlying COPD No evidence of an infectious process at this time Monitor closely  Alcohol dependence (HCC) Patient admits to daily alcohol use Monitor closely and administer lorazepam for CIWA score of 8 or greater Place patient on MVI, thiamine and folic acid  Multiple rib fractures Status  post fall with nondisplaced left-sided ninth and 10th rib fractures Pain control Incentive spirometry   PVD (peripheral vascular disease) with claudication (HCC) Stable Continue aspirin and atorvastatin  HTN (hypertension) Blood pressure is stable Monitor closely during this hospitalization  GERD (gastroesophageal reflux disease) Continue PPI      Advance  Care Planning:   Code Status: DNR   Consults: None  Family Communication: Greater than 50% of time was spent discussing patient's condition and plan of care with him at the bedside.  All questions and concerns have been addressed.  He verbalizes understanding and agrees with the plan.  He lists his wife as his healthcare power of attorney.  CODE STATUS was discussed and he wishes to be a DNR  Severity of Illness: The appropriate patient status for this patient is INPATIENT. Inpatient status is judged to be reasonable and necessary in order to provide the required intensity of service to ensure the patient's safety. The patient's presenting symptoms, physical exam findings, and initial radiographic and laboratory data in the context of their chronic comorbidities is felt to place them at high risk for further clinical deterioration. Furthermore, it is not anticipated that the patient will be medically stable for discharge from the hospital within 2 midnights of admission.   * I certify that at the point of admission it is my clinical judgment that the patient will require inpatient hospital care spanning beyond 2 midnights from the point of admission due to high intensity of service, high risk for further deterioration and high frequency of surveillance required.*  Author: Lucile Shutters, MD 01/03/2023 11:34 AM  For on call review [email or web address].

## 2022-12-23 NOTE — Assessment & Plan Note (Addendum)
Patient's wife thinks he does not drink that much only has a couple drinks at night.  Has received some Ativan here. Monitor closely and administer lorazepam for CIWA score of 8 or greater Place patient on MVI, thiamine and folic acid

## 2022-12-23 NOTE — ED Provider Notes (Signed)
[facility] Provider Note    Event Date/Time   First MD Initiated Contact with Patient 12/22/2022 [phone]     (approximate)   History   Abdominal Pain   HPI  Parker Martinez is a 82 y.o. male whose medical history is notable for chronic respiratory failure with hypoxia and COPD on 3 L of oxygen at baseline.  He also has a history of non-small cell lung cancer.  He presents by EMS for pain in his left upper quadrant of his abdomen that is worse when he takes deep breaths.  This awoke him from sleep at about 3 AM and has been constant.  He is making grunting or growling noises.  But he is able to speak clearly and answer questions.  He is alert and oriented.  He said that the pain feels sharp and stabbing and is quite a bit worse when he takes deep breaths.  No nausea or vomiting.  No other abdominal or chest pain.  He said he always feels short of breath but he feels is a little bit worse than usual but it is because of the pain.  He denies any recent fever, nasal congestion, sore throat or other viral symptoms.     Physical Exam   Triage Vital Signs: ED Triage Vitals  Enc Vitals Group     BP 12/22/2022 0537 130/85     Pulse Rate 01/02/2023 0537 90     Resp 12/24/2022 0537 18     Temp 01/01/2023 0537 (!) 97.3 F (36.3 C)     Temp Source 12/07/2022 0537 Oral     SpO2 12/10/2022 0537 100 %     Weight 12/26/2022 0535 68 kg (150 lb)     Height 12/31/2022 0535 1.715 m (5' 7.5")     Head Circumference --      Peak Flow --      Pain Score 12/14/2022 0536 10     Pain Loc --      Pain Edu? --      Excl. in GC? --     Most recent vital signs: Vitals:   01/03/2023 0633 12/04/2022 0700  BP: 116/82 118/76  Pulse: 93 81  Resp: 19 19  Temp:    SpO2: 99% 100%     General: Awake, appears uncomfortable and is making growling sounds when he breathes. CV:  Good peripheral perfusion.  Regular rate and rhythm.  Normal heart sounds. Resp:  Grunting respirations but due to pain.  No  accessory muscle usage.  Some mild coarse breath sounds but no wheezing.  Breath sounds are equal bilaterally. Abd:  Patient has tenderness to palpation of the left upper quadrant right under the ribs but also tenderness to the left lower anterior ribs.  No palpable deformities.  No tenderness to palpation of the lower abdomen, epigastrium, nor right upper quadrant.   ED Results / Procedures / Treatments   Labs (all labs ordered are listed, but only abnormal results are displayed) Labs Reviewed  LACTIC ACID, PLASMA - Abnormal; Notable for the following components:      Result Value   Lactic Acid, Venous 3.4 (*)    All other components within normal limits  CBC WITH DIFFERENTIAL/PLATELET - Abnormal; Notable for the following components:   WBC 11.1 (*)    RBC 3.88 (*)    Hemoglobin 12.2 (*)    HCT 37.6 (*)    Abs Immature Granulocytes 0.15 (*)    All other components within  normal limits  COMPREHENSIVE METABOLIC PANEL  LIPASE, BLOOD  LACTIC ACID, PLASMA  TROPONIN I (HIGH SENSITIVITY)  TROPONIN I (HIGH SENSITIVITY)     EKG  ED ECG REPORT I, Loleta Rose, the attending physician, personally viewed and interpreted this ECG.  Date: 12/20/2022 EKG Time: 5:38 AM Rate: 87 Rhythm: normal sinus rhythm QRS Axis: normal Intervals: normal ST/T Wave abnormalities: Non-specific ST segment / T-wave changes, but no clear evidence of acute ischemia. Narrative Interpretation: no definitive evidence of acute ischemia; does not meet STEMI criteria.    RADIOLOGY I viewed and interpreted the patient's 1 view chest x-ray.  See hospital course for details, but in short, no pneumothorax, extensive chronic changes.  CTA chest to rule out PE and CT abdomen/pelvis ordered at the time of transfer of care.    PROCEDURES:  Critical Care performed: No  Procedures   MEDICATIONS ORDERED IN ED: Medications  morphine (PF) 4 MG/ML injection 4 mg (4 mg Intravenous Given 12/07/2022 0554)   ondansetron (ZOFRAN) injection 4 mg (4 mg Intravenous Given 12/30/2022 0554)  lactated ringers bolus 1,000 mL (0 mLs Intravenous Stopped 12/09/2022 0718)  morphine (PF) 4 MG/ML injection 4 mg (4 mg Intravenous Given 12/06/2022 3015)  ketorolac (TORADOL) 30 MG/ML injection 15 mg (15 mg Intravenous Given 12/25/2022 0633)  iohexol (OMNIPAQUE) 350 MG/ML injection 100 mL (100 mLs Intravenous Contrast Given 12/16/2022 0726)     IMPRESSION / MDM / ASSESSMENT AND PLAN / ED COURSE  I reviewed the triage vital signs and the nursing notes.                              Differential diagnosis includes, but is not limited to, pneumothorax, pneumonia, PE, ACS, splenic infarction, acute bleed  Patient's presentation is most consistent with acute presentation with potential threat to life or bodily function.  Lab/studies ordered: CMP, CBC with differential, high-sensitivity troponin, lactic acid, lipase, 1 view chest x-ray, EKG.  The patient is on the cardiac monitor to evaluate for evidence of arrhythmia and/or significant heart rate changes.  Interventions ordered: Morphine 4 mg IV, Zofran 4 mg IV.  Will also order Toradol 15 mg IV.  My primary concern is to rule out pneumothorax but I think it is unlikely.  Although the patient is complaining of abdominal pain that is also rating up into his chest I think it is more likely that this is (if not a pneumothorax) pneumonia or a PE.  I anticipate CTA chest to rule out PE and to evaluate acute issues of his lung parenchyma (infection) once I have verified his renal status.   Clinical Course as of 12/17/2022 0758  Sat Dec 23, 2022  6349 Lactic Acid, Venous(!!): 3.4 The patient is noted to have a lactate>4. With the current information available to me, I don't think the patient is in septic shock. The lactate>3, is related to respiratory distress/ respiratory failure.  Will hold off on empiric antibiotics for now, but I ordered 1L LR IV bolus for possible insensible  losses  [CF]  0628 In addition to the prior doses, I also ordered another 4 mg of morphine to help the patient be more comfortable while going to CT scan.  His metabolic panel is reassuring and I ordered a CTA chest [CF]  0630 DG Chest Portable 1 View I viewed and interpreted the patient's 1 view chest x-ray.  There are extensive chronic changes but no pneumothorax.  The radiologist said  that there are right upper lobe and some left upper lobe areas that likely represent necrotic pneumonia.  Nothing is mentioned about lower lobe and I do not see any specific abnormality to account for the patient's acute symptoms..  Proceeding with CTA chest [CF]  [phone] Given that the patient is having abdominal pain as well, I will scan the CTA chest but also continue with CT abdomen/pelvis with IV contrast. [CF]  0730 Transferring ED care to Dr. Vicente Males to follow up and reassess patient. [CF]    Clinical Course User Index [CF] Loleta Rose, MD     FINAL CLINICAL IMPRESSION(S) / ED DIAGNOSES   Final diagnoses:  LUQ abdominal pain  Pleuritic chest pain  Chronic respiratory failure, unspecified whether with hypoxia or hypercapnia (HCC)  Chronic obstructive pulmonary disease, unspecified COPD type (HCC)     Rx / DC Orders   ED Discharge Orders     None        Note:  This document was prepared using Dragon voice recognition software and may include unintentional dictation errors.   Loleta Rose, MD 12/30/2022 [phone]

## 2022-12-23 NOTE — Assessment & Plan Note (Addendum)
Patient with a history of end-stage COPD with chronic respiratory failure on 2 to 3 L of oxygen continuous and chronic prednisone.  Confirmed DNR status.  Patient on BiPAP now.  Taper steroids.

## 2022-12-23 NOTE — Assessment & Plan Note (Addendum)
Continue Eliquis if able to take if unable to take may have to switch over to Lovenox injections.  Lower extremity Doppler negative.  Echocardiogram shows an EF that is normal without strain.

## 2022-12-23 NOTE — Consult Note (Addendum)
ANTICOAGULATION CONSULT NOTE  Pharmacy Consult for IV Heparin Indication: pulmonary embolus  Patient Measurements: Height: 5' 7.5" (171.5 cm) Weight: 68 kg (150 lb) IBW/kg (Calculated) : 67.25 Heparin Dosing Weight: 68 kg  Labs: Recent Labs    12/08/2022 0538 01/01/2023 0751 01/02/2023 0843 01/01/2023 1837  HGB 12.2*  --   --   --   HCT 37.6*  --   --   --   PLT 204  --   --   --   APTT  --   --  24  --   LABPROT  --   --  12.6  --   INR  --   --  1.0  --   HEPARINUNFRC  --   --   --  0.55  CREATININE 0.91  --   --   --   TROPONINIHS 10 9  --   --      Estimated Creatinine Clearance: 59.6 mL/min (by C-G formula based on SCr of 0.91 mg/dL).   Medical History: Past Medical History:  Diagnosis Date   Abnormal drug screen 10/2013, 03/2015   low Cr, +EtOH, neg hydrocodone (10/2013, again 03/2015)   Allergic rhinitis    Arthritis    lumbar and cervical spine   Carotid stenosis 09/11/2014   40-59% bilat ICA stenosis, rpt 1 yr (09/2014)    COPD (chronic obstructive pulmonary disease) (HCC)    severe, participated in [facility] study   DDD (degenerative disc disease)    lumbar (HNP R L3/4) s/p ESI, cervical   Diverticulitis 2015   by CT   Diverticulosis 2008   severe by colonoscopy and CT   Duodenitis    Esophageal stricture 2012   s/p dilation   GERD (gastroesophageal reflux disease)    Hemangioma    congenital left arm   History of kidney stones remote   HTN (hypertension)    borderline readings at home   Idiopathic anaphylaxis    h/o recurrent anaphylaxis, unknown trigger, s/p eval at [facility] [phone]), last episode 03/2011   PAD (peripheral artery disease) (HCC)    Personal history of colonic adenomas 09/02/2013   Prostate cancer (HCC) 2011   prostate seed implant (Dr. Earlene Plater)   Smoker    Status post dilatation of esophageal stricture    Subclavian steal syndrome 10/12/2016   Left subclavian steal, with 18 mmHg brachial artery pressure gradient (10/2016).  If symptomatic, rec PV consult   Medications:  No anticoagulation prior to admission per my chart review  Assessment: Patient is an 82 y/o M with medical history as above and including COPD on 3L O2 at baseline, lung cancer who presented to the ED 1/20 with abdominal pain. Found to have acute PE. Pharmacy consulted to initiate and manage heparin infusion for PE.  Baseline aPTT and PT-INR are pending. Baseline CBC overall normal w/ mild anemia.  Goal of Therapy:  Heparin level 0.3-0.7 units/ml Monitor platelets by anticoagulation protocol: Yes   Plan: heparin level therapeutic x 1 --Continue heparin at 1150 units/hr --HL 8 hours from last level --Daily CBC per protocol while on IV heparin  Jaynie Bream 12/12/2022,7:10 PM

## 2022-12-23 NOTE — Consult Note (Signed)
ANTICOAGULATION CONSULT NOTE  Pharmacy Consult for IV Heparin Indication: pulmonary embolus  Patient Measurements: Height: 5' 7.5" (171.5 cm) Weight: 68 kg (150 lb) IBW/kg (Calculated) : 67.25 Heparin Dosing Weight: 68 kg  Labs: Recent Labs    12/14/2022 0538 12/16/2022 0751  HGB 12.2*  --   HCT 37.6*  --   PLT 204  --   CREATININE 0.91  --   TROPONINIHS 10 9    Estimated Creatinine Clearance: 59.6 mL/min (by C-G formula based on SCr of 0.91 mg/dL).   Medical History: Past Medical History:  Diagnosis Date   Abnormal drug screen 10/2013, 03/2015   low Cr, +EtOH, neg hydrocodone (10/2013, again 03/2015)   Allergic rhinitis    Arthritis    lumbar and cervical spine   Carotid stenosis 09/11/2014   40-59% bilat ICA stenosis, rpt 1 yr (09/2014)    COPD (chronic obstructive pulmonary disease) (HCC)    severe, participated in [facility] study   DDD (degenerative disc disease)    lumbar (HNP R L3/4) s/p ESI, cervical   Diverticulitis 2015   by CT   Diverticulosis 2008   severe by colonoscopy and CT   Duodenitis    Esophageal stricture 2012   s/p dilation   GERD (gastroesophageal reflux disease)    Hemangioma    congenital left arm   History of kidney stones remote   HTN (hypertension)    borderline readings at home   Idiopathic anaphylaxis    h/o recurrent anaphylaxis, unknown trigger, s/p eval at [facility] [phone]), last episode 03/2011   PAD (peripheral artery disease) (HCC)    Personal history of colonic adenomas 09/02/2013   Prostate cancer (HCC) 2011   prostate seed implant (Dr. Earlene Plater)   Smoker    Status post dilatation of esophageal stricture    Subclavian steal syndrome 10/12/2016   Left subclavian steal, with 18 mmHg brachial artery pressure gradient (10/2016). If symptomatic, rec PV consult   Medications:  No anticoagulation prior to admission per my chart review  Assessment: Patient is an 82 y/o M with medical history as above and including COPD  on 3L O2 at baseline, lung cancer who presented to the ED 1/20 with abdominal pain. Found to have acute PE. Pharmacy consulted to initiate and manage heparin infusion for PE.  Baseline aPTT and PT-INR are pending. Baseline CBC overall normal w/ mild anemia.  Goal of Therapy:  Heparin level 0.3-0.7 units/ml Monitor platelets by anticoagulation protocol: Yes   Plan:  --Heparin 4000 unit IV bolus followed by continuous infusion at 1150 units/hr --HL 8 hours from initiation of infusion --Daily CBC per protocol while on IV heparin  Tressie Ellis 01/01/2023,8:29 AM

## 2022-12-23 NOTE — ED Triage Notes (Signed)
Pt presents via [facility] with LUQ abdominal pain - worse with palpation and deep breaths that started ~45 mins ago. Hx of COPD - 3L at baseline. Pt visibly uncomfortable but able to make complete sentences. Denies falls, injury, trauma, thinners, CP or SOB.

## 2022-12-24 ENCOUNTER — Inpatient Hospital Stay (HOSPITAL_COMMUNITY)
Admit: 2022-12-24 | Discharge: 2022-12-24 | Disposition: A | Discharge status: Home / Self Care | Payer: PPO | Attending: Internal Medicine | Admitting: Internal Medicine

## 2022-12-24 DIAGNOSIS — K219 Gastro-esophageal reflux disease without esophagitis: Secondary | ICD-10-CM

## 2022-12-24 DIAGNOSIS — I10 Essential (primary) hypertension: Secondary | ICD-10-CM

## 2022-12-24 DIAGNOSIS — S2242XS Multiple fractures of ribs, left side, sequela: Secondary | ICD-10-CM | POA: Diagnosis not present

## 2022-12-24 DIAGNOSIS — J9621 Acute and chronic respiratory failure with hypoxia: Secondary | ICD-10-CM

## 2022-12-24 DIAGNOSIS — I2699 Other pulmonary embolism without acute cor pulmonale: Secondary | ICD-10-CM

## 2022-12-24 DIAGNOSIS — C3492 Malignant neoplasm of unspecified part of left bronchus or lung: Secondary | ICD-10-CM

## 2022-12-24 DIAGNOSIS — F1020 Alcohol dependence, uncomplicated: Secondary | ICD-10-CM

## 2022-12-24 DIAGNOSIS — I739 Peripheral vascular disease, unspecified: Secondary | ICD-10-CM

## 2022-12-24 DIAGNOSIS — J449 Chronic obstructive pulmonary disease, unspecified: Secondary | ICD-10-CM

## 2022-12-24 DIAGNOSIS — E872 Acidosis, unspecified: Secondary | ICD-10-CM

## 2022-12-24 LAB — BASIC METABOLIC PANEL WITH GFR
Anion gap: 8 (ref 5–15)
BUN: 22 mg/dL (ref 8–23)
CO2: 27 mmol/L (ref 22–32)
Calcium: 8.8 mg/dL — ABNORMAL LOW (ref 8.9–10.3)
Chloride: 101 mmol/L (ref 98–111)
Creatinine, Ser: 0.96 mg/dL (ref 0.61–1.24)
GFR, Estimated: >60 mL/min
Glucose, Bld: 60 mg/dL — ABNORMAL LOW (ref 70–99)
Potassium: 4.7 mmol/L (ref 3.5–5.1)
Sodium: 136 mmol/L (ref 135–145)

## 2022-12-24 LAB — ECHOCARDIOGRAM COMPLETE
AR max vel: 2.9 cm2
AV Area VTI: 3.29 cm2
AV Area mean vel: 2.95 cm2
AV Mean grad: 3 mmHg
AV Peak grad: 4.2 mmHg
Ao pk vel: 1.03 m/s
Area-P 1/2: 6.96 cm2
Calc EF: 66.8 %
Height: 67.5 in
S' Lateral: 3 cm
Single Plane A2C EF: 70 %
Single Plane A4C EF: 64.1 %
Weight: 2400 [oz_av]

## 2022-12-24 LAB — CBC
HCT: 35.8 % — ABNORMAL LOW (ref 39.0–52.0)
Hemoglobin: 11.5 g/dL — ABNORMAL LOW (ref 13.0–17.0)
MCH: 32 pg (ref 26.0–34.0)
MCHC: 32.1 g/dL (ref 30.0–36.0)
MCV: 99.7 fL (ref 80.0–100.0)
Platelets: 183 10*3/uL (ref 150–400)
RBC: 3.59 MIL/uL — ABNORMAL LOW (ref 4.22–5.81)
RDW: 13.5 % (ref 11.5–15.5)
WBC: 10.7 10*3/uL — ABNORMAL HIGH (ref 4.0–10.5)
nRBC: 0 % (ref 0.0–0.2)

## 2022-12-24 LAB — HEPARIN LEVEL (UNFRACTIONATED): Heparin Unfractionated: 0.5 [IU]/mL (ref 0.30–0.70)

## 2022-12-24 MED ORDER — HYDROCORTISONE SOD SUC (PF) 100 MG IJ SOLR
50.0000 mg | Freq: Three times a day (TID) | INTRAMUSCULAR | Status: DC
  Administered 2022-12-24 – 2022-12-26 (×5): 50 mg via INTRAVENOUS
  Filled 2022-12-24 (×7): qty 1

## 2022-12-24 MED ORDER — SODIUM CHLORIDE 0.9 % IV BOLUS
500.0000 mL | Freq: Once | INTRAVENOUS | Status: AC
  Administered 2022-12-24: 500 mL via INTRAVENOUS

## 2022-12-24 MED ORDER — MORPHINE SULFATE (PF) 2 MG/ML IV SOLN
1.0000 mg | INTRAVENOUS | Status: DC | PRN
  Administered 2022-12-24 – 2022-12-26 (×4): 1 mg via INTRAVENOUS
  Filled 2022-12-24 (×4): qty 1

## 2022-12-24 MED ORDER — HYDROCODONE-ACETAMINOPHEN 5-325 MG PO TABS
1.0000 | ORAL_TABLET | ORAL | Status: DC | PRN
  Administered 2022-12-24 – 2022-12-25 (×4): 1 via ORAL
  Filled 2022-12-24 (×4): qty 1

## 2022-12-24 MED ORDER — APIXABAN 5 MG PO TABS
10.0000 mg | ORAL_TABLET | Freq: Two times a day (BID) | ORAL | Status: DC
  Administered 2022-12-24 – 2022-12-25 (×3): 10 mg via ORAL
  Filled 2022-12-24 (×4): qty 2

## 2022-12-24 MED ORDER — MORPHINE SULFATE (PF) 2 MG/ML IV SOLN
2.0000 mg | INTRAVENOUS | Status: AC
  Administered 2022-12-24: 2 mg via INTRAVENOUS
  Filled 2022-12-24: qty 1

## 2022-12-24 MED ORDER — MORPHINE SULFATE (PF) 2 MG/ML IV SOLN: 2.0000 mg | Freq: Once | INTRAVENOUS | Status: DC

## 2022-12-24 MED ORDER — PERFLUTREN LIPID MICROSPHERE
1.0000 mL | INTRAVENOUS | Status: AC | PRN
  Administered 2022-12-24: 3 mL via INTRAVENOUS

## 2022-12-24 MED ORDER — APIXABAN 5 MG PO TABS: 5.0000 mg | ORAL_TABLET | Freq: Two times a day (BID) | ORAL | Status: DC

## 2022-12-24 MED ORDER — MIDODRINE HCL 5 MG PO TABS
10.0000 mg | ORAL_TABLET | Freq: Three times a day (TID) | ORAL | Status: DC
  Administered 2022-12-24 – 2022-12-25 (×2): 10 mg via ORAL
  Filled 2022-12-24 (×2): qty 2

## 2022-12-24 MED ORDER — SODIUM CHLORIDE 0.9 % IV SOLN: INTRAVENOUS | Status: DC

## 2022-12-24 MED ORDER — HYDROCORTISONE SOD SUC (PF) 100 MG IJ SOLR
100.0000 mg | Freq: Once | INTRAMUSCULAR | Status: AC
  Administered 2022-12-24: 100 mg via INTRAVENOUS
  Filled 2022-12-24: qty 2

## 2022-12-24 NOTE — Consult Note (Signed)
ANTICOAGULATION CONSULT NOTE  Pharmacy Consult for IV Heparin Indication: pulmonary embolus  Patient Measurements: Height: 5' 7.5" (171.5 cm) Weight: 68 kg (150 lb) IBW/kg (Calculated) : 67.25 Heparin Dosing Weight: 68 kg  Labs: Recent Labs    12/10/2022 0538 12/26/2022 0751 12/22/2022 0843 12/11/2022 1837 12/24/22 0249  HGB 12.2*  --   --   --  11.5*  HCT 37.6*  --   --   --  35.8*  PLT 204  --   --   --  183  APTT  --   --  24  --   --   LABPROT  --   --  12.6  --   --   INR  --   --  1.0  --   --   HEPARINUNFRC  --   --   --  0.55 0.50  CREATININE 0.91  --   --   --  0.96  TROPONINIHS 10 9  --   --   --      Estimated Creatinine Clearance: 56.5 mL/min (by C-G formula based on SCr of 0.96 mg/dL).   Medical History: Past Medical History:  Diagnosis Date   Abnormal drug screen 10/2013, 03/2015   low Cr, +EtOH, neg hydrocodone (10/2013, again 03/2015)   Allergic rhinitis    Arthritis    lumbar and cervical spine   Carotid stenosis 09/11/2014   40-59% bilat ICA stenosis, rpt 1 yr (09/2014)    COPD (chronic obstructive pulmonary disease) (HCC)    severe, participated in [facility] study   DDD (degenerative disc disease)    lumbar (HNP R L3/4) s/p ESI, cervical   Diverticulitis 2015   by CT   Diverticulosis 2008   severe by colonoscopy and CT   Duodenitis    Esophageal stricture 2012   s/p dilation   GERD (gastroesophageal reflux disease)    Hemangioma    congenital left arm   History of kidney stones remote   HTN (hypertension)    borderline readings at home   Idiopathic anaphylaxis    h/o recurrent anaphylaxis, unknown trigger, s/p eval at [facility] [phone]), last episode 03/2011   PAD (peripheral artery disease) (HCC)    Personal history of colonic adenomas 09/02/2013   Prostate cancer (HCC) 2011   prostate seed implant (Dr. Earlene Plater)   Smoker    Status post dilatation of esophageal stricture    Subclavian steal syndrome 10/12/2016   Left subclavian  steal, with 18 mmHg brachial artery pressure gradient (10/2016). If symptomatic, rec PV consult   Medications:  No anticoagulation prior to admission per my chart review  Assessment: Patient is an 82 y/o M with medical history as above and including COPD on 3L O2 at baseline, lung cancer who presented to the ED 1/20 with abdominal pain. Found to have acute PE. Pharmacy consulted to initiate and manage heparin infusion for PE.  Baseline aPTT and PT-INR are pending. Baseline CBC overall normal w/ mild anemia.  Goal of Therapy:  Heparin level 0.3-0.7 units/ml Monitor platelets by anticoagulation protocol: Yes   Plan:  1/21:  HL @ 0249 = 0.50, therapeutic X 2  Will continue pt on current rate and recheck HL on 1/22 with AM labs.  --Daily CBC per protocol while on IV heparin  Siarah Deleo D 12/24/2022,3:37 AM

## 2022-12-24 NOTE — Progress Notes (Signed)
OT Cancellation Note  Patient Details Name: Parker Martinez MRN: [id number] DOB: Sep 23, 1941   Cancelled Treatment:    Reason Eval/Treat Not Completed: Medical issues which prohibited therapy. OT orders received, chart reviewed. Pt with acute PE. Per therapy protocol, OT evaluation will be completed after 48 hours of anticoagulation. Pt started on heparin at 0850 on 12/19/2022. Will re-attempt evaluation as able and as pt medically appropriate.    Gerrie Nordmann 12/24/2022, 8:25 AM

## 2022-12-24 NOTE — ED Notes (Addendum)
This RN to bedside. Pt sitting on side of bed moaning in pain. Pt desatting to 80% on 4L [place]. Pt placed on 6L [place] and breathing tx, Dr Renae Gloss to bedside at this time. Pt assisted back into bed, pt then placed on HFNC at 6L and tolerating well. RT notified and Dr Renae Gloss agreeable with HFNC. Awaiting orders at this time. WCTM.

## 2022-12-24 NOTE — ED Notes (Signed)
Pt to side of bed to use urinal, desatting to 80%. Oxygen turned to 10L HFNC to rebound. Oxygen level slowly increasing.

## 2022-12-24 NOTE — Progress Notes (Signed)
Progress Note   Patient: Parker Martinez ZGR:[id number] DOB: [birth year] DOA: 12/16/2022     1 DOS: the patient was seen and examined on 12/24/2022   Brief hospital course: 82 y.o. male with medical history significant for diverticulosis, COPD with chronic respiratory failure on 2 L of oxygen, peripheral arterial disease, history of stage I none small cell lung cancer status post SBRT, hypertension, GERD, alcohol dependence, degenerative disc disease who presents to the ER via EMS for evaluation of left lower lateral chest wall pain that he said woke him up out of his sleep.  He describes the pain as a sharp continuous pain rated an 8 x 10 in intensity at its worst with no relieving factors.  Pain is worse with inspiration or any form of movement.  Chest pain is associated with shortness of breath but he denies having any cough, no fever, no chills, no nausea, no vomiting, no headache, no dizziness, no lightheadedness, no urinary symptoms, no changes in his bowel habits, no focal deficit or blurred vision.  He denied any recent falls or trauma but according to his wife he fell about a month ago. CT angiogram of the chest is positive for acute pulmonary embolus at the left lingula and lower lobe pulmonary artery bifurcation. Additional lingula thrombus appears occlusive. No superimposed saddle embolus or CTA evidence of right heart strain. No evidence of pulmonary infarct at this time. No pleural effusion. Positive also for nondisplaced fractures of the left lateral 9th and 10th ribs, new since November and appear to be posttraumatic. Underlying advanced chronic lung disease, Emphysema treated left upper lobe stable since November restaging CT. indeterminate, spiculated left lower lobe pulmonary nodules (series 3, images 73-87) were previously thought to be inflammatory but persist. Although no growth since November. Continued cancer surveillance recommended. regressed but not resolved right upper lobe part  solid, part cavitary lesion. If this has not been specifically treated then regression favors benign etiology. Associated generalized thickening of the right lung airways extending to the hilum appears stable. Patient has been started on a heparin drip and will be admitted to the hospital for further evaluation.  1/20.  Patient needed to be placed on high flow nasal cannula in order to oxygenate.  Patient in a lot of pain started IV pain medication along with oral pain medication.  Heparin drip switched over to Eliquis.  Assessment and Plan: * Multiple rib fractures Patient doing quite a bit of pain with 3 rib fractures.  Incentive spirometry.  High flow nasal cannula and taper oxygen as able.  IV and oral pain medications.   Acute on chronic respiratory failure with hypoxia (HCC) Patient chronically on 3 L of oxygen.  Needed to go up to 6 L and then we switched over to high flow nasal cannula.  Pulse ox dropped down into the 80s on his baseline 3 L.  Pulmonary embolism on left (HCC) Heparin drip switched over to Eliquis.  Lower extremity Doppler negative.  Echocardiogram pending.  COPD, severe (HCC) Patient with a history of end-stage COPD with chronic respiratory failure on 2 to 3 L of oxygen continuous and chronic prednisone.  Increasing oxygen requirements to high flow nasal cannula 6 L.  Non-small cell lung cancer [facility]) Patient has a history of stage I non-small cell lung cancer of the left upper lobe and is status post SBRT Follow-up as outpatient.  Lactic acidosis Most likely secondary to his underlying COPD No evidence of an infectious process at this time Monitor closely  Alcohol dependence (HCC) Patient admits to daily alcohol use Monitor closely and administer lorazepam for CIWA score of 8 or greater Place patient on MVI, thiamine and folic acid  PVD (peripheral vascular disease) with claudication (HCC) Continue atorvastatin  HTN (hypertension) Blood pressure is  stable Monitor closely during this hospitalization  GERD (gastroesophageal reflux disease) Continue PPI        Subjective: Patient moaning in a lot of pain.  Found to have 3 broken ribs on imaging.  Also found to have a pulmonary embolism.  Patient desatted down into the 80s on his normal oxygen and needed to be turned up to 6 L and then placed on high flow nasal cannula after that.  Physical Exam: Vitals:   12/24/22 0911 12/24/22 1006 12/24/22 1100 12/24/22 1215  BP:   (!) 106/56   Pulse:  95 84   Resp:   17   Temp: 98.1 F (36.7 C)     TempSrc: Oral     SpO2:   98% 93%  Weight:      Height:       Physical Exam HENT:     Head: Normocephalic.     Mouth/Throat:     Pharynx: No oropharyngeal exudate.  Eyes:     General: Lids are normal.     Conjunctiva/sclera: Conjunctivae normal.  Cardiovascular:     Rate and Rhythm: Normal rate and regular rhythm.     Heart sounds: Normal heart sounds, S1 normal and S2 normal.  Pulmonary:     Effort: Accessory muscle usage present.     Breath sounds: Examination of the right-lower field reveals decreased breath sounds. Examination of the left-lower field reveals decreased breath sounds. Decreased breath sounds present. No wheezing or rhonchi.  Abdominal:     Palpations: Abdomen is soft.     Tenderness: There is no abdominal tenderness.  Musculoskeletal:     Right lower leg: No swelling.     Left lower leg: No swelling.  Skin:    General: Skin is warm.     Findings: No rash.  Neurological:     Mental Status: He is alert and oriented to person, place, and time.     Data Reviewed: White blood cell count 10.7, hemoglobin 11.5, platelet count 183, creatinine 0.96 CT scan showing positive for acute pulmonary embolism left lingula and left lower lobe pulmonary artery bifurcation, underlying advanced chronic lung disease and treated left upper lobe cancer, spiculated left lower lobe pulmonary nodules.  Regressed cavitary lesion right  upper lobe.  Fractures ribs 8 through 10 on the left  Family Communication: Updated wife on the phone  Disposition: Status is: Inpatient Remains inpatient appropriate because: Patient now on high flow nasal cannula, pain control for fractured ribs with IV and oral pain medication  Planned Discharge Destination: To be determined    Time spent: 29 minutes  Author: Alford Highland, MD 12/24/2022 12:51 PM  For on call review [email or web address].

## 2022-12-24 NOTE — ED Notes (Signed)
Pt titrated to 4L humidified oxygen and tolerating well. WCTM.

## 2022-12-24 NOTE — Progress Notes (Signed)
PT Cancellation Note  Patient Details Name: Parker Martinez MRN: [id number] DOB: July 05, 1941   Cancelled Treatment:    Reason Eval/Treat Not Completed: Medical issues which prohibited therapy: PT orders received, chart reviewed. Pt with acute PE. Per therapy protocol, PT evaluation will be completed after 48 hours of anticoagulation. Pt started on heparin at 0850 on 12/22/2022. Will re-attempt evaluation as able and as pt medically appropriate.     Ovidio Hanger PT, DPT 12/24/22, 8:49 AM

## 2022-12-24 NOTE — ED Notes (Signed)
Dr Renae Gloss made aware of BP trend at this time, WCTM.

## 2022-12-24 NOTE — Assessment & Plan Note (Addendum)
Patient chronically on 3 L of oxygen.  Placed on BiPAP on 1/22 secondary to hypoxia on high flow nasal cannula, on 35% FiO2.  Pulse ox 1/22 in the 80s on high flow nasal cannula.  ABG ordered.

## 2022-12-24 NOTE — Consult Note (Signed)
ANTICOAGULATION CONSULT NOTE  Pharmacy Consult for IV Heparin >> Apixaban Indication: pulmonary embolus  Patient Measurements: Height: 5' 7.5" (171.5 cm) Weight: 68 kg (150 lb) IBW/kg (Calculated) : 67.25 Heparin Dosing Weight: 68 kg  Labs: Recent Labs    12/13/2022 0538 12/31/2022 0751 12/08/2022 0843 12/22/2022 1837 12/24/22 0249  HGB 12.2*  --   --   --  11.5*  HCT 37.6*  --   --   --  35.8*  PLT 204  --   --   --  183  APTT  --   --  24  --   --   LABPROT  --   --  12.6  --   --   INR  --   --  1.0  --   --   HEPARINUNFRC  --   --   --  0.55 0.50  CREATININE 0.91  --   --   --  0.96  TROPONINIHS 10 9  --   --   --      Estimated Creatinine Clearance: 56.5 mL/min (by C-G formula based on SCr of 0.96 mg/dL).   Medical History: Past Medical History:  Diagnosis Date   Abnormal drug screen 10/2013, 03/2015   low Cr, +EtOH, neg hydrocodone (10/2013, again 03/2015)   Allergic rhinitis    Arthritis    lumbar and cervical spine   Carotid stenosis 09/11/2014   40-59% bilat ICA stenosis, rpt 1 yr (09/2014)    COPD (chronic obstructive pulmonary disease) (HCC)    severe, participated in [facility] study   DDD (degenerative disc disease)    lumbar (HNP R L3/4) s/p ESI, cervical   Diverticulitis 2015   by CT   Diverticulosis 2008   severe by colonoscopy and CT   Duodenitis    Esophageal stricture 2012   s/p dilation   GERD (gastroesophageal reflux disease)    Hemangioma    congenital left arm   History of kidney stones remote   HTN (hypertension)    borderline readings at home   Idiopathic anaphylaxis    h/o recurrent anaphylaxis, unknown trigger, s/p eval at [facility] [phone]), last episode 03/2011   PAD (peripheral artery disease) (HCC)    Personal history of colonic adenomas 09/02/2013   Prostate cancer (HCC) 2011   prostate seed implant (Dr. Earlene Plater)   Smoker    Status post dilatation of esophageal stricture    Subclavian steal syndrome 10/12/2016   Left  subclavian steal, with 18 mmHg brachial artery pressure gradient (10/2016). If symptomatic, rec PV consult   Medications:  No anticoagulation prior to admission per my chart review  Assessment: Patient is an 82 y/o M with medical history as above and including COPD on 3L O2 at baseline, lung cancer who presented to the ED 1/20 with abdominal pain. Found to have acute PE. Pharmacy consulted to transition IV heparin to apixaban for treatment of acute PE.  Baseline aPTT and PT-INR are pending. Baseline CBC overall normal w/ mild anemia.  Goal of Therapy:  Heparin level 0.3-0.7 units/ml Monitor platelets by anticoagulation protocol: Yes   Plan:  --Stop IV heparin 1/21 at 0959 --At 1000, start apixaban 10 mg BID x 7 days followed by apixaban 5 mg BID for remaining duration of therapy --CBC per protocol  Parker Martinez 12/24/2022,7:44 AM

## 2022-12-24 NOTE — Hospital Course (Addendum)
82 y.o. male with medical history significant for diverticulosis, COPD with chronic respiratory failure on 2 L of oxygen, peripheral arterial disease, history of stage I none small cell lung cancer status post SBRT, hypertension, GERD, alcohol dependence, degenerative disc disease who presents to the ER via EMS for evaluation of left lower lateral chest wall pain that he said woke him up out of his sleep.  He describes the pain as a sharp continuous pain rated an 8 x 10 in intensity at its worst with no relieving factors.  Pain is worse with inspiration or any form of movement.  Chest pain is associated with shortness of breath but he denies having any cough, no fever, no chills, no nausea, no vomiting, no headache, no dizziness, no lightheadedness, no urinary symptoms, no changes in his bowel habits, no focal deficit or blurred vision.  He denied any recent falls or trauma but according to his wife he fell about a month ago. CT angiogram of the chest is positive for acute pulmonary embolus at the left lingula and lower lobe pulmonary artery bifurcation. Additional lingula thrombus appears occlusive. No superimposed saddle embolus or CTA evidence of right heart strain. No evidence of pulmonary infarct at this time. No pleural effusion. Positive also for nondisplaced fractures of the left lateral 9th and 10th ribs, new since November and appear to be posttraumatic. Underlying advanced chronic lung disease, Emphysema treated left upper lobe stable since November restaging CT. indeterminate, spiculated left lower lobe pulmonary nodules (series 3, images 73-87) were previously thought to be inflammatory but persist. Although no growth since November. Continued cancer surveillance recommended. regressed but not resolved right upper lobe part solid, part cavitary lesion. If this has not been specifically treated then regression favors benign etiology. Associated generalized thickening of the right lung airways  extending to the hilum appears stable. Patient has been started on a heparin drip and will be admitted to the hospital for further evaluation.  Per Dr. Renae Gloss: "1/21.  Patient needed to be placed on high flow nasal cannula in order to oxygenate.  Patient in a lot of pain started IV pain medication along with oral pain medication.  Heparin drip switched over to Eliquis. 1/22.  Patient still in a lot of pain.  Bruising over his left flank.  Increasing oxygen requirements because he is taking shallow breaths needed to be placed on BiPAP 50%.  Confirmed patient is a DNR. 1/23.  Patient more lethargic but seen after pain medications.  Patient on BiPAP."  December 30, 2022. Overnight pt ripped off BiPAP mask and was therefore placed on heated HFNC oxygen.  Rapid response called around 4 AM for central cyanosis, diffuse rhonchorous breath sounds with increased work of breathing and pt obtunded. CXR showed left-sided hydrothorax.  ABG with worsening respiratory acidosis and hypercapnia.  Placed back on BiPAP.  PCCM was consulted for chest tube placement.   Goals of care discussions were had and family decided on comfort care measures this morning. Patient subsequently passed away at 0958 this morning.

## 2022-12-25 ENCOUNTER — Other Ambulatory Visit (HOSPITAL_COMMUNITY): Payer: Self-pay

## 2022-12-25 DIAGNOSIS — I951 Orthostatic hypotension: Secondary | ICD-10-CM

## 2022-12-25 DIAGNOSIS — J449 Chronic obstructive pulmonary disease, unspecified: Secondary | ICD-10-CM | POA: Diagnosis not present

## 2022-12-25 DIAGNOSIS — I2699 Other pulmonary embolism without acute cor pulmonale: Secondary | ICD-10-CM | POA: Diagnosis not present

## 2022-12-25 DIAGNOSIS — I959 Hypotension, unspecified: Secondary | ICD-10-CM | POA: Insufficient documentation

## 2022-12-25 DIAGNOSIS — J9621 Acute and chronic respiratory failure with hypoxia: Secondary | ICD-10-CM | POA: Diagnosis not present

## 2022-12-25 DIAGNOSIS — S2242XS Multiple fractures of ribs, left side, sequela: Secondary | ICD-10-CM | POA: Diagnosis not present

## 2022-12-25 LAB — URINALYSIS, COMPLETE (UACMP) WITH MICROSCOPIC
Bilirubin Urine: NEGATIVE
Glucose, UA: NEGATIVE mg/dL
Hgb urine dipstick: NEGATIVE
Ketones, ur: 20 mg/dL — AB
Leukocytes,Ua: NEGATIVE
Nitrite: NEGATIVE
Protein, ur: NEGATIVE mg/dL
Specific Gravity, Urine: 1.021 (ref 1.005–1.030)
pH: 5 (ref 5.0–8.0)

## 2022-12-25 LAB — GLUCOSE, CAPILLARY: Glucose-Capillary: 137 mg/dL — ABNORMAL HIGH (ref 70–99)

## 2022-12-25 MED ORDER — GUAIFENESIN ER 600 MG PO TB12: 600.0000 mg | ORAL_TABLET | Freq: Two times a day (BID) | ORAL | Status: DC

## 2022-12-25 NOTE — ED Notes (Signed)
Pt given prn Ativan for anxiety.

## 2022-12-25 NOTE — ED Notes (Signed)
RT placed pt on Heated High Flow 50L/min pt current sats are 91%.

## 2022-12-25 NOTE — ED Notes (Signed)
Pt took off BiPap. Pt extremely anxious and needs multiple redirection. This RN did reattach Bipap mask and called assigned RT, she was working a code and did request I call another RT.  This RN then called all RT on shift.  RT did come to assist with BiPap and did adjust settings.

## 2022-12-25 NOTE — Progress Notes (Signed)
OT Cancellation Note  Patient Details Name: Parker Martinez MRN: [id number] DOB: 1941/06/09   Cancelled Treatment:    Reason Eval/Treat Not Completed: Medical issues which prohibited therapy. Per chart review pt recently placed on heated high flow 50L/min. Spoke to nursing who requested PT/OT hold this date secondary to respiratory distress. Will attempt to see pt at a future date/time as medically appropriate.   Dorene Grebe  [facility] 12/25/2022, 9:02 AM

## 2022-12-25 NOTE — ED Notes (Signed)
RT at bedside

## 2022-12-25 NOTE — ED Notes (Signed)
Pt banging on the table and requesting Pulling off BiPaP yelling "Help". RT at bedside coaching pt how to manage breathing.

## 2022-12-25 NOTE — Progress Notes (Signed)
RT called to room for Pt distress tripodding and desating low 80's sitting on side of bed. Pt placed on HHFNC 50L 82%. Pt stated he felt much better. Will continue to monitor closely

## 2022-12-25 NOTE — Progress Notes (Signed)
RT called to room for pt resp distress/Tripodding sitting on side of bed and desat into 70's. Pt placed on BiPAP 15/8 R10 50%. Pt appears more comfortable resting in bed. Will continue to monitor closely. MD aware.

## 2022-12-25 NOTE — ED Notes (Signed)
Pt unable to manage breathing. Moaning and not able to breath comfortably. High flow increased to 8L High Flow. MD at bedside.

## 2022-12-25 NOTE — Progress Notes (Addendum)
Decreased pressures to 12/5 for pt comfort oxygen sat maintained mid 90's  Pressures decreased to 10/5 due to high tidal volumes

## 2022-12-25 NOTE — Progress Notes (Signed)
PT Cancellation Note  Patient Details Name: ORA MCNATT MRN: [id number] DOB: January 21, 1941   Cancelled Treatment:    Reason Eval/Treat Not Completed: Medical issues which prohibited therapy: Per chart review pt recently placed on heated high flow 50L/min.  Spoke to nursing who requested PT/OT hold this date secondary to respiratory distress. Will attempt to see pt at a future date/time as medically appropriate.    Ovidio Hanger PT, DPT 12/25/22, 8:50 AM

## 2022-12-25 NOTE — Assessment & Plan Note (Signed)
Likely secondary to IV pain meds.  Continue to monitor.  Started on midodrine and fluids.

## 2022-12-25 NOTE — ED Notes (Signed)
Pt no able to manage breathing increased High Flow to 15 L patient sats 96%. Continues to [place]. MD notified.

## 2022-12-25 NOTE — TOC Benefit Eligibility Note (Signed)
Patient Product/process development scientist completed.    The patient is currently admitted and upon discharge could be taking Eliquis Starter Pack.  The current 30 day co-pay is $47.00.   The patient is insured through [facility] Medicare Part D  Roland Earl, CPHT Pharmacy Patient Advocate Specialist [facility] Patient Advocate Team Direct Number: [phone]  Fax: [phone]

## 2022-12-25 NOTE — ED Notes (Signed)
Called RT due to pt tripod sitting and not able to manage breathing.  RT at bedside.

## 2022-12-25 NOTE — Progress Notes (Signed)
Progress Note   Patient: Parker Martinez ZWR:[id number] DOB: [birth year] DOA: 12/05/2022     2 DOS: the patient was seen and examined on 12/25/2022   Brief hospital course: 82 y.o. male with medical history significant for diverticulosis, COPD with chronic respiratory failure on 2 L of oxygen, peripheral arterial disease, history of stage I none small cell lung cancer status post SBRT, hypertension, GERD, alcohol dependence, degenerative disc disease who presents to the ER via EMS for evaluation of left lower lateral chest wall pain that he said woke him up out of his sleep.  He describes the pain as a sharp continuous pain rated an 8 x 10 in intensity at its worst with no relieving factors.  Pain is worse with inspiration or any form of movement.  Chest pain is associated with shortness of breath but he denies having any cough, no fever, no chills, no nausea, no vomiting, no headache, no dizziness, no lightheadedness, no urinary symptoms, no changes in his bowel habits, no focal deficit or blurred vision.  He denied any recent falls or trauma but according to his wife he fell about a month ago. CT angiogram of the chest is positive for acute pulmonary embolus at the left lingula and lower lobe pulmonary artery bifurcation. Additional lingula thrombus appears occlusive. No superimposed saddle embolus or CTA evidence of right heart strain. No evidence of pulmonary infarct at this time. No pleural effusion. Positive also for nondisplaced fractures of the left lateral 9th and 10th ribs, new since November and appear to be posttraumatic. Underlying advanced chronic lung disease, Emphysema treated left upper lobe stable since November restaging CT. indeterminate, spiculated left lower lobe pulmonary nodules (series 3, images 73-87) were previously thought to be inflammatory but persist. Although no growth since November. Continued cancer surveillance recommended. regressed but not resolved right upper lobe part  solid, part cavitary lesion. If this has not been specifically treated then regression favors benign etiology. Associated generalized thickening of the right lung airways extending to the hilum appears stable. Patient has been started on a heparin drip and will be admitted to the hospital for further evaluation.  1/21.  Patient needed to be placed on high flow nasal cannula in order to oxygenate.  Patient in a lot of pain started IV pain medication along with oral pain medication.  Heparin drip switched over to Eliquis. 1/22.  Patient still in a lot of pain.  Bruising over his left flank.  Increasing oxygen requirements because he is taking shallow breaths needed to be placed on BiPAP 50%.  Confirmed patient is a DNR.  Assessment and Plan: * Multiple rib fractures Patient still in quite a bit of pain with 3 rib fractures.  Incentive spirometry.  Continue IV and p.o. pain medications.  Patient needed to be placed on BiPAP today in order to oxygenate.   Acute on chronic respiratory failure with hypoxia (HCC) Patient chronically on 3 L of oxygen.  Immediately placed on BiPAP today secondary to hypoxia on high flow nasal cannula.  Pulse ox this morning in the 80s on high flow nasal cannula.  Pulmonary embolism on left (HCC) Continue Eliquis.  Lower extremity Doppler negative.  Echocardiogram shows an EF that is normal without strain.  COPD, severe (HCC) Patient with a history of end-stage COPD with chronic respiratory failure on 2 to 3 L of oxygen continuous and chronic prednisone.  Confirmed DNR status.  Patient on BiPAP now.  Stress dose steroids.  Non-small cell lung cancer [facility]) Patient  has a history of stage I non-small cell lung cancer of the left upper lobe and is status post SBRT Follow-up as outpatient.  Hypotension Likely secondary to IV pain meds.  Continue to monitor.  Started on midodrine and fluids.  Lactic acidosis Most likely secondary to his underlying COPD No evidence of  an infectious process at this time Monitor closely  Alcohol dependence [facility]) Patient admits to daily alcohol use Monitor closely and administer lorazepam for CIWA score of 8 or greater Place patient on MVI, thiamine and folic acid  PVD (peripheral vascular disease) with claudication (HCC) Continue atorvastatin  GERD (gastroesophageal reflux disease) Continue PPI        Subjective: Patient this morning in a lot of pain and moaning.  Needed to step up and oxygenation to BiPAP.  Admitted with rib fractures and PE.  Physical Exam: Vitals:   12/25/22 0845 12/25/22 0900 12/25/22 0929 12/25/22 1113  BP: 90/67     Pulse: (!) 132   (!) 122  Resp:    13  Temp:  98.2 F (36.8 C)    TempSrc:  Oral    SpO2: 100%  95% 96%  Weight:      Height:       Physical Exam HENT:     Head: Normocephalic.     Mouth/Throat:     Pharynx: No oropharyngeal exudate.  Eyes:     General: Lids are normal.     Conjunctiva/sclera: Conjunctivae normal.  Cardiovascular:     Rate and Rhythm: Normal rate and regular rhythm.     Heart sounds: Normal heart sounds, S1 normal and S2 normal.  Pulmonary:     Effort: Accessory muscle usage present.     Breath sounds: Examination of the right-lower field reveals decreased breath sounds. Examination of the left-lower field reveals decreased breath sounds. Decreased breath sounds present. No wheezing or rhonchi.  Abdominal:     Palpations: Abdomen is soft.     Tenderness: There is no abdominal tenderness.  Musculoskeletal:     Right lower leg: No swelling.     Left lower leg: No swelling.  Skin:    General: Skin is warm.     Findings: No rash.     Comments: Lots of bruising over left flank  Neurological:     Mental Status: He is alert and oriented to person, place, and time.     Data Reviewed: Creatinine 0.96, hemoglobin 11.5 Family Communication: Updated wife on the phone  Disposition: Status is: Inpatient Remains inpatient appropriate because:  Required to step up and oxygenation with continuous BiPAP  Planned Discharge Destination: To be determined    Time spent: 35 minutes, patient is critically ill and needed step-off and pain control and oxygenation treatments with BiPAP.  Case discussed with nursing staff and charge nurse and okay to go to progressive care unit on continuous BiPAP.  Confirmed DNR status.  Author: Alford Highland, MD 12/25/2022 12:23 PM  For on call review [email or web address].

## 2022-12-26 DIAGNOSIS — R5383 Other fatigue: Secondary | ICD-10-CM | POA: Diagnosis not present

## 2022-12-26 DIAGNOSIS — F1029 Alcohol dependence with unspecified alcohol-induced disorder: Secondary | ICD-10-CM

## 2022-12-26 DIAGNOSIS — K219 Gastro-esophageal reflux disease without esophagitis: Secondary | ICD-10-CM | POA: Diagnosis not present

## 2022-12-26 DIAGNOSIS — E440 Moderate protein-calorie malnutrition: Secondary | ICD-10-CM | POA: Diagnosis not present

## 2022-12-26 DIAGNOSIS — E872 Acidosis, unspecified: Secondary | ICD-10-CM | POA: Diagnosis not present

## 2022-12-26 LAB — CBC
HCT: 33.5 % — ABNORMAL LOW (ref 39.0–52.0)
Hemoglobin: 10.8 g/dL — ABNORMAL LOW (ref 13.0–17.0)
MCH: 32.4 pg (ref 26.0–34.0)
MCHC: 32.2 g/dL (ref 30.0–36.0)
MCV: 100.6 fL — ABNORMAL HIGH (ref 80.0–100.0)
Platelets: 163 10*3/uL (ref 150–400)
RBC: 3.33 MIL/uL — ABNORMAL LOW (ref 4.22–5.81)
RDW: 13.6 % (ref 11.5–15.5)
WBC: 11.5 10*3/uL — ABNORMAL HIGH (ref 4.0–10.5)
nRBC: 0 % (ref 0.0–0.2)

## 2022-12-26 LAB — BASIC METABOLIC PANEL
Anion gap: 9 (ref 5–15)
BUN: 25 mg/dL — ABNORMAL HIGH (ref 8–23)
CO2: 26 mmol/L (ref 22–32)
Calcium: 8.8 mg/dL — ABNORMAL LOW (ref 8.9–10.3)
Chloride: 107 mmol/L (ref 98–111)
Creatinine, Ser: 0.74 mg/dL (ref 0.61–1.24)
GFR, Estimated: >60 mL/min (ref >60)
Glucose, Bld: 143 mg/dL — ABNORMAL HIGH (ref 70–99)
Potassium: 4.3 mmol/L (ref 3.5–5.1)
Sodium: 142 mmol/L (ref 135–145)

## 2022-12-26 LAB — BLOOD GAS, ARTERIAL
Acid-Base Excess: 4.9 mmol/L — ABNORMAL HIGH (ref 0.0–2.0)
Bicarbonate: 30.4 mmol/L — ABNORMAL HIGH (ref 20.0–28.0)
Delivery systems: POSITIVE
Expiratory PAP: 5 cmH2O
FIO2: 35 %
Inspiratory PAP: 10 cmH2O
O2 Saturation: 99.3 %
Patient temperature: 37
RATE: 10 {breaths}/min
pCO2 arterial: 48 mmHg (ref 32–48)
pH, Arterial: 7.41 (ref 7.35–7.45)
pO2, Arterial: 114 mmHg — ABNORMAL HIGH (ref 83–108)

## 2022-12-26 LAB — GLUCOSE, CAPILLARY: Glucose-Capillary: 138 mg/dL — ABNORMAL HIGH (ref 70–99)

## 2022-12-26 MED ORDER — GABAPENTIN 100 MG PO CAPS: 100.0000 mg | ORAL_CAPSULE | Freq: Every day | ORAL | Status: DC

## 2022-12-26 MED ORDER — MORPHINE SULFATE (PF) 2 MG/ML IV SOLN
0.5000 mg | INTRAVENOUS | Status: DC | PRN
  Administered 2022-12-26 (×2): 0.5 mg via INTRAVENOUS
  Filled 2022-12-26 (×2): qty 1

## 2022-12-26 MED ORDER — HYDROCORTISONE SOD SUC (PF) 100 MG IJ SOLR
25.0000 mg | Freq: Two times a day (BID) | INTRAMUSCULAR | Status: DC
  Administered 2022-12-26 (×2): 25 mg via INTRAVENOUS
  Filled 2022-12-26 (×3): qty 0.5

## 2022-12-26 MED ORDER — THIAMINE HCL 100 MG/ML IJ SOLN
500.0000 mg | Freq: Three times a day (TID) | INTRAVENOUS | Status: DC
  Administered 2022-12-26 (×3): 500 mg via INTRAVENOUS
  Filled 2022-12-26 (×5): qty 5

## 2022-12-26 MED ORDER — ENOXAPARIN SODIUM 80 MG/0.8ML IJ SOSY
1.0000 mg/kg | PREFILLED_SYRINGE | Freq: Two times a day (BID) | INTRAMUSCULAR | Status: DC
  Administered 2022-12-26: 67.5 mg via SUBCUTANEOUS
  Filled 2022-12-26: qty 0.8

## 2022-12-26 MED ORDER — LORAZEPAM 1 MG PO TABS: 1.0000 mg | ORAL_TABLET | ORAL | Status: DC | PRN

## 2022-12-26 MED ORDER — SODIUM CHLORIDE 0.9 % IV SOLN
1.0000 mg | Freq: Every day | INTRAVENOUS | Status: DC
  Administered 2022-12-26: 1 mg via INTRAVENOUS
  Filled 2022-12-26 (×2): qty 0.2

## 2022-12-26 MED ORDER — LORAZEPAM 2 MG/ML IJ SOLN
1.0000 mg | INTRAMUSCULAR | Status: DC | PRN
  Administered 2022-12-26: 3 mg via INTRAVENOUS
  Administered 2022-12-26 – 2022-12-27 (×2): 4 mg via INTRAVENOUS
  Filled 2022-12-26 (×3): qty 2

## 2022-12-26 NOTE — Progress Notes (Signed)
OT Cancellation Note  Patient Details Name: Parker Martinez MRN: [id number] DOB: [birth year]   Cancelled Treatment:    Reason Eval/Treat Not Completed: Medical issues which prohibited therapy Per chart review, pt is currently on Bipap. RN confirms and states CIWA protocol initiated and pt receiving ativan. Pt is not medically ready to participate and this has been third consecutive attempt to evaluate. OT to complete order at this time. Please re-consult when pt is able to participate.   Jackquline Denmark, MS, OTR/L , CBIS ascom [phone]  12/26/22, 10:46 AM

## 2022-12-26 NOTE — Consult Note (Signed)
ANTICOAGULATION CONSULT NOTE - Initial Consult  Pharmacy Consult for therapeutic enoxaparin Indication: pulmonary embolus  Allergies  Allergen Reactions   Flonase [Fluticasone Propionate] Other (See Comments)    Per pt, causes rebound effect     Patient Measurements: Height: 5' 7.5" (171.5 cm) Weight: 67.7 kg (149 lb 3.2 oz) IBW/kg (Calculated) : 67.25   Vital Signs: Temp: 98.5 F (36.9 C) (01/23 1200) Temp Source: Axillary (01/23 1200) BP: 89/58 (01/23 1200) Pulse Rate: 109 (01/23 1200)  Labs: Recent Labs    12/10/2022 1837 12/24/22 0249 12/26/22 0341  HGB  --  11.5* 10.8*  HCT  --  35.8* 33.5*  PLT  --  183 163  HEPARINUNFRC 0.55 0.50  --   CREATININE  --  0.96 0.74    Estimated Creatinine Clearance: 67.8 mL/min (by C-G formula based on SCr of 0.74 mg/dL).   Medical History: Past Medical History:  Diagnosis Date   Abnormal drug screen 10/2013, 03/2015   low Cr, +EtOH, neg hydrocodone (10/2013, again 03/2015)   Allergic rhinitis    Arthritis    lumbar and cervical spine   Carotid stenosis 09/11/2014   40-59% bilat ICA stenosis, rpt 1 yr (09/2014)    COPD (chronic obstructive pulmonary disease) (HCC)    severe, participated in [facility] study   DDD (degenerative disc disease)    lumbar (HNP R L3/4) s/p ESI, cervical   Diverticulitis 2015   by CT   Diverticulosis 2008   severe by colonoscopy and CT   Duodenitis    Esophageal stricture 2012   s/p dilation   GERD (gastroesophageal reflux disease)    Hemangioma    congenital left arm   History of kidney stones remote   HTN (hypertension)    borderline readings at home   Idiopathic anaphylaxis    h/o recurrent anaphylaxis, unknown trigger, s/p eval at [facility] [phone]), last episode 03/2011   PAD (peripheral artery disease) (HCC)    Personal history of colonic adenomas 09/02/2013   Prostate cancer (HCC) 2011   prostate seed implant (Dr. Earlene Plater)   Smoker    Status post dilatation of esophageal  stricture    Subclavian steal syndrome 10/12/2016   Left subclavian steal, with 18 mmHg brachial artery pressure gradient (10/2016). If symptomatic, rec PV consult    Medications:  Patient had been started on Eliquis, but unable to take due to being on BiPap  Assessment: Patient is an 82 y/o M with medical history as above and including COPD on 3L O2 at baseline, lung cancer who presented to the ED 1/20 with abdominal pain. Found to have acute PE. Pharmacy consulted to transition Eliquis to therapeutic enoxaparin.  Goal of Therapy:  Monitor platelets by anticoagulation protocol: Yes   Plan:  Enoxaparin 1 mg/kg (67.5 mg)  SubQ every 12 hours CBC every 72 hours while on enoxaparin   Barrie Folk, PharmD 12/26/2022,4:52 PM

## 2022-12-26 NOTE — Progress Notes (Signed)
PT Cancellation Note  Patient Details Name: Parker Martinez MRN: [id number] DOB: Jun 21, 1941   Cancelled Treatment:    Reason Eval/Treat Not Completed: Medical issues which prohibited therapy.  Chart reviewed.  Pt currently on Bipap (per OT: RN confirmed this and that CIWA protocol initiated and pt receiving Ativan).  Pt currently not medically appropriate to participate in therapy.  D/t unable to eval pt (with 3 consecutive attempts), will complete PT order at this time.  Please re-consult PT when pt is medically appropriate and able to participate in therapy.  Hendricks Limes, PT 12/26/22, 11:26 AM

## 2022-12-26 NOTE — Care Management Important Message (Signed)
Important Message  Patient Details  Name: Parker Martinez MRN: [id number] Date of Birth: [birth year]   Medicare Important Message Given:  Yes  Asleep upon time of visit.  Copy of Medicare IM left on counter in room for reference.    Johnell Comings 12/26/2022, 9:45 AM

## 2022-12-26 NOTE — Progress Notes (Signed)
Initial Nutrition Assessment  DOCUMENTATION CODES:   Non-severe (moderate) malnutrition in context of chronic illness  INTERVENTION:   -RD will follow for diet advancement and add supplements as appropriate -If within goals of care and pt remains NPO, may need to consider initiation of enteral nutrition support:  Initiate Osmolite 1.5 @ 20 ml/hr and increase by 10 ml every 8 hours to goal rate of 55 ml/hr.   60 ml Prosource TF daily    170 ml free water flush every 4 hours  Tube feeding regimen provides 2060 kcal (100% of needs), 103 grams of protein, and 1006 ml of H2O. Total free water: 2026 ml daily   -If feedings are initiated, monitor K, Mg, and Phos and replete as needed secondary to high refeeding risk  NUTRITION DIAGNOSIS:   Moderate Malnutrition related to chronic illness (COPD) as evidenced by mild fat depletion, moderate fat depletion, moderate muscle depletion, severe muscle depletion.  GOAL:   Patient will meet greater than or equal to 90% of their needs  MONITOR:   PO intake, Supplement acceptance, Diet advancement  REASON FOR ASSESSMENT:   Malnutrition Screening Tool    ASSESSMENT:   Pt with medical history significant for diverticulosis, COPD with chronic respiratory failure on 2 L of oxygen, peripheral arterial disease, history of stage I none small cell lung cancer status post SBRT, hypertension, GERD, alcohol dependence, degenerative disc disease who presents for evaluation of left lower lateral chest wall pain that he said woke him up out of his sleep.  Pt admitted with multiple fib fractures, lt pulmonary embolism, and COPD.   1/22- placed on Bi-pap for oxygenation.   Reviewed I/O's: -200 ml x 24 hours and +992 ml since admission  UOP: 200 ml x 24 hours  Pt lying in bed at time of visit. Pt currently on Bi-pap. No family at bedside. Pt did not arouse to voice or touch.    Reviewed wt hx; wt has been stable over the past 3 months.    Medications reviewed and include vitamin B-12, solu-cortef, folic acid, thiamine, and 0.9% sodium chloride infusion @ 75 ml/hr.   Labs reviewed: CBGS: 137-138.    NUTRITION - FOCUSED PHYSICAL EXAM:  Flowsheet Row Most Recent Value  Orbital Region Mild depletion  Upper Arm Region Moderate depletion  Thoracic and Lumbar Region Moderate depletion  Buccal Region Mild depletion  Temple Region Moderate depletion  Clavicle Bone Region Moderate depletion  Clavicle and Acromion Bone Region Moderate depletion  Scapular Bone Region Severe depletion  Dorsal Hand Severe depletion  Patellar Region Moderate depletion  Anterior Thigh Region Moderate depletion  Posterior Calf Region Moderate depletion  Edema (RD Assessment) None  Hair Reviewed  Eyes Reviewed  Mouth Reviewed  Skin Reviewed  Nails Reviewed       Diet Order:   Diet Order             Diet NPO time specified  Diet effective now                   EDUCATION NEEDS:   Not appropriate for education at this time  Skin:  Skin Assessment: Reviewed RN Assessment  Last BM:  Unknown  Height:   Ht Readings from Last 1 Encounters:  12/05/2022 5' 7.5" (1.715 m)    Weight:   Wt Readings from Last 1 Encounters:  12/25/22 67.7 kg    Ideal Body Weight:  68.6 kg  BMI:  Body mass index is 23.02 kg/m.  Estimated Nutritional  Needs:   Kcal:  2000-2200  Protein:  100-115 grams  Fluid:  > 2 L    Levada Schilling, RD, LDN, CDCES Registered Dietitian II Certified Diabetes Care and Education Specialist Please refer to [facility] for RD and/or RD on-call/weekend/after hours pager

## 2022-12-26 NOTE — TOC Progression Note (Signed)
Transition of Care [facility]) - Progression Note    Patient Details  Name: Parker Martinez MRN: [id number] Date of Birth: Nov 03, 1941  Transition of [facility]) CM/SW Contact  Truddie Hidden, RN Phone Number: 12/26/2022, 1:42 PM  Clinical Narrative:    Case reviewed for needs and disposition.         Expected Discharge Plan and Services                                               Social Determinants of Health (SDOH) Interventions SDOH Screenings   Food Insecurity: No Food Insecurity (11/03/2022)  Housing: Low Risk  (11/02/2021)  Transportation Needs: No Transportation Needs (11/03/2022)  Alcohol Screen: Low Risk  (11/02/2021)  Depression (PHQ2-9): Low Risk  (11/03/2022)  Financial Resource Strain: Low Risk  (11/03/2022)  Physical Activity: Sufficiently Active (11/02/2021)  Social Connections: Moderately Isolated (11/03/2022)  Stress: No Stress Concern Present (11/03/2022)  Tobacco Use: Medium Risk (12/25/2022)    Readmission Risk Interventions     No data to display

## 2022-12-26 NOTE — Plan of Care (Signed)
  Problem: Education: Goal: Knowledge of disease or condition will improve Outcome: Not Progressing Goal: Knowledge of the prescribed therapeutic regimen will improve Outcome: Not Progressing Goal: Individualized Educational Video(s) Outcome: Not Progressing   Problem: Activity: Goal: Ability to tolerate increased activity will improve Outcome: Not Progressing Goal: Will verbalize the importance of balancing activity with adequate rest periods Outcome: Not Progressing   Problem: Respiratory: Goal: Ability to maintain a clear airway will improve Outcome: Not Progressing Goal: Levels of oxygenation will improve Outcome: Not Progressing Goal: Ability to maintain adequate ventilation will improve Outcome: Not Progressing   Problem: Education: Goal: Knowledge of General Education information will improve Description: Including pain rating scale, medication(s)/side effects and non-pharmacologic comfort measures Outcome: Not Progressing   Problem: Health Behavior/Discharge Planning: Goal: Ability to manage health-related needs will improve Outcome: Not Progressing   Problem: Clinical Measurements: Goal: Ability to maintain clinical measurements within normal limits will improve Outcome: Not Progressing Goal: Will remain free from infection Outcome: Not Progressing Goal: Diagnostic test results will improve Outcome: Not Progressing Goal: Respiratory complications will improve Outcome: Not Progressing Goal: Cardiovascular complication will be avoided Outcome: Not Progressing   Problem: Activity: Goal: Risk for activity intolerance will decrease Outcome: Not Progressing   Problem: Nutrition: Goal: Adequate nutrition will be maintained Outcome: Not Progressing   Problem: Coping: Goal: Level of anxiety will decrease Outcome: Not Progressing   Problem: Elimination: Goal: Will not experience complications related to bowel motility Outcome: Not Progressing Goal: Will not  experience complications related to urinary retention Outcome: Not Progressing   Problem: Pain Managment: Goal: General experience of comfort will improve Outcome: Not Progressing   Problem: Safety: Goal: Ability to remain free from injury will improve Outcome: Not Progressing   Problem: Skin Integrity: Goal: Risk for impaired skin integrity will decrease Outcome: Not Progressing

## 2022-12-26 NOTE — Assessment & Plan Note (Signed)
Likely secondary to pain meds.  Decreased dose of morphine.  Will hold gabapentin.  Will get ABG.

## 2022-12-26 NOTE — Progress Notes (Signed)
Patient admitted to the unit and a skin assessment was completed by myself and Richardo Priest. Patient has bruising to his left abdomen/chest area, bilateral arms, scabs on left elbow with bleeding, bruised fingers on left hand, bilateral leg bruises, left upper back/shoulder bruise, and left hip/groin area. Heel mepalexes and left elbow mepalex applied. Patient currently on BiPAP, bed in the lowest position, call light within reach, and bed alarm activated.

## 2022-12-26 NOTE — Assessment & Plan Note (Signed)
Supplements when able to eat.

## 2022-12-26 NOTE — Progress Notes (Signed)
Progress Note   Patient: Parker Martinez DOB: 07/24/1941 DOA: 12/15/2022     3 DOS: the patient was seen and examined on 12/26/2022   Brief hospital course: 82 y.o. male with medical history significant for diverticulosis, COPD with chronic respiratory failure on 2 L of oxygen, peripheral arterial disease, history of stage I none small cell lung cancer status post SBRT, hypertension, GERD, alcohol dependence, degenerative disc disease who presents to the ER via EMS for evaluation of left lower lateral chest wall pain that he said woke him up out of his sleep.  He describes the pain as a sharp continuous pain rated an 8 x 10 in intensity at its worst with no relieving factors.  Pain is worse with inspiration or any form of movement.  Chest pain is associated with shortness of breath but he denies having any cough, no fever, no chills, no nausea, no vomiting, no headache, no dizziness, no lightheadedness, no urinary symptoms, no changes in his bowel habits, no focal deficit or blurred vision.  He denied any recent falls or trauma but according to his wife he fell about a month ago. CT angiogram of the chest is positive for acute pulmonary embolus at the left lingula and lower lobe pulmonary artery bifurcation. Additional lingula thrombus appears occlusive. No superimposed saddle embolus or CTA evidence of right heart strain. No evidence of pulmonary infarct at this time. No pleural effusion. Positive also for nondisplaced fractures of the left lateral 9th and 10th ribs, new since November and appear to be posttraumatic. Underlying advanced chronic lung disease, Emphysema treated left upper lobe stable since November restaging CT. indeterminate, spiculated left lower lobe pulmonary nodules (series 3, images 73-87) were previously thought to be inflammatory but persist. Although no growth since November. Continued cancer surveillance recommended. regressed but not resolved right upper lobe part  solid, part cavitary lesion. If this has not been specifically treated then regression favors benign etiology. Associated generalized thickening of the right lung airways extending to the hilum appears stable. Patient has been started on a heparin drip and will be admitted to the hospital for further evaluation.  1/21.  Patient needed to be placed on high flow nasal cannula in order to oxygenate.  Patient in a lot of pain started IV pain medication along with oral pain medication.  Heparin drip switched over to Eliquis. 1/22.  Patient still in a lot of pain.  Bruising over his left flank.  Increasing oxygen requirements because he is taking shallow breaths needed to be placed on BiPAP 50%.  Confirmed patient is a DNR. 1/23.  Patient more lethargic but seen after pain medications.  Patient on BiPAP.  Assessment and Plan: * Multiple rib fractures Patient still in quite a bit of pain with 3 rib fractures.  Incentive spirometry.  Continue IV and p.o. pain medications but decrease the dose of the IV pain medication since more lethargic today.  Patient placed on continuous BiPAP yesterday in order to oxygenate with his severe pain.  On 35% FiO2.   Lethargy Likely secondary to pain meds.  Decreased dose of morphine.  Will hold gabapentin.  Will get ABG.  Acute on chronic respiratory failure with hypoxia (HCC) Patient chronically on 3 L of oxygen.  Placed on BiPAP on 1/22 secondary to hypoxia on high flow nasal cannula, on 35% FiO2.  Pulse ox 1/22 in the 80s on high flow nasal cannula.  ABG ordered.  Pulmonary embolism on left [facility]) Continue Eliquis if able to  take if unable to take may have to switch over to Lovenox injections.  Lower extremity Doppler negative.  Echocardiogram shows an EF that is normal without strain.  COPD, severe (HCC) Patient with a history of end-stage COPD with chronic respiratory failure on 2 to 3 L of oxygen continuous and chronic prednisone.  Confirmed DNR status.  Patient  on BiPAP now.  Taper steroids.  Non-small cell lung cancer [facility]) Patient has a history of stage I non-small cell lung cancer of the left upper lobe and is status post SBRT Follow-up as outpatient.  Malnutrition of moderate degree Supplements when able to eat.  Hypotension Likely secondary to IV pain meds.  Continue to monitor.  Started on midodrine and fluids.  Lactic acidosis Most likely secondary to his underlying COPD No evidence of an infectious process at this time Monitor closely  Alcohol dependence [facility]) Patient's wife thinks he does not drink that much only has a couple drinks at night.  Has received some Ativan here. Monitor closely and administer lorazepam for CIWA score of 8 or greater Place patient on MVI, thiamine and folic acid  PVD (peripheral vascular disease) with claudication (HCC) Continue atorvastatin if able to take  GERD (gastroesophageal reflux disease) Continue PPI        Subjective: Patient was just given pain medications when I saw him and opened his eyes to sternal rub.  He is on BiPAP and I DO NOT RESUSCITATE.  Previously a lot of pain secondary to rib fracture.  Also treating for pulmonary embolism.  Physical Exam: Vitals:   12/26/22 0820 12/26/22 0900 12/26/22 0952 12/26/22 1200  BP:    (!) 89/58  Pulse: (!) 135 (!) 114 (!) 128 (!) 109  Resp:    (!) 23  Temp:    98.5 F (36.9 C)  TempSrc:    Axillary  SpO2:    95%  Weight:      Height:       Physical Exam HENT:     Head: Normocephalic.     Mouth/Throat:     Pharynx: No oropharyngeal exudate.  Eyes:     General: Lids are normal.     Conjunctiva/sclera: Conjunctivae normal.  Cardiovascular:     Rate and Rhythm: Regular rhythm. Tachycardia present.     Heart sounds: Normal heart sounds, S1 normal and S2 normal.  Pulmonary:     Breath sounds: Examination of the right-lower field reveals decreased breath sounds. Examination of the left-lower field reveals decreased breath sounds.  Decreased breath sounds present. No wheezing or rhonchi.  Abdominal:     Palpations: Abdomen is soft.     Tenderness: There is no abdominal tenderness.  Musculoskeletal:     Right lower leg: No swelling.     Left lower leg: No swelling.  Skin:    General: Skin is warm.     Findings: No rash.     Comments: Lots of bruising over left flank  Neurological:     Mental Status: He is lethargic.     Comments: Opens eyes to sternal rub but went back to sleep.     Data Reviewed: Creatinine is 0.74, hemoglobin 10.8, white blood cell count 11.5, platelet count 163  Family Communication: Spoke with patient's wife and given update.  Disposition: Status is: Inpatient Remains inpatient appropriate because: Patient with altered mental status today secondary to pain medications.  Planned Discharge Destination: To be determined    Time spent: 28 minutes  Author: Alford Highland, MD 12/26/2022 4:44  PM  For on call review [email or web address].

## 2022-12-27 ENCOUNTER — Inpatient Hospital Stay: Payer: PPO

## 2022-12-27 DIAGNOSIS — J449 Chronic obstructive pulmonary disease, unspecified: Secondary | ICD-10-CM | POA: Diagnosis not present

## 2022-12-27 DIAGNOSIS — I2699 Other pulmonary embolism without acute cor pulmonale: Secondary | ICD-10-CM | POA: Diagnosis not present

## 2022-12-27 DIAGNOSIS — E870 Hyperosmolality and hypernatremia: Secondary | ICD-10-CM | POA: Diagnosis not present

## 2022-12-27 DIAGNOSIS — J9621 Acute and chronic respiratory failure with hypoxia: Secondary | ICD-10-CM | POA: Diagnosis not present

## 2022-12-27 LAB — COMPREHENSIVE METABOLIC PANEL
ALT: 26 U/L (ref 0–44)
AST: 34 U/L (ref 15–41)
Albumin: 3.2 g/dL — ABNORMAL LOW (ref 3.5–5.0)
Alkaline Phosphatase: 50 U/L (ref 38–126)
Anion gap: 8 (ref 5–15)
BUN: 27 mg/dL — ABNORMAL HIGH (ref 8–23)
CO2: 28 mmol/L (ref 22–32)
Calcium: 9.2 mg/dL (ref 8.9–10.3)
Chloride: 111 mmol/L (ref 98–111)
Creatinine, Ser: 0.77 mg/dL (ref 0.61–1.24)
GFR, Estimated: >60 mL/min (ref >60)
Glucose, Bld: 91 mg/dL (ref 70–99)
Potassium: 4.3 mmol/L (ref 3.5–5.1)
Sodium: 147 mmol/L — ABNORMAL HIGH (ref 135–145)
Total Bilirubin: 1.2 mg/dL (ref 0.3–1.2)
Total Protein: 5.9 g/dL — ABNORMAL LOW (ref 6.5–8.1)

## 2022-12-27 LAB — BLOOD GAS, ARTERIAL
Acid-Base Excess: 1.6 mmol/L (ref 0.0–2.0)
Bicarbonate: 31.4 mmol/L — ABNORMAL HIGH (ref 20.0–28.0)
FIO2: 50 %
O2 Content: 40 L/min
O2 Saturation: 100 %
Patient temperature: 37
pCO2 arterial: 75 mmHg (ref 32–48)
pH, Arterial: 7.23 — ABNORMAL LOW (ref 7.35–7.45)
pO2, Arterial: 177 mmHg — ABNORMAL HIGH (ref 83–108)

## 2022-12-27 LAB — CBC
HCT: 34.8 % — ABNORMAL LOW (ref 39.0–52.0)
Hemoglobin: 10.9 g/dL — ABNORMAL LOW (ref 13.0–17.0)
MCH: 31.8 pg (ref 26.0–34.0)
MCHC: 31.3 g/dL (ref 30.0–36.0)
MCV: 101.5 fL — ABNORMAL HIGH (ref 80.0–100.0)
Platelets: 173 10*3/uL (ref 150–400)
RBC: 3.43 MIL/uL — ABNORMAL LOW (ref 4.22–5.81)
RDW: 13.9 % (ref 11.5–15.5)
WBC: 12.3 10*3/uL — ABNORMAL HIGH (ref 4.0–10.5)
nRBC: 0 % (ref 0.0–0.2)

## 2022-12-27 LAB — GLUCOSE, CAPILLARY
Glucose-Capillary: 83 mg/dL (ref 70–99)
Glucose-Capillary: 82 mg/dL (ref 70–99)

## 2022-12-27 MED ORDER — GLYCOPYRROLATE 0.2 MG/ML IJ SOLN
0.2000 mg | INTRAMUSCULAR | Status: DC | PRN
  Administered 2022-12-27: 0.2 mg via INTRAVENOUS
  Filled 2022-12-27: qty 1

## 2022-12-27 MED ORDER — ACETAMINOPHEN 650 MG RE SUPP: 650.0000 mg | Freq: Four times a day (QID) | RECTAL | Status: DC | PRN

## 2022-12-27 MED ORDER — ONDANSETRON HCL 4 MG/2ML IJ SOLN: 4.0000 mg | Freq: Four times a day (QID) | INTRAMUSCULAR | Status: DC | PRN

## 2022-12-27 MED ORDER — LEVALBUTEROL HCL 0.63 MG/3ML IN NEBU
0.6300 mg | INHALATION_SOLUTION | Freq: Once | RESPIRATORY_TRACT | Status: AC
  Administered 2022-12-27: 0.63 mg via RESPIRATORY_TRACT
  Filled 2022-12-27: qty 3

## 2022-12-27 MED ORDER — SODIUM CHLORIDE 0.9 % IV SOLN: INTRAVENOUS | Status: DC

## 2022-12-27 MED ORDER — MORPHINE BOLUS VIA INFUSION
5.0000 mg | INTRAVENOUS | Status: DC | PRN
  Administered 2022-12-27 (×3): 5 mg via INTRAVENOUS

## 2022-12-27 MED ORDER — MORPHINE 100MG IN NS 100ML (1MG/ML) PREMIX INFUSION
0.0000 mg/h | INTRAVENOUS | Status: DC
  Administered 2022-12-27: 5 mg/h via INTRAVENOUS
  Filled 2022-12-27: qty 100

## 2022-12-27 MED ORDER — LORAZEPAM 2 MG/ML IJ SOLN
2.0000 mg | INTRAMUSCULAR | Status: DC | PRN
  Administered 2022-12-27 (×2): 2 mg via INTRAVENOUS
  Filled 2022-12-27 (×2): qty 1

## 2022-12-27 MED ORDER — POLYVINYL ALCOHOL 1.4 % OP SOLN: 1.0000 [drp] | Freq: Four times a day (QID) | OPHTHALMIC | Status: DC | PRN

## 2022-12-27 MED ORDER — CHLORHEXIDINE GLUCONATE CLOTH 2 % EX PADS: 6.0000 | MEDICATED_PAD | Freq: Every day | CUTANEOUS | Status: DC

## 2022-12-27 MED ORDER — GLYCOPYRROLATE 0.2 MG/ML IJ SOLN
0.2000 mg | Freq: Once | INTRAMUSCULAR | Status: AC
  Administered 2022-12-27: 0.2 mg via INTRAVENOUS
  Filled 2022-12-27 (×2): qty 1

## 2023-01-04 NOTE — Progress Notes (Signed)
PT REQUIRING MITTS DUE TO RIPPING BIPAP MASK OFF FACE AND IMMEDIATELY DROPPING SATS AND FIGHTING WITH STAFF WHEN MEDICATION WEARS OFF. PLACED PT ON HHFNC AT 40L AND 50%. PT'S SATS ARE REMAINING AT 96% AND PT SEEMS TO BE TOLERATING WELL. NP AND PRIMARY RN MADE AWARE.

## 2023-01-04 NOTE — Consult Note (Signed)
NAME:  Parker Martinez, MRN:  [id number], DOB:  [birth year], LOS: 4 ADMISSION DATE:  12/11/2022, CONSULTATION DATE: 14-Jan-2023 REFERRING MD: Jaci Carrel, NP, CHIEF COMPLAINT: Respiratory Distress    History of Present Illness:  This is an 82 yo male who presented to [facility] ER on 01/20 via EMS with acute onset of LUQ abdominal pain.  He denied falls/injury/trauma/chest pain. All hx obtained from pts chart pt currently minimally responsive on Bipap  ED Course Upon arrival to the ER pt alert and oriented.  CXR concerning for persistent RUL cavitary process reflecting necrotic pneumonia vs aspiration.  CTA Chest revealed acute pulmonary embolus in the left lingula and lower lobe pulmonary artery bifurcation but no right heart strain.  He received morphine, zofran, toradol, and heparin gtt initiated.  He was subsequently admitted to the [facility] unit per hositalist team for additional workup and treatment.  See detailed hospital course listed under significant events  CTA Chest PE: . Positive for acute pulmonary embolus at the left lingula and lower lobe pulmonary artery bifurcation. Additional lingula thrombus appears occlusive. No superimposed saddle embolus or CTA evidence of right heart strain. No evidence of pulmonary infarct at this time. No pleural effusion. Positive also for nondisplaced fractures of the left lateral 9th and 10th ribs, new since November and appear to be posttraumatic. Underlying advanced chronic lung disease, Emphysema (ICD10-J43.9): treated left upper lobe stable since November restaging CT. indeterminate, spiculated left lower lobe pulmonary nodules (series 3, images 73-87) were previously thought to be inflammatory but persist. Although no growth since November. Continued cancer surveillance recommended.- regressed but not resolved right upper lobe part solid, part cavitary lesion. If this has not been specifically treated then regression favors benign etiology. Associated  generalized thickening of the right lung airways extending to the hilum appears stable. CT Abdomen and Pelvis today is reported separately. Aortic Atherosclerosis (ICD10-I70.0).  CT Abd/Pelvis: Recent posttraumatic appearing left lower rib fractures are seen to be ribs 8 through 10 on these images, mildly displaced and with mild intercostal/chest wall hematoma. See also abnormal Chest CTA reported separately. No superimposed acute or inflammatory process identified in the abdomen or pelvis. Advanced Aortic, aortoiliac Atherosclerosis (ICD10-I70.0). High-grade bilateral iliac artery stenosis, left superficial femoral artery stenosis, and chronically occluded right SFA. Chronic right nephrolithiasis, and chronic 6 mm stone within the bladder, present on PET-CT last year. Severe diverticulosis of distal large bowel. No active inflammation.   Pertinent  Medical History  Non-small Cell Lung Cancer COPD~wears chronic O2 @3L   Allergic Rhinitis  Arthritis  Degenerative Disc Disease  Diverticulitis  Duodenitis  Esophageal Stricture GERD  Hemangioma Kidney Stones HTN Idiopathic Anaphylaxis  PAD  Prostate Cancer  Smoker  Subclavian Steal Syndrome  Significant Hospital Events: Including procedures, antibiotic start and stop dates in addition to other pertinent events   01/20: Pt admitted with pulmonary embolism on heparin gtt  01/21: Patient needed to be placed on high flow nasal cannula in order to oxygenate. Patient in a lot of pain started IV pain medication along with oral pain medication. Heparin drip switched over to Eliquis.  01/22: Patient still in a lot of pain. Bruising over his left flank. Increasing oxygen requirements because he is taking shallow breaths needed to be placed on BiPAP 50%. Confirmed patient is DNR.  01-14-2023: Pt with worsening respiratory failure and acute encephalopathy unable to follow commands placed on Bipap.  CXR revealed left-sided hydropneumothorax with  moderate-to-large pneumothorax. PCCM team consulted to assist with management. Pts wife notified  by Dr. Mortimer Fries via telephone to come to bedside due to decline in pts condition to discuss goals of care.  Discussed goals of treatment with family and they decided to NOT proceed with aggressive measures and transition to Comfort Measures Only  Interim History / Subjective:  As outlined above   Objective   Blood pressure 123/74, pulse (!) 109, temperature 97.7 F (36.5 C), temperature source Axillary, resp. rate (!) 32, height 5' 7.5" (1.715 m), weight 67.7 kg, SpO2 99 %.    FiO2 (%):  [35 %-60 %] 40 %   Intake/Output Summary (Last 24 hours) at 01/21/2023 0809 Last data filed at 2023/01/21 0400 Gross per 24 hour  Intake 2191.68 ml  Output 950 ml  Net 1241.68 ml   Filed Weights   12/21/2022 0535 12/25/22 2124  Weight: 68 kg 67.7 kg    Examination: General: Acute on chronically-ill appearing male, in respiratory distress on Bipap HENT: Supple, JVD present  Lungs: Rhonchi present bilateral upper lobes, diminished throughout all other lobes worse on the left, labored and tachypneic  Cardiovascular: Sinus tachycardia, s1s2, no r/g, 2+ radial/1+ distal pulses, no edema  Abdomen: Hypoactive BS x4, soft, non distended  Extremities: Normal bulk and tone Neuro: Minimally responsive, unable to follow commands, right pupil 3 mm reactive /left pupil 1 mm very sluggish  GU: Deferred  Resolved Hospital Problem list     Assessment & Plan:  Following goals of care discussion with pts wife and daughter at bedside they have decided not to proceed with aggressive measures and would like to transition to Comfort Measures Only. - Morphine gtt with prn boluses for dyspnea and/or pain - Prn robinul for excessive secretions  - Prn ativan for anxiety  - Prn zofran for nausea/vomiting - Prn liquifilm tears for dry eyes  - Unrestricted visitors   Labs   CBC: Recent Labs  Lab 12/22/2022 0538 12/24/22 0249  12/26/22 0341 01/21/2023 0342  WBC 11.1* 10.7* 11.5* 12.3*  NEUTROABS 6.4  --   --   --   HGB 12.2* 11.5* 10.8* 10.9*  HCT 37.6* 35.8* 33.5* 34.8*  MCV 96.9 99.7 100.6* 101.5*  PLT 204 183 163 419    Basic Metabolic Panel: Recent Labs  Lab 12/28/2022 0538 12/24/22 0249 12/26/22 0341 01/21/23 0342  NA 138 136 142 147*  K 3.8 4.7 4.3 4.3  CL 101 101 107 111  CO2 25 27 26 28   GLUCOSE 95 60* 143* 91  BUN 12 22 25* 27*  CREATININE 0.91 0.96 0.74 0.77  CALCIUM 9.2 8.8* 8.8* 9.2   GFR: Estimated Creatinine Clearance: 67.8 mL/min (by C-G formula based on SCr of 0.77 mg/dL). Recent Labs  Lab 12/30/2022 0538 12/22/2022 0751 12/24/22 0249 12/26/22 0341 01/21/23 0342  WBC 11.1*  --  10.7* 11.5* 12.3*  LATICACIDVEN 3.4* 3.4*  --   --   --     Liver Function Tests: Recent Labs  Lab 12/15/2022 0538 January 21, 2023 0342  AST 25 34  ALT 24 26  ALKPHOS 58 50  BILITOT 0.4 1.2  PROT 6.5 5.9*  ALBUMIN 3.6 3.2*   Recent Labs  Lab 12/26/2022 0538  LIPASE 29   No results for input(s): "AMMONIA" in the last 168 hours.  ABG    Component Value Date/Time   PHART 7.23 (L) 2023/01/21 0448   PCO2ART 75 (HH) 01/21/23 0448   PO2ART 177 (H) 01-21-23 0448   HCO3 31.4 (H) 2023/01/21 0448   TCO2 22 05/30/2009 1200   O2SAT 100 01-21-2023  0448     Coagulation Profile: Recent Labs  Lab 12/28/2022 0843  INR 1.0    Cardiac Enzymes: No results for input(s): "CKTOTAL", "CKMB", "CKMBINDEX", "TROPONINI" in the last 168 hours.  HbA1C: No results found for: "HGBA1C"  CBG: Recent Labs  Lab 12/25/22 2341 12/26/22 0306 01-15-2023 0324 Jan 15, 2023 0704  GLUCAP 137* 138* 83 82    Review of Systems:   Unable to assess pt minimally responsive on Bipap   Past Medical History:  He,  has a past medical history of Abnormal drug screen (10/2013, 03/2015), Allergic rhinitis, Arthritis, Carotid stenosis (09/11/2014), COPD (chronic obstructive pulmonary disease) ([place]), DDD (degenerative disc disease),  Diverticulitis (2015), Diverticulosis (2008), Duodenitis, Esophageal stricture (2012), GERD (gastroesophageal reflux disease), Hemangioma, History of kidney stones (remote), HTN (hypertension), Idiopathic anaphylaxis, PAD (peripheral artery disease) ([place]), Personal history of colonic adenomas (09/02/2013), Prostate cancer ([place]) (2011), Smoker, Status post dilatation of esophageal stricture, and Subclavian steal syndrome (10/12/2016).   Surgical History:   Past Surgical History:  Procedure Laterality Date   BALLOON DILATION N/A 10/02/2014   Procedure: BALLOON DILATION;  Surgeon: Gatha Mayer, MD;  Location: [facility];  Service: Endoscopy;  Laterality: N/A;   BIOPSY  09/11/2022   Procedure: BIOPSY;  Surgeon: Gatha Mayer, MD;  Location: [facility];  Service: Gastroenterology;;   CATARACT EXTRACTION W/PHACO Right 01/26/2020   Procedure: CATARACT EXTRACTION PHACO AND INTRAOCULAR LENS PLACEMENT ([place]) RIGHT 5.15  00:42.9;  Surgeon: [name], MD;  Location: [place];  Service: Ophthalmology;  Laterality: Right;   CATARACT EXTRACTION W/PHACO Left 02/16/2020   Procedure: CATARACT EXTRACTION PHACO AND INTRAOCULAR LENS PLACEMENT ([facility]) LEFT 3.87  00:45.4;  Surgeon: [name], MD;  Location: [place];  Service: Ophthalmology;  Laterality: Left;   COLONOSCOPY  08/2013   4 tubular adenomas, severe divertic, rpt 3 yrs [name])   COLONOSCOPY  10/2016   severe diverticulosis, no f/u recommended [name])   ESOPHAGOGASTRODUODENOSCOPY  10/2016   2 rings dilated [name])   ESOPHAGOGASTRODUODENOSCOPY  01/2019   dilated esophageal webs [name])   ESOPHAGOGASTRODUODENOSCOPY (EGD) WITH PROPOFOL N/A 10/02/2014   WNL, mild esophagitis; Gatha Mayer, MD   ESOPHAGOGASTRODUODENOSCOPY (EGD) WITH PROPOFOL N/A 09/11/2022   dilated esophageal stenosis with multiple distal estophageal rings, biopsy: acute esophagitis, GERD [name], Ofilia Neas, MD)   INSERTION PROSTATE  RADIATION SEED  01/2015   [name]   LUMBAR MICRODISCECTOMY Left 03/2019   L L4/L5 [name])   [place]   due to chronic dislocation   SPIROMETRY  10/2010   severe obstruction, FEV1 41%, ratio 0.46   TONSILLECTOMY  1944 ?   UPPER GASTROINTESTINAL ENDOSCOPY  06/08/2009   dilated esophageal stricture, duodenitis   virtual colonoscopy  2007   [name], rpt rec 5 yrs     Social History:   reports that he quit smoking about a year ago. His smoking use included cigarettes. He has a 60.00 pack-year smoking history. He has never used smokeless tobacco. He reports current alcohol use of about 14.0 standard drinks of alcohol per week. He reports that he does not use drugs.   Family History:  His family history includes Arthritis in his mother; Breast cancer in his daughter; Hyperlipidemia in his father; Lung cancer in his father. There is no history of Coronary artery disease, Stroke, or Diabetes.   Allergies Allergies  Allergen Reactions   Flonase [Fluticasone Propionate] Other (See Comments)    Per pt, causes rebound effect      Home  Medications  Prior to Admission medications   Medication Sig Start Date End Date Taking? Authorizing Provider  acetaminophen (TYLENOL) 325 MG tablet Take 1 tablet (325 mg total) by mouth every 6 (six) hours as needed for mild pain, fever or headache (or Fever >/= 101). 10/28/20  Yes Nita Sells, MD  albuterol (PROVENTIL) (2.5 MG/3ML) 0.083% nebulizer solution INHALE 1 VIAL VIA NEBULIZER EVERY 4 HOURS AS NEEDED FOR WHEEZING OR SHORTNESS OF BREATH 10/23/22  Yes Tyler Pita, MD  albuterol (VENTOLIN HFA) 108 (90 Base) MCG/ACT inhaler USE 2 PUFFS BY MOUTH EVERY 6 HOURS AS NEEDED FOR WHEEZING 03/29/22  Yes Ria Bush, MD  alfuzosin (UROXATRAL) 10 MG 24 hr tablet Take 10 mg by mouth daily with breakfast.   Yes [provider]  aspirin 81 MG tablet Take 81 mg by mouth every morning.   Yes [provider]  atorvastatin  (LIPITOR) 40 MG tablet Take 1 tablet (40 mg total) by mouth daily. 11/10/22  Yes Ria Bush, MD  EPINEPHrine (EPIPEN 2-PAK) 0.3 mg/0.3 mL IJ SOAJ injection Inject 0.3 mg into the muscle as needed for anaphylaxis. 05/10/22  Yes Ria Bush, MD  fexofenadine (ALLEGRA) 180 MG tablet Take 180 mg by mouth in the morning.   Yes [provider]  gabapentin (NEURONTIN) 100 MG capsule Take 1-2 capsules (100-200 mg total) by mouth at bedtime. 11/10/22  Yes Ria Bush, MD  ipratropium (ATROVENT) 0.06 % nasal spray SPRAY 2 SPRAYS INTO EACH NOSTRIL 4 TIMES A DAY AS NEEDED FOR RHINITIS 05/04/22  Yes Tyler Pita, MD  montelukast (SINGULAIR) 10 MG tablet TAKE 1 TABLET BY MOUTH EVERYDAY AT BEDTIME 11/10/22  Yes Ria Bush, MD  Multiple Vitamin (MULTIVITAMIN) tablet Take 1 tablet by mouth in the morning.   Yes [provider]  pantoprazole (PROTONIX) 40 MG tablet Take 1 tablet (40 mg total) by mouth 2 (two) times daily before a meal. 11/10/22  Yes Ria Bush, MD  Polyethyl Glycol-Propyl Glycol (LUBRICANT EYE DROPS) 0.4-0.3 % SOLN Place 1-2 drops into both eyes 3 (three) times daily as needed (dry/irritated eyes.).   Yes [provider]  predniSONE (DELTASONE) 10 MG tablet TAKE 1 TABLET (10 MG TOTAL) BY MOUTH DAILY WITH BREAKFAST. 09/18/22  Yes Tyler Pita, MD  traMADol (ULTRAM) 50 MG tablet TAKE 1 TABLET BY MOUTH 2 TIMES DAILY AS NEEDED. 09/11/22  Yes Ria Bush, MD  vitamin B-12 (CYANOCOBALAMIN) 500 MCG tablet Take 500 mcg by mouth every morning.    Yes [provider]  Budeson-Glycopyrrol-Formoterol (BREZTRI AEROSPHERE) 160-9-4.8 MCG/ACT AERO Inhale 2 puffs into the lungs 2 (two) times daily. 03/21/22   Tyler Pita, MD  Roflumilast 250 MCG TABS TAKE 1 TABLET BY MOUTH EVERY DAY Patient not taking: Reported on 12/30/2022 06/23/22   Tyler Pita, MD     Critical care time: 50 minutes     PCCM team will sign off if you need  further assistance call pager# listed in [place], [place] Pager [phone] (please enter 7 digits) PCCM Consult Pager [phone] (please enter 7 digits)

## 2023-01-04 NOTE — Progress Notes (Signed)
Patient passed at 09:58- Son at bedside.

## 2023-01-04 NOTE — Death Summary Note (Addendum)
DEATH SUMMARY   Patient Details  Name: Parker Martinez MRN: [id number] DOB: August 30, 1941 NID:[id number], Garlon Hatchet, MD Admission/Discharge Information   Admit Date:  Jan 02, 2023  Date of Death:  January 06, 2023  Time of Death:  0958  Length of Stay: 4   Principle Cause of death: Acute on chronic respiratory failure with hypoxia and hypercapnia and hypotension (circulatory collapse) due to acute pulmonary embolism and hydrothorax.  Hospital Diagnoses: Principal Problem:   Multiple rib fractures Active Problems:   Acute on chronic respiratory failure with hypoxia (HCC)   Lethargy   COPD, severe (HCC)   Pulmonary embolism on left (HCC)   Non-small cell lung cancer (HCC)   GERD (gastroesophageal reflux disease)   PVD (peripheral vascular disease) with claudication (HCC)   Alcohol dependence (HCC)   Lactic acidosis   Hypotension   Malnutrition of moderate degree   Hypernatremia   Acute metabolic encephalopathy -- multifactorial due to hypoxia, pain medications, hypotension  Hospital Course: 82 y.o. male with medical history significant for diverticulosis, COPD with chronic respiratory failure on 2 L of oxygen, peripheral arterial disease, history of stage I none small cell lung cancer status post SBRT, hypertension, GERD, alcohol dependence, degenerative disc disease who presents to the ER via EMS for evaluation of left lower lateral chest wall pain that he said woke him up out of his sleep.  He describes the pain as a sharp continuous pain rated an 8 x 10 in intensity at its worst with no relieving factors.  Pain is worse with inspiration or any form of movement.  Chest pain is associated with shortness of breath but he denies having any cough, no fever, no chills, no nausea, no vomiting, no headache, no dizziness, no lightheadedness, no urinary symptoms, no changes in his bowel habits, no focal deficit or blurred vision.  He denied any recent falls or trauma but according to his wife he fell  about a month ago. CT angiogram of the chest is positive for acute pulmonary embolus at the left lingula and lower lobe pulmonary artery bifurcation. Additional lingula thrombus appears occlusive. No superimposed saddle embolus or CTA evidence of right heart strain. No evidence of pulmonary infarct at this time. No pleural effusion. Positive also for nondisplaced fractures of the left lateral 9th and 10th ribs, new since November and appear to be posttraumatic. Underlying advanced chronic lung disease, Emphysema treated left upper lobe stable since November restaging CT. indeterminate, spiculated left lower lobe pulmonary nodules (series 3, images 73-87) were previously thought to be inflammatory but persist. Although no growth since November. Continued cancer surveillance recommended. regressed but not resolved right upper lobe part solid, part cavitary lesion. If this has not been specifically treated then regression favors benign etiology. Associated generalized thickening of the right lung airways extending to the hilum appears stable. Patient has been started on a heparin drip and will be admitted to the hospital for further evaluation.  Per Dr. Leslye Peer: "1/21.  Patient needed to be placed on high flow nasal cannula in order to oxygenate.  Patient in a lot of pain started IV pain medication along with oral pain medication.  Heparin drip switched over to Eliquis. 1/22.  Patient still in a lot of pain.  Bruising over his left flank.  Increasing oxygen requirements because he is taking shallow breaths needed to be placed on BiPAP 50%.  Confirmed patient is a DNR. 1/23.  Patient more lethargic but seen after pain medications.  Patient on BiPAP."  01-06-2023. Overnight pt ripped  off BiPAP mask and was therefore placed on heated HFNC oxygen.  Rapid response called around 4 AM for central cyanosis, diffuse rhonchorous breath sounds with increased work of breathing and pt obtunded. CXR showed left-sided  hydrothorax.  ABG with worsening respiratory acidosis and hypercapnia.  Placed back on BiPAP.  PCCM was consulted for chest tube placement.   Goals of care discussions were had and family decided on comfort care measures this morning. Patient subsequently passed away at 0958 this morning.  Assessment and Plan: * Multiple rib fractures Patient still in quite a bit of pain with 3 rib fractures.  Incentive spirometry.  Continue IV and p.o. pain medications but decrease the dose of the IV pain medication since more lethargic today.  Patient placed on continuous BiPAP yesterday in order to oxygenate with his severe pain.  On 35% FiO2.   Lethargy Likely secondary to pain meds.  Decreased dose of morphine.  Will hold gabapentin.  Will get ABG.  Acute on chronic respiratory failure with hypoxia (HCC) Patient chronically on 3 L of oxygen.  Placed on BiPAP on 1/22 secondary to hypoxia on high flow nasal cannula, on 35% FiO2.  Pulse ox 1/22 in the 80s on high flow nasal cannula.  ABG ordered.  Pulmonary embolism on left [facility]) Continue Eliquis if able to take if unable to take may have to switch over to Lovenox injections.  Lower extremity Doppler negative.  Echocardiogram shows an EF that is normal without strain.  COPD, severe ([place]) Patient with a history of end-stage COPD with chronic respiratory failure on 2 to 3 L of oxygen continuous and chronic prednisone.  Confirmed DNR status.  Patient on BiPAP now.  Taper steroids.  Non-small cell lung cancer [facility]) Patient has a history of stage I non-small cell lung cancer of the left upper lobe and is status post SBRT Follow-up as outpatient.  Malnutrition of moderate degree Supplements when able to eat.  Hypotension Likely secondary to IV pain meds.  Continue to monitor.  Started on midodrine and fluids.  Lactic acidosis Most likely secondary to his underlying COPD No evidence of an infectious process at this time Monitor closely  Alcohol  dependence [facility]) Patient's wife thinks he does not drink that much only has a couple drinks at night.  Has received some Ativan here. Monitor closely and administer lorazepam for CIWA score of 8 or greater Place patient on MVI, thiamine and folic acid  PVD (peripheral vascular disease) with claudication (HCC) Continue atorvastatin if able to take  GERD (gastroesophageal reflux disease) Continue PPI         Procedures: None  Consultations: PCCM  The results of significant diagnostics from this hospitalization (including imaging, microbiology, ancillary and laboratory) are listed below for reference.   Significant Diagnostic Studies: DG Chest Port 1 View  Result Date: 2023/01/12 CLINICAL DATA:  82 year old male with history of dyspnea. EXAM: PORTABLE CHEST 1 VIEW COMPARISON:  Chest x-ray 01/23/2023. FINDINGS: New moderate to large left-sided pneumothorax. Minimally displaced fractures of the lateral left ninth and tenth ribs. Moderate left pleural effusion. Small right pleural effusion. Severe emphysematous changes in the lungs. Extensive upper lung predominant architectural distortion and patchy opacities, most evident near the apices of the lungs bilaterally, similar to prior studies, presumably reflective of neoplasm and evolving postradiation changes, although areas of acute infection/inflammation are not excluded. No evidence of pulmonary edema. Heart size is normal. Upper mediastinal contours are within normal limits allowing for patient positioning. Atherosclerotic calcifications are noted in  the thoracic aorta. IMPRESSION: 1. Interval development of left-sided hydropneumothorax with moderate-to-large pneumothorax component in moderate left pleural effusion. 2. Small right pleural effusion. 3. Advanced emphysema with what appears to be evolving postradiation changes in the upper lungs, in the setting of treated neoplasm, better demonstrated on recent chest CT. Critical Value/emergent  results were called by telephone at the time of interpretation on January 06, 2023 at 5:58 am to provider Neomia Glass NP, who verbally acknowledged these results. Electronically Signed   By: Vinnie Langton M.D.   On: 06-Jan-2023 05:59   ECHOCARDIOGRAM COMPLETE  Result Date: 12/24/2022    ECHOCARDIOGRAM REPORT   Patient Name:   ANTON CHERAMIE Date of Exam: 12/24/2022 Medical Rec #:  [id number]        Height:       67.5 in Accession #:    [id number]       Weight:       150.0 lb Date of Birth:  [birth year]        BSA:          1.799 m Patient Age:    45 years         BP:           130/85 mmHg Patient Gender: M                HR:           90 bpm. Exam Location:  [facility] Procedure: 2D Echo, Color Doppler, Cardiac Doppler and Intracardiac            Opacification Agent Indications:     Pulmonary Embolus  History:         Patient has prior history of Echocardiogram examinations. COPD;                  Risk Factors:Hypertension and Current Smoker.  Sonographer:     L. Thornton-Maynard Referring Phys:  [id number] Royce Macadamia AGBATA Diagnosing Phys: Ida Rogue MD  Sonographer Comments: Technically difficult study due to poor echo windows. IMPRESSIONS  1. Left ventricular ejection fraction, by estimation, is 60 to 65%. The left ventricle has normal function. The left ventricle has no regional wall motion abnormalities. Left ventricular diastolic parameters are consistent with Grade I diastolic dysfunction (impaired relaxation).  2. Right ventricular systolic function is normal. The right ventricular size is normal.  3. The mitral valve was not well visualized. No evidence of mitral valve regurgitation. No evidence of mitral stenosis.  4. The aortic valve was not well visualized. Aortic valve regurgitation is not visualized. No aortic stenosis is present.  5. The inferior vena cava is normal in size with greater than 50% respiratory variability, suggesting right atrial pressure of 3 mmHg. FINDINGS  Left Ventricle: Left ventricular  ejection fraction, by estimation, is 60 to 65%. The left ventricle has normal function. The left ventricle has no regional wall motion abnormalities. Definity contrast agent was given IV to delineate the left ventricular  endocardial borders. The left ventricular internal cavity size was normal in size. There is no left ventricular hypertrophy. Left ventricular diastolic parameters are consistent with Grade I diastolic dysfunction (impaired relaxation). Right Ventricle: The right ventricular size is normal. No increase in right ventricular wall thickness. Right ventricular systolic function is normal. Left Atrium: Left atrial size was normal in size. Right Atrium: Right atrial size was normal in size. Pericardium: There is no evidence of pericardial effusion. Mitral Valve: The mitral valve was not well visualized. No evidence of mitral valve regurgitation.  No evidence of mitral valve stenosis. Tricuspid Valve: The tricuspid valve is not well visualized. Tricuspid valve regurgitation is not demonstrated. No evidence of tricuspid stenosis. Aortic Valve: The aortic valve was not well visualized. Aortic valve regurgitation is not visualized. No aortic stenosis is present. Aortic valve mean gradient measures 3.0 mmHg. Aortic valve peak gradient measures 4.2 mmHg. Aortic valve area, by VTI measures 3.29 cm. Pulmonic Valve: The pulmonic valve was not well visualized. Pulmonic valve regurgitation is not visualized. No evidence of pulmonic stenosis. Aorta: The aortic root is normal in size and structure. Venous: The inferior vena cava is normal in size with greater than 50% respiratory variability, suggesting right atrial pressure of 3 mmHg. IAS/Shunts: No atrial level shunt detected by color flow Doppler.  LEFT VENTRICLE PLAX 2D LVIDd:         4.40 cm     Diastology LVIDs:         3.00 cm     LV e' medial:    6.53 cm/s LV PW:         0.90 cm     LV E/e' medial:  16.5 LV IVS:        0.80 cm     LV e' lateral:   9.25 cm/s  LVOT diam:     2.25 cm     LV E/e' lateral: 11.7 LV SV:         75 LV SV Index:   42 LVOT Area:     3.98 cm  LV Volumes (MOD) LV vol d, MOD A2C: 49.3 ml LV vol d, MOD A4C: 55.5 ml LV vol s, MOD A2C: 14.8 ml LV vol s, MOD A4C: 19.9 ml LV SV MOD A2C:     34.5 ml LV SV MOD A4C:     55.5 ml LV SV MOD BP:      35.7 ml RIGHT VENTRICLE RV S prime:     13.70 cm/s AORTIC VALVE AV Area (Vmax):    2.90 cm AV Area (Vmean):   2.95 cm AV Area (VTI):     3.29 cm AV Vmax:           103.00 cm/s AV Vmean:          77.600 cm/s AV VTI:            0.227 m AV Peak Grad:      4.2 mmHg AV Mean Grad:      3.0 mmHg LVOT Vmax:         75.00 cm/s LVOT Vmean:        57.600 cm/s LVOT VTI:          0.188 m LVOT/AV VTI ratio: 0.83 MITRAL VALVE MV Area (PHT): 6.96 cm     SHUNTS MV Decel Time: 109 msec     Systemic VTI:  0.19 m MV E velocity: 108.00 cm/s  Systemic Diam: 2.25 cm MV A velocity: 113.00 cm/s MV E/A ratio:  0.96 Ida Rogue MD Electronically signed by Ida Rogue MD Signature Date/Time: 12/24/2022/1:29:48 PM    Final    US Venous Img Lower Bilateral (DVT)  Result Date: 01/03/2023 CLINICAL DATA:  Evaluate for DVT. EXAM: BILATERAL LOWER EXTREMITY VENOUS DOPPLER ULTRASOUND TECHNIQUE: Gray-scale sonography with compression, as well as color and duplex ultrasound, were performed to evaluate the deep venous system(s) from the level of the common femoral vein through the popliteal and proximal calf veins. COMPARISON:  CT abdomen pelvis 12/31/2022 FINDINGS: VENOUS Normal compressibility of the common femoral, superficial  femoral, and popliteal veins, as well as the visualized calf veins. Visualized portions of profunda femoral vein and great saphenous vein unremarkable. No filling defects to suggest DVT on grayscale or color Doppler imaging. Doppler waveforms show normal direction of venous flow, normal respiratory plasticity and response to augmentation. Limited views of the contralateral common femoral vein are unremarkable.  OTHER None. Limitations: none IMPRESSION: Negative. Electronically Signed   By: Lovey Newcomer M.D.   On: 12/20/2022 09:50   CT Angio Chest PE W/Cm &/Or Wo Cm  Addendum Date: 12/04/2022   ADDENDUM REPORT: 12/12/2022 08:16 ADDENDUM: Critical Findings discussed by telephone with Dr. Cheri Fowler (now covering for Dr. Hinda Kehr ) on 01/02/2023 at 0807 hours. Electronically Signed   By: Genevie Ann M.D.   On: 12/30/2022 08:16   Result Date: 12/31/2022 CLINICAL DATA:  82 year old male with left upper quadrant pain, pleuritic chest pain. History of apical lung lesions, left apex compatible with primary lung neoplasm on PET-CT last year. Status post radiation treatment. EXAM: CT ANGIOGRAPHY CHEST WITH CONTRAST TECHNIQUE: Multidetector CT imaging of the chest was performed using the standard protocol during bolus administration of intravenous contrast. Multiplanar CT image reconstructions and MIPs were obtained to evaluate the vascular anatomy. RADIATION DOSE REDUCTION: This exam was performed according to the departmental dose-optimization program which includes automated exposure control, adjustment of the mA and/or kV according to patient size and/or use of iterative reconstruction technique. CONTRAST:  164mL OMNIPAQUE IOHEXOL 350 MG/ML SOLN COMPARISON:  Restaging chest CT 10/11/2022. CT Abdomen and Pelvis today reported separately. Portable chest radiographs 0608 hours today. FINDINGS: Cardiovascular: Excellent contrast bolus timing in the pulmonary arterial tree. Linear thrombus straddling the left lingula and lower lobe pulmonary artery bifurcation at the left hilum (series 4, image 179). Lingula arterial involvement appears occlusive on series 4, image 220. Left lower lobe pulmonary arteries are less affected. RV LV ratio 0.8 to. No superimposed saddle embolus. Left upper lobe pulmonary arteries appear relatively spared. No discrete right lung pulmonary embolus identified. Extensive Calcified aortic atherosclerosis. No  cardiomegaly or pericardial effusion. Calcified coronary artery atherosclerosis. Mediastinum/Nodes: No mediastinal lymphadenopathy. No left hilar lymphadenopathy. Right hilar soft tissue thickening involving some of the airways appears stable since November. Lungs/Pleura: Left lung apical nodularity and architectural distortion appears stable since November. Underlying emphysema. Spiculated multifocal left lower lobe anterior lung nodules persist but appears stable and size since November (series 3, images 77, 78, 87). No left pleural effusion. Mild architectural distortion in the left posterior costophrenic angle is stable. Part solid part cavitary right upper lobe masslike area has regressed since November (compare series 3, image 40 today is series 3, image 40 previously) but not resolved. Dominant partially cavitary 4 cm round lesion persists along the right major fissure. There is some regional dystrophic appearing calcification. Underlying emphysema. Regional airway thickening which continues to the right hilum is stable. Right lower lobe airways are also thickened. No right pleural effusion. No new right lung abnormality. Trachea and carina remain patent. Upper Abdomen: CT Abdomen and Pelvis today is reported separately. Musculoskeletal: Nondisplaced fractures of the left lateral 9th and 10th ribs are new since November (series 3, image 155) and appear to be posttraumatic. Other chest osseous structures appear stable. No destructive osseous lesion identified. Review of the MIP images confirms the above findings. IMPRESSION: 1. Positive for acute pulmonary embolus at the left lingula and lower lobe pulmonary artery bifurcation. Additional lingula thrombus appears occlusive. No superimposed saddle embolus or CTA evidence of right heart  strain. 2. No evidence of pulmonary infarct at this time. No pleural effusion. 3. Positive also for nondisplaced fractures of the left lateral 9th and 10th ribs, new since  November and appear to be posttraumatic. 4. Underlying advanced chronic lung disease, Emphysema (ICD10-J43.9): - treated left upper lobe stable since November restaging CT. - indeterminate, spiculated left lower lobe pulmonary nodules (series 3, images 73-87) were previously thought to be inflammatory but persist. Although no growth since November. Continued cancer surveillance recommended. - regressed but not resolved right upper lobe part solid, part cavitary lesion. If this has not been specifically treated then regression favors benign etiology. Associated generalized thickening of the right lung airways extending to the hilum appears stable. 5. CT Abdomen and Pelvis today is reported separately. 6.  Aortic Atherosclerosis (ICD10-I70.0). Electronically Signed: By: Genevie Ann M.D. On: 12/05/2022 08:03   CT Abdomen Pelvis W Contrast  Result Date: 12/31/2022 CLINICAL DATA:  82 year old male with left upper quadrant pain, pleuritic chest pain. History of apical lung lesions, left apex compatible with primary lung neoplasm on PET-CT last year. Status post radiation treatment. CTA chest positive for PE today. EXAM: CT ABDOMEN AND PELVIS WITH CONTRAST TECHNIQUE: Multidetector CT imaging of the abdomen and pelvis was performed using the standard protocol following bolus administration of intravenous contrast. RADIATION DOSE REDUCTION: This exam was performed according to the departmental dose-optimization program which includes automated exposure control, adjustment of the mA and/or kV according to patient size and/or use of iterative reconstruction technique. CONTRAST:  127mL OMNIPAQUE IOHEXOL 350 MG/ML SOLN COMPARISON:  Chest CTA today reported separately. PET-CT 03/31/2022. CT Abdomen and Pelvis 04/16/2013. FINDINGS: Lower chest: Detailed separately today. Hepatobiliary: Liver and gallbladder are within normal limits. Pancreas: Negative. Spleen: Negative. Adrenals/Urinary Tract: Normal adrenal glands. Both kidneys  enhance and excrete contrast symmetrically, with no evidence of hydronephrosis. Right lower pole elongated 8-9 mm calculus coronal image 62. Left-sided renal vascular calcifications but no obvious left nephrolithiasis. No hydronephrosis *CRASH* that no hydroureter or periureteral stranding. Both ureters appear decompressed into the pelvis. But in the urinary bladder just medial to the right UVJ there is a round 6 mm stone on series 4, image 73. However, this was present last year. Bladder otherwise unremarkable. Stomach/Bowel: Decompressed rectum. Severe sigmoid diverticulosis and moderate sigmoid redundancy in the pelvis. Severe diverticulosis continues throughout the descending colon to the splenic flexure. No active inflammation identified. Retained transverse colon gas and stool. Redundant right colon with cecum on a lax mesentery. Normal appendix in the right lower quadrant series 4, image 53. Decompressed terminal ileum. No active large bowel inflammation. No dilated small bowel. No free air or free fluid identified. Small volume gas and fluid in the stomach. Duodenum decompressed. No mesenteric inflammation identified. Vascular/Lymphatic: Severe Aortoiliac calcified atherosclerosis. Negative for abdominal aortic aneurysm. Major aortic branches in the abdomen remain patent. High-grade atherosclerotic stenosis of the bilateral iliac arteries. Chronic occlusion of the proximal right SFA, was present in 2014. High-grade stenosis of the proximal left SFA which was patent in 2014, and seems to remain patent now. Portal venous system is patent.  No lymphadenopathy identified. Reproductive: Prostate brachytherapy. Marginal inguinal hernias more so on the left. No overt herniated bowel. Other: No pelvis free fluid. Musculoskeletal: Left lateral 9th and 10th rib fractures now appear mildly displaced on these images, and fracture also of the left lateral 8th rib is apparent. These are new since November, and there is  mild associated left intercostal, chest wall hematoma there. No left lung  base pneumothorax or pleural effusion. No other acute or suspicious osseous lesion. IMPRESSION: 1. Recent posttraumatic appearing left lower rib fractures are seen to be ribs 8 through 10 on these images, mildly displaced and with mild intercostal/chest wall hematoma. See also abnormal Chest CTA reported separately. 2. No superimposed acute or inflammatory process identified in the abdomen or pelvis. 3. Advanced Aortic, aortoiliac Atherosclerosis (ICD10-I70.0). High-grade bilateral iliac artery stenosis, left superficial femoral artery stenosis, and chronically occluded right SFA. 4. Chronic right nephrolithiasis, and chronic 6 mm stone within the bladder, present on PET-CT last year. 5. Severe diverticulosis of distal large bowel. No active inflammation. Salient findings discussed by telephone with Dr. Cheri Fowler (now covering for Dr. Hinda Kehr) on 12/17/2022 at 0807 hours. Electronically Signed   By: Genevie Ann M.D.   On: 12/12/2022 08:15   DG Chest Portable 1 View  Result Date: 12/11/2022 CLINICAL DATA:  Left upper quadrant pain and pleuritic chest pain. EXAM: PORTABLE CHEST 1 VIEW COMPARISON:  CT chest 10/11/2022 FINDINGS: Heart size is normal. Aortic atherosclerosis. No signs of pleural effusion. Chronic interstitial coarsening. Right upper lobe opacity persistent right upper lobe cavitary process is identified which corresponds to findings on CT from 10/11/2022. Within the apical segment of the left upper lobe there is increase opacification with scarring and architectural distortion which corresponds to previous left upper lobe lung nodule and radiation change. IMPRESSION: 1. Persistent right upper lobe cavitary process. Previously characterized as likely reflecting sequelae of necrotic pneumonia or aspiration. 2. Similar opacification with scarring and architectural distortion within the left upper lobe corresponds to previous left  upper lobe lung nodule and radiation change. 3. Chronic interstitial coarsening. Electronically Signed   By: Kerby Moors M.D.   On: 12/11/2022 06:27    Microbiology: No results found for this or any previous visit (from the past 240 hour(s)).  Time spent: 15 minutes  Signed: Ezekiel Slocumb, DO 01/12/23

## 2023-01-04 NOTE — IPAL (Signed)
  Interdisciplinary Goals of Care Family Meeting   Date carried out: January 24, 2023  Location of the meeting: Unit  Member's involved: Physician, Nurse Practitioner, and Family Member or next of kin  Durable Power of Attorney or acting medical decision maker: Pts wife and daughter arrived at bedside per Dr. Zoila Shutter request   Discussion: We discussed goals of care for Parker Martinez .  Discussed decline in pts condition and goals of care.    Code status:   Code Status: DNR   Disposition: In-patient comfort care  Time spent for the meeting: 10 minutes     Donell Beers, [place] Pager [phone] (please enter 7 digits) [place] Pager [phone] (please enter 7 digits)

## 2023-01-04 NOTE — Progress Notes (Addendum)
Rapid Response Event Note   Reason for Call : Patient had desaturation event with bedside RN, patient desatted to 53s. Improved with HHFNC and nonrebreather mask prior to [place] arrival.    Initial Focused Assessment:  Patient obtunded in bed with rhonchorous breath sounds. Oxygen sats 93 to 96%. Respirations labored RR in the 30s. Confirmed code status of DNR/DNI. Patient minimally responsive to sternal rub but not opening eyes or localizing pain.     Interventions:  ABG obtained. Neomia Glass NP to bedside ordered X-Ray, BiPAP, and safety sitter. Oral suction completed with minimal yellow sputum improved breath sounds.    Plan of Care:  Given DNR/DNI code status will place patient on bipap and reassess to determine if mentation improves.   Event Summary:  Patient with rib fractures placed on high flow nasal cannula earlier due to concerns about ability to remove bipap with mittens on. Plan to place patient back on bipap with sitter at bedside.   MD Notified: Neomia Glass NP Call Time: [phone] Arrival Time: 3818 End Time: 0505  Paulina Fusi, RN

## 2023-01-04 NOTE — Progress Notes (Signed)
RAPID CALLED ON PATIENT DUE TO LOW SATS AND LABORED BREATHING. CRTICAL CO2 OF 77. PER NP PT PLACED BACK ON BIPAP WITH SITTER IN ROOM.

## 2023-01-04 NOTE — Progress Notes (Signed)
CROSS COVER NOTE  NAME: JAVIONE GUNAWAN MRN: [id number] DOB : 07-Feb-1941 ATTENDING PHYSICIAN: Loletha Grayer, MD    Date of Service   2023-01-10   HPI/Events of Note   Contacted by RT reporting Mr Covington is removing Bipap mask, nursing staff placed mitts on patient and he is now unable to remove bipap independently. Will trial heated high flow.  0440: Rapid response called for central cyanosis, SPO2 in 70s on Heated High Flow, increased work of breathing, and unresponsiveness.   HR 122 SPO2 97% RR 38 BP 136/89 MAP 104  On my evaluation Mr Enke is obtunded and minimally responsive when sternum rubbed. Pupils 3 mm equal, round, and reactive to light. S1 and S2 heart tones heard. Decreased air movement on (L) side. Upper and lower extremities are warm and well perfused bilaterally with +2 radial and DP pulses.  Wife Judson Roch updated and confirms DNR status.   8675: Increased Spontaneous movement noted on bedside re-evaluation. Mr Gallina still is not opening his eyes or following commands at this time. SPO2 90% on Bipap 15/5 and 35% FiO2.   Page received from [facility] with Hydropneumothorax seen on CXR.  Wife Judson Roch updated and wants chest tube placed.  Interventions   Assessment/Plan:  Acute on Chronic Hypercarbic and Hypoxic Respiratory Failure PMH COPD in setting of Hydropneumothorax  Abg - pH 7.23 pCo2 75 pO2 177 Bicarb 31.4, repeat ordered for 0700 Bipap with safety sitter at bedside due to mentation CXR --> Hydropneumothorax PCCM and Pulmonology consulted, transferred to [place]       To reach the provider On-Call:   7AM- 7PM see care teams to locate the attending and reach out to them via [email or web address] [email or web address] 7PM-7AM contact night-coverage If you still have difficulty reaching the appropriate provider, please page the [facility] (Director on Call) for [facility] on amion for assistance  This document was prepared using [employer] and may include unintentional dictation errors.  Neomia Glass DNP, MBA, FNP-BC Nurse Practitioner [facility] Pager [phone]

## 2023-01-04 DEATH — deceased

## 2023-01-16 ENCOUNTER — Telehealth: Payer: Self-pay | Admitting: Family Medicine

## 2023-01-16 NOTE — Telephone Encounter (Signed)
Spoke with wife to express my condolences.

## 2023-02-14 ENCOUNTER — Encounter: Payer: Self-pay | Admitting: Pulmonary Disease

## 2023-02-22 ENCOUNTER — Ambulatory Visit: Payer: PPO | Admitting: Pulmonary Disease

## 2023-04-16 ENCOUNTER — Other Ambulatory Visit: Payer: PPO

## 2023-04-23 ENCOUNTER — Ambulatory Visit: Payer: PPO | Admitting: Radiation Oncology

## 2023-06-04 ENCOUNTER — Encounter: Payer: Self-pay | Admitting: Pulmonary Disease
